# Patient Record
Sex: Female | Born: 1935 | Race: White | Hispanic: No | Smoking: Never smoker
Health system: Southern US, Community
[De-identification: ages and names within clinical notes are randomized; demographics above are authoritative.]

## PROBLEM LIST (undated history)

## (undated) DIAGNOSIS — N189 Chronic kidney disease, unspecified: Secondary | ICD-10-CM

## (undated) DIAGNOSIS — G629 Polyneuropathy, unspecified: Secondary | ICD-10-CM

## (undated) DIAGNOSIS — R413 Other amnesia: Secondary | ICD-10-CM

## (undated) DIAGNOSIS — I739 Peripheral vascular disease, unspecified: Secondary | ICD-10-CM

## (undated) DIAGNOSIS — I509 Heart failure, unspecified: Secondary | ICD-10-CM

## (undated) DIAGNOSIS — I4891 Unspecified atrial fibrillation: Secondary | ICD-10-CM

## (undated) DIAGNOSIS — J449 Chronic obstructive pulmonary disease, unspecified: Secondary | ICD-10-CM

## (undated) DIAGNOSIS — F329 Major depressive disorder, single episode, unspecified: Secondary | ICD-10-CM

## (undated) DIAGNOSIS — K219 Gastro-esophageal reflux disease without esophagitis: Secondary | ICD-10-CM

## (undated) DIAGNOSIS — R569 Unspecified convulsions: Secondary | ICD-10-CM

## (undated) DIAGNOSIS — M1990 Unspecified osteoarthritis, unspecified site: Secondary | ICD-10-CM

## (undated) DIAGNOSIS — I639 Cerebral infarction, unspecified: Secondary | ICD-10-CM

## (undated) DIAGNOSIS — F419 Anxiety disorder, unspecified: Secondary | ICD-10-CM

## (undated) DIAGNOSIS — W19XXXA Unspecified fall, initial encounter: Secondary | ICD-10-CM

## (undated) DIAGNOSIS — I10 Essential (primary) hypertension: Secondary | ICD-10-CM

## (undated) DIAGNOSIS — I959 Hypotension, unspecified: Secondary | ICD-10-CM

## (undated) DIAGNOSIS — N390 Urinary tract infection, site not specified: Secondary | ICD-10-CM

## (undated) DIAGNOSIS — E785 Hyperlipidemia, unspecified: Secondary | ICD-10-CM

## (undated) HISTORY — DX: Cerebral infarction, unspecified: I63.9

## (undated) HISTORY — PX: TONSILLECTOMY: SUR1361

## (undated) HISTORY — DX: Anxiety disorder, unspecified: F41.9

## (undated) HISTORY — DX: Polyneuropathy, unspecified: G62.9

## (undated) HISTORY — PX: FOOT NEUROMA SURGERY: SHX646

## (undated) HISTORY — PX: ABDOMINAL HYSTERECTOMY: SHX81

## (undated) HISTORY — DX: Essential (primary) hypertension: I10

## (undated) HISTORY — DX: Hyperlipidemia, unspecified: E78.5

## (undated) HISTORY — DX: Unspecified fall, initial encounter: W19.XXXA

## (undated) HISTORY — PX: SPINE SURGERY: SHX786

## (undated) HISTORY — DX: Hypotension, unspecified: I95.9

## (undated) HISTORY — DX: Urinary tract infection, site not specified: N39.0

## (undated) HISTORY — DX: Peripheral vascular disease, unspecified: I73.9

---

## 2015-05-26 LAB — BASIC METABOLIC PANEL
BUN: 45 mg/dL — AB (ref 4–21)
Creatinine: 2.4 mg/dL — AB (ref 0.5–1.1)
Glucose: 96 mg/dL
Potassium: 4.2 mmol/L (ref 3.4–5.3)
Sodium: 128 mmol/L — AB (ref 137–147)

## 2015-05-26 LAB — CBC AND DIFFERENTIAL
HCT: 29 % — AB (ref 36–46)
HEMOGLOBIN: 9.7 g/dL — AB (ref 12.0–16.0)
Neutrophils Absolute: 9 /uL
PLATELETS: 162 10*3/uL (ref 150–399)
WBC: 9.9 10^3/mL

## 2015-05-26 LAB — HEPATIC FUNCTION PANEL
ALT: 15 U/L (ref 7–35)
AST: 29 U/L (ref 13–35)
Alkaline Phosphatase: 99 U/L (ref 25–125)
Bilirubin, Total: 0.2 mg/dL

## 2015-05-26 LAB — POCT INR: INR: 1.1 (ref 0.9–1.1)

## 2015-08-04 ENCOUNTER — Non-Acute Institutional Stay (SKILLED_NURSING_FACILITY): Payer: Medicare Other | Admitting: Internal Medicine

## 2015-08-04 ENCOUNTER — Encounter: Payer: Self-pay | Admitting: Internal Medicine

## 2015-08-04 DIAGNOSIS — I480 Paroxysmal atrial fibrillation: Secondary | ICD-10-CM | POA: Insufficient documentation

## 2015-08-04 DIAGNOSIS — R1310 Dysphagia, unspecified: Secondary | ICD-10-CM | POA: Diagnosis not present

## 2015-08-04 DIAGNOSIS — R5381 Other malaise: Secondary | ICD-10-CM | POA: Diagnosis not present

## 2015-08-04 DIAGNOSIS — E46 Unspecified protein-calorie malnutrition: Secondary | ICD-10-CM

## 2015-08-04 DIAGNOSIS — K219 Gastro-esophageal reflux disease without esophagitis: Secondary | ICD-10-CM | POA: Diagnosis not present

## 2015-08-04 DIAGNOSIS — N289 Disorder of kidney and ureter, unspecified: Secondary | ICD-10-CM

## 2015-08-04 DIAGNOSIS — E785 Hyperlipidemia, unspecified: Secondary | ICD-10-CM | POA: Diagnosis not present

## 2015-08-04 DIAGNOSIS — I4891 Unspecified atrial fibrillation: Secondary | ICD-10-CM

## 2015-08-04 DIAGNOSIS — G8194 Hemiplegia, unspecified affecting left nondominant side: Secondary | ICD-10-CM | POA: Diagnosis not present

## 2015-08-04 DIAGNOSIS — M1712 Unilateral primary osteoarthritis, left knee: Secondary | ICD-10-CM

## 2015-08-04 DIAGNOSIS — I639 Cerebral infarction, unspecified: Secondary | ICD-10-CM

## 2015-08-04 DIAGNOSIS — I739 Peripheral vascular disease, unspecified: Secondary | ICD-10-CM

## 2015-08-04 DIAGNOSIS — E871 Hypo-osmolality and hyponatremia: Secondary | ICD-10-CM | POA: Diagnosis not present

## 2015-08-04 NOTE — Progress Notes (Signed)
LOCATION: [facility]  PCP: No primary care provider on file.   Code Status: DNR  Goals of care: Advanced Directive information Advanced Directives 08/04/2015  Does patient have an advance directive? Yes  Type of Advance Directive Out of facility DNR (pink MOST or yellow form)  Does patient want to make changes to advanced directive? No - Patient declined  Copy of advanced directive(s) in chart? Yes     Chief Complaint  Patient presents with  . New Admit To SNF    New Admission     HPI:  Patient is a 82 y.o. female seen today for short term rehabilitation post hospital admission and rehabilitation stay in [place] after an acute CVA with left sided hemiparesis. She has PMH of CAD, afib, HTN, right knee OA, PVD among others. She is seen in her room today with her daughter present. She complaints of being weak and tired.   Review of Systems:  Constitutional: Negative for fever, chills, diaphoresis. Energy level is slowly coming back. HENT: Negative for headache, congestion, nasal discharge, sore throat, difficulty swallowing. Wears hearing aids.   Eyes: Negative for blurred vision, double vision and discharge. Wears glasses. Respiratory: Negative for cough, shortness of breath and wheezing.   Cardiovascular: Negative for chest pain, palpitations, leg swelling.  Gastrointestinal: Negative for heartburn, nausea, vomiting, abdominal pain, melena, diarrhea and constipation. Last bowel movement was yesterday.  Genitourinary: Negative for dysuria and flank pain.  Musculoskeletal: Negative for back pain, fall in the facility. had falls in the other SNF during her stay.  Skin: Negative for itching, rash.  Neurological: Negative for dizziness. Psychiatric/Behavioral: Negative for depression   Past Medical History  Diagnosis Date  . Neuropathy ([place])   . HTN (hypertension)   . HLD (hyperlipidemia)   . PVD (peripheral vascular disease) ([place])   . Anxiety    History reviewed. No  pertinent past surgical history. Social History:   has no tobacco, alcohol, and drug history on file.  History reviewed. No pertinent family history.  Medications:   Medication List       This list is accurate as of: 08/04/15  1:24 PM.  Always use your most recent med list.               acetaminophen 325 MG tablet  Commonly known as:  TYLENOL  Take 650 mg by mouth every 4 (four) hours as needed for mild pain.     aspirin 81 MG chewable tablet  Chew 81 mg by mouth daily.     atorvastatin 80 MG tablet  Commonly known as:  LIPITOR  Take 80 mg by mouth daily.     carvedilol 12.5 MG tablet  Commonly known as:  COREG  Take 12.5 mg by mouth every 12 (twelve) hours.     digoxin 0.125 MG tablet  Commonly known as:  LANOXIN  Take 0.125 mg by mouth daily.     diltiazem 60 MG tablet  Commonly known as:  CARDIZEM  Take 60 mg by mouth every 8 (eight) hours.     doxazosin 1 MG tablet  Commonly known as:  CARDURA  Take 1 mg by mouth at bedtime.     famotidine 20 MG tablet  Commonly known as:  PEPCID  Take 20 mg by mouth every 12 (twelve) hours.        Immunizations: Immunization History  Administered Date(s) Administered  . PPD Test 08/01/2015     Physical Exam: Filed Vitals:   08/04/15 1309  BP: 136/74  Pulse: 70  Temp: 97.8 F (36.6 C)  TempSrc: Oral  Resp: 18  Weight: 112 lb (50.803 kg)  SpO2: 98%    General- elderly female, thin built, in no acute distress Head- normocephalic, atraumatic, hearing aids present Nose- no maxillary or frontal sinus tenderness, no nasal discharge Throat- moist mucus membrane Eyes- PERRLA, EOMI, no pallor, no icterus, no discharge, normal conjunctiva, normal sclera Neck- no cervical lymphadenopathy Cardiovascular- normal s1,s2, + murmur, trace leg edema Respiratory- bilateral clear to auscultation, no wheeze, no rhonchi, no crackles, no use of accessory muscles Abdomen- bowel sounds present, soft, non  tender Musculoskeletal- able to move all 4 extremities, left sided weakness more prominent in LUE > LLE, left knee OA with limited left knee ROM Neurological-  alert and oriented to person, place and time, left foot drop Skin- warm and dry Psychiatry- normal mood and affect    Labs reviewed: Basic Metabolic Panel:  Recent Labs  05/26/15  NA 128*  K 4.2  BUN 45*  CREATININE 2.4*   Liver Function Tests:  Recent Labs  05/26/15  AST 29  ALT 15  ALKPHOS 99   No results for input(s): LIPASE, AMYLASE in the last 8760 hours. No results for input(s): AMMONIA in the last 8760 hours. CBC:  Recent Labs  05/26/15  WBC 9.9  NEUTROABS 9  HGB 9.7*  HCT 29*  PLT 162    Radiological Exams: Ct Head Wo Contrast 05/27/15 Impression. Questionable area of low attenuation involving the territory of the left PCA. Recommended MRI for further evaluation. The possibility of early changes of ischemia could not be completely excluded. Examination severely compromised by motion and beam hardening artifact. Severe microvascular disease.  Xr Mobile Chest 05/27/15 Impression. There is an improved ad subtle remaining at the lower right chest. The findings are consistent with chronic rotator cuff tear at the right shoulder and secondary bone remodeling and osteophytic change.    Assessment/Plan  Physical deconditioning Will have her work with physical therapy and occupational therapy team to help with gait training and muscle strengthening exercises.fall precautions. Skin care. Encourage to be out of bed.   Acute CVA with left sided hemiparesis Persists but improving per family, Will have patient work with PT/OT as tolerated to regain strength and restore function.  Fall precautions are in place. Continue aspirin 81 mg daily and atorvastatin 80 mg daily. Get neurology follow up  HLD Check lipid panel, continue atorvastatin 80 mg daily  left knee OA Severe OA. Get orthopedic consult. D/c current  tylenol order. Start tylenol 500 mg 2 tab bid and monitor. Will need AFO brace for right knee as noted in her OV note.   Hyponatremia Monitor bmp  Protein calorie malnutrition Get dietary consult for now, monitor weekly weight  Dysphagia Get SLP consult, to provide assistance with meals  afib Rate controlled. Continue diltiazem 60 mg tid, carvedilol 12.5 mg bid and digoxin 125 mcg daily, check digoxin level  gerd Stable, change famotidine to 20 mg daily from bid  HTN Stable bp, continue carvedilol and doxazosin, monitor BP and check bmp  Impaired renal function Monitor bmp, off losartan at present  PVD Was on petoxifylline before, unclear reason for stopping her med, will need medical records from SNF to evaluate further    Goals of care: short term rehabilitation   Labs/tests ordered: cbc, cmp, tsh, lipid panel, digoxin level  Family/ staff Communication: reviewed care plan with patient, her daughter and nursing supervisor  Blanchie Serve, MD Internal Medicine [facility] [address] [place], [place] [address] Cell Phone (Monday-Friday 8 am - 5 pm): [phone] On Call: [phone] and follow prompts after 5 pm and on weekends Office Phone: [phone] Office Fax: [phone]

## 2015-08-11 ENCOUNTER — Non-Acute Institutional Stay (SKILLED_NURSING_FACILITY): Payer: Medicare Other | Admitting: Internal Medicine

## 2015-08-11 ENCOUNTER — Encounter: Payer: Self-pay | Admitting: Internal Medicine

## 2015-08-11 DIAGNOSIS — L600 Ingrowing nail: Secondary | ICD-10-CM

## 2015-08-11 DIAGNOSIS — M2042 Other hammer toe(s) (acquired), left foot: Secondary | ICD-10-CM | POA: Diagnosis not present

## 2015-08-11 DIAGNOSIS — M2041 Other hammer toe(s) (acquired), right foot: Secondary | ICD-10-CM | POA: Diagnosis not present

## 2015-08-11 DIAGNOSIS — L602 Onychogryphosis: Secondary | ICD-10-CM | POA: Diagnosis not present

## 2015-08-11 NOTE — Progress Notes (Signed)
LOCATION: [facility]  PCP: No primary care provider on file.   Code Status: DNR  Goals of care: Advanced Directive information Advanced Directives 08/04/2015  Does patient have an advance directive? Yes  Type of Advance Directive Out of facility DNR (pink MOST or yellow form)  Does patient want to make changes to advanced directive? No - Patient declined  Copy of advanced directive(s) in chart? Yes     Chief Complaint  Patient presents with  . Acute Visit    Foot concerns per daughter     HPI:  Patient is a 82 y.o. female seen today for acute visit. Her daughter would like her feet evaluated as she is complaining of pain to her toe. She was seeing a podiatrist in [place]. She is here for short term rehabilitation post hospital admission and rehabilitation stay in [place] after an acute CVA with left sided hemiparesis.   Review of Systems:  Constitutional: Negative for fever HENT: Negative for headache, congestion, nasal discharge Respiratory: Negative for cough, shortness of breath Cardiovascular: Negative for chest pain Musculoskeletal: Negative for fall in the facility. has PVD  Skin: Negative for itching, rash.  Neurological: Negative for numbness or tingling to her feet   Past Medical History  Diagnosis Date  . Neuropathy ([place])   . HTN (hypertension)   . HLD (hyperlipidemia)   . PVD (peripheral vascular disease) ([place])   . Anxiety     Medications:   Medication List       This list is accurate as of: 08/11/15  3:43 PM.  Always use your most recent med list.               acetaminophen 500 MG tablet  Commonly known as:  TYLENOL  Take 1,000 mg by mouth 2 (two) times daily.     aspirin 81 MG chewable tablet  Chew 81 mg by mouth daily.     atorvastatin 80 MG tablet  Commonly known as:  LIPITOR  Take 80 mg by mouth daily.     BIOFREEZE 4 % Gel  Generic drug:  Menthol (Topical Analgesic)  Apply 1 application topically 2 (two) times daily. Left  knee for pain     carvedilol 12.5 MG tablet  Commonly known as:  COREG  Take 12.5 mg by mouth every 12 (twelve) hours.     digoxin 0.125 MG tablet  Commonly known as:  LANOXIN  Take 0.125 mg by mouth daily.     diltiazem 60 MG tablet  Commonly known as:  CARDIZEM  Take 60 mg by mouth every 8 (eight) hours.     doxazosin 1 MG tablet  Commonly known as:  CARDURA  Take 1 mg by mouth at bedtime.     famotidine 20 MG tablet  Commonly known as:  PEPCID  Take 20 mg by mouth daily.     gabapentin 600 MG tablet  Commonly known as:  NEURONTIN  Take 600 mg by mouth daily. 600 mg 2 tabs= 1200 mg in the evening     pentoxifylline 400 MG CR tablet  Commonly known as:  TRENTAL  Take 400 mg by mouth 2 (two) times daily.         Physical Exam: Filed Vitals:   08/11/15 1537  BP: 124/50  Pulse: 60  Temp: 96.9 F (36.1 C)  TempSrc: Oral  Resp: 18  Weight: 112 lb (50.803 kg)  SpO2: 97%    General- elderly female, thin built, in no acute distress Neck-  no cervical lymphadenopathy Cardiovascular- normal s1,s2, + murmur, trace leg edema Respiratory- bilateral clear to auscultation, no wheeze, no rhonchi, no crackles, no use of accessory muscles Musculoskeletal- able to move all 4 extremities, left sided weakness more prominent in LUE > LLE, left knee OA with limited left knee ROM Neurological-  alert and oriented to person, place and time, left foot drop with brace + Skin- warm and dry Toes- has ingrown toe nail to right great toe and has thickened toe nails. Has hammer toes Psychiatry- normal mood and affect    Labs reviewed: Basic Metabolic Panel:  Recent Labs  05/26/15  NA 128*  K 4.2  BUN 45*  CREATININE 2.4*   Liver Function Tests:  Recent Labs  05/26/15  AST 29  ALT 15  ALKPHOS 99   No results for input(s): LIPASE, AMYLASE in the last 8760 hours. No results for input(s): AMMONIA in the last 8760 hours. CBC:  Recent Labs  05/26/15  WBC 9.9  NEUTROABS 9   HGB 9.7*  HCT 29*  PLT 162    Radiological Exams: Ct Head Wo Contrast 05/27/15 Impression. Questionable area of low attenuation involving the territory of the left PCA. Recommended MRI for further evaluation. The possibility of early changes of ischemia could not be completely excluded. Examination severely compromised by motion and beam hardening artifact. Severe microvascular disease.  Xr Mobile Chest 05/27/15 Impression. There is an improved ad subtle remaining at the lower right chest. The findings are consistent with chronic rotator cuff tear at the right shoulder and secondary bone remodeling and osteophytic change.    Assessment/Plan  Hammer toes Get podiatry consult  Ingrown toe nail No signs of infection at present, get podiatry consult  Hypertrophied toe nails Get podiatry consult for foot care   Family/ staff Communication: reviewed care plan with patient and nursing supervisor    Blanchie Serve, MD Internal Medicine [place], [place] [address] Cell Phone (Monday-Friday 8 am - 5 pm): [phone] On Call: [phone] and follow prompts after 5 pm and on weekends Office Phone: [phone] Office Fax: [phone]

## 2015-08-14 ENCOUNTER — Encounter: Payer: Self-pay | Admitting: Neurology

## 2015-08-14 ENCOUNTER — Ambulatory Visit (INDEPENDENT_AMBULATORY_CARE_PROVIDER_SITE_OTHER): Payer: Medicare Other | Admitting: Neurology

## 2015-08-14 VITALS — BP 169/64 | HR 52 | Temp 99.4°F | Ht 60.0 in

## 2015-08-14 DIAGNOSIS — I639 Cerebral infarction, unspecified: Secondary | ICD-10-CM

## 2015-08-14 DIAGNOSIS — G629 Polyneuropathy, unspecified: Secondary | ICD-10-CM | POA: Diagnosis not present

## 2015-08-14 NOTE — Patient Instructions (Signed)
As far as your medications are concerned, I would like to suggest: Increase night dose of neurontin to 900mg   Our phone number is [phone]. We also have an after hours call service for urgent matters and there is a physician on-call for urgent questions. For any emergencies you know to call 911 or go to the nearest emergency room

## 2015-08-14 NOTE — Progress Notes (Signed)
[facility]    Provider:  Dr Jaynee Eagles Referring Provider: No ref. provider found Primary Care Physician:  Blanchie Serve, MD  CC:  Previous CVA  HPI:  [facility] is a 82 y.o. female here as a referral for remote CVA. PMHx CVA, HTN, Left hemiparesis, A-fib, CAD, PN and PVD. On ASA 81mg  sequelae from stroke includes left hemiparesis, contractures, left foot drop, weakness. Stroke was 2 months ago, went to the ED in [facility], for dizziness, She was admitted and then had a stoke that evening. She was given TPA. She moved here to a nursing facility to be close to her daughters. She was in the hospital for 2 weeks in serious condition. She couldn't move anything on the left but the left side is improving. She also had a facial droop. I don't have any details from her hospitalization, no details on the stroke but will request all records. She is in therapy. She can't walk, she can stand with assistance. No swallowing problems anymore, no aphasia or dysarthria. She forgets there are things on the left, possibly some neglect. No new sensory changes with the stroke. She has had terrible neuropathy in the feet for many years unclear diagnosis or why. She has been having memory problems more short-term memory and reasoning since the stroke. She is incontinent but this is not new. She used to be on Eliquis, She is unsure when she stopped it or when or why she was on it or when she was diagnosed with afib. Need records. Daughter is here with her and provides most information. Daughter went to the [facility] enrichment and then lived in the [place] area but now he is hearing [facility] and patient is here to be near her daughter. Her neuropathy is most painful at night while she is in bed.  Reviewed notes, labs and imaging from outside physicians, which showed: Reviewed records provided by nursing facility. Patient is on aspirin 81 mg, no anticoagulation is being given to her for her atrial  fibrillation. She is on gabapentin 600 mg for neuropathy. Patient has hemiplegia and hemiparesis following unspecified cerebrovascular disease affecting left nondominant side. She was hospitalized for acute CVA given TPA. Has left hemiparesis, contracture management, left foot drop, needs rehabilitation for strengthening mobility, monitor for pain and muscle spasms, on ASA 81 mg by mouth daily.  Review of Systems: Patient complains of symptoms per HPI as well as the following symptoms: Hearing loss, incontinence, joint pain, feeling cold, itching, incontinence. Pertinent negatives per HPI. All others negative.   Social History   Social History  . Marital Status: Widowed    Spouse Name: N/A  . Number of Children: 4  . Years of Education: 12   Occupational History  . Retired    Social History Main Topics  . Smoking status: Not on file  . Smokeless tobacco: Not on file  . Alcohol Use: Not on file  . Drug Use: Not on file  . Sexual Activity: Not on file   Other Topics Concern  . Not on file   Social History Narrative   Lives at [facility]   Caffeine use: 1 cup tea/day   1 cup coffee/day    Family History  Problem Relation Age of Onset  . Heart attack Brother   . Stroke Neg Hx   . Neuropathy Neg Hx     Past Medical History  Diagnosis Date  . Neuropathy ([place])   . HTN (hypertension)   . HLD (  hyperlipidemia)   . PVD (peripheral vascular disease) ([place])   . Anxiety   . Stroke [facility])     No past surgical history on file.  Current Outpatient Prescriptions  Medication Sig Dispense Refill  . acetaminophen (TYLENOL) 500 MG tablet Take 500 mg by mouth 2 (two) times daily. Left knee    . aspirin 81 MG chewable tablet Chew 81 mg by mouth daily. Chewable tablet    . atorvastatin (LIPITOR) 80 MG tablet Take 80 mg by mouth daily.    . carvedilol (COREG) 12.5 MG tablet Take 12.5 mg by mouth every 12 (twelve) hours.    . digoxin (LANOXIN) 0.125 MG tablet Take 0.125 mg by  mouth daily.    Marland Kitchen diltiazem (CARDIZEM) 60 MG tablet Take 60 mg by mouth every 8 (eight) hours.    Marland Kitchen doxazosin (CARDURA) 1 MG tablet Take 1 mg by mouth at bedtime.    . famotidine (PEPCID) 20 MG tablet Take 20 mg by mouth daily.     Marland Kitchen gabapentin (NEURONTIN) 600 MG tablet Take 600 mg by mouth daily. 1 tablet in am for neuropathy 2 tablets in the pm    . Menthol, Topical Analgesic, (BIOFREEZE) 4 % GEL Apply 1 application topically 2 (two) times daily. Left knee for pain    . pentoxifylline (TRENTAL) 400 MG CR tablet Take 400 mg by mouth 2 (two) times daily.     No current facility-administered medications for this visit.    Allergies as of 08/14/2015  . (No Known Allergies)    Vitals: BP 169/64 mmHg  Pulse 52  Temp(Src) 99.4 F (37.4 C) (Oral)  Ht 5' (1.524 m) Last Weight:  Wt Readings from Last 1 Encounters:  08/11/15 112 lb (50.803 kg)   Last Height:   Ht Readings from Last 1 Encounters:  08/14/15 5' (1.524 m)   Physical exam: Exam: Gen: NAD, conversant, well nourised                    CV: RRR, no MRG. No Carotid Bruits. No peripheral edema, warm, nontender Eyes: Conjunctivae clear without exudates or hemorrhage  Neuro: Detailed Neurologic Exam  Speech:    Speech is normal; fluent and spontaneous with normal comprehension.  Cognition:    The patient is oriented to person, place, and time;     recent and remote memory Impaired;     language fluent;     Impaired attention, concentration,     fund of knowledge impaired Cranial Nerves:    The pupils are equal, round, and reactive to light.  Attempted funduscopic exam could not visualize due to small pupils. Visual fields are full to finger confrontation. Extraocular movements are intact. Trigeminal sensation is impaired on the left and the muscles of mastication are normal. Lower left facial weakness. The palate elevates in the midline. Hearing impaired. Voice is normal. Shoulder shrug is normal. The tongue has normal  motion without fasciculations.   Coordination: Impaired with the left arm and left leg. Intact on the right. Dec fine motor on the left esp left hand.   Gait:    Can stand briefly with assistance.   Motor Observation:    No asymmetry, no atrophy, and no involuntary movements noted. Tone:    Normal muscle tone.  No spasticity  Posture:    Posture stopped in the wheelachair.     Strength: Left pronator drift. Left hemi[paresiss that is not too different from the right side. Right is 4-4+/4 and the left  side is 3+-4/5.        Sensation: Left hemiparesis and significant distal dec in sensation due to polyneuropathy.      Reflex Exam:  DTR's:    Deep tendon reflexes in the upper and lower extremities are brisk for age and medical conditions, left greater than the right .   Toes:    The toes are downgoing bilaterally.   Clonus:    Clonus is absent.       Assessment/Plan:  82 y.o. female here as a referral for remote CVA. PMHx CVA, HTN, Left hemiparesis, A-fib, CAD, PN and PVD. On ASA 81mg  sequelae from stroke includes left hemiparesis, contractures, left foot drop, weakness. Stroke was 2 months ago. Unfortunately have a very limited information on her stroke, and it have any imaging reports, no hospital records, no idea when she was on eloquent is for her atrial fibrillation, if she was taken off of it or if she was supposed to restart it. Daughter doesn't have much information either, stroke happened in [place] and her brother is the one who lives there. I'll need to request all records from [facility] before making any suggestions or modifications to her medications. She also has severe distal peripheral neuropathy, patient has no idea if she is ever been worked up for it or why she has it. No edema but I can do at this point is increase her Neurontin to 900 mg daily at bedtime.  - Increased Neurontin to 900 mg daily at bedtime for painful peripheral neuropathy - I have limited  information on her stroke and atrial fibrillation, need to request all notes from [facility] hospitalization including imaging reports and hospital notes as well as her cardiologist records. - I have no information on her apparently severe distal neuropathy, at this point we will request records from her previous internist in [place], [facility] [address] [place], [place] [address]  Phone [phone] Fax [phone]  A total of 45 minutes was spent in with this patient and daughter face-to-face. Over half this time was spent on counseling patient on the stroke and neuropathy diagnosis and different therapeutic options available.

## 2015-08-16 ENCOUNTER — Encounter: Payer: Self-pay | Admitting: Neurology

## 2015-08-18 ENCOUNTER — Encounter: Payer: Self-pay | Admitting: *Deleted

## 2015-08-18 NOTE — Progress Notes (Signed)
Faxed Dr Cathren Laine recent office visit note to [facility]. Fax [phone]. Received fax confirmation.

## 2015-08-21 ENCOUNTER — Non-Acute Institutional Stay (SKILLED_NURSING_FACILITY): Payer: Medicare Other | Admitting: Family

## 2015-08-21 ENCOUNTER — Encounter: Payer: Self-pay | Admitting: Family

## 2015-08-21 DIAGNOSIS — E785 Hyperlipidemia, unspecified: Secondary | ICD-10-CM | POA: Diagnosis not present

## 2015-08-21 DIAGNOSIS — M25562 Pain in left knee: Secondary | ICD-10-CM

## 2015-08-21 DIAGNOSIS — I482 Chronic atrial fibrillation, unspecified: Secondary | ICD-10-CM

## 2015-08-21 DIAGNOSIS — K219 Gastro-esophageal reflux disease without esophagitis: Secondary | ICD-10-CM | POA: Diagnosis not present

## 2015-08-21 DIAGNOSIS — I10 Essential (primary) hypertension: Secondary | ICD-10-CM | POA: Insufficient documentation

## 2015-08-21 DIAGNOSIS — M1712 Unilateral primary osteoarthritis, left knee: Secondary | ICD-10-CM

## 2015-08-21 DIAGNOSIS — G8194 Hemiplegia, unspecified affecting left nondominant side: Secondary | ICD-10-CM

## 2015-08-21 DIAGNOSIS — R269 Unspecified abnormalities of gait and mobility: Secondary | ICD-10-CM

## 2015-08-21 DIAGNOSIS — I739 Peripheral vascular disease, unspecified: Secondary | ICD-10-CM

## 2015-08-21 DIAGNOSIS — G8929 Other chronic pain: Secondary | ICD-10-CM

## 2015-08-21 NOTE — Progress Notes (Addendum)
Location:    [facility] Room Number: [phone] Place of Service:  SNF [phone])  Provider: Marlowe Sax FNP-C  PCP: Blanchie Serve, MD Patient Care Team: Blanchie Serve, MD as PCP - General (Internal Medicine)  Extended Emergency Contact Information Primary Emergency Contact: [name]  [place] Phone: [phone] Relation: Daughter Secondary Emergency Contact: Reather L[place] Phone: [phone] Relation: Son  Code Status: DNR Goals of care:  Advanced Directive information Advanced Directives 08/21/2015  Does patient have an advance directive? Yes  Type of Advance Directive Out of facility DNR (pink MOST or yellow form)  Does patient want to make changes to advanced directive? No - Patient declined  Copy of advanced directive(s) in chart? Yes     No Known Allergies  Chief Complaint  Patient presents with  . Discharge Note    HPI:  82 y.o. female seen today at  [facility] for discharge to ALF. She was here for short term rehabilitation post hospital admission and rehabilitation stay in [place] after an acute CVA with left sided hemiparesis. She has a medical history of  HTN,CAD, Hyperlipidemia, Afib, right knee OA, PVD, Peripheral Neuropathy  among others.She is seen in her room today with her daughter at bedside. She states no acute issues this visit except for her chronic burning, tingling and numbness of her feet. She has worked with PT/OT now stable for discharge but requires assistance due to hemiparesis.She will be discharged to ALF for assistance with ADL's and safety precautions. She requires DME hemi Wheelchair with removable arm rests, seat cushion, extended brake handles and anti-tippers to allow her to maintain current level of independence with ADL's.She will also require a Hospital bed with 1/2 rails to assist with elevating lower extremities and repositioning in bed due  to knee pain secondary to osteoarthritis.  DME will be be arranged by facility social worker prior to discharge. She will be discharge with Prescription medication from facility.Facility staff report no new concerns.      Past Medical History  Diagnosis Date  . Neuropathy ([place])   . HTN (hypertension)   . HLD (hyperlipidemia)   . PVD (peripheral vascular disease) ([place])   . Anxiety   . Stroke [facility])     History reviewed. No pertinent past surgical history.    reports that she has never smoked. She does not have any smokeless tobacco history on file. Her alcohol and drug histories are not on file. Social History   Social History  . Marital Status: Widowed    Spouse Name: N/A  . Number of Children: 4  . Years of Education: 12   Occupational History  . Retired    Social History Main Topics  . Smoking status: Never Smoker   . Smokeless tobacco: Not on file  . Alcohol Use: Not on file  . Drug Use: Not on file  . Sexual Activity: Not on file   Other Topics Concern  . Not on file   Social History Narrative   Lives at [facility] and rehab (Fax: [phone])   Caffeine use: 1 cup tea/day   1 cup coffee/day   Functional Status Survey:    No Known Allergies  Pertinent  Health Maintenance Due  Topic Date Due  . DEXA SCAN  02/06/1996  . PNA vac Low Risk Adult (1 of 2 - PCV13) 02/06/1996  . INFLUENZA VACCINE  09/23/2015    Medications:  Medication List       This list is accurate as of: 08/21/15 12:09 PM.  Always use your most recent med list.               acetaminophen 500 MG tablet  Commonly known as:  TYLENOL  Take 500 mg by mouth 2 (two) times daily. Left knee     aspirin 81 MG chewable tablet  Chew 81 mg by mouth daily. Chewable tablet     atorvastatin 80 MG tablet  Commonly known as:  LIPITOR  Take 80 mg by mouth daily.     BIOFREEZE 4 % Gel  Generic drug:  Menthol (Topical Analgesic)  Apply 1 application topically 2 (two) times daily. Left knee  for pain     carvedilol 12.5 MG tablet  Commonly known as:  COREG  Take 12.5 mg by mouth every 12 (twelve) hours.     digoxin 0.125 MG tablet  Commonly known as:  LANOXIN  Take 0.125 mg by mouth daily.     diltiazem 60 MG tablet  Commonly known as:  CARDIZEM  Take 60 mg by mouth every 8 (eight) hours.     doxazosin 1 MG tablet  Commonly known as:  CARDURA  Take 1 mg by mouth at bedtime.     famotidine 20 MG tablet  Commonly known as:  PEPCID  Take 20 mg by mouth daily.     gabapentin 600 MG tablet  Commonly known as:  NEURONTIN  Take 600 mg by mouth daily. 1 tablet in am for neuropathy 2 tablets in the pm     pentoxifylline 400 MG CR tablet  Commonly known as:  TRENTAL  Take 400 mg by mouth 2 (two) times daily.        Review of Systems  Constitutional: Negative for fever, chills, activity change and appetite change.  HENT: Negative for congestion, rhinorrhea, sinus pressure, sneezing and sore throat.   Eyes: Negative.   Respiratory: Negative for cough, chest tightness, shortness of breath and wheezing.   Cardiovascular: Negative for chest pain, palpitations and leg swelling.  Gastrointestinal: Negative for nausea, vomiting, abdominal pain, diarrhea, constipation and abdominal distention.  Endocrine: Negative.   Genitourinary: Negative for dysuria, urgency, frequency and flank pain.  Musculoskeletal: Positive for gait problem.       Knee pain   Skin: Negative.   Neurological: Negative for dizziness, seizures, light-headedness and headaches.       Left side weakness   Psychiatric/Behavioral: Negative for hallucinations, confusion, sleep disturbance and agitation.    Filed Vitals:   08/21/15 1207  BP: 129/47  Pulse: 61  Temp: 97.1 F (36.2 C)  Resp: 20  Height: 5' (1.524 m)  Weight: 112 lb (50.803 kg)  SpO2: 97%   Body mass index is 21.87 kg/(m^2). Physical Exam  Constitutional: She appears well-developed and well-nourished. No distress.  HENT:  Head:  Normocephalic.  Mouth/Throat: Oropharynx is clear and moist. No oropharyngeal exudate.  Eyes: Conjunctivae and EOM are normal. Pupils are equal, round, and reactive to light. Right eye exhibits no discharge. Left eye exhibits no discharge. No scleral icterus.  Neck: Normal range of motion. No JVD present. No thyromegaly present.  Cardiovascular: Normal rate and intact distal pulses.  Exam reveals no gallop and no friction rub.   Murmur heard. Pulmonary/Chest: Effort normal and breath sounds normal. No respiratory distress. She has no wheezes. She has no rales.  Abdominal: Soft. Bowel sounds are normal. She exhibits no distension. There is no  tenderness. There is no rebound and no guarding.  Musculoskeletal: She exhibits no tenderness.  Left side weakness. Limited ROM to left knee due to pain. Left AFO in place.   Lymphadenopathy:    She has no cervical adenopathy.  Neurological: She is alert.  Skin: Skin is warm and dry. No rash noted. No erythema. No pallor.  Psychiatric: She has a normal mood and affect.    Labs reviewed: Basic Metabolic Panel:  Recent Labs  05/26/15  NA 128*  K 4.2  BUN 45*  CREATININE 2.4*   Liver Function Tests:  Recent Labs  05/26/15  AST 29  ALT 15  ALKPHOS 99   No results for input(s): LIPASE, AMYLASE in the last 8760 hours. No results for input(s): AMMONIA in the last 8760 hours. CBC:  Recent Labs  05/26/15  WBC 9.9  NEUTROABS 9  HGB 9.7*  HCT 29*  PLT 162   Cardiac Enzymes: No results for input(s): CKTOTAL, CKMB, CKMBINDEX, TROPONINI in the last 8760 hours. BNP: Invalid input(s): POCBNP CBG: No results for input(s): GLUCAP in the last 8760 hours.  Procedures and Imaging Studies During Stay: No results found.  Assessment/Plan:   Left Hemiparesis Status post short term rehabilitation post hospital admission 05/26/2015-06/11/2015 and rehabilitation stay in [place] after an acute CVA with left sided hemiparesis. She has worked with PT/OT  for muscle strengthening, ROM and Exercise.Continue on ASA 81 mg Tablet and Atorvastatin 80 mg Tablet. Fall and safety precautions. Continue to Assist with ADL's. She will discharge to ALF for assistance and safety precautions.   PVD Pentoxifylline 400 mg CR Tablet recently initiated 08/08/2015. Continue to monitor.   Afib Continue on Digoxin 125 mcg tablet.   GERD Stable. Continue on famotidine 20 mg tablet  OA Worse on left knee. Continue on Biofreeze 4 % gel twice daily and Tylenol. Seen by Sinclair Grooms 08/18/2015 follow up PRN   Hyperlipidemia Continue on Atorvastatin 80 mg Tablet. Monitor Lipid panel.   HTN B/p stable. Continue diltiazem 60 mg tablet and Coreg 12.5 mg tablet.   Abnormal Gait  She has worked with PT/OT for muscle strengthening, ROM and Exercise.Ordering DME hemi Wheelchair with removable arm rests, seat cushion, extended brake handles and anti-tippers to allow her to maintain current level of independence with ADL's.   Left Knee pain  Continue with Tylenol PRN and Biofreeze 4 % gel. Hospital bed with 1/2 rails to assist with elevating lower extremities and repositioning in bed due to knee pain secondary to osteoarthritis.   Patient is being discharged with the following durable medical equipment:   Hemi Wheelchair with removable arm rests, seat cushion, extended brake handles and anti-tippers   Patient has been advised to f/u with their PCP in 1-2 weeks to bring them up to date on their rehab stay.  Social services at facility was responsible for arranging this appointment.  Pt was provided with a 30 day supply of prescriptions for medications and refills must be obtained from their PCP.  For controlled substances, a more limited supply may be provided adequate until PCP appointment only.  Future labs/tests needed:  CBC, BMP with ALF PCP

## 2015-08-22 ENCOUNTER — Ambulatory Visit (INDEPENDENT_AMBULATORY_CARE_PROVIDER_SITE_OTHER): Payer: Medicare Other | Admitting: Podiatry

## 2015-08-22 ENCOUNTER — Encounter: Payer: Self-pay | Admitting: Podiatry

## 2015-08-22 VITALS — BP 184/70 | HR 60 | Resp 12

## 2015-08-22 DIAGNOSIS — B351 Tinea unguium: Secondary | ICD-10-CM

## 2015-08-22 DIAGNOSIS — I639 Cerebral infarction, unspecified: Secondary | ICD-10-CM | POA: Diagnosis not present

## 2015-08-22 DIAGNOSIS — M79604 Pain in right leg: Secondary | ICD-10-CM

## 2015-08-22 DIAGNOSIS — M79676 Pain in unspecified toe(s): Secondary | ICD-10-CM | POA: Diagnosis not present

## 2015-08-22 DIAGNOSIS — M79605 Pain in left leg: Secondary | ICD-10-CM

## 2015-08-22 NOTE — Progress Notes (Signed)
   Subjective:    Patient ID: Virginia Meyer, female    DOB: 11-11-1928, 82 y.o.   MRN: [id number]  HPI   PT REQUESTING FOR TOENAILS TRIM.  Review of Systems  Skin: Positive for color change.       Objective:   Physical Exam        Assessment & Plan:

## 2015-08-22 NOTE — Progress Notes (Signed)
Subjective:     Patient ID: Virginia Meyer, female   DOB: 02/14/1929, 82 y.o.   MRN: [id number]  HPI patient presents and poor health and wheelchair with incurvated nailbeds 1-5 both feet that are impossible for her to take care of that become painful   Review of Systems  All other systems reviewed and are negative.      Objective:   Physical Exam  Constitutional: She is oriented to person, place, and time.  Cardiovascular: Intact distal pulses.   Musculoskeletal: Normal range of motion.  Neurological: She is oriented to person, place, and time.  Skin: Skin is warm and dry.  Nursing note and vitals reviewed.  neurovascular status found to be moderately diminished but intact with mild edema in the ankle region bilateral secondary to nonweightbearing with reduced range of motion the subtalar midtarsal joint bilateral. Patient's found to have incurvated nailbeds 1-5 both feet that are thick yellow brittle and they're painful in the corner secondary to deformity and elongation     Assessment:     At risk patient with mycotic nail infections 1-5 both feet    Plan:     H&P conditions reviewed and debrided nailbeds 1-5 both feet with no iatrogenic bleeding noted

## 2015-09-01 ENCOUNTER — Telehealth: Payer: Self-pay | Admitting: Neurology

## 2015-09-01 NOTE — Telephone Encounter (Signed)
Receive records today notes on Virginia Meyer.

## 2015-09-01 NOTE — Telephone Encounter (Signed)
Dr Jaynee Eagles- Received records. Put with your notes from today.

## 2015-09-01 NOTE — Telephone Encounter (Signed)
We have not received them yet spoke with debra and she is going to call daughter and research. Thanks.

## 2015-09-01 NOTE — Telephone Encounter (Signed)
Daughter [name] [phone] called to inquire if Dr. Jaynee Eagles was able to contact [place] or hospital in [place] to find out what caused stroke? Please call.

## 2015-09-01 NOTE — Telephone Encounter (Signed)
Dr Ahern- please advise 

## 2015-09-01 NOTE — Telephone Encounter (Signed)
I faxed over a fax cover request sheet requesting medical records, imaging C[facility] and records. Fax [phone]

## 2015-09-01 NOTE — Telephone Encounter (Signed)
Called daughter, [name] back and relayed Dr Jaynee Eagles message below. She verbalized understanding.

## 2015-09-02 NOTE — Telephone Encounter (Signed)
Let patient's daughter know we got the records unfortunately I need a few days to review them thanks

## 2015-09-02 NOTE — Telephone Encounter (Addendum)
Called daughter, [name] again. LVM and advised we received records. Dr Jaynee Eagles needs a few days to review. Gave [facility] phone number if she has further questions.

## 2015-09-10 ENCOUNTER — Telehealth: Payer: Self-pay | Admitting: *Deleted

## 2015-09-10 NOTE — Telephone Encounter (Signed)
Receive pt Cd in the mail. CD on [employer].

## 2015-09-16 ENCOUNTER — Telehealth: Payer: Self-pay | Admitting: Neurology

## 2015-09-16 NOTE — Telephone Encounter (Signed)
Virginia Meyer, would you call daugter and see if they could come in for an appointment a little earlier maybe in August sometime? Would prefer an hour appointment or maybe at lunch or 4pm so I can have extra time. I read the records and their is too much to discuss over the phone, prefer to do it in person.  Let me know, thanks

## 2015-09-17 NOTE — Telephone Encounter (Signed)
Spoke to pt's dgt, [name] - appt has been scheduled for 10/07/15 at noon.  She is aware to arrive 15 minutes early for check-in.

## 2015-09-29 ENCOUNTER — Telehealth: Payer: Self-pay | Admitting: Neurology

## 2015-09-29 NOTE — Telephone Encounter (Signed)
Called daughter back. Cx appt on 10/07/15 and r/s to 8/10 at 12pm. Dr Jaynee Eagles previously requested an hour long appt.

## 2015-09-29 NOTE — Telephone Encounter (Signed)
Pt's daughter called in to r/s appts. But she is confused about what is already scheduled. She would like to speak with the nurse before changing any appts . Please call

## 2015-10-02 ENCOUNTER — Encounter: Payer: Self-pay | Admitting: Neurology

## 2015-10-02 ENCOUNTER — Ambulatory Visit (INDEPENDENT_AMBULATORY_CARE_PROVIDER_SITE_OTHER): Payer: Medicare Other | Admitting: Neurology

## 2015-10-02 ENCOUNTER — Ambulatory Visit: Payer: Self-pay | Admitting: Neurology

## 2015-10-02 VITALS — BP 192/72 | HR 58 | Temp 97.0°F | Ht 60.0 in

## 2015-10-02 DIAGNOSIS — I5022 Chronic systolic (congestive) heart failure: Secondary | ICD-10-CM

## 2015-10-02 DIAGNOSIS — I63411 Cerebral infarction due to embolism of right middle cerebral artery: Secondary | ICD-10-CM | POA: Diagnosis not present

## 2015-10-02 DIAGNOSIS — C7951 Secondary malignant neoplasm of bone: Secondary | ICD-10-CM

## 2015-10-02 DIAGNOSIS — I480 Paroxysmal atrial fibrillation: Secondary | ICD-10-CM | POA: Diagnosis not present

## 2015-10-02 NOTE — Patient Instructions (Addendum)
Remember to drink plenty of fluid, eat healthy meals and do not skip any meals. Try to eat protein with a every meal and eat a healthy snack such as fruit or nuts in between meals. Try to keep a regular sleep-wake schedule and try to exercise daily, particularly in the form of walking, 20-30 minutes a day, if you can.    Suggest Eliquis or blood thinner for atrial fibrillation Imaging of the carotids if we can get it based on kidney function(need to give contrast for CTA and need to check kidney levels first) Cardiology follow up Heamtology/oncology for lytic lesions in the bone of the skull  Our phone number is [phone]. We also have an after hours call service for urgent matters and there is a physician on-call for urgent questions. For any emergencies you know to call 911 or go to the nearest emergency room

## 2015-10-05 NOTE — Progress Notes (Addendum)
[id number]    Provider:  Dr Jaynee Eagles Referring Provider: Blanchie Serve, MD Primary Care Physician:  Reymundo Poll, MD  CC:  Previous CVA  Addendum: Patient may be a candidate for botox for spasticity. Pt's daughter called said [facility] OT has advised that botox could possibly help to give relief to a spastic tendon behind the left knee. She said Dr Fredderick Phenix [facility] said if that could be done asap so it does not get worse.  Recommend evaluation with Dr. Krista Blue.  Interval History 10/02/2015: Patient and her daughter are here for follow-up today and to review extensive history and documentation that I received from [facility]. Personally reviewed over 200 pages of documents as summarized below. Patient does state she has a history of Afib and at one point her cardiologist wanted her to be placed in Eliquis. Patient says she took a request for a short period of time and then stopped for unknown reason. It does not appear she understood why she was on the Eliquis as she has memory problems but did state "afib" and "Eliquis".   Patient presented on 05/26/2015. Documents state the patient does have a history of atrial fibrillation. Also a history of hypertension, peripheral neuropathy, congestive heart failure with preserved ejection fraction, moderate aortic stenosis, moderate mitral and tricuspid insufficiency, atrial fibrillation, venous insufficiency.  anxiety, hyperlipidemia, peripheral vascular disease who presented to the emergency room with complaints of dizziness ongoing for 2 days. She also reported an unsteady gait. She was previously treated for pneumonia 2 months prior to presenting for dizziness. In the emergency room she had some audible wheezes, her O2 sat on ABG was 89% with pneumonia, and she had bradycardia into the 30s and acute renal failure. She had been previously treated with Bactrim for the last 5 days. Her blood pressure was  borderline hypotensive and she was also found to be hyponatremic. Patient was admitted into the hospital. CT scan on admission showed an area of low attenuation involving the territory of the left PCA.  At no point in the documentation does say that patient was on blood thinner at the time of this admission.   On April 4 in the evening patient developed acute left-sided weakness and facial droop also in A. fib heart rate in the 130s. Repeat CT of the head negative for acute bleed and NIH stroke scale 13. and TPA was ordered. She was started on meds per cardiology for A. fib, CHF, left lower extremity edema with some movement, mentation declined.  MRI MRA of the [place], carotid ultrasound, echo with bubble were ordered.  MRI of the [place] with and without contrast on 05/28/2015 showed supratentorial and infratentorial volume loss, diffusion-weighted images showed abnormal restricted diffusion in the posterior right frontal lobe, most of the right temporal lobe, throughout most of the right parietal lobe, and in the lateral aspect of the right basal ganglia, consistent with a large right middle cerebral artery distribution subacute or acute cerebrovascular accident. The cytotoxic edema in the subacute/acute right middle cerebral artery distribution is producing mild mass effect in the right cerebral hemisphere and mild mass effect on the lateral aspect of the right lateral ventricle. There is a moderate degree of chronic white matter small vessel ischemia. Also seen are 2 enhancing areas in the diploic space of the calvarium. There is one in the left frontal bone measuring 2.3 cm in the greatest dimension. There is one in the right frontal bone measuring 0.8 cm in greatest  dimension. These enhancing space-occupying lesions in the diploic space the left frontal bone and in the right frontal bone may conceivably represent metastatic lesions.  MRA of the head without contrast: Anterior circulation:   Right  internal carotid artery: There is superficial plaque formation in the right cavernous carotid artery in the cavernous portion of the right internal carotid artery.  The right middle cerebral artery is not seen consistent with an occluded right middle cerebral artery from the origin of the right middle cerebral artery at the level of the right supraclinoid internal carotid artery.  Right anterior cerebral artery: Patent, no focal stenosis, segmental occlusion or aneurysm.  Left cavernous carotid artery: Superficial plaque formation is seen in the cavernous portion of the left internal carotid artery.  Left middle cerebral artery: Patent, no focal stenosis, segmental occlusion or aneurysm.  Left anterior cerebral artery: Patent, no focal stenosis, segmental occlusion or aneurysm.  Posterior circulation: Right vertebral artery: Patent, no focal stenosis, segmental occlusion or aneurysm.  Left vertebral artery: The distal left cerebral artery is not seen.  Basilar artery: Superficial plaque formation is seen throughout the basilar artery.  Right posterior cerebral artery: Superficial plaque formation is seen throughout the P1 and P2 segments of the right posterior cerebral artery.  Left posterior cerebral artery: Superficial plaque formation is seen throughout the P1, P2 and P3 segments of the left posterior cerebral artery.  Ultrasound bilateral carotids: Over 70% diameter stenosis in the proximal left internal carotid artery. There is less than 40% diameter stenosis in the proximal right internal carotid artery.  Ultrasound venous lower extremity bilateral: No evidence of deep vein thrombosis in the right or left lower extremity.  Repeat CT of the head the late evening of 05/28/2015 showed an evolving large right MCA distribution infarct with associated mass effect effacing the overlying cortical sulci. 5 mm leftward midline shift. Tiny hyperdensity within the left periventricular white  matter most compatible with small intraparenchymal hemorrhage. Evolving acute right MCA distribution infarct with associated mass effect resulting in 5 mm left midline shift slightly increased than on the prior MRI performed 8 hours ago. Tiny focus of intraparenchymal hemorrhage in the left frontal subcortical white which was evident on the prior MRI examination but is new from the prior CT examination.  MRA of the neck with and without contrast:   Arch: The origin of the great vessels are not well seen in this examination. There may be hemodynamically significant stenosis at the origin of the left common carotid artery and at the origin of the left subclavian artery.  Right [place] carotid artery: No significant stenosis Internal carotid artery: There is an approximately 50% diameter stenosis in the proximal right internal carotid artery. External carotid artery: No significant stenosis.  Left Common carotid artery: No significant stenosis. Internal carotid artery: There is an approximately 50% diameter stenosis in the origin of the left internal carotid artery. Nevertheless in view of what was seen in the bilateral carotid ultrasound performed today probably the degree of stenosis in the proximal left internal carotid artery is greater than 50%. CTA is recommended. External carotid artery: No significant stenosis.  Repeat CT of the head with and without contrast 05/29/2015:  Large territory infarct with extensive edema involving the right frontal parietal temporal lobes with mild mass effect and minimal ((less than 5 mm) right-to-left midline shift. Hyperdensity in the left centrum semiovale most likely representing a small acute petechial hemorrhage. There is mild enhancement in the infarcted Labus parenchyma, likely related to  post infarction perfusion. There is no definite evidence of an enhancing mass. There is calvarial lucencies in the frontal bones, which correlates with the enhancing  lesions noted on the MRI of the [place]. Metastasis cannot be excluded.  CT of the thorax/abdomen/pelvis: The study is moderately suboptimal due to extensive respiratory motion.  Consolidative changes in the lower lobes bilaterally, which may be related to atelectasis. Correlate to exclude pneumonia or aspiration.  Large pleural effusions bilaterally.  9 mm indeterminate nodule in the left upper lobe. Consider PET CT to exclude malignancy.  Atherosclerosis and cardiomegaly.  Gallbladder contains a large stone. There is wall thickening. Acute or chronic cholecystitis cannot be excluded. Correlate with clinical findings. Consider a nuclear medicine hepatobiliary scan with calculation of gallbladder ejection fraction.  Small to moderate amount of abdominal and pelvic ascites. A nonspecific but abnormal finding. Correlate with clinical findings to exclude peritoneal infectious or inflammatory process.  Diverticulosis coli without definitive evidence of acute diverticulitis.  Left renal cyst.  Severe degenerative changes in the spine with kyphosis and malalignment. There is probable multilevel spinal stenosis.  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 No liposarcoma cannot be excluded with certainty. This may be further evaluated by contrast-enhanced MRI with and without contrast.  Ultrasound arterial lower right: ABIs suggesting moderate arterial occlusive disease.  MRA of the lower extremity with without contrast: Focal ulcerated plaque versus pseudoaneurysm involving the ventral margin of the distal right common iliac artery small aneurysm sac measuring up to 6 mm.  Severe origin stenosis of the right external iliac artery with a more moderate segmental tandem stenosis in the midportion.  No significant left iliac inflow disease.  Metallic stent extending from the left common femoral artery into the left SFA with additional overlapping  metallic stents distally extending into the popliteal artery. Stents appear grossly patent but degree of stent restenosis cannot be ascertained on this examination.  Suboptimal evaluation of the left tibial arteries due to extensive venous contamination of the tibial perineal trunk vessels. Anterior tibial artery demonstrates severe segmental stenosis proximally but distal reconstitution of patency to the dorsalis pedis level.  Stent artifact diffusely involving the right SFA majority of the popliteal artery. Stents appear to grossly maintain some degree of patency.  Severe segmental stenosis of the proximal tibioperoneal trunk.  Aberrant origin of the right anterior tibial artery at the level of the knee articulation with proximal patency but occlusion of the proximal Without distal reconstitution.  Right posterior tibial artery is overall adequately patent with any moderate short segmental stenosis in the midportion.  Peroneal artery demonstrates proximal patency with occlusion above the ankle.     Nuclear medicine bone scan total body was performed. Only one solitary focus of abnormal bone scan activity in the inferior lateral right frontal bone or possibly sphenoid bone. Difficult bone scan evaluation. This does not is necessarily prove that the calvarial lesions are benign, lytic lesions could be normal on conventional nuclear bone scan. No osteoblastic skeletal metastases elsewhere in the axial and appendicular skeleton. Performed 05/31/2015.   Patient was discharged on aspirin 81 mg, simvastatin 80, carvedilol and digitoxin, diltiazem, doxazosin, famotidine, gabapentin, losartan and pentoxifylline. She was discharged to subacute nursing facility for continued physical therapy.  HPI:  [facility] is a 82 y.o. female here as a referral for remote CVA. PMHx CVA, HTN, Left hemiparesis, A-fib, CAD, PN and PVD. On ASA 81mg  sequelae from stroke includes left hemiparesis, contractures, left  foot drop, weakness.  Stroke was 2 months ago, went to the ED in [facility], for dizziness, She was admitted and then had a stoke that evening. She was given TPA. She moved here to a nursing facility to be close to her daughters. She was in the hospital for 2 weeks in serious condition. She couldn't move anything on the left but the left side is improving. She also had a facial droop. I don't have any details from her hospitalization, no details on the stroke but will request all records. She is in therapy. She can't walk, she can stand with assistance. No swallowing problems anymore, no aphasia or dysarthria. She forgets there are things on the left, possibly some neglect. No new sensory changes with the stroke. She has had terrible neuropathy in the feet for many years unclear diagnosis or why. She has been having memory problems more short-term memory and reasoning since the stroke. She is incontinent but this is not new. She used to be on Eliquis, She is unsure when she stopped it or when or why she was on it or when she was diagnosed with afib. Need records. Daughter is here with her and provides most information. Daughter went to the [employer] enrichment and then lived in the [place] area but now he is hearing [facility] and patient is here to be near her daughter. Her neuropathy is most painful at night while she is in bed.  Reviewed notes, labs and imaging from outside physicians, which showed: Reviewed records provided by nursing facility. Patient is on aspirin 81 mg, no anticoagulation is being given to her for her atrial fibrillation. She is on gabapentin 600 mg for neuropathy. Patient has hemiplegia and hemiparesis following unspecified cerebrovascular disease affecting left nondominant side. She was hospitalized for acute CVA given TPA. Has left hemiparesis, contracture management, left foot drop, needs rehabilitation for strengthening mobility, monitor for pain and muscle spasms, on ASA 81 mg by mouth  daily.   Review of Systems: Patient complains of symptoms per HPI as well as the following symptoms: Cold intolerance, incontinence of bladder, joint pain, walking difficulty, itching. Pertinent negatives per HPI. All others negative.   Social History   Social History  . Marital status: Widowed    Spouse name: N/A  . Number of children: 4  . Years of education: 12   Occupational History  . Retired    Social History Main Topics  . Smoking status: Never Smoker  . Smokeless tobacco: Never Used  . Alcohol use Not on file  . Drug use: Unknown  . Sexual activity: Not on file   Other Topics Concern  . Not on file   Social History Narrative   Lives at [facility] and rehab (Fax: [phone])   Caffeine use: 1 cup tea/day   1 cup coffee/day    Family History  Problem Relation Age of Onset  . Heart attack Brother   . Stroke Neg Hx   . Neuropathy Neg Hx     Past Medical History:  Diagnosis Date  . Anxiety   . HLD (hyperlipidemia)   . HTN (hypertension)   . Neuropathy ([place])   . PVD (peripheral vascular disease) ([place])   . Stroke [facility])     History reviewed. No pertinent surgical history.  Current Outpatient Prescriptions  Medication Sig Dispense Refill  . acetaminophen (TYLENOL) 500 MG tablet Take 500 mg by mouth 2 (two) times daily. Left knee    . aspirin 81 MG chewable tablet Chew 81 mg by mouth  daily. Chewable tablet    . carvedilol (COREG) 12.5 MG tablet Take 12.5 mg by mouth every 12 (twelve) hours.    . digoxin (LANOXIN) 0.125 MG tablet Take 0.125 mg by mouth daily.    Marland Kitchen diltiazem (CARDIZEM) 60 MG tablet Take 60 mg by mouth every 8 (eight) hours.    Marland Kitchen doxazosin (CARDURA) 1 MG tablet Take 1 mg by mouth at bedtime.    . gabapentin (NEURONTIN) 600 MG tablet Take 600 mg by mouth daily. 1 tablet in am for neuropathy 2 tablets in the pm    . hydrALAZINE (APRESOLINE) 10 MG tablet Take 10 mg by mouth. Give 1/2 tab po every 12 hr prn for HTN    . Menthol, Topical  Analgesic, (BIOFREEZE) 4 % GEL Apply 1 application topically 2 (two) times daily. Left knee for pain    . famotidine (PEPCID) 20 MG tablet Take 20 mg by mouth daily.      No current facility-administered medications for this visit.     Allergies as of 10/02/2015  . (No Known Allergies)    Vitals: BP (!) 192/72 (BP Location: Left Arm, Patient Position: Sitting, Cuff Size: Normal)   Pulse (!) 58   Temp 97 F (36.1 C) (Oral)   Ht 5' (1.524 m)  Last Weight:  Wt Readings from Last 1 Encounters:  08/21/15 112 lb (50.8 kg)   Last Height:   Ht Readings from Last 1 Encounters:  10/02/15 5' (1.524 m)    Physical exam: Exam: Gen: NAD, conversant, well nourised                    CV: RRR, no MRG. No Carotid Bruits. No peripheral edema, warm, nontender Eyes: Conjunctivae clear without exudates or hemorrhage  Neuro: Detailed Neurologic Exam  Speech:    Speech is normal; fluent and spontaneous with normal comprehension.  Cognition:    The patient is oriented to person, place, and time;     recent and remote memory Impaired;     language fluent;     Impaired attention, concentration,     fund of knowledge impaired Cranial Nerves:    The pupils are equal, round, and reactive to light.  Attempted funduscopic exam could not visualize due to small pupils. Visual fields are full to finger confrontation. Extraocular movements are intact. Trigeminal sensation is impaired on the left and the muscles of mastication are normal. Lower left facial weakness. The palate elevates in the midline. Hearing impaired. Voice is normal. Shoulder shrug is normal. The tongue has normal motion without fasciculations.   Coordination: Impaired with the left arm and left leg. Intact on the right. Dec fine motor on the left esp left hand.   Gait:    Can stand briefly with assistance.   Motor Observation:    No asymmetry, no atrophy, and no involuntary movements noted. Tone:    Normal muscle tone.  No  spasticity  Posture:    Posture stopped in the wheelachair.     Strength: Left pronator drift. Left hemi[paresiss that is not too different from the right side. Right is 4-4+/4 and the left side is 3+-4/5.        Sensation: Left hemiparesis and significant distal dec in sensation due to polyneuropathy.      Reflex Exam:  DTR's:    Deep tendon reflexes in the upper and lower extremities are brisk for age and medical conditions, left greater than the right .   Toes:  The toes are downgoing bilaterally.   Clonus:    Clonus is absent.     Assessment/Plan:  82 y.o. female here as a referral for remote CVA. PMHx CVA, HTN, Left hemiparesis, A-fib, CAD, PN and PVD, congestive heart failure with preserved ejection fraction, moderate aortic stenosis, moderate mitral and tricuspid insufficiency, venous insufficiency, anxiety, hyperlipidemia. On ASA 81mg , sequelae from stroke includes left hemiparesis, contractures, left foot drop, weakness. Stroke was 2 months ago (please see extensive review of medical documentation under  history present illness). Patient with a large right MCA ischemic stroke secondary to atrial fibrillation administered TPA in April in [place]. At some point patient states she was on eloquis but stopped it for some reason, there appeared to be memory deficits and patient possibly dementia.   Large right MCA stroke secondary to atrial fibrillation: - History of atrial fibrillation on eliquis at some point, unclear why that was stopped, patient had acute onset left hemiplegia in April administered TPA subsequent large right MCA stroke secondary to atrial fibrillation.  - Patient was discharged on aspirin. I do not have a recent creatinine, last was elevated in April, recommended labs today but her daughter and patient prefer me to see if she's had labs done at her assisted living facility. We'll try to get records otherwise she'll need to have labs redrawn to evaluate creatinine  and possibility for blood thinners. Had a long discussion about restarting blood thinners, risks and benefits, she is at high risk for repeat stroke given her atrial fibrillation. - MRA of the head did not show any significant large vessel stenosis or atherosclerosis - Ultrasound of the bilateral carotids showed possible 70% diameter stenosis in the left internal carotid artery and possible 40% diameter stenosis in the right internal carotid artery. MRA of the neck showed possible 50% bilaterally. Need CTA of the head and neck. Pending creatinine labs/records. - MRI of the [place] also showed supratentorial and infratentorial volume loss with chronic advanced micro-vascular changes.    Follow up:  - Patient has a history of atrial fibrillation, congestive heart failure, needs follow-up with cardiology. - Also seen were 2 enhancing areas in the diploic space of the calvarium, whole body PET scan was negative as well as an CT. Patient likely needs to be followed up in hematology and oncology for further workup. - Need CTA of the neck to evaluate for stenosis in the carotid artery seen on carotid Dopplers and MRA of the neck. Trying to get records for her last creatinine value, daughter and patient declined labs here in the office and requested I find any previous labs drawn lately. - Patient and daughter would like a consult from a vascular/stroke neurologist. Burnis Medin refer her to one of my colleagues for a one-time consult.  Addendum: Patient may be a candidate for botox for spasticity. Pt's daughter called said [facility] OT has advised that botox could possibly help to give relief to a spastic tendon behind the left knee. She said Dr Fredderick Phenix [facility] said if that could be done asap so it does not get worse.  Recommend evaluation with Dr. Krista Blue.   Cc: Dr. Virl Son, MD  [facility] [address] [place], [place] [address]  Phone [phone]  Fax [phone]  A total of 60 minutes was spent face-to-face with this patient. Over half this time was spent on counseling patient on the embolic stroke diagnosis and different diagnostic and therapeutic options available.

## 2015-10-06 ENCOUNTER — Telehealth: Payer: Self-pay | Admitting: *Deleted

## 2015-10-06 NOTE — Telephone Encounter (Signed)
Called [facility]. Pt no longer resident there. I LVM for medical records to fax any recent labs over. Gave fax [phone] for them to fax recent labs/creatinine to our office.

## 2015-10-06 NOTE — Telephone Encounter (Signed)
Called and spoke to [place] at [facility]. She will fax most recent labs to [phone].  Labs done on 09/30/15: CBC/lipid/TSH/B12/Folate/hemoglobin A1C/TSH.

## 2015-10-06 NOTE — Telephone Encounter (Signed)
Received results via fax from [facility] as requested.

## 2015-10-07 ENCOUNTER — Ambulatory Visit: Payer: Self-pay | Admitting: Neurology

## 2015-10-07 ENCOUNTER — Other Ambulatory Visit: Payer: Self-pay | Admitting: Neurology

## 2015-10-07 DIAGNOSIS — N179 Acute kidney failure, unspecified: Secondary | ICD-10-CM

## 2015-10-07 NOTE — Telephone Encounter (Signed)
Pt daughter, [name] called office back. I advised Dr Jaynee Eagles requests pt get lab work completed prior to seeing Dr Erlinda Hong on 8/24. He needs to know her kidney function prior to starting her on a blood thinner such as coumadin or eliquis. She needs to get lab work completed this week per Dr Jaynee Eagles. I gave her lab hours and told her to call if she has further questions. She verbalized understanding.

## 2015-10-07 NOTE — Telephone Encounter (Signed)
Called daughter per Dr Jaynee Eagles request. LVM for her to call.  We tried getting labs from [facility]. They did not include kidney function. She needs to come back to our office to get lab work completed to check kidney function prior to having MRI and Dr Jaynee Eagles prescribing any medication. Please let her know if she calls back.   Unless she knows where she last had her BMP/creatinine checked and can get those results. Needs to be recent blood work.

## 2015-10-09 ENCOUNTER — Other Ambulatory Visit (INDEPENDENT_AMBULATORY_CARE_PROVIDER_SITE_OTHER): Payer: Self-pay

## 2015-10-09 DIAGNOSIS — N179 Acute kidney failure, unspecified: Secondary | ICD-10-CM

## 2015-10-09 DIAGNOSIS — Z0289 Encounter for other administrative examinations: Secondary | ICD-10-CM

## 2015-10-10 LAB — BASIC METABOLIC PANEL
BUN/Creatinine Ratio: 20 (ref 12–28)
BUN: 17 mg/dL (ref 8–27)
CALCIUM: 9.7 mg/dL (ref 8.7–10.3)
CO2: 23 mmol/L (ref 18–29)
Chloride: 101 mmol/L (ref 96–106)
Creatinine, Ser: 0.83 mg/dL (ref 0.57–1.00)
GFR, EST AFRICAN AMERICAN: 75 mL/min/{1.73_m2} (ref >59)
GFR, EST NON AFRICAN AMERICAN: 65 mL/min/{1.73_m2} (ref >59)
Glucose: 103 mg/dL — ABNORMAL HIGH (ref 65–99)
POTASSIUM: 4.8 mmol/L (ref 3.5–5.2)
Sodium: 140 mmol/L (ref 134–144)

## 2015-10-13 ENCOUNTER — Telehealth: Payer: Self-pay | Admitting: *Deleted

## 2015-10-13 NOTE — Telephone Encounter (Signed)
Pt's daughter, [name], called back I did relay to her labs were normal. She expressed understanding.

## 2015-10-13 NOTE — Telephone Encounter (Signed)
-----   Message from Melvenia Beam, MD sent at 10/12/2015  8:21 PM EDT ----- Lab normal including kidney function thanks

## 2015-10-13 NOTE — Telephone Encounter (Signed)
LVM for daughter [name] to call about results. Gave [facility] phone number.   **Okay to inform her labs normal, including kidney function per Dr Jaynee Eagles

## 2015-10-16 ENCOUNTER — Encounter: Payer: Self-pay | Admitting: Neurology

## 2015-10-16 ENCOUNTER — Ambulatory Visit (INDEPENDENT_AMBULATORY_CARE_PROVIDER_SITE_OTHER): Payer: Medicare Other | Admitting: Neurology

## 2015-10-16 VITALS — BP 184/67 | HR 58 | Ht 60.0 in

## 2015-10-16 DIAGNOSIS — I480 Paroxysmal atrial fibrillation: Secondary | ICD-10-CM | POA: Diagnosis not present

## 2015-10-16 DIAGNOSIS — I63411 Cerebral infarction due to embolism of right middle cerebral artery: Secondary | ICD-10-CM

## 2015-10-16 DIAGNOSIS — I10 Essential (primary) hypertension: Secondary | ICD-10-CM

## 2015-10-16 DIAGNOSIS — I739 Peripheral vascular disease, unspecified: Secondary | ICD-10-CM

## 2015-10-16 DIAGNOSIS — I639 Cerebral infarction, unspecified: Secondary | ICD-10-CM | POA: Diagnosis not present

## 2015-10-16 DIAGNOSIS — E785 Hyperlipidemia, unspecified: Secondary | ICD-10-CM | POA: Diagnosis not present

## 2015-10-16 MED ORDER — APIXABAN 2.5 MG PO TABS: 2.5000 mg | ORAL_TABLET | Freq: Two times a day (BID) | ORAL | 2 refills | Status: AC

## 2015-10-16 NOTE — Patient Instructions (Addendum)
-   start eliquis 2.5mg  twice a day and stop the ASA once your start eliquis - continue follow up with orthopedics for both knee treatment - continue follow up with cardiology for afib management. - recommend oncology follow up but not in rush at this time - recommend yearly monitoring with carotid doppler for carotid stenosis - continue home PT/OT, once both knee pain better, recommend outpatient PT/OT - Follow up with your primary care physician for stroke risk factor modification. Recommend maintain blood pressure goal <130/80, diabetes with hemoglobin A1c goal below 7.0% and lipids with LDL cholesterol goal below 70 mg/dL.  - check BP at home and record. - follow up with Dr. Jaynee Eagles in 3 months.

## 2015-10-17 NOTE — Progress Notes (Signed)
STROKE NEUROLOGY FOLLOW UP NOTE  NAME: [place] Meyer DOB: 02/17/1929  REASON FOR VISIT: stroke follow up HISTORY FROM: pt and daughter and chart  Today we had the pleasure of seeing [name] in follow-up at our Neurology Clinic. Pt was accompanied by daughter.   History Summary [name] a 82 y.o.femalewith PMHx HTN, HLD, CHF with preserved EF, A-fib off eliquis for unclear reason, CAD, PN and PVD admitted in 05/2015 in [place], [place] for dizziness and unsteady gait x 2 days. She developed left facial droop and left-sided weakness with A. fib RVR. NIHSS = 13. In CT negative for bleeding. She was given TPA. MRI showed large right MCA territory subacute or acute infarct with mild mass effect. MRI also seen 2 enhancing areas in the diploic space of the calvarium concerning for metastatic lesions. MRI showed right M1 proximal occlusion. CUS showed > 70% left ICA stenosis and < 40% right ICA stenosis. However, MRA of the neck showed bilateral ICA about 50% stenosis. DVT negative. CT chest/abdomen/pelvis showed no significant evidence of malignancy. She was in the hospital for 2 weeks in serious condition with left facial droop and left hemiplegia. She was discharged on aspirin 81 mg, simvastatin 80, carvedilol and digitoxin, diltiazem, doxazosin, famotidine, gabapentin, losartan and pentoxifylline. She moved here to a nursing facility to be close to her daughters.  Interval History During the interval time, the patient has been doing well. Followed with Dr. Jaynee Eagles in clinic. Has not seen cardiology yet but has appointment set up. Still on ASA 81mg . Left side muscle strength much improved, however, due to b/l knee pain, she still in wheelchair. She followed with orthopedics and and going posterior injection treatment. Still has home PT/OT. BP today 184/67, follows with Dr. Fredderick Phenix for HTN management, currently on Coreg, cardizem, doxazosin, and hydralazine.   REVIEW OF SYSTEMS: Full 14 system  review of systems performed and notable only for those listed below and in HPI above, all others are negative:  Constitutional:   Cardiovascular: Murmur Ear/Nose/Throat:  Hearing loss Skin: Itching Eyes:   Respiratory:   Gastroitestinal:   Genitourinary: Incontinence of bladder Hematology/Lymphatic:   Endocrine:  Musculoskeletal:  Joint pain, joint swelling, walking difficulty, neck stiffness Allergy/Immunology:   Neurological:   Psychiatric:  Sleep:   The following represents the patient's updated allergies and side effects list: No Known Allergies  The neurologically relevant items on the patient's problem list were reviewed on today's visit.  Neurologic Examination  A problem focused neurological exam (12 or more points of the single system neurologic examination, vital signs counts as 1 point, cranial nerves count for 8 points) was performed.  Blood pressure (!) 184/67, pulse (!) 58, height 5' (1.524 m).  General - Well nourished, well developed, in no apparent distress.  Ophthalmologic - Fundi not visualized due to noncooperation.  Cardiovascular - irregularly irregular heart rate and rhythm.  Mental Status -  Level of arousal and orientation to time, place, and person were intact. Language including expression, naming, repetition, comprehension was assessed and found intact. Fund of Knowledge was assessed and was intact.  Cranial Nerves II - XII - II - Visual field intact OU. III, IV, VI - Extraocular movements intact. V - Facial sensation intact bilaterally. VII - left facial droop. VIII - Hearing & vestibular intact bilaterally. X - Palate elevates symmetrically. XI - Chin turning & shoulder shrug intact bilaterally. XII - Tongue protrusion intact.  Motor Strength - The patient's strength was 3+/5 LUE and 4+/5 LLE  proximal and distal but not able to do knee extension due to pain. 5/5 RUE and 5-/5 LLE proximal and 5/5 distal but not able to do knee extension  due to pain and pronator drift was present on the left.  Bulk was normal and fasciculations were absent.   Motor Tone - Muscle tone was assessed at the neck and appendages and was normal.  Reflexes - The patient's reflexes were 1+ in all extremities and she had no pathological reflexes.  Sensory - Light touch, temperature/pinprick were assessed and were normal.    Coordination - The patient had normal movements in the hands with no ataxia or dysmetria.  Tremor was absent.  Gait and Station - in wheelchair, not tested.   Data reviewed: I personally reviewed the images and agree with the radiology interpretations.  MRI of the [place] with and without contrast on 05/28/2015   supratentorial and infratentorial volume loss, diffusion-weighted images showed abnormal restricted diffusion in the posterior right frontal lobe, most of the right temporal lobe, throughout most of the right parietal lobe, and in the lateral aspect of the right basal ganglia, consistent with a large right middle cerebral artery distribution subacute or acute cerebrovascular accident. The cytotoxic edema in the subacute/acute right middle cerebral artery distribution is producing mild mass effect in the right cerebral hemisphere and mild mass effect on the lateral aspect of the right lateral ventricle. There is a moderate degree of chronic white matter small vessel ischemia. Also seen are 2 enhancing areas in the diploic space of the calvarium. There is one in the left frontal bone measuring 2.3 cm in the greatest dimension. There is one in the right frontal bone measuring 0.8 cm in greatest dimension. These enhancing space-occupying lesions in the diploic space the left frontal bone and in the right frontal bone may conceivably represent metastatic lesions.  MRA of the head without contrast: Anterior circulation:  Right internal carotid artery: There is superficial plaque formation in the right cavernous carotid artery in the  cavernous portion of the right internal carotid artery. The right middle cerebral artery is not seen consistent with an occluded right middle cerebral artery from the origin of the right middle cerebral artery at the level of the right supraclinoid internal carotid artery. Right anterior cerebral artery: Patent, no focal stenosis, segmental occlusion or aneurysm. Left cavernous carotid artery: Superficial plaque formation is seen in the cavernous portion of the left internal carotid artery. Left middle cerebral artery: Patent, no focal stenosis, segmental occlusion or aneurysm. Left anterior cerebral artery: Patent, no focal stenosis, segmental occlusion or aneurysm. Posterior circulation: Right vertebral artery: Patent, no focal stenosis, segmental occlusion or aneurysm. Left vertebral artery: The distal left cerebral artery is not seen. Basilar artery: Superficial plaque formation is seen throughout the basilar artery. Right posterior cerebral artery: Superficial plaque formation is seen throughout the P1 and P2 segments of the right posterior cerebral artery. Left posterior cerebral artery: Superficial plaque formation is seen throughout the P1, P2 and P3 segments of the left posterior cerebral artery.  Ultrasound bilateral carotids: Over 70% diameter stenosis in the proximal left internal carotid artery. There is less than 40% diameter stenosis in the proximal right internal carotid artery.  Ultrasound venous lower extremity bilateral: No evidence of deep vein thrombosis in the right or left lower extremity.  Repeat CT of the head the late evening of 05/28/2015 showed an evolving large right MCA distribution infarct with associated mass effect effacing the overlying cortical sulci. 5 mm  leftward midline shift. Tiny hyperdensity within the left periventricular white matter most compatible with small intraparenchymal hemorrhage. Evolving acute right MCA distribution infarct with associated mass  effect resulting in 5 mm left midline shift slightly increased than on the prior MRI performed 8 hours ago. Tiny focus of intraparenchymal hemorrhage in the left frontal subcortical white which was evident on the prior MRI examination but is new from the prior CT examination.  MRA of the neck with and without contrast:  Arch: The origin of the great vessels are not well seen in this examination. There may be hemodynamically significant stenosis at the origin of the left common carotid artery and at the origin of the left subclavian artery. Right [place] carotid artery: No significant stenosis Internal carotid artery: There is an approximately 50% diameter stenosis in the proximal right internal carotid artery. External carotid artery: No significant stenosis. Left Common carotid artery: No significant stenosis. Internal carotid artery: There is an approximately 50% diameter stenosis in the origin of the left internal carotid artery. Nevertheless in view of what was seen in the bilateral carotid ultrasound performed today probably the degree of stenosis in the proximal left internal carotid artery is greater than 50%. CTA is recommended. External carotid artery: No significant stenosis.  Repeat CT of the head with and without contrast 05/29/2015: Large territory infarct with extensive edema involving the right frontal parietal temporal lobes with mild mass effect and minimal ((less than 5 mm) right-to-left midline shift. Hyperdensity in the left centrum semiovale most likely representing a small acute petechial hemorrhage. There is mild enhancement in the infarcted [place] parenchyma, likely related to post infarction perfusion. There is no definite evidence of an enhancing mass. There is calvarial lucencies in the frontal bones, which correlates with the enhancing lesions noted on the MRI of the [place]. Metastasis cannot be excluded.  CT of the thorax/abdomen/pelvis:  The study is moderately  suboptimal due to extensive respiratory motion. Consolidative changes in the lower lobes bilaterally, which may be related to atelectasis. Correlate to exclude pneumonia or aspiration. Large pleural effusions bilaterally. 9 mm indeterminate nodule in the left upper lobe. Consider PET CT to exclude malignancy. Atherosclerosis and cardiomegaly. Gallbladder contains a large stone. There is wall thickening. Acute or chronic cholecystitis cannot be excluded. Correlate with clinical findings. Consider a nuclear medicine hepatobiliary scan with calculation of gallbladder ejection fraction. Small to moderate amount of abdominal and pelvic ascites. A nonspecific but abnormal finding. Correlate with clinical findings to exclude peritoneal infectious or inflammatory process. Diverticulosis coli without definitive evidence of acute diverticulitis. Left renal cyst. Severe degenerative changes in the spine with kyphosis and malalignment. There is probable multilevel spinal stenosis. 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 No liposarcoma cannot be excluded with certainty. This may be further evaluated by contrast-enhanced MRI with and without contrast.  Ultrasound arterial lower right: ABIs suggesting moderate arterial occlusive disease.  MRA of the lower extremity with without contrast: Focal ulcerated plaque versus pseudoaneurysm involving the ventral margin of the distal right common iliac artery small aneurysm sac measuring up to 6 mm. Severe origin stenosis of the right external iliac artery with a more moderate segmental tandem stenosis in the midportion. No significant left iliac inflow disease. Metallic stent extending from the left common femoral artery into the left SFA with additional overlapping metallic stents distally extending into the popliteal artery. Stents appear grossly patent but degree of stent restenosis cannot be ascertained on this  examination. Suboptimal evaluation  of the left tibial arteries due to extensive venous contamination of the tibial perineal trunk vessels. Anterior tibial artery demonstrates severe segmental stenosis proximally but distal reconstitution of patency to the dorsalis pedis level. Stent artifact diffusely involving the right SFA majority of the popliteal artery. Stents appear to grossly maintain some degree of patency. Severe segmental stenosis of the proximal tibioperoneal trunk. Aberrant origin of the right anterior tibial artery at the level of the knee articulation with proximal patency but occlusion of the proximal Without distal reconstitution. Right posterior tibial artery is overall adequately patent with any moderate short segmental stenosis in the midportion. Peroneal artery demonstrates proximal patency with occlusion above the ankle.  Nuclear medicine bone scan total body was performed. Only one solitary focus of abnormal bone scan activity in the inferior lateral right frontal bone or possibly sphenoid bone. Difficult bone scan evaluation. This does not is necessarily prove that the calvarial lesions are benign, lytic lesions could be normal on conventional nuclear bone scan. No osteoblastic skeletal metastases elsewhere in the axial and appendicular skeleton. Performed 05/31/2015.   Assessment: As you may recall, she is a 82 y.o. Caucasian female with PMH of HTN, HLD, CHF with preserved EF, A-fib off eliquis for unclear reason, CAD and PVD admitted in 05/2015 in [place], [place] for large right MCA territory subacute or acute infarct with mild mass effect in the setting of A. fib RVR. Received TPA. MRI also seen 2 enhancing areas in the diploic space of the calvarium concerning for metastatic lesions. MRI showed right M1 proximal occlusion. CUS showed > 70% left ICA stenosis and < 40% right ICA stenosis. However, MRA of the neck showed bilateral ICA about 50% stenosis.  DVT negative. CT  chest/abdomen/pelvis showed no significant evidence of malignancy. She was discharged on aspirin 81 mg, simvastatin 80. She moved here to a nursing facility to be close to her daughters. Followed with Dr. Jaynee Eagles in clinic. Pending cardiology consultation. Still on ASA 81mg . Left side muscle strength much improved, however, due to b/l knee pain, she still in wheelchair. She followed with orthopedics. Still has home PT/OT. BP still at high side,.    Plan:  - start eliquis 2.5mg  twice a day and stop the ASA once your start eliquis - no need of further neuroimaging at this time. - continue follow up with orthopedics for both knee treatment - continue follow up with cardiology for afib management. - recommend oncology follow up at nonemergent basis - recommend yearly monitoring with carotid doppler for carotid stenosis - continue home PT/OT, once both knee pain better, recommend outpatient PT/OT - Follow up with your primary care physician for stroke risk factor modification. Recommend maintain blood pressure goal <130/80, diabetes with hemoglobin A1c goal below 7.0% and lipids with LDL cholesterol goal below 70 mg/dL.  - check BP at home and record and bring over to PCP for medication adjustment if needed   - follow up with Dr. Jaynee Eagles in 3 months.   I spent more than 25 minutes of face to face time with the patient. Greater than 50% of time was spent in counseling and coordination of care. We discussed starting at increase, BP monitoring at home, continued PT OT, follow-up with orthopedic.   No orders of the defined types were placed in this encounter.   Meds ordered this encounter  Medications  . gabapentin (NEURONTIN) 300 MG capsule  . atorvastatin (LIPITOR) 40 MG tablet  . pentoxifylline (TRENTAL) 400 MG CR tablet  . apixaban (ELIQUIS)  2.5 MG TABS tablet    Sig: Take 1 tablet (2.5 mg total) by mouth 2 (two) times daily.    Dispense:  60 tablet    Refill:  2    Patient Instructions  -  start eliquis 2.5mg  twice a day and stop the ASA once your start eliquis - continue follow up with orthopedics for both knee treatment - continue follow up with cardiology for afib management. - recommend oncology follow up but not in rush at this time - recommend yearly monitoring with carotid doppler for carotid stenosis - continue home PT/OT, once both knee pain better, recommend outpatient PT/OT - Follow up with your primary care physician for stroke risk factor modification. Recommend maintain blood pressure goal <130/80, diabetes with hemoglobin A1c goal below 7.0% and lipids with LDL cholesterol goal below 70 mg/dL.  - follow up with Dr. Jaynee Eagles in 3 months.    Rosalin Hawking, MD PhD [facility] [address] [place], [place] [address] [phone]

## 2015-10-28 DIAGNOSIS — I10 Essential (primary) hypertension: Secondary | ICD-10-CM | POA: Diagnosis not present

## 2015-10-28 DIAGNOSIS — G9009 Other idiopathic peripheral autonomic neuropathy: Secondary | ICD-10-CM | POA: Diagnosis not present

## 2015-10-28 DIAGNOSIS — Z79899 Other long term (current) drug therapy: Secondary | ICD-10-CM | POA: Diagnosis not present

## 2015-10-28 DIAGNOSIS — Z9181 History of falling: Secondary | ICD-10-CM | POA: Diagnosis not present

## 2015-10-28 DIAGNOSIS — I4891 Unspecified atrial fibrillation: Secondary | ICD-10-CM | POA: Diagnosis not present

## 2015-10-29 ENCOUNTER — Telehealth: Payer: Self-pay | Admitting: Neurology

## 2015-10-29 NOTE — Telephone Encounter (Signed)
LFt vm for patients daughter that  rx cannot be call into [facility] on [address] for 30 days. Pts daughter will have to come by and get a 30 day rx. Pt is currently in assistance living home.

## 2015-10-29 NOTE — Telephone Encounter (Signed)
Rn call [facility] at [facility] and spoke with pharmacist. Rn tried to call in a 30 day order for eliquis per daughter. While on the phone pts name and birthdate was given. The pharmacy staff stated pt was not in system. Rn verbalized understanding.

## 2015-10-29 NOTE — Telephone Encounter (Signed)
Patient's daughter is calling to get a Rx for apixaban (ELIQUIS) 2.5 MG TABS tablet #30 called to [facility] @ [address] for the patient. The patient has a coupon for a free 30 day supply.

## 2015-10-29 NOTE — Telephone Encounter (Signed)
Rn spoke with patients daughter about 30 day eliquis with coupon.Pt is currently in assistance living and [facility] pharmacy does not accept free coupons for medications. Pts  daughter stated pts assistance living pharmacy will continue doing medications for eliquis. Pts daughter just want the free 30 day rx to be call or sent only for 30 days.

## 2015-10-30 NOTE — Telephone Encounter (Signed)
If daughter calls back tell her the order has to be sent to [facility] in the shops at [address]. Rn spoke with Mia Creek the pharmacist.

## 2015-10-30 NOTE — Telephone Encounter (Signed)
Daughter [name] returned [place] Meyer's call, requests that 30 day free trial of Eliquis be called into [facility] in the shops at [facility], not [facility].

## 2015-10-30 NOTE — Telephone Encounter (Signed)
Rn spoke with Mia Creek at [facility] in the shops at [facility] at [phone]. Rn gave [facility] pharmacist verbal order of eliquis 2.5mg  one tablet twice a day for 30 days with no refills. Rn explain the daughter has a coupon. Verbal order given per Dr. Erlinda Hong.

## 2015-10-30 NOTE — Telephone Encounter (Signed)
Pt's daughter called back. msg relayed  [id number]

## 2015-11-04 DIAGNOSIS — Z9181 History of falling: Secondary | ICD-10-CM | POA: Diagnosis not present

## 2015-11-04 DIAGNOSIS — R269 Unspecified abnormalities of gait and mobility: Secondary | ICD-10-CM | POA: Diagnosis not present

## 2015-11-04 DIAGNOSIS — I10 Essential (primary) hypertension: Secondary | ICD-10-CM | POA: Diagnosis not present

## 2015-11-04 DIAGNOSIS — Z79899 Other long term (current) drug therapy: Secondary | ICD-10-CM | POA: Diagnosis not present

## 2015-11-11 DIAGNOSIS — M25569 Pain in unspecified knee: Secondary | ICD-10-CM | POA: Diagnosis not present

## 2015-11-11 DIAGNOSIS — Z9181 History of falling: Secondary | ICD-10-CM | POA: Diagnosis not present

## 2015-11-11 DIAGNOSIS — G894 Chronic pain syndrome: Secondary | ICD-10-CM | POA: Diagnosis not present

## 2015-11-11 DIAGNOSIS — Z79899 Other long term (current) drug therapy: Secondary | ICD-10-CM | POA: Diagnosis not present

## 2015-11-11 DIAGNOSIS — R42 Dizziness and giddiness: Secondary | ICD-10-CM | POA: Diagnosis not present

## 2015-11-11 DIAGNOSIS — I10 Essential (primary) hypertension: Secondary | ICD-10-CM | POA: Diagnosis not present

## 2015-11-11 DIAGNOSIS — I4891 Unspecified atrial fibrillation: Secondary | ICD-10-CM | POA: Diagnosis not present

## 2015-11-19 ENCOUNTER — Ambulatory Visit: Payer: Medicare Other | Admitting: Neurology

## 2015-11-20 ENCOUNTER — Other Ambulatory Visit: Payer: Medicare Other

## 2015-11-25 DIAGNOSIS — R197 Diarrhea, unspecified: Secondary | ICD-10-CM | POA: Diagnosis not present

## 2015-11-25 DIAGNOSIS — I10 Essential (primary) hypertension: Secondary | ICD-10-CM | POA: Diagnosis not present

## 2015-12-09 ENCOUNTER — Ambulatory Visit (INDEPENDENT_AMBULATORY_CARE_PROVIDER_SITE_OTHER): Payer: Medicare HMO | Admitting: Orthopaedic Surgery

## 2015-12-09 DIAGNOSIS — M1712 Unilateral primary osteoarthritis, left knee: Secondary | ICD-10-CM | POA: Diagnosis not present

## 2015-12-09 DIAGNOSIS — M1711 Unilateral primary osteoarthritis, right knee: Secondary | ICD-10-CM

## 2015-12-19 DIAGNOSIS — M170 Bilateral primary osteoarthritis of knee: Secondary | ICD-10-CM | POA: Diagnosis not present

## 2015-12-19 DIAGNOSIS — M6281 Muscle weakness (generalized): Secondary | ICD-10-CM | POA: Diagnosis not present

## 2015-12-19 DIAGNOSIS — R2681 Unsteadiness on feet: Secondary | ICD-10-CM | POA: Diagnosis not present

## 2015-12-23 DIAGNOSIS — R2681 Unsteadiness on feet: Secondary | ICD-10-CM | POA: Diagnosis not present

## 2015-12-23 DIAGNOSIS — M6281 Muscle weakness (generalized): Secondary | ICD-10-CM | POA: Diagnosis not present

## 2015-12-23 DIAGNOSIS — M170 Bilateral primary osteoarthritis of knee: Secondary | ICD-10-CM | POA: Diagnosis not present

## 2015-12-24 DIAGNOSIS — M170 Bilateral primary osteoarthritis of knee: Secondary | ICD-10-CM | POA: Diagnosis not present

## 2015-12-24 DIAGNOSIS — M6281 Muscle weakness (generalized): Secondary | ICD-10-CM | POA: Diagnosis not present

## 2015-12-24 DIAGNOSIS — R2681 Unsteadiness on feet: Secondary | ICD-10-CM | POA: Diagnosis not present

## 2015-12-26 DIAGNOSIS — R2681 Unsteadiness on feet: Secondary | ICD-10-CM | POA: Diagnosis not present

## 2015-12-26 DIAGNOSIS — M6281 Muscle weakness (generalized): Secondary | ICD-10-CM | POA: Diagnosis not present

## 2015-12-26 DIAGNOSIS — M170 Bilateral primary osteoarthritis of knee: Secondary | ICD-10-CM | POA: Diagnosis not present

## 2015-12-30 DIAGNOSIS — M170 Bilateral primary osteoarthritis of knee: Secondary | ICD-10-CM | POA: Diagnosis not present

## 2015-12-30 DIAGNOSIS — M6281 Muscle weakness (generalized): Secondary | ICD-10-CM | POA: Diagnosis not present

## 2015-12-30 DIAGNOSIS — R2681 Unsteadiness on feet: Secondary | ICD-10-CM | POA: Diagnosis not present

## 2015-12-31 DIAGNOSIS — M6281 Muscle weakness (generalized): Secondary | ICD-10-CM | POA: Diagnosis not present

## 2015-12-31 DIAGNOSIS — R2681 Unsteadiness on feet: Secondary | ICD-10-CM | POA: Diagnosis not present

## 2015-12-31 DIAGNOSIS — M170 Bilateral primary osteoarthritis of knee: Secondary | ICD-10-CM | POA: Diagnosis not present

## 2016-01-02 DIAGNOSIS — M6281 Muscle weakness (generalized): Secondary | ICD-10-CM | POA: Diagnosis not present

## 2016-01-02 DIAGNOSIS — R2681 Unsteadiness on feet: Secondary | ICD-10-CM | POA: Diagnosis not present

## 2016-01-02 DIAGNOSIS — M170 Bilateral primary osteoarthritis of knee: Secondary | ICD-10-CM | POA: Diagnosis not present

## 2016-01-07 ENCOUNTER — Emergency Department (HOSPITAL_COMMUNITY)
Admission: EM | Admit: 2016-01-07 | Discharge: 2016-01-07 | Disposition: A | Discharge status: Home / Self Care | Payer: Medicare HMO | Attending: Emergency Medicine | Admitting: Emergency Medicine

## 2016-01-07 ENCOUNTER — Emergency Department (HOSPITAL_COMMUNITY): Payer: Medicare HMO

## 2016-01-07 ENCOUNTER — Encounter (HOSPITAL_COMMUNITY): Payer: Self-pay

## 2016-01-07 DIAGNOSIS — R4182 Altered mental status, unspecified: Secondary | ICD-10-CM | POA: Diagnosis not present

## 2016-01-07 DIAGNOSIS — R51 Headache: Secondary | ICD-10-CM | POA: Diagnosis not present

## 2016-01-07 DIAGNOSIS — B9689 Other specified bacterial agents as the cause of diseases classified elsewhere: Secondary | ICD-10-CM | POA: Diagnosis not present

## 2016-01-07 DIAGNOSIS — Z79899 Other long term (current) drug therapy: Secondary | ICD-10-CM | POA: Diagnosis not present

## 2016-01-07 DIAGNOSIS — N3000 Acute cystitis without hematuria: Secondary | ICD-10-CM | POA: Diagnosis not present

## 2016-01-07 DIAGNOSIS — I10 Essential (primary) hypertension: Secondary | ICD-10-CM | POA: Insufficient documentation

## 2016-01-07 DIAGNOSIS — R0602 Shortness of breath: Secondary | ICD-10-CM | POA: Diagnosis not present

## 2016-01-07 DIAGNOSIS — Z7901 Long term (current) use of anticoagulants: Secondary | ICD-10-CM | POA: Diagnosis not present

## 2016-01-07 DIAGNOSIS — Z8673 Personal history of transient ischemic attack (TIA), and cerebral infarction without residual deficits: Secondary | ICD-10-CM | POA: Diagnosis not present

## 2016-01-07 DIAGNOSIS — R531 Weakness: Secondary | ICD-10-CM | POA: Diagnosis not present

## 2016-01-07 DIAGNOSIS — R5383 Other fatigue: Secondary | ICD-10-CM | POA: Diagnosis present

## 2016-01-07 DIAGNOSIS — R404 Transient alteration of awareness: Secondary | ICD-10-CM | POA: Diagnosis not present

## 2016-01-07 HISTORY — DX: Major depressive disorder, single episode, unspecified: F32.9

## 2016-01-07 HISTORY — DX: Gastro-esophageal reflux disease without esophagitis: K21.9

## 2016-01-07 HISTORY — DX: Heart failure, unspecified: I50.9

## 2016-01-07 LAB — BASIC METABOLIC PANEL
Anion gap: 9 (ref 5–15)
BUN: 12 mg/dL (ref 6–20)
CALCIUM: 9.8 mg/dL (ref 8.9–10.3)
CO2: 24 mmol/L (ref 22–32)
CREATININE: 0.79 mg/dL (ref 0.44–1.00)
Chloride: 107 mmol/L (ref 101–111)
GFR calc non Af Amer: >60 mL/min (ref >60)
Glucose, Bld: 108 mg/dL — ABNORMAL HIGH (ref 65–99)
Potassium: 4.1 mmol/L (ref 3.5–5.1)
SODIUM: 140 mmol/L (ref 135–145)

## 2016-01-07 LAB — URINE MICROSCOPIC-ADD ON: RBC / HPF: NONE SEEN RBC/hpf (ref 0–5)

## 2016-01-07 LAB — I-STAT TROPONIN, ED: TROPONIN I, POC: 0.01 ng/mL (ref 0.00–0.08)

## 2016-01-07 LAB — CBC WITH DIFFERENTIAL/PLATELET
BASOS PCT: 0 %
Basophils Absolute: 0 10*3/uL (ref 0.0–0.1)
EOS ABS: 0.1 10*3/uL (ref 0.0–0.7)
EOS PCT: 1 %
HCT: 39.9 % (ref 36.0–46.0)
HEMOGLOBIN: 13.4 g/dL (ref 12.0–15.0)
Lymphocytes Relative: 11 %
Lymphs Abs: 1.1 10*3/uL (ref 0.7–4.0)
MCH: 28.7 pg (ref 26.0–34.0)
MCHC: 33.6 g/dL (ref 30.0–36.0)
MCV: 85.4 fL (ref 78.0–100.0)
MONOS PCT: 6 %
Monocytes Absolute: 0.6 10*3/uL (ref 0.1–1.0)
NEUTROS PCT: 81 %
Neutro Abs: 8.2 10*3/uL — ABNORMAL HIGH (ref 1.7–7.7)
PLATELETS: 153 10*3/uL (ref 150–400)
RBC: 4.67 MIL/uL (ref 3.87–5.11)
RDW: 16.1 % — ABNORMAL HIGH (ref 11.5–15.5)
WBC: 10 10*3/uL (ref 4.0–10.5)

## 2016-01-07 LAB — URINALYSIS, ROUTINE W REFLEX MICROSCOPIC
BILIRUBIN URINE: NEGATIVE
Glucose, UA: NEGATIVE mg/dL
Hgb urine dipstick: NEGATIVE
Ketones, ur: NEGATIVE mg/dL
NITRITE: POSITIVE — AB
PROTEIN: 30 mg/dL — AB
SPECIFIC GRAVITY, URINE: 1.008 (ref 1.005–1.030)
pH: 7 (ref 5.0–8.0)

## 2016-01-07 MED ORDER — CEPHALEXIN 250 MG PO CAPS
500.0000 mg | ORAL_CAPSULE | Freq: Once | ORAL | Status: AC
  Administered 2016-01-07: 500 mg via ORAL
  Filled 2016-01-07: qty 2

## 2016-01-07 MED ORDER — CEPHALEXIN 500 MG PO CAPS: 500.0000 mg | ORAL_CAPSULE | Freq: Two times a day (BID) | ORAL | 0 refills | Status: DC

## 2016-01-07 NOTE — ED Provider Notes (Signed)
[facility] DEPT Provider Note   CSN: [id number] Arrival date & time: 01/07/16  1021     History   Chief Complaint Chief Complaint  Patient presents with  . Fatigue  . Bradycardia    HPI Virginia Meyer is a 82 y.o. female who presents with a transient alteration in awareness. Past medical history significant for CVA with left-sided weakness, atrial fibrillation on anticoagulation, PVD, hypertension, hyperlipidemia, heart failure, GERD, anxiety/depression. Patient states that she is unsure why she is here. Daughter and son are at bedside. Son was called and states that the staff told them she had an episode where she was staring off and was unresponsive but did not appear unconscious. Patient states that she feels fine. She reports a mild headache. She denies syncope, vision changes, numbness or tingling, unilateral weakness, lightheadedness/dizziness, chest pain, shortness of breath, abdominal pain, nausea, vomiting, dysuria. She is not ambulatory at baseline. Called cursing staff from [facility]. Apparently her the patient told her aide that she was fatigued and didn't feel right. She also complained of a headache and was not talking like she normally does only giving one word responses to questions and answering slowly. She was noted to be hypotensive with reading of 60/40 and pulse of 40.   HPI  Past Medical History:  Diagnosis Date  . Anxiety   . GERD (gastroesophageal reflux disease)   . Heart failure ([place])   . HLD (hyperlipidemia)   . HTN (hypertension)   . Hyperlipidemia   . Major depressive disorder   . Neuropathy ([place])   . PVD (peripheral vascular disease) ([place])   . Stroke [facility])    left sided deficit    Patient Active Problem List   Diagnosis Date Noted  . Benign essential HTN 08/21/2015  . Acute CVA (cerebrovascular accident) ([place]) 08/04/2015  . Left hemiparesis ([place]) 08/04/2015  . Primary osteoarthritis of left knee 08/04/2015  . Esophageal reflux  08/04/2015  . Hyperlipidemia LDL goal <70 08/04/2015  . Atrial fibrillation ([place]) 08/04/2015  . PVD (peripheral vascular disease) ([place]) 08/04/2015  . Protein-calorie malnutrition ([place]) 08/04/2015    Past Surgical History:  Procedure Laterality Date  . ABDOMINAL HYSTERECTOMY    . FOOT NEUROMA SURGERY    . SPINE SURGERY    . TONSILLECTOMY      OB History    No data available       Home Medications    Prior to Admission medications   Medication Sig Start Date End Date Taking? Authorizing Provider  acetaminophen (TYLENOL) 500 MG tablet Take 500 mg by mouth 2 (two) times daily. Left knee    Historical Provider, MD  apixaban (ELIQUIS) 2.5 MG TABS tablet Take 1 tablet (2.5 mg total) by mouth 2 (two) times daily. 10/16/15   Rosalin Hawking, MD  atorvastatin (LIPITOR) 40 MG tablet  09/02/15   Historical Provider, MD  carvedilol (COREG) 12.5 MG tablet Take 12.5 mg by mouth every 12 (twelve) hours.    Historical Provider, MD  digoxin (LANOXIN) 0.125 MG tablet Take 0.125 mg by mouth daily.    Historical Provider, MD  diltiazem (CARDIZEM) 60 MG tablet Take 60 mg by mouth every 8 (eight) hours.    Historical Provider, MD  doxazosin (CARDURA) 1 MG tablet Take 1 mg by mouth at bedtime.    Historical Provider, MD  famotidine (PEPCID) 20 MG tablet Take 20 mg by mouth daily.     Historical Provider, MD  gabapentin (NEURONTIN) 300 MG capsule  10/02/15   Historical Provider,  MD  gabapentin (NEURONTIN) 600 MG tablet Take 600 mg by mouth daily. 1 tablet in am for neuropathy 2 tablets in the pm    Historical Provider, MD  hydrALAZINE (APRESOLINE) 10 MG tablet Take 10 mg by mouth. Give 1/2 tab po every 12 hr prn for HTN 09/10/15   Historical Provider, MD  Menthol, Topical Analgesic, (BIOFREEZE) 4 % GEL Apply 1 application topically 2 (two) times daily. Left knee for pain    Historical Provider, MD  pentoxifylline (TRENTAL) 400 MG CR tablet  08/29/15   Historical Provider, MD    Family History Family History    Problem Relation Age of Onset  . Heart attack Brother   . Stroke Neg Hx   . Neuropathy Neg Hx     Social History Social History  Substance Use Topics  . Smoking status: Never Smoker  . Smokeless tobacco: Never Used  . Alcohol use No     Allergies   Patient has no known allergies.   Review of Systems Review of Systems  Constitutional: Positive for fatigue. Negative for chills and fever.  Eyes: Negative for visual disturbance.  Respiratory: Negative for shortness of breath.   Cardiovascular: Negative for chest pain.  Gastrointestinal: Negative for abdominal pain, nausea and vomiting.  Genitourinary: Negative for dysuria.  Musculoskeletal: Negative for neck pain.  Neurological: Positive for headaches. Negative for dizziness, syncope, weakness and light-headedness.  Psychiatric/Behavioral: Positive for confusion.  All other systems reviewed and are negative.    Physical Exam Updated Vital Signs BP 169/59   Pulse (!) 48   Temp 97.6 F (36.4 C) (Oral)   Resp 12   SpO2 98%   Physical Exam  Constitutional: She is oriented to person, place, and time. She appears well-developed and well-nourished. No distress.  HENT:  Head: Normocephalic and atraumatic.  Eyes: Conjunctivae are normal. Pupils are equal, round, and reactive to light. Right eye exhibits no discharge. Left eye exhibits no discharge. No scleral icterus.  Neck: Normal range of motion.  Cardiovascular: Normal rate and regular rhythm.  Exam reveals no gallop and no friction rub.   No murmur heard. Pulmonary/Chest: Effort normal and breath sounds normal. No respiratory distress. She has no wheezes. She has no rales. She exhibits no tenderness.  Abdominal: She exhibits no distension.  Neurological: She is alert and oriented to person, place, and time.  Mental Status:  Alert, oriented, thought content appropriate, able to give a coherent history. Speech fluent without evidence of aphasia. Able to follow 2 step  commands without difficulty.  Cranial Nerves:  II:  Peripheral visual fields grossly normal, right pupil is slightly smaller than left. Both are reactive to light. III,IV, VI: ptosis not present, extra-ocular motions intact bilaterally  V,VII: Residual facial droop on left noted. Facial light touch sensation equal VIII: Hard of hearing X: uvula elevates symmetrically  XI: bilateral shoulder shrug symmetric and strong XII: midline tongue extension without fassiculations Motor:  Normal tone. 5/5 in upper extremities bilaterally including strong and equal grip strength. 3/5 strength in lower extremities bilaterally Sensory: Pinprick and light touch normal in all extremities.  Cerebellar: normal finger-to-nose with bilateral upper extremities Gait: deferred - patient is not ambulatory CV: distal pulses palpable throughout    Skin: Skin is warm and dry.  Psychiatric: She has a normal mood and affect. Her behavior is normal.  Nursing note and vitals reviewed.    ED Treatments / Results  Labs (all labs ordered are listed, but only abnormal results are displayed)  Labs Reviewed  BASIC METABOLIC PANEL - Abnormal; Notable for the following:       Result Value   Glucose, Bld 108 (*)    All other components within normal limits  CBC WITH DIFFERENTIAL/PLATELET - Abnormal; Notable for the following:    RDW 16.1 (*)    Neutro Abs 8.2 (*)    All other components within normal limits  URINALYSIS, ROUTINE W REFLEX MICROSCOPIC (NOT AT [facility]) - Abnormal; Notable for the following:    APPearance HAZY (*)    Protein, ur 30 (*)    Nitrite POSITIVE (*)    Leukocytes, UA SMALL (*)    All other components within normal limits  URINE MICROSCOPIC-ADD ON - Abnormal; Notable for the following:    Squamous Epithelial / LPF 0-5 (*)    Bacteria, UA MANY (*)    All other components within normal limits  URINE CULTURE  I-STAT TROPOININ, ED    EKG  EKG Interpretation None       Radiology Dg Chest 2  View  Result Date: 01/07/2016 CLINICAL DATA:  Altered mental status, weakness, shortness of breath. History of atrial fibrillation, hyperlipidemia. EXAM: CHEST  2 VIEW COMPARISON:  None in PACs FINDINGS: The lungs are adequately inflated and clear. The heart and pulmonary vascularity are normal. There is a trace of blunting of the posterior costophrenic angles. There is calcification in the wall of the aortic arch. There is prominent thoracic kyphosis with mild loss of height of several mid thoracic vertebral bodies. There is severe degenerative change of the right shoulder. IMPRESSION: There is no pneumonia nor CHF. Mild cardiomegaly without pulmonary edema. Trace bilateral pleural effusions layering posteriorly. Thoracic aortic atherosclerosis. Electronically Signed   By: David  Martinique M.D.   On: 01/07/2016 14:23   Ct Head Wo Contrast  Result Date: 01/07/2016 CLINICAL DATA:  Altered mental status. EXAM: CT HEAD WITHOUT CONTRAST TECHNIQUE: Contiguous axial images were obtained from the base of the skull through the vertex without intravenous contrast. COMPARISON:  None. FINDINGS: Riggi: Right temporal parietal encephalomalacia is noted consistent with old infarction. Mild chronic ischemic white matter disease is noted. No mass effect or midline shift is noted. Ventricular size is within normal limits. There is no evidence of mass lesion, hemorrhage or acute infarction. Vascular: Atherosclerosis of carotid siphons is noted. Skull: Bony calvarium appears intact. Sinuses/Orbits: Visualized paranasal sinuses appear normal. Other: None. IMPRESSION: Old right MCA infarction.  No acute intracranial abnormality seen. Electronically Signed   By: Marijo Conception, M.D.   On: 01/07/2016 12:47    Procedures Procedures (including critical care time)  Medications Ordered in ED Medications  cephALEXin (KEFLEX) capsule 500 mg (500 mg Oral Given 01/07/16 1628)     Initial Impression / Assessment and Plan / ED  Course  I have reviewed the triage vital signs and the nursing notes.  Pertinent labs & imaging results that were available during my care of the patient were reviewed by me and considered in my medical decision making (see chart for details).  Clinical Course    82 year old female with UTI. Patient is afebrile, not tachycardic or tachypneic, and not hypoxic. She is hypertensive. CBC unremarkable. BMP unremarkable. UA remarkable for 30 protein, positive nitrites, small leukocytes, many bacteria. Culture sent. Patient started on Keflex. EKG is sinus bradycardia which may be the cause of her fatigue. She does take a beta blocker. Bradycardia has improved throughout ED stay. CXR unremarkable. CT negative for any new pathology. Shared visit  with Dr. Sherry Ruffing. Will d/c back to nursing home. Opportunity for questions provided and all questions answered. Return precautions given.    Final Clinical Impressions(s) / ED Diagnoses   Final diagnoses:  Acute cystitis without hematuria    New Prescriptions New Prescriptions   No medications on file     Recardo Evangelist, PA-C 01/08/16 [id number]    Gwenyth Allegra Tegeler, MD 01/08/16 1352

## 2016-01-07 NOTE — ED Triage Notes (Signed)
Per [facility]: Pt is from [facility] senior living - Per staff pt was lethargic, had alerted mental status. With EMS alert and oriented X 4. Pt had sinus bradycardia with no ectopy. EMS gave 250 ml of saline to see if that would improve the bradycardia, it did not help with the bradycardia. Pt has no new complaints. Pt did take her beta blocker this morning. Pt was nauseated when she was in the ambulance, resolved when she was able to get out of the ambulance.

## 2016-01-07 NOTE — ED Notes (Signed)
Patient transported to CT 

## 2016-01-07 NOTE — ED Notes (Addendum)
Patient at ambulation baseline

## 2016-01-07 NOTE — Discharge Instructions (Signed)
Take antibiotic for urinary infection

## 2016-01-07 NOTE — ED Notes (Signed)
EKG given to Dr Tegeler. 

## 2016-01-08 DIAGNOSIS — R2681 Unsteadiness on feet: Secondary | ICD-10-CM | POA: Diagnosis not present

## 2016-01-08 DIAGNOSIS — M6281 Muscle weakness (generalized): Secondary | ICD-10-CM | POA: Diagnosis not present

## 2016-01-08 DIAGNOSIS — M170 Bilateral primary osteoarthritis of knee: Secondary | ICD-10-CM | POA: Diagnosis not present

## 2016-01-09 LAB — URINE CULTURE

## 2016-01-10 ENCOUNTER — Telehealth (HOSPITAL_BASED_OUTPATIENT_CLINIC_OR_DEPARTMENT_OTHER): Payer: Self-pay

## 2016-01-10 NOTE — Progress Notes (Signed)
ED Antimicrobial Stewardship Positive Culture Follow Up   Virginia Meyer is an 82 y.o. female who presented to [facility] on 01/07/2016 with a chief complaint of  Chief Complaint  Patient presents with  . Fatigue  . Bradycardia    Recent Results (from the past 720 hour(s))  Urine culture     Status: Abnormal   Collection Time: 01/07/16  3:01 PM  Result Value Ref Range Status   Specimen Description URINE, CATHETERIZED  Final   Special Requests NONE  Final   Culture >=100,000 COLONIES/mL CITROBACTER FREUNDII (A)  Final   Report Status 01/09/2016 FINAL  Final   Organism ID, Bacteria CITROBACTER FREUNDII (A)  Final      Susceptibility   Citrobacter freundii - MIC*    CEFAZOLIN >=64 RESISTANT Resistant     CEFTRIAXONE <=1 SENSITIVE Sensitive     CIPROFLOXACIN <=0.25 SENSITIVE Sensitive     GENTAMICIN <=1 SENSITIVE Sensitive     IMIPENEM 1 SENSITIVE Sensitive     NITROFURANTOIN <=16 SENSITIVE Sensitive     TRIMETH/SULFA <=20 SENSITIVE Sensitive     PIP/TAZO <=4 SENSITIVE Sensitive     * >=100,000 COLONIES/mL CITROBACTER FREUNDII    [x]  Treated with Keflex, organism resistant to prescribed antimicrobial []  Patient discharged originally without antimicrobial agent and treatment is now indicated  New antibiotic prescription: Bactrim 800/160 mg po BID x 3 days ED Provider: Dyann Kief, PA-C     Hughes Better, PharmD, BCPS Clinical Pharmacist 01/10/2016 9:08 AM

## 2016-01-10 NOTE — Telephone Encounter (Signed)
Post ED Visit - Positive Culture Follow-up: Unsuccessful Patient Follow-up  Culture assessed and recommendations reviewed by: []  Elenor Quinones, Pharm.D. []  Heide Guile, Pharm.D., BCPS []  Parks Neptune, Pharm.D. []  Alycia Rossetti, Pharm.D., BCPS []  [place], Pharm.D., BCPS, AAHIVP []  Legrand Como, Pharm.D., BCPS, AAHIVP []  Milus Glazier, Pharm.D. []  Stephens November, Pharm.D. Ebony Hail Masters Pharm D Positive urine culture  []  Patient discharged without antimicrobial prescription and treatment is now indicated [x]  Organism is resistant to prescribed ED discharge antimicrobial []  Patient with positive blood cultures   Unable to contact patient after 3 attempts, letter will be sent to address on file  Genia Del 01/10/2016, 10:44 AM

## 2016-01-13 DIAGNOSIS — M170 Bilateral primary osteoarthritis of knee: Secondary | ICD-10-CM | POA: Diagnosis not present

## 2016-01-13 DIAGNOSIS — R2681 Unsteadiness on feet: Secondary | ICD-10-CM | POA: Diagnosis not present

## 2016-01-13 DIAGNOSIS — M6281 Muscle weakness (generalized): Secondary | ICD-10-CM | POA: Diagnosis not present

## 2016-01-20 DIAGNOSIS — M6281 Muscle weakness (generalized): Secondary | ICD-10-CM | POA: Diagnosis not present

## 2016-01-20 DIAGNOSIS — R2681 Unsteadiness on feet: Secondary | ICD-10-CM | POA: Diagnosis not present

## 2016-01-20 DIAGNOSIS — M170 Bilateral primary osteoarthritis of knee: Secondary | ICD-10-CM | POA: Diagnosis not present

## 2016-01-26 ENCOUNTER — Telehealth (HOSPITAL_BASED_OUTPATIENT_CLINIC_OR_DEPARTMENT_OTHER): Payer: Self-pay | Admitting: Emergency Medicine

## 2016-01-26 NOTE — Telephone Encounter (Signed)
No response to letter, LOST TO FOLLOWUP 

## 2016-01-27 ENCOUNTER — Ambulatory Visit: Payer: Medicare Other | Admitting: Neurology

## 2016-02-04 DIAGNOSIS — R69 Illness, unspecified: Secondary | ICD-10-CM | POA: Diagnosis not present

## 2016-02-04 DIAGNOSIS — F341 Dysthymic disorder: Secondary | ICD-10-CM | POA: Diagnosis not present

## 2016-02-04 DIAGNOSIS — R454 Irritability and anger: Secondary | ICD-10-CM | POA: Diagnosis not present

## 2016-02-18 DIAGNOSIS — R69 Illness, unspecified: Secondary | ICD-10-CM | POA: Diagnosis not present

## 2016-02-18 DIAGNOSIS — F341 Dysthymic disorder: Secondary | ICD-10-CM | POA: Diagnosis not present

## 2016-02-18 DIAGNOSIS — R454 Irritability and anger: Secondary | ICD-10-CM | POA: Diagnosis not present

## 2016-02-24 DIAGNOSIS — I10 Essential (primary) hypertension: Secondary | ICD-10-CM | POA: Diagnosis not present

## 2016-02-24 DIAGNOSIS — R296 Repeated falls: Secondary | ICD-10-CM | POA: Diagnosis not present

## 2016-02-24 DIAGNOSIS — R69 Illness, unspecified: Secondary | ICD-10-CM | POA: Diagnosis not present

## 2016-02-24 DIAGNOSIS — G629 Polyneuropathy, unspecified: Secondary | ICD-10-CM | POA: Diagnosis not present

## 2016-02-24 DIAGNOSIS — I4891 Unspecified atrial fibrillation: Secondary | ICD-10-CM | POA: Diagnosis not present

## 2016-02-24 DIAGNOSIS — Z993 Dependence on wheelchair: Secondary | ICD-10-CM | POA: Diagnosis not present

## 2016-03-17 DIAGNOSIS — I69359 Hemiplegia and hemiparesis following cerebral infarction affecting unspecified side: Secondary | ICD-10-CM | POA: Diagnosis not present

## 2016-03-17 DIAGNOSIS — Z993 Dependence on wheelchair: Secondary | ICD-10-CM | POA: Diagnosis not present

## 2016-03-17 DIAGNOSIS — I4891 Unspecified atrial fibrillation: Secondary | ICD-10-CM | POA: Diagnosis not present

## 2016-03-17 DIAGNOSIS — R69 Illness, unspecified: Secondary | ICD-10-CM | POA: Diagnosis not present

## 2016-03-17 DIAGNOSIS — I10 Essential (primary) hypertension: Secondary | ICD-10-CM | POA: Diagnosis not present

## 2016-03-17 DIAGNOSIS — G894 Chronic pain syndrome: Secondary | ICD-10-CM | POA: Diagnosis not present

## 2016-03-17 DIAGNOSIS — F341 Dysthymic disorder: Secondary | ICD-10-CM | POA: Diagnosis not present

## 2016-03-17 DIAGNOSIS — R454 Irritability and anger: Secondary | ICD-10-CM | POA: Diagnosis not present

## 2016-03-17 DIAGNOSIS — Z7409 Other reduced mobility: Secondary | ICD-10-CM | POA: Diagnosis not present

## 2016-03-24 DIAGNOSIS — R69 Illness, unspecified: Secondary | ICD-10-CM | POA: Diagnosis not present

## 2016-03-24 DIAGNOSIS — R454 Irritability and anger: Secondary | ICD-10-CM | POA: Diagnosis not present

## 2016-03-24 DIAGNOSIS — M6281 Muscle weakness (generalized): Secondary | ICD-10-CM | POA: Diagnosis not present

## 2016-03-24 DIAGNOSIS — F341 Dysthymic disorder: Secondary | ICD-10-CM | POA: Diagnosis not present

## 2016-03-26 DIAGNOSIS — M6281 Muscle weakness (generalized): Secondary | ICD-10-CM | POA: Diagnosis not present

## 2016-03-29 DIAGNOSIS — M6281 Muscle weakness (generalized): Secondary | ICD-10-CM | POA: Diagnosis not present

## 2016-03-30 DIAGNOSIS — M6281 Muscle weakness (generalized): Secondary | ICD-10-CM | POA: Diagnosis not present

## 2016-03-31 DIAGNOSIS — M6281 Muscle weakness (generalized): Secondary | ICD-10-CM | POA: Diagnosis not present

## 2016-03-31 DIAGNOSIS — R454 Irritability and anger: Secondary | ICD-10-CM | POA: Diagnosis not present

## 2016-03-31 DIAGNOSIS — R69 Illness, unspecified: Secondary | ICD-10-CM | POA: Diagnosis not present

## 2016-03-31 DIAGNOSIS — F341 Dysthymic disorder: Secondary | ICD-10-CM | POA: Diagnosis not present

## 2016-04-01 DIAGNOSIS — M6281 Muscle weakness (generalized): Secondary | ICD-10-CM | POA: Diagnosis not present

## 2016-04-05 DIAGNOSIS — M6281 Muscle weakness (generalized): Secondary | ICD-10-CM | POA: Diagnosis not present

## 2016-04-07 DIAGNOSIS — M6281 Muscle weakness (generalized): Secondary | ICD-10-CM | POA: Diagnosis not present

## 2016-04-08 ENCOUNTER — Encounter: Payer: Self-pay | Admitting: Podiatry

## 2016-04-08 ENCOUNTER — Ambulatory Visit (INDEPENDENT_AMBULATORY_CARE_PROVIDER_SITE_OTHER): Payer: Medicare HMO | Admitting: Podiatry

## 2016-04-08 DIAGNOSIS — M79605 Pain in left leg: Secondary | ICD-10-CM

## 2016-04-08 DIAGNOSIS — B351 Tinea unguium: Secondary | ICD-10-CM

## 2016-04-08 DIAGNOSIS — M79604 Pain in right leg: Secondary | ICD-10-CM

## 2016-04-08 NOTE — Progress Notes (Signed)
Subjective:     Patient ID: Virginia Meyer, female   DOB: 09/16/1928, 83 y.o.   MRN: [id number]  HPI patient presents with elongated thickened nailbeds 1-5 both feet and is in a wheelchair   Review of Systems     Objective:   Physical Exam Neurovascular status unchanged with thick yellow brittle nailbeds 1-5 both feet    Assessment:     Mycotic nail infection 1-5 both feet    Plan:     Debris painful nailbeds 1-5 both feet with no iatrogenic bleeding noted

## 2016-04-12 DIAGNOSIS — M6281 Muscle weakness (generalized): Secondary | ICD-10-CM | POA: Diagnosis not present

## 2016-04-14 DIAGNOSIS — M6281 Muscle weakness (generalized): Secondary | ICD-10-CM | POA: Diagnosis not present

## 2016-04-19 DIAGNOSIS — M6281 Muscle weakness (generalized): Secondary | ICD-10-CM | POA: Diagnosis not present

## 2016-04-20 DIAGNOSIS — M6281 Muscle weakness (generalized): Secondary | ICD-10-CM | POA: Diagnosis not present

## 2016-04-20 DIAGNOSIS — I4891 Unspecified atrial fibrillation: Secondary | ICD-10-CM | POA: Diagnosis not present

## 2016-04-20 DIAGNOSIS — R69 Illness, unspecified: Secondary | ICD-10-CM | POA: Diagnosis not present

## 2016-04-20 DIAGNOSIS — G894 Chronic pain syndrome: Secondary | ICD-10-CM | POA: Diagnosis not present

## 2016-04-20 DIAGNOSIS — M25562 Pain in left knee: Secondary | ICD-10-CM | POA: Diagnosis not present

## 2016-04-20 DIAGNOSIS — M2450 Contracture, unspecified joint: Secondary | ICD-10-CM | POA: Diagnosis not present

## 2016-04-20 DIAGNOSIS — I10 Essential (primary) hypertension: Secondary | ICD-10-CM | POA: Diagnosis not present

## 2016-04-20 DIAGNOSIS — G8190 Hemiplegia, unspecified affecting unspecified side: Secondary | ICD-10-CM | POA: Diagnosis not present

## 2016-04-20 DIAGNOSIS — I69359 Hemiplegia and hemiparesis following cerebral infarction affecting unspecified side: Secondary | ICD-10-CM | POA: Diagnosis not present

## 2016-04-21 DIAGNOSIS — R69 Illness, unspecified: Secondary | ICD-10-CM | POA: Diagnosis not present

## 2016-04-21 DIAGNOSIS — R454 Irritability and anger: Secondary | ICD-10-CM | POA: Diagnosis not present

## 2016-04-21 DIAGNOSIS — F341 Dysthymic disorder: Secondary | ICD-10-CM | POA: Diagnosis not present

## 2016-04-22 DIAGNOSIS — M6281 Muscle weakness (generalized): Secondary | ICD-10-CM | POA: Diagnosis not present

## 2016-04-23 ENCOUNTER — Telehealth: Payer: Self-pay | Admitting: Neurology

## 2016-04-23 NOTE — Telephone Encounter (Signed)
Pt's daughter called said [facility] OT has advised that botox or baclofen could possibly help to give relief to a spastic tendon behind the left knee. She said Dr Fredderick [facility] said if that could be done asap so it does not get worse. She is aware Dr A is out of the clinic today and will be back on Monday. This can wait until then.

## 2016-04-26 DIAGNOSIS — M6281 Muscle weakness (generalized): Secondary | ICD-10-CM | POA: Diagnosis not present

## 2016-04-27 ENCOUNTER — Ambulatory Visit: Payer: Medicare HMO | Admitting: Neurology

## 2016-04-28 NOTE — Telephone Encounter (Signed)
Jen please call. We can order baclofen but this is very sedating and they have to be very careful not to fall. She would need to see Dr. Krista Blue for botox and we can set up an appointment but then we have to get it approved. Let me know what daughter says thanks!

## 2016-04-28 NOTE — Telephone Encounter (Signed)
Dr. Krista Blue,  Patient is seen by me but may be a candidate for botox for spasticity. Pt's daughter called said [facility] OT has advised that botox could possibly help to give relief to a spastic tendon behind the left knee. She said Dr Fredderick [facility] said if that could be done asap so it does not get worse. I went ahead and scheduled her with you on the 22bd of this month for evaluation I hope that is ok with you! Please let me know, she is lovely.

## 2016-04-28 NOTE — Telephone Encounter (Signed)
Returned call to her daughter - patient has been scheduled for a consult with Dr. Krista Blue on 05/13/16.

## 2016-04-28 NOTE — Telephone Encounter (Signed)
Patients daughter [name] (listed on DPR) has called office in reference to message below.  Please call

## 2016-04-28 NOTE — Telephone Encounter (Signed)
Called daughter back and she would like to proceed w/ Botox injections for spasticity.

## 2016-04-29 NOTE — Telephone Encounter (Signed)
Noted, thank you

## 2016-05-07 ENCOUNTER — Encounter (HOSPITAL_COMMUNITY): Payer: Self-pay

## 2016-05-07 ENCOUNTER — Emergency Department (HOSPITAL_COMMUNITY): Payer: Medicare HMO

## 2016-05-07 ENCOUNTER — Inpatient Hospital Stay (HOSPITAL_COMMUNITY)
Admission: EM | Admit: 2016-05-07 | Discharge: 2016-05-10 | DRG: 309 | Disposition: A | Discharge status: Home / Self Care | Payer: Medicare HMO | Attending: Cardiovascular Disease | Admitting: Cardiovascular Disease

## 2016-05-07 DIAGNOSIS — Z993 Dependence on wheelchair: Secondary | ICD-10-CM

## 2016-05-07 DIAGNOSIS — I110 Hypertensive heart disease with heart failure: Secondary | ICD-10-CM | POA: Diagnosis present

## 2016-05-07 DIAGNOSIS — I442 Atrioventricular block, complete: Secondary | ICD-10-CM | POA: Diagnosis not present

## 2016-05-07 DIAGNOSIS — I10 Essential (primary) hypertension: Secondary | ICD-10-CM | POA: Diagnosis not present

## 2016-05-07 DIAGNOSIS — Z7901 Long term (current) use of anticoagulants: Secondary | ICD-10-CM | POA: Diagnosis not present

## 2016-05-07 DIAGNOSIS — F419 Anxiety disorder, unspecified: Secondary | ICD-10-CM | POA: Diagnosis present

## 2016-05-07 DIAGNOSIS — Z8249 Family history of ischemic heart disease and other diseases of the circulatory system: Secondary | ICD-10-CM

## 2016-05-07 DIAGNOSIS — I509 Heart failure, unspecified: Secondary | ICD-10-CM | POA: Diagnosis present

## 2016-05-07 DIAGNOSIS — R55 Syncope and collapse: Secondary | ICD-10-CM | POA: Diagnosis not present

## 2016-05-07 DIAGNOSIS — M1712 Unilateral primary osteoarthritis, left knee: Secondary | ICD-10-CM | POA: Diagnosis present

## 2016-05-07 DIAGNOSIS — I69354 Hemiplegia and hemiparesis following cerebral infarction affecting left non-dominant side: Secondary | ICD-10-CM

## 2016-05-07 DIAGNOSIS — Z66 Do not resuscitate: Secondary | ICD-10-CM | POA: Diagnosis present

## 2016-05-07 DIAGNOSIS — Z90710 Acquired absence of both cervix and uterus: Secondary | ICD-10-CM | POA: Diagnosis not present

## 2016-05-07 DIAGNOSIS — I499 Cardiac arrhythmia, unspecified: Secondary | ICD-10-CM | POA: Diagnosis not present

## 2016-05-07 DIAGNOSIS — R011 Cardiac murmur, unspecified: Secondary | ICD-10-CM | POA: Diagnosis not present

## 2016-05-07 DIAGNOSIS — F329 Major depressive disorder, single episode, unspecified: Secondary | ICD-10-CM | POA: Diagnosis present

## 2016-05-07 DIAGNOSIS — K219 Gastro-esophageal reflux disease without esophagitis: Secondary | ICD-10-CM | POA: Diagnosis present

## 2016-05-07 DIAGNOSIS — I480 Paroxysmal atrial fibrillation: Secondary | ICD-10-CM | POA: Diagnosis present

## 2016-05-07 DIAGNOSIS — I350 Nonrheumatic aortic (valve) stenosis: Secondary | ICD-10-CM | POA: Diagnosis not present

## 2016-05-07 DIAGNOSIS — R001 Bradycardia, unspecified: Secondary | ICD-10-CM | POA: Diagnosis not present

## 2016-05-07 DIAGNOSIS — I952 Hypotension due to drugs: Secondary | ICD-10-CM | POA: Diagnosis not present

## 2016-05-07 DIAGNOSIS — G629 Polyneuropathy, unspecified: Secondary | ICD-10-CM | POA: Diagnosis present

## 2016-05-07 DIAGNOSIS — T50905A Adverse effect of unspecified drugs, medicaments and biological substances, initial encounter: Secondary | ICD-10-CM

## 2016-05-07 DIAGNOSIS — Z8679 Personal history of other diseases of the circulatory system: Secondary | ICD-10-CM | POA: Diagnosis present

## 2016-05-07 DIAGNOSIS — E785 Hyperlipidemia, unspecified: Secondary | ICD-10-CM | POA: Diagnosis not present

## 2016-05-07 DIAGNOSIS — I739 Peripheral vascular disease, unspecified: Secondary | ICD-10-CM | POA: Diagnosis present

## 2016-05-07 DIAGNOSIS — I4892 Unspecified atrial flutter: Secondary | ICD-10-CM | POA: Diagnosis present

## 2016-05-07 DIAGNOSIS — I359 Nonrheumatic aortic valve disorder, unspecified: Secondary | ICD-10-CM | POA: Diagnosis not present

## 2016-05-07 LAB — I-STAT CHEM 8, ED
BUN: 30 mg/dL — ABNORMAL HIGH (ref 6–20)
CALCIUM ION: 1.03 mmol/L — AB (ref 1.15–1.40)
CHLORIDE: 110 mmol/L (ref 101–111)
Creatinine, Ser: 1.2 mg/dL — ABNORMAL HIGH (ref 0.44–1.00)
Glucose, Bld: 111 mg/dL — ABNORMAL HIGH (ref 65–99)
HEMATOCRIT: 32 % — AB (ref 36.0–46.0)
Hemoglobin: 10.9 g/dL — ABNORMAL LOW (ref 12.0–15.0)
Potassium: 4.7 mmol/L (ref 3.5–5.1)
SODIUM: 140 mmol/L (ref 135–145)
TCO2: 24 mmol/L (ref 0–100)

## 2016-05-07 LAB — CBC
HCT: 37.3 % (ref 36.0–46.0)
HEMOGLOBIN: 11.9 g/dL — AB (ref 12.0–15.0)
MCH: 29.1 pg (ref 26.0–34.0)
MCHC: 31.9 g/dL (ref 30.0–36.0)
MCV: 91.2 fL (ref 78.0–100.0)
PLATELETS: 142 10*3/uL — AB (ref 150–400)
RBC: 4.09 MIL/uL (ref 3.87–5.11)
RDW: 14.8 % (ref 11.5–15.5)
WBC: 7.7 10*3/uL (ref 4.0–10.5)

## 2016-05-07 LAB — BASIC METABOLIC PANEL
ANION GAP: 7 (ref 5–15)
BUN: 21 mg/dL — ABNORMAL HIGH (ref 6–20)
CALCIUM: 8.8 mg/dL — AB (ref 8.9–10.3)
CO2: 22 mmol/L (ref 22–32)
CREATININE: 1.14 mg/dL — AB (ref 0.44–1.00)
Chloride: 111 mmol/L (ref 101–111)
GFR, EST AFRICAN AMERICAN: 49 mL/min — AB (ref >60)
GFR, EST NON AFRICAN AMERICAN: 43 mL/min — AB (ref >60)
Glucose, Bld: 108 mg/dL — ABNORMAL HIGH (ref 65–99)
Potassium: 4.5 mmol/L (ref 3.5–5.1)
SODIUM: 140 mmol/L (ref 135–145)

## 2016-05-07 LAB — I-STAT CG4 LACTIC ACID, ED: LACTIC ACID, VENOUS: 2.71 mmol/L — AB (ref 0.5–1.9)

## 2016-05-07 LAB — I-STAT TROPONIN, ED: TROPONIN I, POC: 0 ng/mL (ref 0.00–0.08)

## 2016-05-07 LAB — DIGOXIN LEVEL: Digoxin Level: 1.4 ng/mL (ref 0.8–2.0)

## 2016-05-07 LAB — MRSA PCR SCREENING: MRSA by PCR: NEGATIVE

## 2016-05-07 MED ORDER — SODIUM CHLORIDE 0.9 % IV BOLUS (SEPSIS): 250.0000 mL | Freq: Once | INTRAVENOUS | Status: DC

## 2016-05-07 MED ORDER — GLUCAGON HCL RDNA (DIAGNOSTIC) 1 MG IJ SOLR: 10.0000 mg | Freq: Once | INTRAMUSCULAR | Status: DC

## 2016-05-07 MED ORDER — GLUCAGON HCL RDNA (DIAGNOSTIC) 1 MG IJ SOLR
3.0000 mg/h | INTRAVENOUS | Status: DC
  Administered 2016-05-07 (×5): 3 mg/h via INTRAVENOUS
  Filled 2016-05-07 (×5): qty 5

## 2016-05-07 MED ORDER — SODIUM CHLORIDE 0.9 % IV BOLUS (SEPSIS)
1000.0000 mL | Freq: Once | INTRAVENOUS | Status: AC
  Administered 2016-05-07: 1000 mL via INTRAVENOUS

## 2016-05-07 MED ORDER — GLUCAGON HCL RDNA (DIAGNOSTIC) 1 MG IJ SOLR
3.0000 mg | Freq: Once | INTRAMUSCULAR | Status: AC
  Administered 2016-05-07: 3 mg via INTRAVENOUS
  Filled 2016-05-07: qty 3

## 2016-05-07 MED ORDER — PIPERACILLIN-TAZOBACTAM 3.375 G IVPB 30 MIN
3.3750 g | Freq: Once | INTRAVENOUS | Status: AC
  Administered 2016-05-07: 3.375 g via INTRAVENOUS
  Filled 2016-05-07: qty 50

## 2016-05-07 MED ORDER — TRAMADOL HCL 50 MG PO TABS
25.0000 mg | ORAL_TABLET | Freq: Three times a day (TID) | ORAL | Status: DC | PRN
  Administered 2016-05-09 (×2): 25 mg via ORAL
  Filled 2016-05-07 (×2): qty 1

## 2016-05-07 MED ORDER — ONDANSETRON HCL 4 MG/2ML IJ SOLN
4.0000 mg | Freq: Three times a day (TID) | INTRAMUSCULAR | Status: DC | PRN
  Administered 2016-05-07 – 2016-05-08 (×2): 4 mg via INTRAVENOUS
  Filled 2016-05-07 (×2): qty 2

## 2016-05-07 MED ORDER — GLUCAGON HCL RDNA (DIAGNOSTIC) 1 MG IJ SOLR
INTRAMUSCULAR | Status: AC
  Filled 2016-05-07: qty 1

## 2016-05-07 MED ORDER — GABAPENTIN 300 MG PO CAPS: 300.0000 mg | ORAL_CAPSULE | ORAL | Status: DC

## 2016-05-07 MED ORDER — GABAPENTIN 600 MG PO TABS
600.0000 mg | ORAL_TABLET | Freq: Every day | ORAL | Status: DC
  Administered 2016-05-07 – 2016-05-09 (×3): 600 mg via ORAL
  Filled 2016-05-07 (×3): qty 1

## 2016-05-07 MED ORDER — PIPERACILLIN-TAZOBACTAM 3.375 G IVPB
3.3750 g | Freq: Three times a day (TID) | INTRAVENOUS | Status: DC
  Filled 2016-05-07: qty 50

## 2016-05-07 MED ORDER — GLUCAGON HCL RDNA (DIAGNOSTIC) 1 MG IJ SOLR
1.0000 mg | Freq: Once | INTRAMUSCULAR | Status: AC
  Administered 2016-05-07: 1 mg via INTRAVENOUS
  Filled 2016-05-07: qty 1

## 2016-05-07 MED ORDER — SERTRALINE HCL 25 MG PO TABS
25.0000 mg | ORAL_TABLET | Freq: Every day | ORAL | Status: DC
  Administered 2016-05-08 – 2016-05-09 (×2): 25 mg via ORAL
  Filled 2016-05-07 (×3): qty 1

## 2016-05-07 MED ORDER — HYDRALAZINE HCL 20 MG/ML IJ SOLN
10.0000 mg | Freq: Four times a day (QID) | INTRAMUSCULAR | Status: DC | PRN
  Administered 2016-05-09 (×2): 10 mg via INTRAVENOUS
  Filled 2016-05-07 (×2): qty 1

## 2016-05-07 MED ORDER — DOCUSATE SODIUM 100 MG PO CAPS: 100.0000 mg | ORAL_CAPSULE | Freq: Two times a day (BID) | ORAL | Status: DC | PRN

## 2016-05-07 MED ORDER — VANCOMYCIN HCL 500 MG IV SOLR: 500.0000 mg | INTRAVENOUS | Status: DC

## 2016-05-07 MED ORDER — ONDANSETRON HCL 4 MG/2ML IJ SOLN
INTRAMUSCULAR | Status: AC
  Administered 2016-05-07: 4 mg
  Filled 2016-05-07: qty 2

## 2016-05-07 MED ORDER — SODIUM CHLORIDE 0.9 % IV BOLUS (SEPSIS)
500.0000 mL | Freq: Once | INTRAVENOUS | Status: AC
  Administered 2016-05-07: 500 mL via INTRAVENOUS

## 2016-05-07 MED ORDER — GLUCAGON HCL RDNA (DIAGNOSTIC) 1 MG IJ SOLR
3.0000 mg/h | INTRAVENOUS | Status: DC
  Administered 2016-05-08: 3 mg/h via INTRAVENOUS
  Filled 2016-05-07: qty 15

## 2016-05-07 MED ORDER — GABAPENTIN 300 MG PO CAPS
300.0000 mg | ORAL_CAPSULE | Freq: Every day | ORAL | Status: DC
  Administered 2016-05-08 – 2016-05-10 (×3): 300 mg via ORAL
  Filled 2016-05-07 (×3): qty 1

## 2016-05-07 MED ORDER — APIXABAN 2.5 MG PO TABS
2.5000 mg | ORAL_TABLET | Freq: Two times a day (BID) | ORAL | Status: DC
  Administered 2016-05-07 – 2016-05-10 (×6): 2.5 mg via ORAL
  Filled 2016-05-07 (×6): qty 1

## 2016-05-07 MED ORDER — VANCOMYCIN HCL IN DEXTROSE 1-5 GM/200ML-% IV SOLN
1000.0000 mg | Freq: Once | INTRAVENOUS | Status: AC
  Administered 2016-05-07: 1000 mg via INTRAVENOUS
  Filled 2016-05-07: qty 200

## 2016-05-07 NOTE — ED Triage Notes (Signed)
Pt brought in from [facility]isted [facility] for syncope pt was found to have bradycardia than vommitted several times and had diahrrea pt was given Atropine, 639mL of fluid and than Versed just prior to being paced by EMS pt was fully awake prior to versed

## 2016-05-07 NOTE — Progress Notes (Signed)
Pharmacy Antibiotic Note  Virginia Meyer is a 83 y.o. female admitted on 05/07/2016 with sepsis.  Pharmacy has been consulted for vancomycin and zosyn dosing.  Patient received vancomycin 1g and zosyn 3.375g IV once in the ED.  Plan: Vancomycin 500mg  IV every 24 hours.  Goal trough 15-20 mcg/mL. Zosyn 3.375g IV q8h (4 hour infusion).  Monitor culture data, renal function and clinical course [facility] at [facility] prn  Height: 5' (152.4 cm) Weight: 112 lb (50.8 kg) IBW/kg (Calculated) : 45.5  No data recorded.   Recent Labs Lab 05/07/16 1231 05/07/16 1232  CREATININE 1.20*  --   LATICACIDVEN  --  2.71*    Estimated Creatinine Clearance: 24.6 mL/min (A) (by C-G formula based on SCr of 1.2 mg/dL (H)).    No Known Allergies   Andrey Cota. Diona Foley, PharmD, BCPS Clinical Pharmacist [phone] 05/07/2016 12:51 PM

## 2016-05-07 NOTE — ED Notes (Addendum)
Cardiologist at bedside speaking with family pt appears more alert and on monitor appears to have beats that are picking up

## 2016-05-07 NOTE — ED Notes (Signed)
Attempted to give report to [facility] on 4N will call back in 15 as per floor

## 2016-05-07 NOTE — H&P (Signed)
History & Physical    Patient ID: Virginia Meyer MRN: [id number], DOB/AGE: 03-01-1928   Admit date: 05/07/2016  Primary Physician: Reymundo Poll, MD Primary Cardiologist: New to [facility] - Dr. Gwenlyn Found    History of Present Illness    Virginia Meyer is a 83 y.o. female with past medical history of past medical history of PAF (on Eliquis), prior CVA, HTN, and HLD who presented to [facility] ED on 05/07/2016 in complete heart block.   She currently resides at [facility] and uses a wheelchair due to lower extremity weakness from her prior CVA's. This morning, she was noted to be experiencing diarrhea and vomiting. She later had a syncopal episode. Upon EMS arrival, her HR was in the 30's and thought to be most consistent with CHB or a junctional rhythm. Was given Atropine then externally paced. Was given Versed with this and was initially unresponsive upon arrival to the ED. She is now more alert following administration of Glucagon.  Remains on pacer pads with occasional paced beats, significantly improved when compared to initial heart rhythm.   History is mostly obtained from the daughter who reports the patient has a known history of PAF and possible CHF but no known CAD or prior MI's.   PTA medications are reviewed and include Eliquis 2.5mg  BID, Digoxin 0.125mg  daily, Coreg 18.75mg  BID, and Cardizem 60mg  Q8H.   Labs show K+ 4.7, creatinine 1.03, and initial troponin negative. CXR with no edema or consolidation. EKG with paced rhythm (external pacer in place).   Past Medical History    Past Medical History:  Diagnosis Date  . Anxiety   . GERD (gastroesophageal reflux disease)   . Heart failure ([place])   . HLD (hyperlipidemia)   . HTN (hypertension)   . Hyperlipidemia   . Major depressive disorder   . Neuropathy ([place])   . PVD (peripheral vascular disease) ([place])   . Stroke [facility])    left sided deficit    Past Surgical History:  Procedure Laterality Date  . ABDOMINAL  HYSTERECTOMY    . FOOT NEUROMA SURGERY    . SPINE SURGERY    . TONSILLECTOMY       Allergies  No Known Allergies   Home Medications    Prior to Admission medications   Medication Sig Start Date End Date Taking? Authorizing Provider  acetaminophen (TYLENOL) 500 MG tablet Take 500 mg by mouth 4 (four) times daily. Left knee    Historical Provider, MD  apixaban (ELIQUIS) 2.5 MG TABS tablet Take 1 tablet (2.5 mg total) by mouth 2 (two) times daily. 10/16/15   Rosalin Hawking, MD  carvedilol (COREG) 12.5 MG tablet Take 14 mg by mouth 2 (two) times daily.     Historical Provider, MD  cephALEXin (KEFLEX) 500 MG capsule Take 1 capsule (500 mg total) by mouth 2 (two) times daily. 01/07/16   Recardo Evangelist, PA-C  digoxin (LANOXIN) 0.125 MG tablet Take 0.125 mg by mouth daily.    Historical Provider, MD  diltiazem (CARDIZEM) 60 MG tablet Take 60 mg by mouth every 8 (eight) hours.    Historical Provider, MD  docusate sodium (COLACE) 100 MG capsule Take 100 mg by mouth every 12 (twelve) hours as needed for mild constipation.    Historical Provider, MD  gabapentin (NEURONTIN) 300 MG capsule Take 300-600 mg by mouth See admin instructions. Pt takes 300mg  daily, takes 600mg  at bedtime 10/02/15   Historical Provider, MD  hydrALAZINE (APRESOLINE) 10 MG tablet Take 10  mg by mouth 3 (three) times daily.  09/10/15   Historical Provider, MD  loperamide (IMODIUM A-D) 2 MG tablet Take 2 mg by mouth every 8 (eight) hours as needed for diarrhea or loose stools.    Historical Provider, MD  meclizine (ANTIVERT) 12.5 MG tablet Take 12.5 mg by mouth every 8 (eight) hours as needed for dizziness.    Historical Provider, MD  Menthol, Topical Analgesic, (BIOFREEZE) 4 % GEL Apply 1 application topically 4 (four) times daily. Left knee for pain     Historical Provider, MD  Olive Leaf Extract 500 MG CAPS Take 500 mg by mouth 2 (two) times daily.    Historical Provider, MD  sertraline (ZOLOFT) 25 MG tablet Take 25 mg by mouth  daily.    Historical Provider, MD  traMADol (ULTRAM) 50 MG tablet Take 25 mg by mouth every 8 (eight) hours as needed for moderate pain.    Historical Provider, MD    Family History    Family History  Problem Relation Age of Onset  . Heart attack Brother   . Stroke Neg Hx   . Neuropathy Neg Hx     Social History    Social History   Social History  . Marital status: Widowed    Spouse name: N/A  . Number of children: 4  . Years of education: 12   Occupational History  . Retired    Social History Main Topics  . Smoking status: Never Smoker  . Smokeless tobacco: Never Used  . Alcohol use No  . Drug use: No  . Sexual activity: Not on file   Other Topics Concern  . Not on file   Social History Narrative   Lives at [facility] and rehab (Fax: [phone])   Caffeine use: 1 cup tea/day   1 cup coffee/day     Review of Systems    General:  No chills, fever, night sweats or weight changes.  Cardiovascular:  No chest pain, dyspnea on exertion, edema, orthopnea, palpitations, paroxysmal nocturnal dyspnea. Dermatological: No rash, lesions/masses Respiratory: No cough, dyspnea Urologic: No hematuria, dysuria Abdominal:   No nausea, bright red blood per rectum, melena, or hematemesis. Positive for vomiting and diarrhea.  Neurologic:  No visual changes, wkns, changes in mental status. All other systems reviewed and are otherwise negative except as noted above.  Physical Exam    Blood pressure (!) 138/42, pulse (!) 58, resp. rate 16, height 5' (1.524 m), weight 112 lb (50.8 kg), SpO2 99 %.  General: Well developed, well nourished Caucasian female appearing in no acute distress. Head: Normocephalic, atraumatic, sclera non-icteric, no xanthomas, nares are without discharge. Dentition:  Neck: No carotid bruits. JVD not elevated.  Lungs: Respirations regular and unlabored, without wheezes or rales.  Heart: Regular rhythm, bradycardiac rate. No S3 or S4.  No rubs or gallops  appreciated. 2/6 SEM along RUSB. External pacer pads in place.  Abdomen: Soft, non-tender, non-distended with normoactive bowel sounds. No hepatomegaly. No rebound/guarding. No obvious abdominal masses. Msk:  Strength and tone appear normal for age. No joint deformities or effusions. Extremities: No clubbing or cyanosis. No edema.  Distal pedal pulses are 2+ bilaterally. Neuro: Alert and oriented X 3. Moves all extremities spontaneously. No focal deficits noted. Psych:  Responds to questions appropriately with a normal affect. Skin: No rashes or lesions noted  Labs    Troponin (Point of Care Test)  Recent Labs  05/07/16 1230  TROPIPOC 0.00   No results for input(s): CKTOTAL, CKMB,  TROPONINI in the last 72 hours. Lab Results  Component Value Date   WBC 7.7 05/07/2016   HGB 11.9 (L) 05/07/2016   HCT 37.3 05/07/2016   MCV 91.2 05/07/2016   PLT 142 (L) 05/07/2016    Recent Labs Lab 05/07/16 1242  NA 140  K 4.5  CL 111  CO2 22  BUN 21*  CREATININE 1.14*  CALCIUM 8.8*  GLUCOSE 108*   No results found for: CHOL, HDL, LDLCALC, TRIG No results found for: DDIMER  No results found for: BNP No results found for: PROBNP No results for input(s): INR in the last 72 hours.    Radiology Studies    Dg Chest Portable 1 View  Result Date: 05/07/2016 CLINICAL DATA:  Syncope EXAM: PORTABLE CHEST 1 VIEW COMPARISON:  January 07, 2016 FINDINGS: There is no edema or consolidation. Heart is borderline prominent with pulmonary vascularity within normal limits. No adenopathy. There is atherosclerotic calcification in the aorta. There is left carotid artery calcification. Bones are osteoporotic. There is degenerative change in the right shoulder with superior migration of the humeral head, likely reflecting chronic rotator cuff tear. No pneumothorax. IMPRESSION: No edema or consolidation. Heart borderline prominent. Aortic atherosclerosis. Bones osteoporotic. Electronically Signed   By: Lowella Grip III M.D.   On: 05/07/2016 12:44    EKG & Cardiac Imaging    EKG: Paced rhythm (external pacer in place). - Personally Reviewed  ECHOCARDIOGRAM: None on File.   Assessment & Plan   1. Complete Heart Block - developed episode of vomiting and diarrhea earlier today which was followed by a syncopal episode. Initially, HR was in the 30's and thought to be most consistent with CHB or a junctional rhythm. - given Atropine followed by Glucagon with improvement in her symptoms and HR now in the 60's. - PTA medications are reviewed and include Digoxin 0.125mg  daily, Coreg 18.75mg  BID, and Cardizem 60mg  Q8H. Will hold all for now and follow HR closely on telemetry. Dig level pending. Has been started on Glucagon drip which can likely be stopped in the next 24 hours.  - no indication for temporary pacer at this time with improved HR and NSR. Keep pacer pads in place.  2. PAF - This patients CHA2DS2-VASc Score and unadjusted Ischemic Stroke Rate (% per year) is equal to 9.7 % stroke rate/year from a score of 6 (HTN, Female, Age (2), CVA(2)). Continue Eliquis 2.5mg  BID for anticoagulation.  - holding Cardizem, Lopressor, and Digoxin currently.   3. Systolic Murmur - most consistent with AS by examination.  - will check echocardiogram to assess LV and valve function.   4. HTN - hypotensive in the setting of CHB.  - hold PTA Hydralazine, Coreg, Digoxin, and Coreg.   5. Prior CVA - alert and oriented but reduced responsiveness in the setting of receiving Versed.  - continue to monitor mental status closely.   Signed, Erma Heritage, PA-C 05/07/2016, 1:46 PM Pager: [phone]  Agree with note by Guinevere Scarlet  Asked to see Ms Mcalpine by the ER physician for complete heart block and hypotension. I agree with the assessment and plan as outlined by Tanzania Streator PA-C. Ms. Millar is a 83 year old thin frail and fatigued Caucasian female recently relocated from [place] to  [place]. She has a history of hypertension, PAF on Eliquis and prior stroke. She is wheelchair-bound. She was found unresponsive at the skilled nursing facility and was transported. Upon Arrival she was noted to be hypotensive with incomplete heart block. She  also had gotten Versed and was minimally responsive but this improved over time. She was given IV push glucagon after she had received some atropine which was not effective and ultimately regained sinus rhythm with improvement in her blood pressure and her mental status. Her exam is remarkable for a systolic outflow tract murmur consistent with aortic stenosis and bilateral carotid bruits. Her laboratory exam is unremarkable. She does have mildly elevated lactic acid probably related to hypotension. She was initially treated with IV vancomycin and Zosyn for presumed sepsis but this was discontinued. Our plan is to initiate a glucagon infusion, discontinue her negative chronotropic drugs including digoxin, diltiazem and carvedilol. These will be reinitiated selectively do not think digoxin would be a good drug to restart. We will obtain a 2-D echocardiogram. She is a DO NOT RESUSCITATE and her family is discussing whether this continued during her hospitalization.   Lorretta Harp, M.D., [place], [facility], [place] [address] [place], [place]  [address]  [phone] 05/07/2016 2:46 PM

## 2016-05-07 NOTE — ED Notes (Signed)
Pt remains on the monitor appears more alert post Glucagon pt is now more alert and able to answer questions appropriately

## 2016-05-07 NOTE — ED Notes (Signed)
IVF wide open on pressure bags ER MD to bedside for evaluation will continue to treat pt for sepsis potential and family is to call back within the hour regarding further management of care

## 2016-05-07 NOTE — ED Notes (Signed)
Family spoke with ER MD, pt on monitor with pacer still capturing pt having one beat randomly pt remains on the monitor with low BP still remains unresponsive Lab at bedside

## 2016-05-07 NOTE — ED Notes (Signed)
Went in to evaluate pt, she is still awake and alert pacer rate decreased to 50 PPM pt tolerating well, pacer rate was decreased by physician pt remains on the monitor family at the bedside

## 2016-05-07 NOTE — ED Provider Notes (Addendum)
[facility] DEPT Provider Note   CSN: [id number] Arrival date & time: 05/07/16  1207     History   Chief Complaint Chief Complaint  Patient presents with  . Loss of Consciousness    EMS called out to [place] for syncope pt was found to be bradycardic Rate in the 30's     HPI Virginia Meyer is a 83 y.o. female.  Patient brought in by EMS. Patient from assisted living. Patient apparently had vomiting and diarrhea and collapsed the syncopal episode. EMS noted that heart rate was in the 30s. They gave her atropine. Heart rate came up and patient woke up. In route patient's heart rate started to get the slow again today started with external pacing. They gave her Versed 2.5 mg to prevent any pain from external pacing. Patient then became unresponsive seemed to be oversedated. Patient's blood pressure was hypotensive at the scene and in route.      Past Medical History:  Diagnosis Date  . Anxiety   . GERD (gastroesophageal reflux disease)   . Heart failure ([place])   . HLD (hyperlipidemia)   . HTN (hypertension)   . Hyperlipidemia   . Major depressive disorder   . Neuropathy ([place])   . PVD (peripheral vascular disease) ([place])   . Stroke [facility])    left sided deficit    Patient Active Problem List   Diagnosis Date Noted  . Benign essential HTN 08/21/2015  . Acute CVA (cerebrovascular accident) ([place]) 08/04/2015  . Left hemiparesis ([place]) 08/04/2015  . Primary osteoarthritis of left knee 08/04/2015  . Esophageal reflux 08/04/2015  . Hyperlipidemia LDL goal <70 08/04/2015  . Atrial fibrillation ([place]) 08/04/2015  . PVD (peripheral vascular disease) ([place]) 08/04/2015  . Protein-calorie malnutrition ([place]) 08/04/2015    Past Surgical History:  Procedure Laterality Date  . ABDOMINAL HYSTERECTOMY    . FOOT NEUROMA SURGERY    . SPINE SURGERY    . TONSILLECTOMY      OB History    No data available       Home Medications    Prior to Admission medications     Medication Sig Start Date End Date Taking? Authorizing Provider  acetaminophen (TYLENOL) 500 MG tablet Take 500 mg by mouth 4 (four) times daily. Left knee    Historical Provider, MD  apixaban (ELIQUIS) 2.5 MG TABS tablet Take 1 tablet (2.5 mg total) by mouth 2 (two) times daily. 10/16/15   Rosalin Hawking, MD  carvedilol (COREG) 12.5 MG tablet Take 14 mg by mouth 2 (two) times daily.     Historical Provider, MD  cephALEXin (KEFLEX) 500 MG capsule Take 1 capsule (500 mg total) by mouth 2 (two) times daily. 01/07/16   Recardo Evangelist, PA-C  digoxin (LANOXIN) 0.125 MG tablet Take 0.125 mg by mouth daily.    Historical Provider, MD  diltiazem (CARDIZEM) 60 MG tablet Take 60 mg by mouth every 8 (eight) hours.    Historical Provider, MD  docusate sodium (COLACE) 100 MG capsule Take 100 mg by mouth every 12 (twelve) hours as needed for mild constipation.    Historical Provider, MD  gabapentin (NEURONTIN) 300 MG capsule Take 300-600 mg by mouth See admin instructions. Pt takes 300mg  daily, takes 600mg  at bedtime 10/02/15   Historical Provider, MD  hydrALAZINE (APRESOLINE) 10 MG tablet Take 10 mg by mouth 3 (three) times daily.  09/10/15   Historical Provider, MD  loperamide (IMODIUM A-D) 2 MG tablet Take 2 mg by mouth every 8 (eight)  hours as needed for diarrhea or loose stools.    Historical Provider, MD  meclizine (ANTIVERT) 12.5 MG tablet Take 12.5 mg by mouth every 8 (eight) hours as needed for dizziness.    Historical Provider, MD  Menthol, Topical Analgesic, (BIOFREEZE) 4 % GEL Apply 1 application topically 4 (four) times daily. Left knee for pain     Historical Provider, MD  Olive Leaf Extract 500 MG CAPS Take 500 mg by mouth 2 (two) times daily.    Historical Provider, MD  sertraline (ZOLOFT) 25 MG tablet Take 25 mg by mouth daily.    Historical Provider, MD  traMADol (ULTRAM) 50 MG tablet Take 25 mg by mouth every 8 (eight) hours as needed for moderate pain.    Historical Provider, MD    Family  History Family History  Problem Relation Age of Onset  . Heart attack Brother   . Stroke Neg Hx   . Neuropathy Neg Hx     Social History Social History  Substance Use Topics  . Smoking status: Never Smoker  . Smokeless tobacco: Never Used  . Alcohol use No     Allergies   Patient has no known allergies.   Review of Systems Review of Systems  Unable to perform ROS: Mental status change     Physical Exam Updated Vital Signs BP (!) 138/42   Pulse (!) 58   Resp 16   Ht 5' (1.524 m)   Wt 50.8 kg   SpO2 99%   BMI 21.87 kg/m   Physical Exam  Constitutional: She appears well-developed and well-nourished. She appears distressed.  HENT:  Head: Normocephalic and atraumatic.  Mucous membranes slightly dry.  Eyes:  Pupils responsive  Neck: Neck supple.  Cardiovascular:  Bradycardia  Pulmonary/Chest: Effort normal and breath sounds normal.  Abdominal: Soft. She exhibits no distension.  Neurological:  Patient very somnolent and not arousable  Skin:  Skin cool to touch.     ED Treatments / Results  Labs (all labs ordered are listed, but only abnormal results are displayed) Labs Reviewed  BASIC METABOLIC PANEL - Abnormal; Notable for the following:       Result Value   Glucose, Bld 108 (*)    BUN 21 (*)    Creatinine, Ser 1.14 (*)    Calcium 8.8 (*)    GFR calc non Af Amer 43 (*)    GFR calc Af Amer 49 (*)    All other components within normal limits  CBC - Abnormal; Notable for the following:    Hemoglobin 11.9 (*)    Platelets 142 (*)    All other components within normal limits  I-STAT CHEM 8, ED - Abnormal; Notable for the following:    BUN 30 (*)    Creatinine, Ser 1.20 (*)    Glucose, Bld 111 (*)    Calcium, Ion 1.03 (*)    Hemoglobin 10.9 (*)    HCT 32.0 (*)    All other components within normal limits  I-STAT CG4 LACTIC ACID, ED - Abnormal; Notable for the following:    Lactic Acid, Venous 2.71 (*)    All other components within normal limits   CULTURE, BLOOD (ROUTINE X 2)  CULTURE, BLOOD (ROUTINE X 2)  URINALYSIS, ROUTINE W REFLEX MICROSCOPIC  DIGOXIN LEVEL  I-STAT TROPOININ, ED  I-STAT CG4 LACTIC ACID, ED    EKG  EKG Interpretation None       Radiology Dg Chest Portable 1 View  Result Date: 05/07/2016 CLINICAL DATA:  Syncope EXAM: PORTABLE CHEST 1 VIEW COMPARISON:  January 07, 2016 FINDINGS: There is no edema or consolidation. Heart is borderline prominent with pulmonary vascularity within normal limits. No adenopathy. There is atherosclerotic calcification in the aorta. There is left carotid artery calcification. Bones are osteoporotic. There is degenerative change in the right shoulder with superior migration of the humeral head, likely reflecting chronic rotator cuff tear. No pneumothorax. IMPRESSION: No edema or consolidation. Heart borderline prominent. Aortic atherosclerosis. Bones osteoporotic. Electronically Signed   By: Lowella Grip III M.D.   On: 05/07/2016 12:44    Procedures Procedures (including critical care time)  CRITICAL CARE Performed by: Fredia Sorrow Total critical care time: 65 minutes Critical care time was exclusive of separately billable procedures and treating other patients. Critical care was necessary to treat or prevent imminent or life-threatening deterioration. Critical care was time spent personally by me on the following activities: development of treatment plan with patient and/or surrogate as well as nursing, discussions with consultants, evaluation of patient's response to treatment, examination of patient, obtaining history from patient or surrogate, ordering and performing treatments and interventions, ordering and review of laboratory studies, ordering and review of radiographic studies, pulse oximetry and re-evaluation of patient's condition.   Medications Ordered in ED Medications  sodium chloride 0.9 % bolus 1,000 mL (0 mLs Intravenous Stopped 05/07/16 1358)    And    sodium chloride 0.9 % bolus 500 mL (0 mLs Intravenous Stopped 05/07/16 1344)    And  sodium chloride 0.9 % bolus 250 mL (250 mLs Intravenous Not Given 05/07/16 1339)  piperacillin-tazobactam (ZOSYN) IVPB 3.375 g (3.375 g Intravenous New Bag/Given 05/07/16 1329)  vancomycin (VANCOCIN) IVPB 1000 mg/200 mL premix (1,000 mg Intravenous New Bag/Given 05/07/16 1330)  vancomycin (VANCOCIN) 500 mg in sodium chloride 0.9 % 100 mL IVPB (not administered)  piperacillin-tazobactam (ZOSYN) IVPB 3.375 g (not administered)  glucagon (human recombinant) (GLUCAGEN) injection 1 mg (not administered)  glucagon (human recombinant) (GLUCAGEN) 5 mg in dextrose 5 % 50 mL (0.1 mg/mL) infusion (not administered)  glucagon (human recombinant) (GLUCAGEN) injection 3 mg (3 mg Intravenous Given 05/07/16 1317)  ondansetron (ZOFRAN) 4 MG/2ML injection (4 mg  Given 05/07/16 1326)     Initial Impression / Assessment and Plan / ED Course  I have reviewed the triage vital signs and the nursing notes.  Pertinent labs & imaging results that were available during my care of the patient were reviewed by me and considered in my medical decision making (see chart for details).    Patient came in with a very complicated situation. Patient apparently had vomiting and diarrhea at home and passed out. EMS when they got there she was in a significant bradycardia with a heart rate that they said was junctional around 30. They gave her atropine and heart rate improved. Patient also woke up. Patient had passed out. In route patient's heart rate gets low again so they started external pacing and gave her 2.5 mg of Versed. Patient arrived unresponsive but breathing on her own probably due to the medication. Patient's on paced heart rate was around 30. With pacing she was in the 60s. We continued external pacing.  Patient also had DO NOT RESUSCITATE paperwork. Patient looked like a candidate for more permanent pacemaker at least temporary pacemaker  but with a DO NOT RESUSCITATE status appropriately cardiology would not place it. Cardiology was contacted just to make them aware the patient. Also in the meantime I spoke with the patient's daughter and asked for some  guidance. Patient contacted son but by the time of the son called the patient was awake and had agreed to the pacemaker. It does sound like it was appropriate patient DO NOT RESUSCITATE stuff in place as fairly functional living in assisted living no terminal illness. No significant life limiting illnesses. Patient fairly functional. Patient agreed that she wanted the pacemaker. The meantime I had entertained the fact by looking through her meds that she was on a calcium channel blocker beta blocker and also on digoxin. Dig level was ordered and glucagon was ordered patient received 3 mg of glucagon IV with significant improvement. Patient started to have a heart rate in the 50s and blood pressure came up to the 30s. The patient first arrived blood pressure was 70 systolic patient was given IV fluids received total of 1.25 L with bringing the blood pressure up to the upper 80s but not really getting it into a decent of the pressure level. Code sepsis orders were originally ordered but as time went on his pre-clear probably was not sepsis. Probably was the primary bradycardia. Certainly after receiving the glucagon it was clear that the calcium channel blocker overdose was roughly playing some role in combination with the beta blocker and perhaps the dig.  Seen by cardiology. But at this point time she was improving they will admit her and monitor her. This does appear to be asymptomatic sinus bradycardia with hypotension secondary to medications.  Since patient responded well to the glucagon she was started on a glucagon drip of 3 mg/h. Patient remained stable.  Patient's chest x-ray was negative she has a history of CHF. But no signs of any fluid. Initial troponin was negative so no evidence  of a MI that occurred yesterday or during the night.    Final Clinical Impressions(s) / ED Diagnoses   Final diagnoses:  Bradycardia, drug induced  Hypotension due to drugs    New Prescriptions New Prescriptions   No medications on file     Fredia Sorrow, MD 05/07/16 1354    Fredia Sorrow, MD 05/07/16 1531

## 2016-05-07 NOTE — ED Notes (Signed)
Pt brought in via EMS ER MD to room pt taken off EMS monitor and placed on ED monitor pacing at a rate of 80 pt remains unresponsive due to Versed, 2L [place] placed on pt

## 2016-05-08 ENCOUNTER — Inpatient Hospital Stay (HOSPITAL_COMMUNITY): Payer: Medicare HMO

## 2016-05-08 DIAGNOSIS — I359 Nonrheumatic aortic valve disorder, unspecified: Secondary | ICD-10-CM

## 2016-05-08 LAB — BASIC METABOLIC PANEL
Anion gap: 7 (ref 5–15)
BUN: 25 mg/dL — ABNORMAL HIGH (ref 6–20)
CHLORIDE: 108 mmol/L (ref 101–111)
CO2: 23 mmol/L (ref 22–32)
Calcium: 8.4 mg/dL — ABNORMAL LOW (ref 8.9–10.3)
Creatinine, Ser: 0.88 mg/dL (ref 0.44–1.00)
GFR calc Af Amer: >60 mL/min (ref >60)
GFR calc non Af Amer: 58 mL/min — ABNORMAL LOW (ref >60)
Glucose, Bld: 135 mg/dL — ABNORMAL HIGH (ref 65–99)
POTASSIUM: 3.5 mmol/L (ref 3.5–5.1)
Sodium: 138 mmol/L (ref 135–145)

## 2016-05-08 LAB — CBC
HCT: 34.2 % — ABNORMAL LOW (ref 36.0–46.0)
HEMOGLOBIN: 11.2 g/dL — AB (ref 12.0–15.0)
MCH: 29.5 pg (ref 26.0–34.0)
MCHC: 32.7 g/dL (ref 30.0–36.0)
MCV: 90 fL (ref 78.0–100.0)
Platelets: 145 10*3/uL — ABNORMAL LOW (ref 150–400)
RBC: 3.8 MIL/uL — AB (ref 3.87–5.11)
RDW: 14.7 % (ref 11.5–15.5)
WBC: 12.1 10*3/uL — ABNORMAL HIGH (ref 4.0–10.5)

## 2016-05-08 LAB — ECHOCARDIOGRAM COMPLETE
Height: 60 in
WEIGHTICAEL: 1792 [oz_av]

## 2016-05-08 MED ORDER — DILTIAZEM HCL-DEXTROSE 100-5 MG/100ML-% IV SOLN (PREMIX): 5.0000 mg/h | INTRAVENOUS | Status: DC

## 2016-05-08 MED ORDER — AMIODARONE HCL IN DEXTROSE 360-4.14 MG/200ML-% IV SOLN
60.0000 mg/h | INTRAVENOUS | Status: AC
  Administered 2016-05-08 (×2): 60 mg/h via INTRAVENOUS
  Filled 2016-05-08 (×2): qty 200

## 2016-05-08 MED ORDER — AMIODARONE HCL IN DEXTROSE 360-4.14 MG/200ML-% IV SOLN
30.0000 mg/h | INTRAVENOUS | Status: DC
  Administered 2016-05-09 (×2): 30 mg/h via INTRAVENOUS
  Filled 2016-05-08: qty 200

## 2016-05-08 MED ORDER — DILTIAZEM LOAD VIA INFUSION
5.0000 mg | Freq: Once | INTRAVENOUS | Status: DC
Start: 2016-05-08 — End: 2016-05-08
  Filled 2016-05-08: qty 5

## 2016-05-08 MED ORDER — AMIODARONE LOAD VIA INFUSION
150.0000 mg | Freq: Once | INTRAVENOUS | Status: AC
  Administered 2016-05-08: 150 mg via INTRAVENOUS
  Filled 2016-05-08: qty 83.34

## 2016-05-08 MED ORDER — PERFLUTREN LIPID MICROSPHERE
1.0000 mL | INTRAVENOUS | Status: AC | PRN
  Administered 2016-05-08: 2 mL via INTRAVENOUS
  Filled 2016-05-08: qty 10

## 2016-05-08 MED ORDER — GLUCAGON HCL RDNA (DIAGNOSTIC) 1 MG IJ SOLR
3.0000 mg/h | INTRAVENOUS | Status: DC
  Administered 2016-05-08: 3 mg/h via INTRAVENOUS
  Filled 2016-05-08 (×2): qty 5

## 2016-05-08 NOTE — Progress Notes (Signed)
MD gave order to d/c external pacer. -done-. Echo tech.at bedside to do echo.

## 2016-05-08 NOTE — Plan of Care (Addendum)
Called by bedside RN to notify me that pt had been in sinus tach for the last hour or so and I requested an ECG to confirm.  ECG per my read appears to reveal flutter waves with 2:1 AV conduction and first F-wave at the end of each QRS complex and not visible in all leads.  Additionally, there is now intraventricular conduction delay that was not present prior.  The pt remains asymptomatic and is otherwise stable with no change in other vital signs.  In an effort to decrease AV nodal blockade as much as possible given presenting CHB, will attempt to chemically cardiovert with Amio bolus then gtt understanding that there is some risk for recurrent heart block given the beta-blocker properties of amio.  Will continue to assess.  Clayborne Dana MD     ADDENDUM (7:30pm)  The pt converted from atrial flutter back to NSR just after I completed this note and prior to starting Amio.  We will hold the amio for now and continue to monitor.  Clayborne Dana MD

## 2016-05-08 NOTE — Progress Notes (Signed)
Pt HR went up to 140's, BP:169/77, asymptomatic. Cardio fellow made aware. Ordered to do EKG/Done. Awaiting return call. Will continue to monitor pt.

## 2016-05-08 NOTE — Progress Notes (Signed)
  Echocardiogram 2D Echocardiogram with Definity has been performed.  Tresa Res 05/08/2016, 2:26 PM

## 2016-05-08 NOTE — Progress Notes (Signed)
Patient ID: Virginia Meyer, female   DOB: 20-Dec-1928, 83 y.o.   MRN: [id number]    SUBJECTIVE: Patient is nauseated this morning.  BP stable, HR in 90s appears to be NSR.   Scheduled Meds: . apixaban  2.5 mg Oral BID  . gabapentin  600 mg Oral QHS   And  . gabapentin  300 mg Oral Daily  . sertraline  25 mg Oral Daily   Continuous Infusions: PRN Meds:.docusate sodium, hydrALAZINE, ondansetron (ZOFRAN) IV, traMADol    Vitals:   05/08/16 0200 05/08/16 0230 05/08/16 0300 05/08/16 0754  BP:  (!) 156/45 (!) 154/67 (!) 97/49  Pulse: 73 74 76   Resp: 20 (!) 21 16 16   Temp:   99.3 F (37.4 C) 97.7 F (36.5 C)  TempSrc:   Oral Oral  SpO2: 100% 99% 99%   Weight:      Height:        Intake/Output Summary (Last 24 hours) at 05/08/16 1005 Last data filed at 05/07/16 1359  Gross per 24 hour  Intake             1550 ml  Output                0 ml  Net             1550 ml    LABS: Basic Metabolic Panel:  Recent Labs  05/07/16 1242 05/08/16 0325  NA 140 138  K 4.5 3.5  CL 111 108  CO2 22 23  GLUCOSE 108* 135*  BUN 21* 25*  CREATININE 1.14* 0.88  CALCIUM 8.8* 8.4*   Liver Function Tests: No results for input(s): AST, ALT, ALKPHOS, BILITOT, PROT, ALBUMIN in the last 72 hours. No results for input(s): LIPASE, AMYLASE in the last 72 hours. CBC:  Recent Labs  05/07/16 1242 05/08/16 0325  WBC 7.7 12.1*  HGB 11.9* 11.2*  HCT 37.3 34.2*  MCV 91.2 90.0  PLT 142* 145*   Cardiac Enzymes: No results for input(s): CKTOTAL, CKMB, CKMBINDEX, TROPONINI in the last 72 hours. BNP: Invalid input(s): POCBNP D-Dimer: No results for input(s): DDIMER in the last 72 hours. Hemoglobin A1C: No results for input(s): HGBA1C in the last 72 hours. Fasting Lipid Panel: No results for input(s): CHOL, HDL, LDLCALC, TRIG, CHOLHDL, LDLDIRECT in the last 72 hours. Thyroid Function Tests: No results for input(s): TSH, T4TOTAL, T3FREE, THYROIDAB in the last 72 hours.  Invalid input(s):  FREET3 Anemia Panel: No results for input(s): VITAMINB12, FOLATE, FERRITIN, TIBC, IRON, RETICCTPCT in the last 72 hours.  RADIOLOGY: Dg Chest Portable 1 View  Result Date: 05/07/2016 CLINICAL DATA:  Syncope EXAM: PORTABLE CHEST 1 VIEW COMPARISON:  January 07, 2016 FINDINGS: There is no edema or consolidation. Heart is borderline prominent with pulmonary vascularity within normal limits. No adenopathy. There is atherosclerotic calcification in the aorta. There is left carotid artery calcification. Bones are osteoporotic. There is degenerative change in the right shoulder with superior migration of the humeral head, likely reflecting chronic rotator cuff tear. No pneumothorax. IMPRESSION: No edema or consolidation. Heart borderline prominent. Aortic atherosclerosis. Bones osteoporotic. Electronically Signed   By: Lowella Grip III M.D.   On: 05/07/2016 12:44    PHYSICAL EXAM General: NAD Neck: No JVD, no thyromegaly or thyroid nodule.  Lungs: Clear to auscultation bilaterally with normal respiratory effort. CV: Nondisplaced PMI.  Heart regular S1/S2, no S3/S4, 2/6 SEM RUSB.  No peripheral edema.   Abdomen: Soft, nontender, no hepatosplenomegaly, no distention.  Neurologic: Alert and  oriented x 3.  Psych: Normal affect. Extremities: No clubbing or cyanosis.   TELEMETRY: Personally reviewed telemetry pt in NSR in 90s  ASSESSMENT AND PLAN: 83 yo with history of paroxysmal atrial fibrillation and prior CVA was admitted from her SNF with complete heart block, HR in 30.  1. CHB: HR around 30 initially, she was externally paced.  She got atropine and was started on glucagon gtt.  Digoxin, Coreg, and diltiazem were all held.  Currently, NSR in 90s.  She certainly has underlying conduction disease, but she may be able to avoid PPM with discontinuation of her 3 nodal blocking agents.  - Will leave off digoxin, Coreg, and diltiazem for now.  Follow BP and heart rate.  May need something for BP  control.  - Can stop glucagon gtt.  2. Atrial fibrillation: She appears to be in NSR.  Needs ECG today, will order.  Continue apixaban.  3. Deconditioning: Patient is wheelchair-bound at baseline.   Watch in step down today, to telemetry tomorrow.    Loralie Champagne 05/08/2016 10:09 AM

## 2016-05-09 DIAGNOSIS — I4892 Unspecified atrial flutter: Secondary | ICD-10-CM

## 2016-05-09 LAB — BASIC METABOLIC PANEL
Anion gap: 9 (ref 5–15)
BUN: 17 mg/dL (ref 6–20)
CALCIUM: 8.9 mg/dL (ref 8.9–10.3)
CO2: 21 mmol/L — AB (ref 22–32)
CREATININE: 0.84 mg/dL (ref 0.44–1.00)
Chloride: 108 mmol/L (ref 101–111)
GFR calc Af Amer: >60 mL/min (ref >60)
GLUCOSE: 74 mg/dL (ref 65–99)
Potassium: 3.9 mmol/L (ref 3.5–5.1)
Sodium: 138 mmol/L (ref 135–145)

## 2016-05-09 MED ORDER — AMIODARONE HCL 200 MG PO TABS
200.0000 mg | ORAL_TABLET | Freq: Two times a day (BID) | ORAL | Status: DC
  Administered 2016-05-09 – 2016-05-10 (×3): 200 mg via ORAL
  Filled 2016-05-09 (×3): qty 1

## 2016-05-09 MED ORDER — LISINOPRIL 10 MG PO TABS
10.0000 mg | ORAL_TABLET | Freq: Every day | ORAL | Status: DC
  Administered 2016-05-09: 10 mg via ORAL
  Filled 2016-05-09: qty 1

## 2016-05-09 MED ORDER — HYDRALAZINE HCL 20 MG/ML IJ SOLN
10.0000 mg | Freq: Once | INTRAMUSCULAR | Status: AC
  Administered 2016-05-10: 10 mg via INTRAVENOUS
  Filled 2016-05-09: qty 1

## 2016-05-09 NOTE — Progress Notes (Signed)
Patient ID: Virginia Meyer, female   DOB: Aug 20, 1928, 82 y.o.   MRN: [id number]    SUBJECTIVE:  NSR this morning.  Last night, had atrial flutter with RVR.  She was started on amiodarone gtt and now back in NSR.  No recurrent bradycardia/heart block.  BP elevated.  No dyspnea or chest pain.   Echo: EF 65-70%, mid-cavity LV gradient to peak 51 mmHg, normal RV, mild aortic stenosis.   Scheduled Meds: . amiodarone  200 mg Oral BID  . apixaban  2.5 mg Oral BID  . gabapentin  600 mg Oral QHS   And  . gabapentin  300 mg Oral Daily  . lisinopril  10 mg Oral Daily  . sertraline  25 mg Oral Daily   Continuous Infusions: PRN Meds:.docusate sodium, hydrALAZINE, ondansetron (ZOFRAN) IV, traMADol    Vitals:   05/09/16 0600 05/09/16 0700 05/09/16 0800 05/09/16 0900  BP:  (!) 163/59 (!) 177/68 (!) 184/55  Pulse: 72 70 71 71  Resp: 14 14 18 15   Temp:   98.5 F (36.9 C)   TempSrc:   Oral   SpO2: 95% 94% 94% 93%  Weight:      Height:        Intake/Output Summary (Last 24 hours) at 05/09/16 0944 Last data filed at 05/09/16 0900  Gross per 24 hour  Intake            792.6 ml  Output                0 ml  Net            792.6 ml    LABS: Basic Metabolic Panel:  Recent Labs  05/08/16 0325 05/09/16 0350  NA 138 138  K 3.5 3.9  CL 108 108  CO2 23 21*  GLUCOSE 135* 74  BUN 25* 17  CREATININE 0.88 0.84  CALCIUM 8.4* 8.9   Liver Function Tests: No results for input(s): AST, ALT, ALKPHOS, BILITOT, PROT, ALBUMIN in the last 72 hours. No results for input(s): LIPASE, AMYLASE in the last 72 hours. CBC:  Recent Labs  05/07/16 1242 05/08/16 0325  WBC 7.7 12.1*  HGB 11.9* 11.2*  HCT 37.3 34.2*  MCV 91.2 90.0  PLT 142* 145*   Cardiac Enzymes: No results for input(s): CKTOTAL, CKMB, CKMBINDEX, TROPONINI in the last 72 hours. BNP: Invalid input(s): POCBNP D-Dimer: No results for input(s): DDIMER in the last 72 hours. Hemoglobin A1C: No results for input(s): HGBA1C in the last  72 hours. Fasting Lipid Panel: No results for input(s): CHOL, HDL, LDLCALC, TRIG, CHOLHDL, LDLDIRECT in the last 72 hours. Thyroid Function Tests: No results for input(s): TSH, T4TOTAL, T3FREE, THYROIDAB in the last 72 hours.  Invalid input(s): FREET3 Anemia Panel: No results for input(s): VITAMINB12, FOLATE, FERRITIN, TIBC, IRON, RETICCTPCT in the last 72 hours.  RADIOLOGY: Dg Chest Portable 1 View  Result Date: 05/07/2016 CLINICAL DATA:  Syncope EXAM: PORTABLE CHEST 1 VIEW COMPARISON:  January 07, 2016 FINDINGS: There is no edema or consolidation. Heart is borderline prominent with pulmonary vascularity within normal limits. No adenopathy. There is atherosclerotic calcification in the aorta. There is left carotid artery calcification. Bones are osteoporotic. There is degenerative change in the right shoulder with superior migration of the humeral head, likely reflecting chronic rotator cuff tear. No pneumothorax. IMPRESSION: No edema or consolidation. Heart borderline prominent. Aortic atherosclerosis. Bones osteoporotic. Electronically Signed   By: Lowella Grip III M.D.   On: 05/07/2016 12:44    PHYSICAL  EXAM General: NAD in bed.  Neck: No JVD, no thyromegaly or thyroid nodule.  Lungs: Clear to auscultation bilaterally with normal respiratory effort. CV: Nondisplaced PMI.  Heart regular S1/S2, no S3/S4, 3/6 SEM RUSB with clear S2.  No peripheral edema.   Abdomen: Soft, nontender, no hepatosplenomegaly, no distention.  Neurologic: Alert and oriented x 3.  Psych: Normal affect. Extremities: No clubbing or cyanosis.   TELEMETRY: Personally reviewed telemetry pt in NSR in 80s  ASSESSMENT AND PLAN: 83 yo with history of paroxysmal atrial fibrillation and prior CVA was admitted from her SNF with complete heart block, HR in 30.  1. CHB: HR around 30 initially, she was externally paced.  She got atropine and was started on glucagon gtt.  Digoxin, Coreg, and diltiazem were all held.   Currently, NSR in 80s.  She certainly has underlying conduction disease, but she may be able to avoid PPM with discontinuation of her 3 home nodal blocking agents.  Last night, she had atrial flutter with RVR and amiodarone gtt was begun, now back in NSR.  - Will leave off digoxin, Coreg, and diltiazem for now.  Will try her on low dose amiodarone to maintain NSR and avoid other nodal blocking agents.  2. Atrial fibrillation/flutter: NSR currently.  Had atrial flutter with RVR last night, IV amiodarone begun.  - Stop IV amiodarone, will put her on amiodarone 200 mg bid x 10 days then 200 mg daily.  Hopefully, this will keep her out of atrial fib/flutter and avoid the need for other nodal blocking agents for rate control.  - Continue apixaban.  3. Deconditioning: Patient is wheelchair-bound at baseline.  4. HTN: SBP as high as 190s off her home meds.  Will add lisinopril 10 mg daily (normal renal function).  5. Disposition: Back to SNF tomorrow if stable rhythm.   Loralie Champagne 05/09/2016 9:44 AM

## 2016-05-10 LAB — BASIC METABOLIC PANEL
ANION GAP: 8 (ref 5–15)
BUN: 10 mg/dL (ref 6–20)
CALCIUM: 9.7 mg/dL (ref 8.9–10.3)
CO2: 27 mmol/L (ref 22–32)
Chloride: 105 mmol/L (ref 101–111)
Creatinine, Ser: 0.77 mg/dL (ref 0.44–1.00)
GLUCOSE: 100 mg/dL — AB (ref 65–99)
Potassium: 3.8 mmol/L (ref 3.5–5.1)
Sodium: 140 mmol/L (ref 135–145)

## 2016-05-10 LAB — CBC
HCT: 38.6 % (ref 36.0–46.0)
Hemoglobin: 12.5 g/dL (ref 12.0–15.0)
MCH: 29.1 pg (ref 26.0–34.0)
MCHC: 32.4 g/dL (ref 30.0–36.0)
MCV: 89.8 fL (ref 78.0–100.0)
Platelets: 172 K/uL (ref 150–400)
RBC: 4.3 MIL/uL (ref 3.87–5.11)
RDW: 15.1 % (ref 11.5–15.5)
WBC: 7.2 K/uL (ref 4.0–10.5)

## 2016-05-10 MED ORDER — LISINOPRIL 20 MG PO TABS: 20.0000 mg | ORAL_TABLET | Freq: Every day | ORAL | 5 refills | Status: DC

## 2016-05-10 MED ORDER — AMIODARONE HCL 200 MG PO TABS: ORAL_TABLET | ORAL | 5 refills | Status: DC

## 2016-05-10 MED ORDER — LISINOPRIL 20 MG PO TABS
20.0000 mg | ORAL_TABLET | Freq: Every day | ORAL | Status: DC
  Administered 2016-05-10: 20 mg via ORAL
  Filled 2016-05-10: qty 1

## 2016-05-10 NOTE — Clinical Social Work Note (Signed)
Clinical Social Work Assessment  Patient Details  Name: Virginia Meyer MRN: [id number] Date of Birth: [birth year]  Date of referral:  05/10/16               Reason for consult:  Discharge Planning                Permission sought to share information with:  Facility Sport and exercise psychologist, Family Supports Permission granted to share information::  Yes, Verbal Permission Granted  Name::     Virginia Meyer  Agency::  [employer]  Relationship::  Daughter  Contact Information:  [phone]  Housing/Transportation Living arrangements for the past 2 months:  [place] of Information:  Patient, Adult Children Patient Interpreter Needed:  None Criminal Activity/Legal Involvement Pertinent to Current Situation/Hospitalization:  No - Comment as needed Significant Relationships:  Adult Children Lives with:  Facility Resident Do you feel safe going back to the place where you live?  Yes Need for family participation in patient care:  Yes (Comment)  Care giving concerns:  CSW received consult regarding discharge planning. Patient is from [place] and would like to return at discharge. CSW to continue to follow and assist with discharge planning needs.   Social Worker assessment / plan:  CSW spoke with patient/patient's daughter concerning return to ALF.  Employment status:  Retired Nurse, adult PT Recommendations:  Not assessed at this time Information / Referral to community resources:   (N/A)  Patient/Family's Response to care:  Patient reports agreement with plan to return to ALF. Patient's daughter would like to drive her there at discharge.   Patient/Family's Understanding of and Emotional Response to Diagnosis, Current Treatment, and Prognosis:  Patient expressed feeling better and is happy about discharging home today. Patient expressed understanding of CSW role and discharge process. No questions/concerns about plan or  treatment.    Emotional Assessment Appearance:  Appears stated age Attitude/Demeanor/Rapport:  Other (Appropriate) Affect (typically observed):  Accepting, Appropriate Orientation:  Oriented to Self, Oriented to Situation, Oriented to Place, Oriented to  Time Alcohol / Substance use:  Not Applicable Psych involvement (Current and /or in the community):  No (Comment)  Discharge Needs  Concerns to be addressed:  Care Coordination Readmission within the last 30 days:  No Current discharge risk:  None Barriers to Discharge:  No Barriers Identified   Benard Halsted, [place] 05/10/2016, 11:29 AM

## 2016-05-10 NOTE — Progress Notes (Signed)
Patient will DC to: [facility] Anticipated DC date: 05/10/16 Family notified: Daughter, Multimedia programmer by: [name] by car   Per MD patient ready for DC to ALF. RN, patient, patient's family, and facility notified of DC. Discharge Summary sent to facility. RN given number for report [phone])   CSW signing off.  Cedric Fishman, [place] Social Worker [phone]

## 2016-05-10 NOTE — Progress Notes (Addendum)
Progress Note  Patient Name: Virginia Meyer Date of Encounter: 05/10/2016  Primary Cardiologist: Gwenlyn Found  Subjective   No chest pain or dyspnea.   Inpatient Medications    Scheduled Meds: . amiodarone  200 mg Oral BID  . apixaban  2.5 mg Oral BID  . gabapentin  600 mg Oral QHS   And  . gabapentin  300 mg Oral Daily  . lisinopril  10 mg Oral Daily  . sertraline  25 mg Oral Daily   Continuous Infusions:  PRN Meds: docusate sodium, hydrALAZINE, ondansetron (ZOFRAN) IV, traMADol   Vital Signs    Vitals:   05/09/16 2200 05/09/16 2300 05/09/16 2341 05/10/16 0408  BP: (!) 156/50 (!) 181/105 (!) 181/105 (!) 143/83  Pulse: 77 90 82 87  Resp: (!) 22 (!) 21 20 14   Temp:  99 F (37.2 C)  99.3 F (37.4 C)  TempSrc:  Oral  Oral  SpO2: 91% 93% 93% 90%  Weight:      Height:        Intake/Output Summary (Last 24 hours) at 05/10/16 0734 Last data filed at 05/09/16 1600  Gross per 24 hour  Intake           794.59 ml  Output                0 ml  Net           794.59 ml   Filed Weights   05/07/16 1210 05/09/16 0500  Weight: 112 lb (50.8 kg) 121 lb 14.4 oz (55.3 kg)    Telemetry    Sinus with PACs - Personally Reviewed  ECG    N/A - Personally Reviewed  Physical Exam   GEN: No acute distress.   Neck: No JVD Cardiac: Regular with ectopy, soft systolic murmur. No rubs, or gallops.  Respiratory: Clear to auscultation bilaterally. GI: Soft, nontender, non-distended  MS: No edema; No deformity. Neuro:  Nonfocal  Psych: Normal affect   Labs    Chemistry Recent Labs Lab 05/08/16 0325 05/09/16 0350 05/10/16 0402  NA 138 138 140  K 3.5 3.9 3.8  CL 108 108 105  CO2 23 21* 27  GLUCOSE 135* 74 100*  BUN 25* 17 10  CREATININE 0.88 0.84 0.77  CALCIUM 8.4* 8.9 9.7  GFRNONAA 58* >60 >60  GFRAA >60 >60 >60  ANIONGAP 7 9 8      Hematology Recent Labs Lab 05/07/16 1242 05/08/16 0325 05/10/16 0402  WBC 7.7 12.1* 7.2  RBC 4.09 3.80* 4.30  HGB 11.9* 11.2*  12.5  HCT 37.3 34.2* 38.6  MCV 91.2 90.0 89.8  MCH 29.1 29.5 29.1  MCHC 31.9 32.7 32.4  RDW 14.8 14.7 15.1  PLT 142* 145* 172    Radiology    No results found.  Cardiac Studies   Echo 05/08/16: Left ventricle: The cavity size was normal. There was mild focal   basal hypertrophy of the septum. Systolic function was vigorous.   The estimated ejection fraction was in the range of 65% to 70%.   There is a LV mid-cavity gradient, peak 51 mmHg. Wall motion was   normal; there were no regional wall motion abnormalities. Doppler   parameters are consistent with abnormal left ventricular   relaxation (grade 1 diastolic dysfunction). - Aortic valve: Trileaflet; moderately calcified leaflets. There   was mild stenosis. There was trivial regurgitation. Mean gradient   (S): 13 mm Hg. - Mitral valve: Moderately calcified annulus. There was no   significant regurgitation. -  Left atrium: The atrium was moderately to severely dilated. - Right ventricle: The cavity size was normal. Systolic function   was normal. - Tricuspid valve: Peak RV-RA gradient (S): 28 mm Hg. - Pulmonary arteries: PA peak pressure: 31 mm Hg (S). - Inferior vena cava: The vessel was normal in size. The   respirophasic diameter changes were in the normal range (>= 50%),   consistent with normal central venous pressure.  Impressions:  - Normal LV size with mild focal basal septal hypertrophy. EF   65-70%. LV mid-cavity gradient to 51 mmHg peak. Normal RV size   and systolic function. Moderate to severe left atrial   enlargement. Mild aortic stenosis.   Patient Profile     83 y.o. female with PAF, CVA admitted with complete heart block.   Assessment & Plan    1. Complete heart block: She was admitted with complete heart block. Digoxin, Diltiazem and Coreg held on admission. After holding her medications, her heart rate improved. She is known to have atrial fibrillation and is on Eliquis at home. She had a run of  atrial flutter on 05/08/16 and was started on amiodarone. Echo with normal LV systolic function. -Will not restart Coreg, Digoxin or Cardizem -Will continue po amiodarone  2. Atrial fibrillation/flutter: As above, will continue po amiodarone and Eliquis. She is in sinus this am with PACs. Will load with amiodarone 200 mg po BID for total of 10 days then change to amiodarone 200 mg daily.   3. HTN: Will continue Lisinopril for BP control. (this was started 05/09/17). Will increase Lisinopril to 20 mg daily. She will need a BMET in one week at f/u.   Dispo: d/c to [facility] today. She will need f/u in one week with office APP then long term f/u with Dr. Gwenlyn Found  Signed, Lauree Chandler, MD  05/10/2016, 7:34 AM

## 2016-05-10 NOTE — NC FL2 (Addendum)
[place] LEVEL OF CARE SCREENING TOOL     IDENTIFICATION  Patient Name: Virginia Meyer Birthdate: [birth year] Sex: female Admission Date (Current Location): 05/07/2016  [facility] and [place] Number:  Herbalist and Address:  [facility] [place] [address] [place], [place] [address]      Provider Number: [id number]  Attending Physician Name and Address:  Lorretta Harp, MD  Relative Name and Phone Number:  [name], daughter, [phone]    Current Level of Care: Hospital Recommended Level of Care: [place] Prior Approval Number:    Date Approved/Denied:   PASRR Number:    Discharge Plan: Other (Comment) (ALF)    Current Diagnoses: Patient Active Problem List   Diagnosis Date Noted  . Complete heart block ([place]) 05/07/2016  . Benign essential HTN 08/21/2015  . Acute CVA (cerebrovascular accident) ([place]) 08/04/2015  . Left hemiparesis ([place]) 08/04/2015  . Primary osteoarthritis of left knee 08/04/2015  . Esophageal reflux 08/04/2015  . Hyperlipidemia LDL goal <70 08/04/2015  . Paroxysmal atrial fibrillation ([place]) 08/04/2015  . PVD (peripheral vascular disease) ([place]) 08/04/2015  . Protein-calorie malnutrition ([place]) 08/04/2015    Orientation RESPIRATION BLADDER Height & Weight     Self, Time, Situation, Place  O2 (Nasal cannula 2L) Incontinent Weight: 55.3 kg (121 lb 14.4 oz) Height:  5' (152.4 cm)  BEHAVIORAL SYMPTOMS/MOOD NEUROLOGICAL BOWEL NUTRITION STATUS      Incontinent  (No Added Salt)  AMBULATORY STATUS COMMUNICATION OF NEEDS Skin   Extensive Assist Verbally Normal                       Personal Care Assistance Level of Assistance  Bathing, Feeding, Dressing Bathing Assistance: Maximum assistance Feeding assistance: Limited assistance Dressing Assistance: Limited assistance     Functional Limitations Info  Hearing   Hearing Info: Impaired      SPECIAL CARE FACTORS FREQUENCY                       Contractures      Additional Factors Info  Code Status, Allergies, Psychotropic Code Status Info: Full Allergies Info: NKA Psychotropic Info: Zoloft         Current Medications (05/10/2016):    Discharge Medications: STOP taking these medications   carvedilol 12.5 MG tablet Commonly known as:  COREG   digoxin 0.125 MG tablet Commonly known as:  LANOXIN   diltiazem 60 MG tablet Commonly known as:  CARDIZEM   hydrALAZINE 10 MG tablet Commonly known as:  APRESOLINE     TAKE these medications   acetaminophen 500 MG tablet Commonly known as:  TYLENOL Take 500 mg by mouth 4 (four) times daily. Left knee   amiodarone 200 MG tablet Commonly known as:  PACERONE Take 1 tablet by mouth twice daily for 10 days. Starting on 05/20/2016, only take 1 tablet once daily.   apixaban 2.5 MG Tabs tablet Commonly known as:  ELIQUIS Take 1 tablet (2.5 mg total) by mouth 2 (two) times daily.   BIOFREEZE 4 % Gel Generic drug:  Menthol (Topical Analgesic) Apply 1 application topically 4 (four) times daily. Left knee for pain   CALMOSEPTINE 0.44-20.6 % Oint Generic drug:  Menthol-Zinc Oxide Apply 1 application topically at bedtime as needed (for irritation).   diphenhydrAMINE 2 % cream Commonly known as:  BENADRYL Apply 1 application topically at bedtime.   gabapentin 300 MG capsule Commonly known as:  NEURONTIN Take 300-600  mg by mouth See admin instructions. Pt takes 300mg  daily, takes 600mg  at bedtime   lisinopril 20 MG tablet Commonly known as:  PRINIVIL,ZESTRIL Take 1 tablet (20 mg total) by mouth daily.   loperamide 2 MG tablet Commonly known as:  IMODIUM A-D Take 2 mg by mouth every 8 (eight) hours as needed for diarrhea or loose stools.   meclizine 12.5 MG tablet Commonly known as:  ANTIVERT Take 12.5 mg by mouth every 8 (eight) hours as needed for dizziness.   sertraline 25 MG tablet Commonly known as:  ZOLOFT Take 25 mg by mouth  daily.   traMADol 50 MG tablet Commonly known as:  ULTRAM Take 25 mg by mouth every 8 (eight) hours as needed for moderate pain.     Relevant Imaging Results:  Relevant Lab Results:   Additional Information SSN: [place], [place]

## 2016-05-10 NOTE — Discharge Summary (Signed)
Discharge Summary    Patient ID: Virginia Meyer,  MRN: [id number], DOB/AGE: Aug 10, 1928 83 y.o.  Admit date: 05/07/2016 Discharge date: 05/10/2016  Primary Care Provider: Reymundo Poll Primary Cardiologist: Dr. Gwenlyn Found   Discharge Diagnoses    Principal Problem:   Complete heart block [facility]) Active Problems:   Paroxysmal atrial fibrillation ([place])   Benign essential HTN   History of Present Illness     Virginia Meyer is a 83 y.o. female with past medical history of PAF (on Eliquis), HTN, HLD, and prior CVA who presented to [facility] ED on 05/07/2016 in complete heart block.   She resides at [facility] and was noted to be experiencing diarrhea and vomiting on the day of her presentation, later having a syncopal episode. EMS was called and upon their arrival, her HR was in the 30's and thought to be most consistent with CHB or a junctional rhythm. Was given Atropine then externally paced. Was given Versed with this and was initially unresponsive upon arrival to the ED. She became more alert following administration of Glucagon.  PTA medications included Eliquis 2.5mg  BID, Digoxin 0.125mg  daily, Coreg 18.75mg  BID, and Cardizem 60mg  Q8H. Labs showed K+ 4.7, creatinine 1.03, and initial troponin negative. CXR with no edema or consolidation. EKG with paced rhythm (external pacer in place).   Her PTA Digoxin, Coreg, and Cardizem were held and she was admitted for further observation.    Hospital Course     Consultants: None  The following morning, she reported nausea but denied any other symptoms. HR was improved into the 90's and external pacer was removed. Glucagon drip was discontinued. An echocardiogram was performed and showed a normal EF of 65-70%, moderate to severe left atrial enlargement and mild aortic stenosis.  During the evening hours, she went into atrial flutter with a HR in the 140's. Due to not wanting to restart AV nodal blocking agents, she was started on IV  Amiodarone with return to NSR. This was later switched to PO Amiodarone 200mg  BID. Lisinopril 10mg  daily was added to assist with BP control, as SBP was peaking into the 190's.   On 05/10/2016, she reported feeling well and denied any recurrent symptoms. She was maintaining NSR on telemetry with occasional PAC's.   She was last examined by Dr. Angelena Form and deemed stable for discharge. She will continue on Eliquis and Amiodarone 200mg  BID (for a total of 10 days then 200mg  daily). Coreg, Digoxin, and Cardizem were discontinued. Lisinopril was increased from 10mg  daily to 20mg  daily to further assist with BP control. She was discharged home back to [facility] in good condition. Cardiology follow-up has been arranged.   _____________  Discharge Vitals Blood pressure (!) 117/50, pulse (!) 105, temperature 99.8 F (37.7 C), temperature source Oral, resp. rate (!) 21, height 5' (1.524 m), weight 121 lb 14.4 oz (55.3 kg), SpO2 91 %.  Filed Weights   05/07/16 1210 05/09/16 0500  Weight: 112 lb (50.8 kg) 121 lb 14.4 oz (55.3 kg)    Labs & Radiologic Studies     CBC  Recent Labs  05/08/16 0325 05/10/16 0402  WBC 12.1* 7.2  HGB 11.2* 12.5  HCT 34.2* 38.6  MCV 90.0 89.8  PLT 145* 034   Basic Metabolic Panel  Recent Labs  05/09/16 0350 05/10/16 0402  NA 138 140  K 3.9 3.8  CL 108 105  CO2 21* 27  GLUCOSE 74 100*  BUN 17 10  CREATININE 0.84 0.77  CALCIUM 8.9 9.7   Liver Function Tests No results for input(s): AST, ALT, ALKPHOS, BILITOT, PROT, ALBUMIN in the last 72 hours. No results for input(s): LIPASE, AMYLASE in the last 72 hours. Cardiac Enzymes No results for input(s): CKTOTAL, CKMB, CKMBINDEX, TROPONINI in the last 72 hours. BNP Invalid input(s): POCBNP D-Dimer No results for input(s): DDIMER in the last 72 hours. Hemoglobin A1C No results for input(s): HGBA1C in the last 72 hours. Fasting Lipid Panel No results for input(s): CHOL, HDL, LDLCALC, TRIG, CHOLHDL,  LDLDIRECT in the last 72 hours. Thyroid Function Tests No results for input(s): TSH, T4TOTAL, T3FREE, THYROIDAB in the last 72 hours.  Invalid input(s): FREET3  Dg Chest Portable 1 View  Result Date: 05/07/2016 CLINICAL DATA:  Syncope EXAM: PORTABLE CHEST 1 VIEW COMPARISON:  January 07, 2016 FINDINGS: There is no edema or consolidation. Heart is borderline prominent with pulmonary vascularity within normal limits. No adenopathy. There is atherosclerotic calcification in the aorta. There is left carotid artery calcification. Bones are osteoporotic. There is degenerative change in the right shoulder with superior migration of the humeral head, likely reflecting chronic rotator cuff tear. No pneumothorax. IMPRESSION: No edema or consolidation. Heart borderline prominent. Aortic atherosclerosis. Bones osteoporotic. Electronically Signed   By: Lowella Grip III M.D.   On: 05/07/2016 12:44     Diagnostic Studies/Procedures    Echocardiogram: 05/08/2016 Study Conclusions  - Left ventricle: The cavity size was normal. There was mild focal   basal hypertrophy of the septum. Systolic function was vigorous.   The estimated ejection fraction was in the range of 65% to 70%.   There is a LV mid-cavity gradient, peak 51 mmHg. Wall motion was   normal; there were no regional wall motion abnormalities. Doppler   parameters are consistent with abnormal left ventricular   relaxation (grade 1 diastolic dysfunction). - Aortic valve: Trileaflet; moderately calcified leaflets. There   was mild stenosis. There was trivial regurgitation. Mean gradient   (S): 13 mm Hg. - Mitral valve: Moderately calcified annulus. There was no   significant regurgitation. - Left atrium: The atrium was moderately to severely dilated. - Right ventricle: The cavity size was normal. Systolic function   was normal. - Tricuspid valve: Peak RV-RA gradient (S): 28 mm Hg. - Pulmonary arteries: PA peak pressure: 31 mm Hg (S). -  Inferior vena cava: The vessel was normal in size. The   respirophasic diameter changes were in the normal range (>= 50%),   consistent with normal central venous pressure.  Impressions:  - Normal LV size with mild focal basal septal hypertrophy. EF   65-70%. LV mid-cavity gradient to 51 mmHg peak. Normal RV size   and systolic function. Moderate to severe left atrial   enlargement. Mild aortic stenosis.  Disposition   Pt is being discharged home today in good condition.  Follow-up Plans & Appointments    Follow-up Information    Almyra Deforest, [place] Follow up on 05/21/2016.   Specialties:  Cardiology, Radiology Why:  Cardiology Hospital Follow-Up on 05/21/2016 at 1:30PM (Dr. Kennon Holter PA).  Contact information: [address] [place] [address] [phone]          Discharge Instructions    Diet - low sodium heart healthy    Complete by:  As directed    Increase activity slowly    Complete by:  As directed       Discharge Medications     Medication List    STOP taking these medications  carvedilol 12.5 MG tablet Commonly known as:  COREG   digoxin 0.125 MG tablet Commonly known as:  LANOXIN   diltiazem 60 MG tablet Commonly known as:  CARDIZEM   hydrALAZINE 10 MG tablet Commonly known as:  APRESOLINE     TAKE these medications   acetaminophen 500 MG tablet Commonly known as:  TYLENOL Take 500 mg by mouth 4 (four) times daily. Left knee   amiodarone 200 MG tablet Commonly known as:  PACERONE Take 1 tablet by mouth twice daily for 10 days. Starting on 05/20/2016, only take 1 tablet once daily.   apixaban 2.5 MG Tabs tablet Commonly known as:  ELIQUIS Take 1 tablet (2.5 mg total) by mouth 2 (two) times daily.   BIOFREEZE 4 % Gel Generic drug:  Menthol (Topical Analgesic) Apply 1 application topically 4 (four) times daily. Left knee for pain   CALMOSEPTINE 0.44-20.6 % Oint Generic drug:  Menthol-Zinc Oxide Apply 1 application topically  at bedtime as needed (for irritation).   diphenhydrAMINE 2 % cream Commonly known as:  BENADRYL Apply 1 application topically at bedtime.   gabapentin 300 MG capsule Commonly known as:  NEURONTIN Take 300-600 mg by mouth See admin instructions. Pt takes 300mg  daily, takes 600mg  at bedtime   lisinopril 20 MG tablet Commonly known as:  PRINIVIL,ZESTRIL Take 1 tablet (20 mg total) by mouth daily.   loperamide 2 MG tablet Commonly known as:  IMODIUM A-D Take 2 mg by mouth every 8 (eight) hours as needed for diarrhea or loose stools.   meclizine 12.5 MG tablet Commonly known as:  ANTIVERT Take 12.5 mg by mouth every 8 (eight) hours as needed for dizziness.   sertraline 25 MG tablet Commonly known as:  ZOLOFT Take 25 mg by mouth daily.   traMADol 50 MG tablet Commonly known as:  ULTRAM Take 25 mg by mouth every 8 (eight) hours as needed for moderate pain.          Allergies No Known Allergies   Outstanding Labs/Studies   BMET at follow-up appointment.   Duration of Discharge Encounter   Greater than 30 minutes including physician time.  Signed, Erma Heritage, PA-C 05/10/2016, 9:21 AM   See my full note this am.  Lauree Chandler

## 2016-05-11 DIAGNOSIS — I10 Essential (primary) hypertension: Secondary | ICD-10-CM | POA: Diagnosis not present

## 2016-05-11 DIAGNOSIS — I69959 Hemiplegia and hemiparesis following unspecified cerebrovascular disease affecting unspecified side: Secondary | ICD-10-CM | POA: Diagnosis not present

## 2016-05-11 DIAGNOSIS — R269 Unspecified abnormalities of gait and mobility: Secondary | ICD-10-CM | POA: Diagnosis not present

## 2016-05-11 DIAGNOSIS — I4891 Unspecified atrial fibrillation: Secondary | ICD-10-CM | POA: Diagnosis not present

## 2016-05-11 DIAGNOSIS — R69 Illness, unspecified: Secondary | ICD-10-CM | POA: Diagnosis not present

## 2016-05-11 DIAGNOSIS — I455 Other specified heart block: Secondary | ICD-10-CM | POA: Diagnosis not present

## 2016-05-12 ENCOUNTER — Encounter: Payer: Self-pay | Admitting: Physician Assistant

## 2016-05-12 ENCOUNTER — Ambulatory Visit (INDEPENDENT_AMBULATORY_CARE_PROVIDER_SITE_OTHER): Payer: Medicare HMO | Admitting: Physician Assistant

## 2016-05-12 VITALS — BP 160/80 | HR 76 | Ht 60.0 in | Wt 126.0 lb

## 2016-05-12 DIAGNOSIS — Z8673 Personal history of transient ischemic attack (TIA), and cerebral infarction without residual deficits: Secondary | ICD-10-CM

## 2016-05-12 DIAGNOSIS — R0989 Other specified symptoms and signs involving the circulatory and respiratory systems: Secondary | ICD-10-CM

## 2016-05-12 DIAGNOSIS — E785 Hyperlipidemia, unspecified: Secondary | ICD-10-CM | POA: Diagnosis not present

## 2016-05-12 DIAGNOSIS — I480 Paroxysmal atrial fibrillation: Secondary | ICD-10-CM | POA: Diagnosis not present

## 2016-05-12 DIAGNOSIS — Z79899 Other long term (current) drug therapy: Secondary | ICD-10-CM | POA: Diagnosis not present

## 2016-05-12 LAB — CULTURE, BLOOD (ROUTINE X 2)
CULTURE: NO GROWTH
Culture: NO GROWTH

## 2016-05-12 MED ORDER — LISINOPRIL 40 MG PO TABS: 40.0000 mg | ORAL_TABLET | Freq: Every day | ORAL | 11 refills | Status: DC

## 2016-05-12 NOTE — Progress Notes (Signed)
Cardiology Office Note    Date:  05/12/2016   ID:  Virginia Meyer, DOB Jan 13, 1929, MRN [id number]  PCP:  Virginia Poll, MD  Cardiologist:  Dr. Gwenlyn Meyer  Chief Complaint  Patient presents with  . Follow-up    seen for Dr. Gwenlyn Meyer, from hospital visit    History of Present Illness:  Virginia Meyer is a 83 y.o. female with PMH of PAF on eliquis, prior CVA, HTN and HLD. She recently presented to the hospital on 05/07/2016 with complete heart block. She resides in [facility] and uses a wheelchair due to lower extremity weakness from her prior CVAs. Prior to her arrival, she experienced diarrhea and vomiting, she later had a syncopal episode. Upon EMS arrival, her heart rate was in the 30s and underlying rhythm consistent with complete heart block and junctional rhythm. She was given atropine and externally paced. Echocardiogram obtained on 05/08/2016 showed EF 65-70%, mild focal basal hypertrophy of the septum, grade 1 diastolic dysfunction, moderately severe left atrial enlargement. She became more alert following administration of glucagon. Her home digoxin, Coreg and diltiazem were all stopped. She was in sinus rhythm in the hospital, however in anticipation of controlling her history of atrial fibrillation, especially in light of her history of stroke, she was started on IV amiodarone and then transitioned to PO amiodarone. She was able to avoid placing a pacemaker in this case after discontinuation of all 3 week control medications. The plan is to decrease amiodarone to 200 mg daily after 10 days.  She has been doing okay since she left the hospital. She denies any recurrent palpitation or syncope. She did have a episode of fatigue yesterday. On physical exam, she has up heart murmur at the left upper sternal border. She also noted to have a mild carotid bruit on the right. Otherwise she denies any chest pain, shortness of breath since she left the hospital. Her heart rate suddenly has improved.  Her blood pressure has been persistently high, with her advanced age, tolerated a systolic blood pressure from 120s to 140s. I will increase the lisinopril to 40 mg daily. She will need a basic metabolic panel and TSH in 5 days. We will check with Virginia Meyer to make sure they can obtain this. We will obtain carotid Doppler, however most likely reason for her passing out spell was to complete heart block, therefore this would not be urgent. We will get her on the same day she come back for follow-up in 2 months.    Past Medical History:  Diagnosis Date  . Anxiety   . GERD (gastroesophageal reflux disease)   . Heart failure ([place])   . HLD (hyperlipidemia)   . HTN (hypertension)   . Hyperlipidemia   . Major depressive disorder   . Neuropathy ([place])   . PVD (peripheral vascular disease) ([place])   . Stroke [facility])    left sided deficit    Past Surgical History:  Procedure Laterality Date  . ABDOMINAL HYSTERECTOMY    . FOOT NEUROMA SURGERY    . SPINE SURGERY    . TONSILLECTOMY      Current Medications: Outpatient Medications Prior to Visit  Medication Sig Dispense Refill  . acetaminophen (TYLENOL) 500 MG tablet Take 500 mg by mouth 4 (four) times daily. Left knee    . amiodarone (PACERONE) 200 MG tablet Take 1 tablet by mouth twice daily for 10 days. Starting on 05/20/2016, only take 1 tablet once daily. 40 tablet 5  . apixaban (ELIQUIS)  2.5 MG TABS tablet Take 1 tablet (2.5 mg total) by mouth 2 (two) times daily. 60 tablet 2  . diphenhydrAMINE (BENADRYL) 2 % cream Apply 1 application topically at bedtime.    . gabapentin (NEURONTIN) 300 MG capsule Take 300-600 mg by mouth See admin instructions. Pt takes 300mg  daily, takes 600mg  at bedtime    . loperamide (IMODIUM A-D) 2 MG tablet Take 2 mg by mouth every 8 (eight) hours as needed for diarrhea or loose stools.    . meclizine (ANTIVERT) 12.5 MG tablet Take 12.5 mg by mouth every 8 (eight) hours as needed for dizziness.    . Menthol,  Topical Analgesic, (BIOFREEZE) 4 % GEL Apply 1 application topically 4 (four) times daily. Left knee for pain     . Menthol-Zinc Oxide (CALMOSEPTINE) 0.44-20.6 % OINT Apply 1 application topically at bedtime as needed (for irritation).    . traMADol (ULTRAM) 50 MG tablet Take 25 mg by mouth every 8 (eight) hours as needed for moderate pain.    Marland Kitchen lisinopril (PRINIVIL,ZESTRIL) 20 MG tablet Take 1 tablet (20 mg total) by mouth daily. 30 tablet 5  . sertraline (ZOLOFT) 25 MG tablet Take 25 mg by mouth daily.     No facility-administered medications prior to visit.      Allergies:   Patient has no known allergies.   Social History   Social History  . Marital status: Widowed    Spouse name: N/A  . Number of children: 4  . Years of education: 12   Occupational History  . Retired    Social History Main Topics  . Smoking status: Never Smoker  . Smokeless tobacco: Never Used  . Alcohol use No  . Drug use: No  . Sexual activity: Not Asked   Other Topics Concern  . None   Social History Narrative   Lives at [facility] (Fax: [phone])   Caffeine use: 1 cup tea/day   1 cup coffee/day     Family History:  The patient's family history includes Heart attack in her brother.   ROS:   Please see the history of present illness.    ROS All other systems reviewed and are negative.   PHYSICAL EXAM:   VS:  BP (!) 160/80   Pulse 76   Ht 5' (1.524 m)   Wt 126 lb (57.2 kg)   SpO2 97%   BMI 24.61 kg/m    GEN: Well nourished, well developed, in no acute distress  HEENT: normal  Neck: no JVD, or masses +R side carotid bruit Cardiac: RRR; no rubs, or gallops,no edema  2/6 murmur at LUSB Respiratory:  clear to auscultation bilaterally, normal work of breathing GI: soft, nontender, nondistended, + BS MS: no deformity or atrophy  Skin: warm and dry, no rash Neuro:  Alert and Oriented x 3, Strength and sensation are intact Psych: euthymic mood, full affect  Wt Readings  from Last 3 Encounters:  05/12/16 126 lb (57.2 kg)  05/09/16 121 lb 14.4 oz (55.3 kg)  08/21/15 112 lb (50.8 kg)      Studies/Labs Reviewed:   EKG:  EKG is ordered today.  The ekg ordered today demonstrates Sinus rhythm without significant ST-T wave changes.  Recent Labs: 05/26/2015: ALT 15 05/10/2016: BUN 10; Creatinine, Ser 0.77; Hemoglobin 12.5; Platelets 172; Potassium 3.8; Sodium 140   Lipid Panel No results Meyer for: CHOL, TRIG, HDL, CHOLHDL, VLDL, LDLCALC, LDLDIRECT  Additional studies/ records that were reviewed today include:   Echo  05/08/2016 LV EF: 65% -   70%  - Left ventricle: The cavity size was normal. There was mild focal   basal hypertrophy of the septum. Systolic function was vigorous.   The estimated ejection fraction was in the range of 65% to 70%.   There is a LV mid-cavity gradient, peak 51 mmHg. Wall motion was   normal; there were no regional wall motion abnormalities. Doppler   parameters are consistent with abnormal left ventricular   relaxation (grade 1 diastolic dysfunction). - Aortic valve: Trileaflet; moderately calcified leaflets. There   was mild stenosis. There was trivial regurgitation. Mean gradient   (S): 13 mm Hg. - Mitral valve: Moderately calcified annulus. There was no   significant regurgitation. - Left atrium: The atrium was moderately to severely dilated. - Right ventricle: The cavity size was normal. Systolic function   was normal. - Tricuspid valve: Peak RV-RA gradient (S): 28 mm Hg. - Pulmonary arteries: PA peak pressure: 31 mm Hg (S). - Inferior vena cava: The vessel was normal in size. The   respirophasic diameter changes were in the normal range (>= 50%),   consistent with normal central venous pressure.  Impressions:  - Normal LV size with mild focal basal septal hypertrophy. EF   65-70%. LV mid-cavity gradient to 51 mmHg peak. Normal RV size   and systolic function. Moderate to severe left atrial   enlargement. Mild  aortic stenosis.   ASSESSMENT:    1. Bruit   2. Encounter for long-term (current) use of high-risk medication   3. PAF (paroxysmal atrial fibrillation) ([place])   4. H/O: CVA (cerebrovascular accident)   5. Hyperlipidemia, unspecified hyperlipidemia type      PLAN:  In order of problems listed above:  1. Right sided carotid bruit: Meyer on physical exam, plan to obtain carotid Doppler on the same day as her next follow-up.  2. PAF on eliquis: No bleeding issues, recently off her rate control medication has been discontinued due to degeneration of sinus rhythm down to complete heart block. Her heart rate improved and sinus rhythm was restored on IV glucagon. Her heart rate is very fair regular today, she appears to be in sinus rhythm with stable heart rate. There was no sign of recurrence of complete heart block. I will obtain TSH in 5 days.  3. Hypertension: Her digoxin, carvedilol, and a diltiazem were all discontinued recently due to presence of complete heart block. She was switched to lisinopril 20 mg, her blood pressure still elevated today, we will increase to 40 mg daily. With adjustment of lisinopril, we will obtain basic metabolic panel in 5 days.    Medication Adjustments/Labs and Tests Ordered: Current medicines are reviewed at length with the patient today.  Concerns regarding medicines are outlined above.  Medication changes, Labs and Tests ordered today are listed in the Patient Instructions below. Patient Instructions  Your physician has recommended you make the following change in your medication:  INCREASE  LISINOPRIL  TO 40 MG EVERY DAY  DECREASE AMIODARONE TO  200 MG  DAILY  STARTING  05-20-16  Your physician recommends that you return for lab work in:   BMET  AND  TSH   IN 5 DAYS   Your physician recommends that you schedule a follow-up appointment in:  2 [place] has requested that you have a carotid duplex. This test is an  ultrasound of the carotid arteries in your neck. It looks at  blood flow through these arteries that supply the [place] with blood. Allow one hour for this exam. There are no restrictions or special instructions.     Hilbert Corrigan, [place]  05/12/2016 11:37 PM    [facility] [place], [place], [place]  [address] Phone: [phone]; Fax: [phone]

## 2016-05-12 NOTE — Patient Instructions (Signed)
Your physician has recommended you make the following change in your medication:  INCREASE  LISINOPRIL  TO 40 MG EVERY DAY  DECREASE AMIODARONE TO  200 MG  DAILY  STARTING  05-20-16  Your physician recommends that you return for lab work in:   BMET  AND  TSH   IN 5 DAYS   Your physician recommends that you schedule a follow-up appointment in:  2 [place] has requested that you have a carotid duplex. This test is an ultrasound of the carotid arteries in your neck. It looks at blood flow through these arteries that supply the [place] with blood. Allow one hour for this exam. There are no restrictions or special instructions.

## 2016-05-13 ENCOUNTER — Encounter: Payer: Self-pay | Admitting: Neurology

## 2016-05-13 ENCOUNTER — Ambulatory Visit (INDEPENDENT_AMBULATORY_CARE_PROVIDER_SITE_OTHER): Payer: Medicare HMO | Admitting: Neurology

## 2016-05-13 VITALS — BP 198/97 | HR 74

## 2016-05-13 DIAGNOSIS — R269 Unspecified abnormalities of gait and mobility: Secondary | ICD-10-CM | POA: Diagnosis not present

## 2016-05-13 DIAGNOSIS — G8194 Hemiplegia, unspecified affecting left nondominant side: Secondary | ICD-10-CM

## 2016-05-13 DIAGNOSIS — I63321 Cerebral infarction due to thrombosis of right anterior cerebral artery: Secondary | ICD-10-CM

## 2016-05-13 NOTE — Progress Notes (Signed)
PATIENT: Virginia Meyer DOB: 11-Dec-1928  Chief Complaint  Patient presents with  . Spasticity    She is here with her daughter, [name].  They would like to discuss possible treament with Botox injections.  She was referred by Dr. Jaynee Eagles.     HISTORICAL  Shontay Filip, is accompanied by her daughter at today's clinical visit, she is referred by Dr. Jaynee Eagles for evaluation of possible Botox injection to alleviate her left lower extremity spasticity, to improve her gait abnormality.  I reviewed extensive previous neurology, hospital admission note,  She had a history of hypertension, hyperlipidemia, congestive heart failure, atrial fibrillation on Eliquis treatment, coronary artery disease, peripheral neuropathy, peripheral vascular disease, she was admitted to local hospital in [place] in April 2017 for acute onset of left facial droop, left-sided weakness, with atrial fibrillation, rapid ventricular rate, NIH stroke scale was 13 upon initial presentation, she was given IV TPA, MRI of the [place] showed large right MCA acute stroke with mild mass effect. MRA showed right M1 proximal occlusion, ultrasound of carotid artery showed more than 70% left internal carotid artery stenosis, less than 40% right internal carotid artery stenosis. DVT was negative, CT of the chest abdomen pelvic showed no significant abnormality.  She was discharged to a nursing facility, moved from [place] to be close to her daughter.  She had a history of lumbar decompression surgery around 2015, with significant left foot weakness following the lumbar decompression surgery, already has mild gait abnormality, but was able to live independently.  Since her stroke in April 2017, she had rapid decline, she is no longer able to ambulate, could not bear weight, could not transfer herself,     During her evaluation by Dr. Jaynee Eagles in August 2017, she was noted to have significant gait abnormality, left lower extremity  contraction, she is referred to my clinic for evaluation of possible EMG guided Botox injection to alleviate her left lower extremity spasticity, to stabilize posture.  She currently lives at assistant living [facility], wheelchair bound, she was seen by facility primary care physician Dr. Reymundo Poll on a weekly basis.  During today's examination, she was found to have fixed left knee contraction, maximum extension is 150, with tight hamstring cord at his left knee, profound left ankle dorsiflexion/plantar flexion weakness from her previous residual deficit of lumbar decompression surgery. But the most striking abnormality is her significant difficulty initiate right leg movement even with 2 people assistant, significant retropulsed instability.  I have personally reviewed CT head without contrast in November 2017, large size right MCA stroke, sparing right posterior limb of internal capsule/basal ganglion, periventricular small vessel disease, no acute abnormality.   Her right leg difficulty were not explained by her right MCA stroke, patient also reported that she had gradual onset urinary incontinence few years prior to her stroke in April 2017. Gait abnormality she was always attributed to her left ankle problem.  REVIEW OF SYSTEMS: Full 14 system review of systems performed and notable only for gait abnormality incontinence of bladder, joint pain, swelling, walking difficulty.  ALLERGIES: No Known Allergies  HOME MEDICATIONS: Current Outpatient Prescriptions  Medication Sig Dispense Refill  . acetaminophen (TYLENOL) 500 MG tablet Take 500 mg by mouth 4 (four) times daily. Left knee    . amiodarone (PACERONE) 200 MG tablet Take 1 tablet by mouth twice daily for 10 days. Starting on 05/20/2016, only take 1 tablet once daily. 40 tablet 5  . apixaban (ELIQUIS) 2.5 MG TABS tablet  Take 1 tablet (2.5 mg total) by mouth 2 (two) times daily. 60 tablet 2  . diphenhydrAMINE (BENADRYL) 2 % cream  Apply 1 application topically at bedtime.    . gabapentin (NEURONTIN) 300 MG capsule Take 300-600 mg by mouth See admin instructions. Pt takes 300mg  daily, takes 600mg  at bedtime    . lisinopril (PRINIVIL,ZESTRIL) 40 MG tablet Take 1 tablet (40 mg total) by mouth daily. 30 tablet 11  . loperamide (IMODIUM A-D) 2 MG tablet Take 2 mg by mouth every 8 (eight) hours as needed for diarrhea or loose stools.    . meclizine (ANTIVERT) 12.5 MG tablet Take 12.5 mg by mouth every 8 (eight) hours as needed for dizziness.    . Menthol, Topical Analgesic, (BIOFREEZE) 4 % GEL Apply 1 application topically 4 (four) times daily. Left knee for pain     . Menthol-Zinc Oxide (CALMOSEPTINE) 0.44-20.6 % OINT Apply 1 application topically at bedtime as needed (for irritation).    . traMADol (ULTRAM) 50 MG tablet Take 25 mg by mouth every 8 (eight) hours as needed for moderate pain.     No current facility-administered medications for this visit.     PAST MEDICAL HISTORY: Past Medical History:  Diagnosis Date  . Anxiety   . GERD (gastroesophageal reflux disease)   . Heart failure ([place])   . HLD (hyperlipidemia)   . HTN (hypertension)   . Hyperlipidemia   . Major depressive disorder   . Neuropathy ([place])   . PVD (peripheral vascular disease) ([place])   . Stroke [facility])    left sided deficit    PAST SURGICAL HISTORY: Past Surgical History:  Procedure Laterality Date  . ABDOMINAL HYSTERECTOMY    . FOOT NEUROMA SURGERY    . SPINE SURGERY    . TONSILLECTOMY      FAMILY HISTORY: Family History  Problem Relation Age of Onset  . Heart attack Brother   . Stroke Neg Hx   . Neuropathy Neg Hx     SOCIAL HISTORY:  Social History   Social History  . Marital status: Widowed    Spouse name: N/A  . Number of children: 4  . Years of education: 12   Occupational History  . Retired    Social History Main Topics  . Smoking status: Never Smoker  . Smokeless tobacco: Never Used  . Alcohol use No  . Drug use:  No  . Sexual activity: Not on file   Other Topics Concern  . Not on file   Social History Narrative   Lives at [facility] (Fax: [phone])   Caffeine use: 1 cup tea/day   1 cup coffee/day     PHYSICAL EXAM   Vitals:   05/13/16 1046  BP: (!) 198/97  Pulse: 74    Not recorded      There is no height or weight on file to calculate BMI.  PHYSICAL EXAMNIATION:  Gen: NAD, conversant, well nourised, obese, well groomed                     Cardiovascular: Regular rate rhythm, no peripheral edema, warm, nontender. Eyes: Conjunctivae clear without exudates or hemorrhage Neck: Supple, no carotid bruits. Pulmonary: Clear to auscultation bilaterally   NEUROLOGICAL EXAM:  MENTAL STATUS: Speech:    Speech is normal; fluent and spontaneous with normal comprehension.  Cognition:     Orientation to time, place and person     Normal recent and remote memory  Normal Attention span and concentration     Normal Language, naming, repeating,spontaneous speech     Fund of knowledge   CRANIAL NERVES: CN II: She has left visual field deficit up upon spontaneous stimulation, pupils were equal round reactive to light CN III, IV, VI: extraocular movement are normal. No ptosis. CN V: Facial sensation is intact to pinprick in all 3 divisions bilaterally. Corneal responses are intact.  CN VII: Mild left lower face weakness CN VIII: Hearing is normal to rubbing fingers CN IX, X: Palate elevates symmetrically. Phonation is normal. CN XI: Head turning and shoulder shrug are intact CN XII: Tongue is midline with normal movements and no atrophy.  MOTOR: Fixed contraction of left knee, maximum extension 150, tight hamstring cord, antigravity movement of left proximal lower extremity muscles, profound left ankle dorsi flexion/plantar flexion weakness, 2/5, only mild left upper extremity pronation drift, mild left hand grip weakness. Mild spasticity of right lower  extremity.  REFLEXES: Reflexes are hyperactive and symmetric at the biceps, triceps, knees, absent at the left ankle.  SENSORY: Decreased light touch pinprick vibratory sensation to bilateral knee level left worse than right.  COORDINATION: here is no dysmetria on finger-to-nose and heel-knee-shin.    GAIT/STANCE: She needs 2 people assistant to get up from seated position, tendency to lean backwards, could not bear weight by herself, was able to use her weaker left leg better, but has significant difficulty initiate ambulation with right leg, difficulty clear right foot from the floor.    DIAGNOSTIC DATA (LABS, IMAGING, TESTING) - I reviewed patient records, labs, notes, testing and imaging myself where available.   ASSESSMENT AND PLAN  Taysia Rivere is a 83 y.o. female   Gait abnormality  Right MCA stroke, with residual left hemianopia, left hemiparesis.  Her profound gait abnormality would not be explained by her right MCA stroke alone  She was noted to have bilateral upper and lower extremity hyperreflexia, gradual worsening urinary incontinence primarily to her stroke,   differentiation diagnosis of her gait abnormality also including cervical spondylitic myelopathy, with superimposed lumbar radiculopathy  Proceed with MRI of cervical spine  I do not think the fixed contraction of her left lower extremity would benefit EMG guided Botox injection.  Marcial Pacas, M.D. Ph.D.  [facility] [place], [place] [address] Phone: [phone] Fax:      [phone]   Marcial Pacas, M.D. Ph.D.  [facility] [address], [place], [place] [address] Ph: [phone] Fax: [phone]  CC: Referring Provider

## 2016-05-17 ENCOUNTER — Emergency Department (HOSPITAL_COMMUNITY)
Admission: EM | Admit: 2016-05-17 | Discharge: 2016-05-17 | Disposition: A | Discharge status: Home / Self Care | Payer: Medicare HMO | Attending: Emergency Medicine | Admitting: Emergency Medicine

## 2016-05-17 ENCOUNTER — Emergency Department (HOSPITAL_COMMUNITY): Payer: Medicare HMO

## 2016-05-17 ENCOUNTER — Encounter (HOSPITAL_COMMUNITY): Payer: Self-pay

## 2016-05-17 DIAGNOSIS — Y999 Unspecified external cause status: Secondary | ICD-10-CM | POA: Insufficient documentation

## 2016-05-17 DIAGNOSIS — S0003XA Contusion of scalp, initial encounter: Secondary | ICD-10-CM | POA: Diagnosis not present

## 2016-05-17 DIAGNOSIS — I2510 Atherosclerotic heart disease of native coronary artery without angina pectoris: Secondary | ICD-10-CM | POA: Insufficient documentation

## 2016-05-17 DIAGNOSIS — Y9389 Activity, other specified: Secondary | ICD-10-CM | POA: Diagnosis not present

## 2016-05-17 DIAGNOSIS — I110 Hypertensive heart disease with heart failure: Secondary | ICD-10-CM | POA: Insufficient documentation

## 2016-05-17 DIAGNOSIS — Y92002 Bathroom of unspecified non-institutional (private) residence single-family (private) house as the place of occurrence of the external cause: Secondary | ICD-10-CM | POA: Insufficient documentation

## 2016-05-17 DIAGNOSIS — S199XXA Unspecified injury of neck, initial encounter: Secondary | ICD-10-CM | POA: Diagnosis not present

## 2016-05-17 DIAGNOSIS — I509 Heart failure, unspecified: Secondary | ICD-10-CM | POA: Diagnosis not present

## 2016-05-17 DIAGNOSIS — Z8673 Personal history of transient ischemic attack (TIA), and cerebral infarction without residual deficits: Secondary | ICD-10-CM | POA: Insufficient documentation

## 2016-05-17 DIAGNOSIS — W1812XA Fall from or off toilet with subsequent striking against object, initial encounter: Secondary | ICD-10-CM | POA: Insufficient documentation

## 2016-05-17 DIAGNOSIS — Z7901 Long term (current) use of anticoagulants: Secondary | ICD-10-CM | POA: Insufficient documentation

## 2016-05-17 DIAGNOSIS — T148XXA Other injury of unspecified body region, initial encounter: Secondary | ICD-10-CM

## 2016-05-17 DIAGNOSIS — S0990XA Unspecified injury of head, initial encounter: Secondary | ICD-10-CM | POA: Diagnosis not present

## 2016-05-17 DIAGNOSIS — W19XXXA Unspecified fall, initial encounter: Secondary | ICD-10-CM

## 2016-05-17 DIAGNOSIS — S0091XA Abrasion of unspecified part of head, initial encounter: Secondary | ICD-10-CM | POA: Diagnosis not present

## 2016-05-17 DIAGNOSIS — S0001XA Abrasion of scalp, initial encounter: Secondary | ICD-10-CM | POA: Diagnosis not present

## 2016-05-17 DIAGNOSIS — Z79899 Other long term (current) drug therapy: Secondary | ICD-10-CM | POA: Insufficient documentation

## 2016-05-17 LAB — CBC
HEMATOCRIT: 40.9 % (ref 36.0–46.0)
Hemoglobin: 13.6 g/dL (ref 12.0–15.0)
MCH: 29.8 pg (ref 26.0–34.0)
MCHC: 33.3 g/dL (ref 30.0–36.0)
MCV: 89.7 fL (ref 78.0–100.0)
Platelets: 210 10*3/uL (ref 150–400)
RBC: 4.56 MIL/uL (ref 3.87–5.11)
RDW: 14.2 % (ref 11.5–15.5)
WBC: 6.7 10*3/uL (ref 4.0–10.5)

## 2016-05-17 LAB — BASIC METABOLIC PANEL
ANION GAP: 11 (ref 5–15)
BUN: 13 mg/dL (ref 6–20)
CALCIUM: 9.6 mg/dL (ref 8.9–10.3)
CHLORIDE: 102 mmol/L (ref 101–111)
CO2: 25 mmol/L (ref 22–32)
Creatinine, Ser: 1.07 mg/dL — ABNORMAL HIGH (ref 0.44–1.00)
GFR calc Af Amer: 53 mL/min — ABNORMAL LOW (ref >60)
GFR calc non Af Amer: 46 mL/min — ABNORMAL LOW (ref >60)
GLUCOSE: 99 mg/dL (ref 65–99)
Potassium: 3.8 mmol/L (ref 3.5–5.1)
Sodium: 138 mmol/L (ref 135–145)

## 2016-05-17 MED ORDER — LABETALOL HCL 5 MG/ML IV SOLN
10.0000 mg | Freq: Once | INTRAVENOUS | Status: AC
  Administered 2016-05-17: 10 mg via INTRAVENOUS
  Filled 2016-05-17: qty 4

## 2016-05-17 NOTE — ED Notes (Signed)
Called CT to request immediate priority CT head due to BP increase

## 2016-05-17 NOTE — ED Triage Notes (Signed)
Pt arrived via [facility] from assisted living fall on blood thinners.  Pt c/o right leg pain, left forehead abrasion and hematoma, bleeding controlled.  2017 stroke with left sided deficits pt is wheelchair dependent.

## 2016-05-17 NOTE — ED Notes (Signed)
Pt stable, states understanding of discharge instructions 

## 2016-05-17 NOTE — Discharge Instructions (Signed)
Please read and follow all provided instructions.  Your diagnoses today include:  1. Fall, initial encounter   2. Hematoma of scalp, initial encounter   3. Abrasion     Tests performed today include: Vital signs. See below for your results today.   Medications prescribed:  Take as prescribed   Home care instructions:  Follow any educational materials contained in this packet.  Follow-up instructions: Please follow-up with your primary care provider for further evaluation of symptoms and treatment   Return instructions:  Please return to the Emergency Department if you do not get better, if you get worse, or new symptoms OR  - Fever (temperature greater than 101.13F)  - Bleeding that does not stop with holding pressure to the area    -Severe pain (please note that you may be more sore the day after your accident)  - Chest Pain  - Difficulty breathing  - Severe nausea or vomiting  - Inability to tolerate food and liquids  - Passing out  - Skin becoming red around your wounds  - Change in mental status (confusion or lethargy)  - New numbness or weakness    Please return if you have any other emergent concerns.  Additional Information:  Your vital signs today were: BP (!) 177/97    Pulse 62    Temp 97.9 F (36.6 C) (Oral)    Resp 16    Ht 5' (1.524 m)    Wt 57.2 kg    SpO2 95%    BMI 24.61 kg/m  If your blood pressure (BP) was elevated above 135/85 this visit, please have this repeated by your doctor within one month. --------------

## 2016-05-17 NOTE — ED Provider Notes (Signed)
[facility] DEPT Provider Note   CSN: [id number] Arrival date & time: 05/17/16  1902     History   Chief Complaint Chief Complaint  Patient presents with  . Fall    HPI Virginia Meyer is a 83 y.o. female.  HPI  83 y.o. female with a hx of hypertension, hyperlipidemia, congestive heart failure, atrial fibrillation on Eliquis treatment, coronary artery disease, peripheral neuropathy, peripheral vascular disease, presents to the Emergency Department today from assisted living facility due to fall. Pt states that she was on the commode when she leaned to the left and struck her head. Notes no LOC. No N/V. No visual changes. Hematoma noted on left side. Pt is on Eliquis due to atrial fibrillation. Noted hx of stroke last year with left sided deficits. Pt baseline wheelchair dependent. Took BP meds this AM. No other symptoms noted.   Chart review with Neurology office visit: She was admitted to local hospital in [place] in April 2017 for acute onset of left facial droop, left-sided weakness, with atrial fibrillation, rapid ventricular rate, NIH stroke scale was 13 upon initial presentation, she was given IV TPA, MRI of the [place] showed large right MCA acute stroke with mild mass effect. MRA showed right M1 proximal occlusion, ultrasound of carotid artery showed more than 70% left internal carotid artery stenosis, less than 40% right internal carotid artery stenosis.   Past Medical History:  Diagnosis Date  . Anxiety   . GERD (gastroesophageal reflux disease)   . Heart failure ([place])   . HLD (hyperlipidemia)   . HTN (hypertension)   . Hyperlipidemia   . Major depressive disorder   . Neuropathy ([place])   . PVD (peripheral vascular disease) ([place])   . Stroke [facility])    left sided deficit    Patient Active Problem List   Diagnosis Date Noted  . Gait abnormality 05/13/2016  . Cerebrovascular accident (CVA) due to thrombosis of right anterior cerebral artery ([place]) 05/13/2016  . Complete  heart block ([place]) 05/07/2016  . Benign essential HTN 08/21/2015  . Acute CVA (cerebrovascular accident) ([place]) 08/04/2015  . Left hemiparesis ([place]) 08/04/2015  . Primary osteoarthritis of left knee 08/04/2015  . Esophageal reflux 08/04/2015  . Hyperlipidemia LDL goal <70 08/04/2015  . Paroxysmal atrial fibrillation ([place]) 08/04/2015  . PVD (peripheral vascular disease) ([place]) 08/04/2015  . Protein-calorie malnutrition ([place]) 08/04/2015    Past Surgical History:  Procedure Laterality Date  . ABDOMINAL HYSTERECTOMY    . FOOT NEUROMA SURGERY    . SPINE SURGERY    . TONSILLECTOMY      OB History    No data available       Home Medications    Prior to Admission medications   Medication Sig Start Date End Date Taking? Authorizing Provider  acetaminophen (TYLENOL) 500 MG tablet Take 500 mg by mouth 4 (four) times daily. Left knee    Historical Provider, MD  amiodarone (PACERONE) 200 MG tablet Take 1 tablet by mouth twice daily for 10 days. Starting on 05/20/2016, only take 1 tablet once daily. 05/10/16   Erma Heritage, PA  apixaban (ELIQUIS) 2.5 MG TABS tablet Take 1 tablet (2.5 mg total) by mouth 2 (two) times daily. 10/16/15   Rosalin Hawking, MD  diphenhydrAMINE (BENADRYL) 2 % cream Apply 1 application topically at bedtime.    Historical Provider, MD  gabapentin (NEURONTIN) 300 MG capsule Take 300-600 mg by mouth See admin instructions. Pt takes 300mg  daily, takes 600mg  at bedtime 10/02/15   Historical Provider,  MD  lisinopril (PRINIVIL,ZESTRIL) 40 MG tablet Take 1 tablet (40 mg total) by mouth daily. 05/12/16   Almyra Deforest, PA  loperamide (IMODIUM A-D) 2 MG tablet Take 2 mg by mouth every 8 (eight) hours as needed for diarrhea or loose stools.    Historical Provider, MD  meclizine (ANTIVERT) 12.5 MG tablet Take 12.5 mg by mouth every 8 (eight) hours as needed for dizziness.    Historical Provider, MD  Menthol, Topical Analgesic, (BIOFREEZE) 4 % GEL Apply 1 application topically 4 (four) times  daily. Left knee for pain     Historical Provider, MD  Menthol-Zinc Oxide (CALMOSEPTINE) 0.44-20.6 % OINT Apply 1 application topically at bedtime as needed (for irritation).    Historical Provider, MD  traMADol (ULTRAM) 50 MG tablet Take 25 mg by mouth every 8 (eight) hours as needed for moderate pain.    Historical Provider, MD    Family History Family History  Problem Relation Age of Onset  . Heart attack Brother   . Stroke Neg Hx   . Neuropathy Neg Hx     Social History Social History  Substance Use Topics  . Smoking status: Never Smoker  . Smokeless tobacco: Never Used  . Alcohol use No     Allergies   Patient has no known allergies.   Review of Systems Review of Systems ROS reviewed and all are negative for acute change except as noted in the HPI.  Physical Exam Updated Vital Signs BP (!) 239/78 (BP Location: Right Arm)   Pulse 60   Temp 97.9 F (36.6 C) (Oral)   Resp 14   Ht 5' (1.524 m)   Wt 57.2 kg   SpO2 97%   BMI 24.61 kg/m   Physical Exam  Constitutional: She is oriented to person, place, and time. Vital signs are normal. She appears well-developed and well-nourished. No distress.  HENT:  Head: Normocephalic and atraumatic. Head is without raccoon's eyes and without Battle's sign.  Right Ear: Hearing normal. No hemotympanum.  Left Ear: Hearing normal. No hemotympanum.  Nose: Nose normal.  Mouth/Throat: Uvula is midline, oropharynx is clear and moist and mucous membranes are normal.  Hematoma noted to left scalp. Bleeding controlled.   Eyes: Conjunctivae and EOM are normal. Pupils are equal, round, and reactive to light.  Pupils equal right 2 mm. left 8mm  Neck: Trachea normal and normal range of motion. Neck supple. No spinous process tenderness and no muscular tenderness present. No tracheal deviation and normal range of motion present.  Cardiovascular: Normal rate, regular rhythm, S1 normal, S2 normal, normal heart sounds, intact distal pulses and  normal pulses.   Pulmonary/Chest: Effort normal and breath sounds normal. No respiratory distress. She has no decreased breath sounds. She has no wheezes. She has no rhonchi. She has no rales.  Abdominal: Normal appearance and bowel sounds are normal. There is no tenderness. There is no rigidity and no guarding.  Musculoskeletal: Normal range of motion.  Neurological: She is alert and oriented to person, place, and time. She has normal strength. No cranial nerve deficit or sensory deficit.  Cranial Nerves:  II: Pupils equal, round, reactive to light III,IV, VI: ptosis not present, extra-ocular motions intact bilaterally  V,VII: smile symmetric, facial light touch sensation equal VIII: hearing grossly normal bilaterally  IX,X: midline uvula rise  XI: bilateral shoulder shrug equal and strong XII: midline tongue extension  Skin: Skin is warm and dry.  Psychiatric: She has a normal mood and affect. Her speech is  normal and behavior is normal. Thought content normal.  Nursing note and vitals reviewed.    ED Treatments / Results  Labs (all labs ordered are listed, but only abnormal results are displayed) Labs Reviewed - No data to display  EKG  EKG Interpretation None       Radiology Ct Head Wo Contrast  Result Date: 05/17/2016 CLINICAL DATA:  Patient fell hitting left side of head. Patient on blood thinners. EXAM: CT HEAD WITHOUT CONTRAST CT CERVICAL SPINE WITHOUT CONTRAST TECHNIQUE: Multidetector CT imaging of the head and cervical spine was performed following the standard protocol without intravenous contrast. Multiplanar CT image reconstructions of the cervical spine were also generated. COMPARISON:  01/07/2016 CT FINDINGS: CT HEAD FINDINGS [place]: Right MCA distribution encephalomalacia as before. Mild-to-moderate degree of periventricular white matter chronic small vessel ischemia. No intraparenchymal hemorrhage. No extra-axial fluid collection. No intra-axial mass or midline  shift. Vascular: Atherosclerosis of the carotid siphons. Skull: Nonacute. Sinuses/Orbits: Clear mastoids and paranasal sinuses. Intact globes bilaterally. Other: Left frontal scalp hematoma. CT CERVICAL SPINE FINDINGS Alignment: Normal. Skull base and vertebrae: Intact skullbase. Intact atlantodental interval and craniocervical relationship. Mild osteoarthritic joint space narrowing of the atlantodental interval. Soft tissues and spinal canal: No prevertebral fluid or swelling. No visible canal hematoma. Disc levels: Cervical spondylosis with multilevel disc space narrowing from C3 through C7 with small posterior marginal osteophytes. No significant neural foraminal encroachment. Bilateral multilevel facet arthropathy. Upper chest: Moderate atherosclerosis of the visualized aortic arch. Extracranial carotid arteriosclerosis. Other: None IMPRESSION: 1. Chronic right MCA distribution infarct with encephalomalacia. No acute intracranial abnormality. 2. Cervical spondylosis without acute posttraumatic fracture or subluxation. Electronically Signed   By: Ashley Royalty M.D.   On: 05/17/2016 20:59   Ct Cervical Spine Wo Contrast  Result Date: 05/17/2016 CLINICAL DATA:  Patient fell hitting left side of head. Patient on blood thinners. EXAM: CT HEAD WITHOUT CONTRAST CT CERVICAL SPINE WITHOUT CONTRAST TECHNIQUE: Multidetector CT imaging of the head and cervical spine was performed following the standard protocol without intravenous contrast. Multiplanar CT image reconstructions of the cervical spine were also generated. COMPARISON:  01/07/2016 CT FINDINGS: CT HEAD FINDINGS [place]: Right MCA distribution encephalomalacia as before. Mild-to-moderate degree of periventricular white matter chronic small vessel ischemia. No intraparenchymal hemorrhage. No extra-axial fluid collection. No intra-axial mass or midline shift. Vascular: Atherosclerosis of the carotid siphons. Skull: Nonacute. Sinuses/Orbits: Clear mastoids and  paranasal sinuses. Intact globes bilaterally. Other: Left frontal scalp hematoma. CT CERVICAL SPINE FINDINGS Alignment: Normal. Skull base and vertebrae: Intact skullbase. Intact atlantodental interval and craniocervical relationship. Mild osteoarthritic joint space narrowing of the atlantodental interval. Soft tissues and spinal canal: No prevertebral fluid or swelling. No visible canal hematoma. Disc levels: Cervical spondylosis with multilevel disc space narrowing from C3 through C7 with small posterior marginal osteophytes. No significant neural foraminal encroachment. Bilateral multilevel facet arthropathy. Upper chest: Moderate atherosclerosis of the visualized aortic arch. Extracranial carotid arteriosclerosis. Other: None IMPRESSION: 1. Chronic right MCA distribution infarct with encephalomalacia. No acute intracranial abnormality. 2. Cervical spondylosis without acute posttraumatic fracture or subluxation. Electronically Signed   By: Ashley Royalty M.D.   On: 05/17/2016 20:59    Procedures Procedures (including critical care time)  Medications Ordered in ED Medications - No data to display   Initial Impression / Assessment and Plan / ED Course  I have reviewed the triage vital signs and the nursing notes.  Pertinent labs & imaging results that were available during my care of the patient were reviewed by  me and considered in my medical decision making (see chart for details).  Final Clinical Impressions(s) / ED Diagnoses  {I have reviewed and evaluated the relevant laboratory values. {I have reviewed and evaluated the relevant imaging studies.  {I have reviewed the relevant previous healthcare records.  {I obtained HPI from historian. {Patient discussed with supervising physician.  ED Course:  Assessment: Pt is a 83 y.o. female with hx hx of hypertension, hyperlipidemia, congestive heart failure, atrial fibrillation on Eliquis treatment, coronary artery disease, peripheral neuropathy,  peripheral vascular disease who presents due to mechanical fall from toilet. Struck left side of head. No LOC. No N/V. No visual changes. No pain currently. On exam, pt in NAD. Nontoxic/nonseptic appearing. VS. Noted hypertension with 659D systolic. Concern for intracranial bleed. Afebrile. Lungs CTA. Heart RRR. Abdomen nontender soft. CN evaluated and unremarkable. Given BP, CT ordered, which was unremarkable. C spine unremarkable Labs unremarkable. Given 10mg  labetolol in ED with improvement of BP. When pt was asleep, her BP went to 357 systolic prior to labetolol. Seen by supervising physician. Provided wound care to abrasion on scalp. Plan is to DC home with close follow up to PCP. At time of discharge, Patient is in no acute distress. Vital Signs are stable. Patient is able to ambulate. Patient able to tolerate PO.   Disposition/Plan:  DC Home Additional Verbal discharge instructions given and discussed with patient.  Pt Instructed to f/u with PCP in the next week for evaluation and treatment of symptoms. Return precautions given Pt acknowledges and agrees with plan  Supervising Physician Daleen Bo, MD  Final diagnoses:  Fall, initial encounter  Hematoma of scalp, initial encounter  Abrasion    New Prescriptions New Prescriptions   No medications on file     Shary Decamp, PA-C 05/17/16 [place], MD 05/18/16 1008

## 2016-05-17 NOTE — ED Provider Notes (Signed)
  Face-to-face evaluation   History: Was with caregivers, outside her bathroom door when she fell striking her head on the vanity.  There was no loss of consciousness.  She has been mentating normally since then.  Physical exam: Alert, cooperative.  Contusion skin injury, left forehead.  Pupils are equal, right 2 mm left 3-4 mm.  Clumsy left arm and leg, with some preserved strength.  Patient is alert conversant and calm.  She is confused.  Medical screening examination/treatment/procedure(s) were conducted as a shared visit with non-physician practitioner(s) and myself.  I personally evaluated the patient during the encounter   Daleen Bo, MD 05/18/16 1008

## 2016-05-17 NOTE — ED Notes (Signed)
Cleaned pt's wound with saline soaked gauze. Non-adherent (Telfa) dressing was used to cover up the wound and butterfly tape was applied to hold the Telfa in place.

## 2016-05-18 DIAGNOSIS — M1990 Unspecified osteoarthritis, unspecified site: Secondary | ICD-10-CM | POA: Diagnosis not present

## 2016-05-18 DIAGNOSIS — Z79899 Other long term (current) drug therapy: Secondary | ICD-10-CM | POA: Diagnosis not present

## 2016-05-18 DIAGNOSIS — R69 Illness, unspecified: Secondary | ICD-10-CM | POA: Diagnosis not present

## 2016-05-18 DIAGNOSIS — I4891 Unspecified atrial fibrillation: Secondary | ICD-10-CM | POA: Diagnosis not present

## 2016-05-18 DIAGNOSIS — G629 Polyneuropathy, unspecified: Secondary | ICD-10-CM | POA: Diagnosis not present

## 2016-05-18 DIAGNOSIS — I10 Essential (primary) hypertension: Secondary | ICD-10-CM | POA: Diagnosis not present

## 2016-05-18 DIAGNOSIS — Z993 Dependence on wheelchair: Secondary | ICD-10-CM | POA: Diagnosis not present

## 2016-05-18 DIAGNOSIS — R1310 Dysphagia, unspecified: Secondary | ICD-10-CM | POA: Diagnosis not present

## 2016-05-18 DIAGNOSIS — R269 Unspecified abnormalities of gait and mobility: Secondary | ICD-10-CM | POA: Diagnosis not present

## 2016-05-20 ENCOUNTER — Ambulatory Visit: Payer: Medicare HMO | Admitting: Cardiology

## 2016-05-20 DIAGNOSIS — R1312 Dysphagia, oropharyngeal phase: Secondary | ICD-10-CM | POA: Diagnosis not present

## 2016-05-21 ENCOUNTER — Ambulatory Visit: Payer: Medicare HMO | Admitting: Physician Assistant

## 2016-05-25 DIAGNOSIS — I4891 Unspecified atrial fibrillation: Secondary | ICD-10-CM | POA: Diagnosis not present

## 2016-05-25 DIAGNOSIS — R69 Illness, unspecified: Secondary | ICD-10-CM | POA: Diagnosis not present

## 2016-05-25 DIAGNOSIS — I679 Cerebrovascular disease, unspecified: Secondary | ICD-10-CM | POA: Diagnosis not present

## 2016-05-25 DIAGNOSIS — G629 Polyneuropathy, unspecified: Secondary | ICD-10-CM | POA: Diagnosis not present

## 2016-05-25 DIAGNOSIS — I10 Essential (primary) hypertension: Secondary | ICD-10-CM | POA: Diagnosis not present

## 2016-06-07 DIAGNOSIS — R1312 Dysphagia, oropharyngeal phase: Secondary | ICD-10-CM | POA: Diagnosis not present

## 2016-06-10 DIAGNOSIS — R1312 Dysphagia, oropharyngeal phase: Secondary | ICD-10-CM | POA: Diagnosis not present

## 2016-06-10 DIAGNOSIS — R454 Irritability and anger: Secondary | ICD-10-CM | POA: Diagnosis not present

## 2016-06-10 DIAGNOSIS — R69 Illness, unspecified: Secondary | ICD-10-CM | POA: Diagnosis not present

## 2016-06-10 DIAGNOSIS — F341 Dysthymic disorder: Secondary | ICD-10-CM | POA: Diagnosis not present

## 2016-06-15 ENCOUNTER — Ambulatory Visit: Payer: Medicare HMO | Admitting: Neurology

## 2016-06-15 DIAGNOSIS — E782 Mixed hyperlipidemia: Secondary | ICD-10-CM | POA: Diagnosis not present

## 2016-06-15 DIAGNOSIS — N183 Chronic kidney disease, stage 3 (moderate): Secondary | ICD-10-CM | POA: Diagnosis not present

## 2016-06-15 DIAGNOSIS — I10 Essential (primary) hypertension: Secondary | ICD-10-CM | POA: Diagnosis not present

## 2016-06-15 DIAGNOSIS — E039 Hypothyroidism, unspecified: Secondary | ICD-10-CM | POA: Diagnosis not present

## 2016-06-15 DIAGNOSIS — E119 Type 2 diabetes mellitus without complications: Secondary | ICD-10-CM | POA: Diagnosis not present

## 2016-06-15 DIAGNOSIS — R0600 Dyspnea, unspecified: Secondary | ICD-10-CM | POA: Diagnosis not present

## 2016-06-16 DIAGNOSIS — R1312 Dysphagia, oropharyngeal phase: Secondary | ICD-10-CM | POA: Diagnosis not present

## 2016-06-29 ENCOUNTER — Ambulatory Visit (INDEPENDENT_AMBULATORY_CARE_PROVIDER_SITE_OTHER): Payer: Medicare HMO | Admitting: Neurology

## 2016-06-29 ENCOUNTER — Encounter: Payer: Self-pay | Admitting: Neurology

## 2016-06-29 VITALS — BP 105/48 | HR 51 | Ht 60.0 in | Wt 119.0 lb

## 2016-06-29 DIAGNOSIS — Z8673 Personal history of transient ischemic attack (TIA), and cerebral infarction without residual deficits: Secondary | ICD-10-CM | POA: Diagnosis not present

## 2016-06-29 DIAGNOSIS — G8110 Spastic hemiplegia affecting unspecified side: Secondary | ICD-10-CM | POA: Diagnosis not present

## 2016-06-29 DIAGNOSIS — R269 Unspecified abnormalities of gait and mobility: Secondary | ICD-10-CM

## 2016-06-29 NOTE — Progress Notes (Signed)
[facility]    Provider:  Dr Jaynee Eagles Referring Provider: Reymundo Poll, MD Primary Care Physician:  Reymundo Poll, MD  CC: Previous CVA  Interval history 06/29/2016:  Patient is still having difficulty walking. Still continues to have problems with memory. Physical therapy helped tremendously however she has declined since stopping. She cannot walk independently. She has difficulty lifting her right leg when standing but this may be due to left leg weakness. Discussed that due to the contracture of her left leg that Botox is not a possibility. I do think physical therapy as the possibility to improve this patient's mobility and I will refer her  Addendum: Patient may be a candidate for botox for spasticity. Pt's daughter called said [facility] OT has advised that botox could possibly help to give relief to a spastic tendon behind the left knee. She said Dr Fredderick Phenix [facility] said if that could be done asap so it does not get worse.  Recommend evaluation with Dr. Krista Blue.  Interval History 10/02/2015: Patient and her daughter are here for follow-up today and to review extensive history and documentation that I received from [facility]. Personally reviewed over 200 pages of documents as summarized below. Patient does state she has a history of Afib and at one point her cardiologist wanted her to be placed in Eliquis. Patient says she took a request for a short period of time and then stopped for unknown reason. It does not appear she understood why she was on the Eliquis as she has memory problems but did state "afib" and "Eliquis".   Patient presented on 05/26/2015. Documents state the patient does have a history of atrial fibrillation. Also a history of hypertension, peripheral neuropathy, congestive heart failure with preserved ejection fraction, moderate aortic stenosis, moderate mitral and tricuspid insufficiency, atrial fibrillation, venous  insufficiency.  anxiety, hyperlipidemia, peripheral vascular disease who presented to the emergency room with complaints of dizziness ongoing for 2 days. She also reported an unsteady gait. She was previously treated for pneumonia 2 months prior to presenting for dizziness. In the emergency room she had some audible wheezes, her O2 sat on ABG was 89% with pneumonia, and she had bradycardia into the 30s and acute renal failure. She had been previously treated with Bactrim for the last 5 days. Her blood pressure was borderline hypotensive and she was also found to be hyponatremic. Patient was admitted into the hospital. CT scan on admission showed an area of low attenuation involving the territory of the left PCA.  At no point in the documentation does say that patient was on blood thinner at the time of this admission.   On April 4 in the evening patient developed acute left-sided weakness and facial droop also in A. fib heart rate in the 130s. Repeat CT of the head negative for acute bleed and NIH stroke scale 13. and TPA was ordered. She was started on meds per cardiology for A. fib, CHF, left lower extremity edema with some movement, mentation declined.  MRI MRA of the [place], carotid ultrasound, echo with bubble were ordered.  MRI of the [place] with and without contrast on 05/28/2015 showed supratentorial and infratentorial volume loss, diffusion-weighted images showed abnormal restricted diffusion in the posterior right frontal lobe, most of the right temporal lobe, throughout most of the right parietal lobe, and in the lateral aspect of the right basal ganglia, consistent with a large right middle cerebral artery distribution subacute or acute cerebrovascular accident. The cytotoxic edema in  the subacute/acute right middle cerebral artery distribution is producing mild mass effect in the right cerebral hemisphere and mild mass effect on the lateral aspect of the right lateral ventricle. There is a  moderate degree of chronic white matter small vessel ischemia. Also seen are 2 enhancing areas in the diploic space of the calvarium. There is one in the left frontal bone measuring 2.3 cm in the greatest dimension. There is one in the right frontal bone measuring 0.8 cm in greatest dimension. These enhancing space-occupying lesions in the diploic space the left frontal bone and in the right frontal bone may conceivably represent metastatic lesions.  MRA of the head without contrast: Anterior circulation:   Right internal carotid artery: There is superficial plaque formation in the right cavernous carotid artery in the cavernous portion of the right internal carotid artery.  The right middle cerebral artery is not seen consistent with an occluded right middle cerebral artery from the origin of the right middle cerebral artery at the level of the right supraclinoid internal carotid artery.  Right anterior cerebral artery: Patent, no focal stenosis, segmental occlusion or aneurysm.  Left cavernous carotid artery: Superficial plaque formation is seen in the cavernous portion of the left internal carotid artery.  Left middle cerebral artery: Patent, no focal stenosis, segmental occlusion or aneurysm.  Left anterior cerebral artery: Patent, no focal stenosis, segmental occlusion or aneurysm.  Posterior circulation: Right vertebral artery: Patent, no focal stenosis, segmental occlusion or aneurysm.  Left vertebral artery: The distal left cerebral artery is not seen.  Basilar artery: Superficial plaque formation is seen throughout the basilar artery.  Right posterior cerebral artery: Superficial plaque formation is seen throughout the P1 and P2 segments of the right posterior cerebral artery.  Left posterior cerebral artery: Superficial plaque formation is seen throughout the P1, P2 and P3 segments of the left posterior cerebral artery.  Ultrasound bilateral carotids: Over 70%  diameter stenosis in the proximal left internal carotid artery. There is less than 40% diameter stenosis in the proximal right internal carotid artery.  Ultrasound venous lower extremity bilateral: No evidence of deep vein thrombosis in the right or left lower extremity.  Repeat CT of the head the late evening of 05/28/2015 showed an evolving large right MCA distribution infarct with associated mass effect effacing the overlying cortical sulci. 5 mm leftward midline shift. Tiny hyperdensity within the left periventricular white matter most compatible with small intraparenchymal hemorrhage. Evolving acute right MCA distribution infarct with associated mass effect resulting in 5 mm left midline shift slightly increased than on the prior MRI performed 8 hours ago. Tiny focus of intraparenchymal hemorrhage in the left frontal subcortical white which was evident on the prior MRI examination but is new from the prior CT examination.  MRA of the neck with and without contrast:   Arch: The origin of the great vessels are not well seen in this examination. There may be hemodynamically significant stenosis at the origin of the left common carotid artery and at the origin of the left subclavian artery.  Right [place] carotid artery: No significant stenosis Internal carotid artery: There is an approximately 50% diameter stenosis in the proximal right internal carotid artery. External carotid artery: No significant stenosis.  Left Common carotid artery: No significant stenosis. Internal carotid artery: There is an approximately 50% diameter stenosis in the origin of the left internal carotid artery. Nevertheless in view of what was seen in the bilateral carotid ultrasound performed today probably the degree of stenosis in  the proximal left internal carotid artery is greater than 50%. CTA is recommended. External carotid artery: No significant stenosis.  Repeat CT of the head with and without contrast  05/29/2015:  Large territory infarct with extensive edema involving the right frontal parietal temporal lobes with mild mass effect and minimal ((less than 5 mm) right-to-left midline shift. Hyperdensity in the left centrum semiovale most likely representing a small acute petechial hemorrhage. There is mild enhancement in the infarcted Fifer parenchyma, likely related to post infarction perfusion. There is no definite evidence of an enhancing mass. There is calvarial lucencies in the frontal bones, which correlates with the enhancing lesions noted on the MRI of the [place]. Metastasis cannot be excluded.  CT of the thorax/abdomen/pelvis: The study is moderately suboptimal due to extensive respiratory motion.  Consolidative changes in the lower lobes bilaterally, which may be related to atelectasis. Correlate to exclude pneumonia or aspiration.  Large pleural effusions bilaterally.  9 mm indeterminate nodule in the left upper lobe. Consider PET CT to exclude malignancy.  Atherosclerosis and cardiomegaly.  Gallbladder contains a large stone. There is wall thickening. Acute or chronic cholecystitis cannot be excluded. Correlate with clinical findings. Consider a nuclear medicine hepatobiliary scan with calculation of gallbladder ejection fraction.  Small to moderate amount of abdominal and pelvic ascites. A nonspecific but abnormal finding. Correlate with clinical findings to exclude peritoneal infectious or inflammatory process.  Diverticulosis coli without definitive evidence of acute diverticulitis.  Left renal cyst.  Severe degenerative changes in the spine with kyphosis and malalignment. There is probable multilevel spinal stenosis.  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 No liposarcoma cannot be excluded with certainty. This may be further evaluated by contrast-enhanced MRI with and without contrast.  Ultrasound arterial  lower right: ABIs suggesting moderate arterial occlusive disease.  MRA of the lower extremity with without contrast: Focal ulcerated plaque versus pseudoaneurysm involving the ventral margin of the distal right common iliac artery small aneurysm sac measuring up to 6 mm.  Severe origin stenosis of the right external iliac artery with a more moderate segmental tandem stenosis in the midportion.  No significant left iliac inflow disease.  Metallic stent extending from the left common femoral artery into the left SFA with additional overlapping metallic stents distally extending into the popliteal artery. Stents appear grossly patent but degree of stent restenosis cannot be ascertained on this examination.  Suboptimal evaluation of the left tibial arteries due to extensive venous contamination of the tibial perineal trunk vessels. Anterior tibial artery demonstrates severe segmental stenosis proximally but distal reconstitution of patency to the dorsalis pedis level.  Stent artifact diffusely involving the right SFA majority of the popliteal artery. Stents appear to grossly maintain some degree of patency.  Severe segmental stenosis of the proximal tibioperoneal trunk.  Aberrant origin of the right anterior tibial artery at the level of the knee articulation with proximal patency but occlusion of the proximal Without distal reconstitution.  Right posterior tibial artery is overall adequately patent with any moderate short segmental stenosis in the midportion.  Peroneal artery demonstrates proximal patency with occlusion above the ankle.     Nuclear medicine bone scan total body was performed. Only one solitary focus of abnormal bone scan activity in the inferior lateral right frontal bone or possibly sphenoid bone. Difficult bone scan evaluation. This does not is necessarily prove that the calvarial lesions are benign, lytic lesions could be normal on conventional nuclear bone  scan. No osteoblastic skeletal  metastases elsewhere in the axial and appendicular skeleton. Performed 05/31/2015.   Patient was discharged on aspirin 81 mg, simvastatin 80, carvedilol and digitoxin, diltiazem, doxazosin, famotidine, gabapentin, losartan and pentoxifylline. She was discharged to subacute nursing facility for continued physical therapy.  TGY:BWLSLHT Brainis a 83 y.o.femalehere as a referral for remote CVA. PMHx CVA, HTN, Left hemiparesis, A-fib, CAD, PN and PVD. On ASA 81mg  sequelae from stroke includes left hemiparesis, contractures, left foot drop, weakness. Stroke was 2 months ago, went to the ED in [facility], for dizziness, She was admitted and then had a stoke that evening. She was given TPA. She moved here to a nursing facility to be close to her daughters. She was in the hospital for 2 weeks in serious condition. She couldn't move anything on the left but the left side is improving. She also had a facial droop. I don't have any details from her hospitalization, no details on the stroke but will request all records. She is in therapy. She can't walk, she can stand with assistance. No swallowing problems anymore, no aphasia or dysarthria. She forgets there are things on the left, possibly some neglect. No new sensory changes with the stroke. She has had terrible neuropathy in the feet for many years unclear diagnosis or why. She has been having memory problems more short-term memory and reasoning since the stroke. She is incontinent but this is not new. She used to be on Eliquis, She is unsure when she stopped it or when or why she was on it or when she was diagnosed with afib. Need records. Daughter is here with her and provides most information. Daughter went to the [employer] enrichment and then lived in the [place] area but now he is hearing [facility] and patient is here to be near her daughter. Her neuropathy is most painful at night while she is in bed.  Reviewed notes, labs and  imaging from outside physicians, which showed: Reviewed records provided by nursing facility. Patient is on aspirin 81 mg, no anticoagulation is being given to her for her atrial fibrillation. She is on gabapentin 600 mg for neuropathy. Patient has hemiplegia and hemiparesis following unspecified cerebrovascular disease affecting left nondominant side. She was hospitalized for acute CVA given TPA. Has left hemiparesis, contracture management, left foot drop, needs rehabilitation for strengthening mobility, monitor for pain and muscle spasms, on ASA 81 mg by mouth daily.   Review of Systems: Patient complains of symptoms per HPI as well as the following symptoms: Cold intolerance, incontinence of bladder, joint pain, walking difficulty, itching. Pertinent negatives per HPI. All others negative.    Social History   Social History  . Marital status: Widowed    Spouse name: N/A  . Number of children: 4  . Years of education: 12   Occupational History  . Retired    Social History Main Topics  . Smoking status: Never Smoker  . Smokeless tobacco: Never Used  . Alcohol use No  . Drug use: No  . Sexual activity: Not on file   Other Topics Concern  . Not on file   Social History Narrative   Lives at [facility]   Caffeine use: 1 cup tea/day   1 cup coffee/day    Family History  Problem Relation Age of Onset  . Heart attack Brother   . Stroke Neg Hx   . Neuropathy Neg Hx     Past Medical History:  Diagnosis Date  . Anxiety   . Fall   .  GERD (gastroesophageal reflux disease)   . Heart failure ([place])   . HLD (hyperlipidemia)   . HTN (hypertension)   . Hyperlipidemia   . Hypotension   . Major depressive disorder   . Neuropathy   . PVD (peripheral vascular disease) ([place])   . Stroke [facility])    left sided deficit  . UTI (urinary tract infection)     Past Surgical History:  Procedure Laterality Date  . ABDOMINAL HYSTERECTOMY    . FOOT NEUROMA SURGERY    . SPINE  SURGERY    . TONSILLECTOMY      Current Outpatient Prescriptions  Medication Sig Dispense Refill  . acetaminophen (TYLENOL) 500 MG tablet Take 500 mg by mouth 4 (four) times daily. Left knee    . amiodarone (PACERONE) 200 MG tablet Take 1 tablet by mouth twice daily for 10 days. Starting on 05/20/2016, only take 1 tablet once daily. (Patient taking differently: Take 200 mg by mouth 2 (two) times daily. Take 1 tablet by mouth twice daily for 10 days. Starting on 05/20/2016, only take 1 tablet once daily.) 40 tablet 5  . apixaban (ELIQUIS) 2.5 MG TABS tablet Take 1 tablet (2.5 mg total) by mouth 2 (two) times daily. 60 tablet 2  . diphenhydrAMINE (BENADRYL) 2 % cream Apply 1 application topically at bedtime.    . gabapentin (NEURONTIN) 300 MG capsule Take 300-600 mg by mouth See admin instructions. Pt takes 300mg  daily, takes 600mg  at bedtime    . hydrALAZINE (APRESOLINE) 10 MG tablet Take 10 mg by mouth 2 (two) times daily.    Marland Kitchen lisinopril (PRINIVIL,ZESTRIL) 40 MG tablet Take 1 tablet (40 mg total) by mouth daily. 30 tablet 11  . loperamide (IMODIUM A-D) 2 MG tablet Take 2 mg by mouth every 8 (eight) hours as needed for diarrhea or loose stools.    . meclizine (ANTIVERT) 12.5 MG tablet Take 12.5 mg by mouth every 8 (eight) hours as needed for dizziness.    . Menthol, Topical Analgesic, (BIOFREEZE) 4 % GEL Apply 1 application topically 4 (four) times daily. Left knee for pain     . Menthol-Zinc Oxide (CALMOSEPTINE) 0.44-20.6 % OINT Apply 1 application topically at bedtime as needed (for irritation).    . sertraline (ZOLOFT) 25 MG tablet Take 25 mg by mouth daily.    . traMADol (ULTRAM) 50 MG tablet Take 25 mg by mouth every 8 (eight) hours as needed for moderate pain.     No current facility-administered medications for this visit.     Allergies as of 06/29/2016  . (No Known Allergies)    Vitals: BP (!) 105/48   Pulse (!) 51   Ht 5' (1.524 m)   Wt 119 lb (54 kg)   BMI 23.24 kg/m  Last  Weight:  Wt Readings from Last 1 Encounters:  06/29/16 119 lb (54 kg)   Last Height:   Ht Readings from Last 1 Encounters:  06/29/16 5' (1.524 m)    Physical exam: Exam: Gen: NAD, conversant, well nourised  CV: RRR, no MRG. No Carotid Bruits. No peripheral edema, warm, nontender Eyes: Conjunctivae clear without exudates or hemorrhage  Neuro: Detailed Neurologic Exam  Speech: Speech is normal; fluent and spontaneous with normal comprehension.  Cognition: The patient is oriented to person, place, and time;  recent and remote memory Impaired;  language fluent;  Impaired attention, concentration,  fund of knowledge impaired Cranial Nerves: The pupils are equal, round, and reactive to light. Attempted funduscopic exam could not visualize due  to small pupils. Visual fields are full to finger confrontation. Extraocular movements are intact. Trigeminal sensation is impaired on the left and the muscles of mastication are normal. Lower left facial weakness. The palate elevates in the midline. Hearing impaired. Voice is normal. Shoulder shrug is normal. The tongue has normal motion without fasciculations.   Coordination: Impaired with the left arm and left leg. Intact on the right. Dec fine motor on the left esp left hand.   Gait: Can stand briefly with assistance.   Motor Observation: No asymmetry, no atrophy, and no involuntary movements noted. Tone: Normal muscle tone. No spasticity  Posture: Posture stopped in the wheelachair.   Strength: Left pronator drift. Left hemi[paresiss that is not too different from the right side. Right is 4-4+/4 and the left side is 3+-4/5.  Sensation: Left hemiparesis and significant distal dec in sensation due to polyneuropathy.   Reflex Exam:  DTR's: Deep tendon reflexes in the upper and lower extremities are brisk for age and medical conditions, left greater than the right .   Toes: The toes are downgoing bilaterally.  Clonus: Clonus is absent.     Assessment/Plan:83 y.o. female here as a referral for remote CVA. PMHx CVA, HTN, Left hemiparesis, A-fib, CAD, PN and PVD, congestive heart failure with preserved ejection fraction, moderate aortic stenosis, moderate mitral and tricuspid insufficiency, venous insufficiency, anxiety, hyperlipidemia. On ASA 81mg , sequelae from stroke includes left hemiparesis, contractures, left foot drop, weakness. Stroke was 2 months ago (please see extensive review of medical documentation under  history present illness). Patient with a large right MCA ischemic stroke secondary to atrial fibrillation administered TPA in April in [place]. At some point patient states she was on eloquis but stopped it for some reason, there appeared to be memory deficits and possibly dementia.   Large right MCA stroke secondary to atrial fibrillation: - History of atrial fibrillation on eliquis at some point, unclear why that was stopped, patient had acute onset left hemiplegia in April administered TPA subsequent large right MCA stroke secondary to atrial fibrillation.  - restarted on Eliquis - MRI of the [place] also showed supratentorial and infratentorial volume loss with chronic advanced micro-vascular changes.   - She declines imaging of the lumbar spine and cervical spine and prefers just physical therapy at this time to help increase mobility.   Follow up:  - Patient has a history of atrial fibrillation, congestive heart failure, needs follow-up with cardiology. - Also seen were 2 enhancing areas in the diploic space of the calvarium, whole body PET scan was negative as well as an CT. Patient likely needs to be followed up in hematology and oncology for further workup. - Patient and daughter would like a consult from a vascular/stroke neurologist. Referred her to one of my colleagues for a one-time consult.  - Gait abnormality,  stroke, difficulty transferring, weakness,  anything we can do to help improve mobility  Orders Placed This Encounter  Procedures  . Ambulatory referral to Physical Therapy    Cc: Dr. Virl Son, MD  [facility] [address] [place], [place] [address]  Phone [phone] Fax [phone]  A total of 15 minutes was spent face-to-face with this patient. Over half this time was spent on counseling patient on the Spastic hemiparesis diagnosis and different diagnostic and therapeutic options available.

## 2016-07-06 ENCOUNTER — Ambulatory Visit (INDEPENDENT_AMBULATORY_CARE_PROVIDER_SITE_OTHER): Payer: Medicare HMO | Admitting: Cardiovascular Disease

## 2016-07-06 ENCOUNTER — Encounter: Payer: Self-pay | Admitting: Cardiovascular Disease

## 2016-07-06 ENCOUNTER — Ambulatory Visit (HOSPITAL_COMMUNITY)
Admission: RE | Admit: 2016-07-06 | Discharge: 2016-07-06 | Disposition: A | Discharge status: Home / Self Care | Payer: Medicare HMO | Source: Ambulatory Visit | Attending: Cardiology | Admitting: Cardiology

## 2016-07-06 DIAGNOSIS — R0989 Other specified symptoms and signs involving the circulatory and respiratory systems: Secondary | ICD-10-CM | POA: Diagnosis not present

## 2016-07-06 DIAGNOSIS — I739 Peripheral vascular disease, unspecified: Secondary | ICD-10-CM

## 2016-07-06 DIAGNOSIS — I6523 Occlusion and stenosis of bilateral carotid arteries: Secondary | ICD-10-CM | POA: Insufficient documentation

## 2016-07-06 DIAGNOSIS — I480 Paroxysmal atrial fibrillation: Secondary | ICD-10-CM

## 2016-07-06 DIAGNOSIS — I442 Atrioventricular block, complete: Secondary | ICD-10-CM

## 2016-07-06 DIAGNOSIS — I10 Essential (primary) hypertension: Secondary | ICD-10-CM | POA: Diagnosis not present

## 2016-07-06 DIAGNOSIS — E785 Hyperlipidemia, unspecified: Secondary | ICD-10-CM

## 2016-07-06 DIAGNOSIS — I779 Disorder of arteries and arterioles, unspecified: Secondary | ICD-10-CM

## 2016-07-06 NOTE — Assessment & Plan Note (Signed)
History of essential hypertension blood pressure measured today at 195/76. She is on lisinopril which she apparently did not receive this morning.

## 2016-07-06 NOTE — Patient Instructions (Signed)
Medication Instructions: Your physician recommends that you continue on your current medications as directed. Please refer to the Current Medication list given to you today.   Follow-Up: We request that you follow-up in: 6 months with Hao Meng, PA and in 12 months with Dr Berry  You will receive a reminder letter in the mail two months in advance. If you don't receive a letter, please call our office to schedule the follow-up appointment.  If you need a refill on your cardiac medications before your next appointment, please call your pharmacy.  

## 2016-07-06 NOTE — Progress Notes (Signed)
07/06/2016 Virginia Meyer   [birth year]  [id number]  Primary Physician Reymundo Poll, MD Primary Cardiologist: Lorretta Harp MD Renae Gloss  HPI:  Virginia Meyer is an 83 year old thin and frail-appearing Caucasian female accompanied by her daughter [name] today. She relocated from [place] to [place]. She does have a history of hypertension, prior strokes and PAF on Eliquis  oral anticoagulation. When she presented to the emergency room 05/07/16 she was hypotensive and nonresponsive. She didn't appear to be in complete heart block with a heart rate of 30 and hypotensive. She was treated with IV glucagon which were resulted in some improvement. She ultimately regained consciousness. A 2-D echo was normal. Her negative chronotropic medications were discontinued. She was begun on amiodarone which successfully converted her from a flutter to sinus rhythm. She currently lives in [facility] assisted care facility and is somewhat wheelchair bound. She denies chest pain or shortness of breath.   Current Outpatient Prescriptions  Medication Sig Dispense Refill  . acetaminophen (TYLENOL) 500 MG tablet Take 500 mg by mouth 4 (four) times daily. Left knee    . amiodarone (PACERONE) 200 MG tablet Take 1 tablet by mouth twice daily for 10 days. Starting on 05/20/2016, only take 1 tablet once daily. (Patient taking differently: Take 200 mg by mouth 2 (two) times daily. Take 1 tablet by mouth twice daily for 10 days. Starting on 05/20/2016, only take 1 tablet once daily.) 40 tablet 5  . apixaban (ELIQUIS) 2.5 MG TABS tablet Take 1 tablet (2.5 mg total) by mouth 2 (two) times daily. 60 tablet 2  . diphenhydrAMINE (BENADRYL) 2 % cream Apply 1 application topically at bedtime.    . gabapentin (NEURONTIN) 300 MG capsule Take 300-600 mg by mouth See admin instructions. Pt takes 300mg  daily, takes 600mg  at bedtime    . hydrALAZINE (APRESOLINE) 10 MG tablet Take 10 mg by mouth 2 (two) times daily.    Marland Kitchen  lisinopril (PRINIVIL,ZESTRIL) 40 MG tablet Take 1 tablet (40 mg total) by mouth daily. 30 tablet 11  . loperamide (IMODIUM A-D) 2 MG tablet Take 2 mg by mouth every 8 (eight) hours as needed for diarrhea or loose stools.    . meclizine (ANTIVERT) 12.5 MG tablet Take 12.5 mg by mouth every 8 (eight) hours as needed for dizziness.    . Menthol, Topical Analgesic, (BIOFREEZE) 4 % GEL Apply 1 application topically 4 (four) times daily. Left knee for pain     . Menthol-Zinc Oxide (CALMOSEPTINE) 0.44-20.6 % OINT Apply 1 application topically at bedtime as needed (for irritation).    . sertraline (ZOLOFT) 25 MG tablet Take 25 mg by mouth daily.    . traMADol (ULTRAM) 50 MG tablet Take 25 mg by mouth every 8 (eight) hours as needed for moderate pain.     No current facility-administered medications for this visit.     No Known Allergies  Social History   Social History  . Marital status: Widowed    Spouse name: N/A  . Number of children: 4  . Years of education: 12   Occupational History  . Retired    Social History Main Topics  . Smoking status: Never Smoker  . Smokeless tobacco: Never Used  . Alcohol use No  . Drug use: No  . Sexual activity: Not on file   Other Topics Concern  . Not on file   Social History Narrative   Lives at [facility]   Caffeine use: 1 cup tea/day  1 cup coffee/day     Review of Systems: General: negative for chills, fever, night sweats or weight changes.  Cardiovascular: negative for chest pain, dyspnea on exertion, edema, orthopnea, palpitations, paroxysmal nocturnal dyspnea or shortness of breath Dermatological: negative for rash Respiratory: negative for cough or wheezing Urologic: negative for hematuria Abdominal: negative for nausea, vomiting, diarrhea, bright red blood per rectum, melena, or hematemesis Neurologic: negative for visual changes, syncope, or dizziness All other systems reviewed and are otherwise negative except as noted  above.    Blood pressure (!) 195/76, pulse 69, height 5' (1.524 m), weight 119 lb (54 kg), SpO2 96 %.  General appearance: alert and no distress Neck: no adenopathy, no JVD, supple, symmetrical, trachea midline, thyroid not enlarged, symmetric, no tenderness/mass/nodules and Soft bilateral carotid bruits Lungs: no adenopathy, no JVD, supple, symmetrical, trachea midline, thyroid not enlarged, symmetric, no tenderness/mass/nodules and Soft bilateral carotid bruits Heart: Soft outflow tract murmur Extremities: extremities normal, atraumatic, no cyanosis or edema  EKG not performed today  ASSESSMENT AND PLAN:   Hyperlipidemia LDL goal <70 History of hyperlipidemia not on statin therapy followed by her PCP  Paroxysmal atrial fibrillation (HCC) History of PAF currently in sinus rhythm clinically on a Eliquis and amiodarone  Benign essential HTN History of essential hypertension blood pressure measured today at 195/76. She is on lisinopril which she apparently did not receive this morning.  Complete heart block (HCC) History of complete heart block during her hospitalization in March. She was on Cardizem and digoxin at that time with which were discontinued. She did go into a flutter with a rapid response which converted to sinus rhythm on amiodarone.  Carotid artery disease (HCC) History of bilateral internal carotid artery stenosis with recent Dopplers that showed mild right and moderate left ICA stenosis.      Lorretta Harp MD FACP,FACC,FAHA, [facility] 07/06/2016 11:48 AM

## 2016-07-06 NOTE — Assessment & Plan Note (Signed)
History of hyperlipidemia not on statin therapy followed by her PCP 

## 2016-07-06 NOTE — Assessment & Plan Note (Signed)
History of complete heart block during her hospitalization in March. She was on Cardizem and digoxin at that time with which were discontinued. She did go into a flutter with a rapid response which converted to sinus rhythm on amiodarone.

## 2016-07-06 NOTE — Assessment & Plan Note (Signed)
History of bilateral internal carotid artery stenosis with recent Dopplers that showed mild right and moderate left ICA stenosis.

## 2016-07-06 NOTE — Assessment & Plan Note (Signed)
History of PAF currently in sinus rhythm clinically on a Eliquis and amiodarone

## 2016-07-13 ENCOUNTER — Ambulatory Visit: Payer: Medicare HMO

## 2016-07-20 ENCOUNTER — Ambulatory Visit: Payer: Medicare HMO | Attending: Neurology | Admitting: Physical Therapy

## 2016-07-20 ENCOUNTER — Encounter: Payer: Self-pay | Admitting: Physical Therapy

## 2016-07-20 DIAGNOSIS — M6281 Muscle weakness (generalized): Secondary | ICD-10-CM | POA: Diagnosis present

## 2016-07-20 DIAGNOSIS — I69354 Hemiplegia and hemiparesis following cerebral infarction affecting left non-dominant side: Secondary | ICD-10-CM | POA: Insufficient documentation

## 2016-07-20 DIAGNOSIS — R29898 Other symptoms and signs involving the musculoskeletal system: Secondary | ICD-10-CM | POA: Diagnosis present

## 2016-07-20 DIAGNOSIS — R2681 Unsteadiness on feet: Secondary | ICD-10-CM

## 2016-07-20 DIAGNOSIS — M21372 Foot drop, left foot: Secondary | ICD-10-CM

## 2016-07-20 NOTE — Therapy (Signed)
[facility] [address] [place], [place], [address] Phone: [phone]   Fax:  [phone]  Physical Therapy Evaluation  Patient Details  Name: Virginia Meyer MRN: [id number] Date of Birth: [birth year] Referring Provider: Sarina Ill, MD  Encounter Date: 07/20/2016      PT End of Session - 07/20/16 2034    Visit Number 1   Number of Visits 17   Date for PT Re-Evaluation 09/17/16   Authorization Type MCR; G-Code   Authorization Time Period 07/20/16 to 09/17/16   PT Start Time 1314   PT Stop Time 1355   PT Time Calculation (min) 41 min   Equipment Utilized During Treatment Gait belt   Activity Tolerance Patient tolerated treatment well   Behavior During Therapy [facility] for tasks assessed/performed      Past Medical History:  Diagnosis Date  . Anxiety   . Fall   . GERD (gastroesophageal reflux disease)   . Heart failure ([place])   . HLD (hyperlipidemia)   . HTN (hypertension)   . Hyperlipidemia   . Hypotension   . Major depressive disorder   . Neuropathy   . PVD (peripheral vascular disease) ([place])   . Stroke [facility]    left sided deficit  . UTI (urinary tract infection)     Past Surgical History:  Procedure Laterality Date  . ABDOMINAL HYSTERECTOMY    . FOOT NEUROMA SURGERY    . SPINE SURGERY    . TONSILLECTOMY      There were no vitals filed for this visit.       Subjective Assessment - 07/20/16 1957    Subjective Patient reports she wants to learn to stand and walk again. Reports she has been non-ambulatory since her stroke 05/27/15. States she had one good month of therapy when she first moved to [facility] (daughter reports she was standing with RW for several minutes at that time). She then moved to an ALF [facility]) and has had very little therapy since she moved. Reports the facility has a therapy room, however all they have is a mat table and no other equipment. She is interested in seeing  how much she can progress if given more intensive therapy.    Patient is accompained by: Family member  daughter   Pertinent History hospitalized with [facility] 04/2016, afib, HTN, peripheral neuropathy, CHF, PVD, 05/27/15 large R MCA CVA, lt foot drop due to back sx 2015   Limitations Standing;Walking   How long can you stand comfortably? unable   How long can you walk comfortably? unable; self-propels her w/c with bil LEs and RUE   Patient Stated Goals she wants to learn to stand and walk again.   Currently in Pain? No/denies            [facility] PT Assessment - 07/20/16 0001      Assessment   Medical Diagnosis Rt MCA CVA    Referring Provider Sarina Ill, MD   Onset Date/Surgical Date 05/27/15   Hand Dominance Right   Prior Therapy 1 month at SNF; several months at [facility] Wheelchair - manual   Additional Comments reports she has a private aide for 3 hrs each morning and she can assist her with doing her HEP     Prior Function   Level of Independence Needs assistance with  ADLs;Needs assistance with transfers   Leisure crafts (crochet, make jewelry)   Comments reports one person assist to transfer in/out of w/c "they just pick me up under my arms"     Cognition   Overall Cognitive Status Within Functional Limits for tasks assessed  followed commands briskly; decr memory related to dates/time   Memory Impaired   Memory Impairment --  decr recall of specifics re: therapies since 05/2015     Observation/Other Assessments   Observations pushed in w/c by dtr from lobby to exam room; pt holding bil feet in the air due to no foot rests on w/c   Focus on Therapeutic Outcomes (FOTO)  FS 12 (adjusted 40)   Functional Activities Questionnaire (FAQ)  2.8% Physical Mobility   Fear Avoidance Belief Questionnaire (FABQ)  26     Sensation   Light Touch Impaired by gross  assessment   Light Touch Impaired Details Impaired LLE;Impaired RLE   Additional Comments decr light touch bil LEs due to neuropathy; LLE with tingling      Coordination   Gross Motor Movements are Fluid and Coordinated No   Fine Motor Movements are Fluid and Coordinated No   Coordination and Movement Description bil LEs limited by arthritis and weakness, difficult to accurately assess coordination     Posture/Postural Control   Posture/Postural Control Postural limitations   Postural Limitations Rounded Shoulders;Forward head;Increased thoracic kyphosis;Posterior pelvic tilt;Weight shift right;Right pelvic obliquity   Posture Comments rt pelvis anterior to left; leans to her right     ROM / Strength   AROM / PROM / Strength AROM;PROM;Strength     AROM   Overall AROM  Deficits  UEs grossly WFL; rt shoulder flexion<Lt   Overall AROM Comments ROM deficits bil LEs estimated except for Lt knee extension measured in supine   AROM Assessment Site Hip;Knee;Ankle   Right/Left Hip Right;Left   Right Hip Extension -30   Left Hip Extension -20   Right/Left Knee Right;Left   Right Knee Extension -30   Left Knee Extension -48     Strength   Overall Strength Deficits   Overall Strength Comments bil UE biceps/triceps 4/5; bil weak grip   Strength Assessment Site Hip;Knee;Ankle   Right/Left Hip Right;Left   Right Hip Flexion 4/5   Right Hip ABduction 4/5   Left Hip Flexion 4/5   Left Hip ABduction 3+/5   Right/Left Knee Right;Left   Right Knee Flexion 4/5   Right Knee Extension 4/5  in available ROM   Left Knee Flexion 3+/5   Left Knee Extension 3+/5  in available ROM (flexion contracture)   Right/Left Ankle Right;Left   Right Ankle Dorsiflexion 4+/5   Left Ankle Dorsiflexion 0/5     Bed Mobility   Bed Mobility Rolling Right;Rolling Left;Left Sidelying to Sit;Sit to Supine  incr time/effort all mobility   Rolling Right 6: Modified independent (Device/Increase time)   Rolling Left  6: Modified independent (Device/Increase time)   Left Sidelying to Sit 6: Modified independent (Device/Increase time)   Sit to Supine 6: Modified independent (Device/Increase time)     Transfers   Transfers Sit to Stand;Squat Pivot Transfers;Stand Pivot Transfers   Sit to Stand 2: Max assist;With upper extremity assist;From bed;Multiple attempts  Unable to stand with RW, +1 assist   Stand Pivot Transfers 3: Mod assist   Stand Pivot Transfer Details (indicate cue type and reason) return to w/c going to pt's right; achieved as much upright standing as pt capable of due to  bil knee contractures   Squat Pivot Transfers 3: Mod assist;With armrests   Squat Pivot Transfer Details (indicate cue type and reason) to pt's lt w/c to mat; max cues for sequening and to use UEs to initiate push-off; pt tends to push/lean posteriorly however responded well to education to lean forward to incr ease     Ambulation/Gait   Ambulation/Gait No  unable     Wheelchair Mobility   Wheelchair Mobility Yes   Wheelchair Assistance 3: Mod assist   Wheelchair Assistance Details (indicate cue type and reason) in small room to turn chair around and move up to mat table   Wheelchair Propulsion Right upper extremity;Right lower extremity   Wheelchair Parts Management Supervision/cueing  uses bil brake extenders     Balance   Balance Assessed Yes     Static Sitting Balance   Static Sitting - Balance Support No upper extremity supported;Feet supported   Static Sitting - Level of Assistance 5: Stand by assistance   Static Sitting - Comment/# of Minutes up to 5 minutes; supervision for safety     Dynamic Sitting Balance   Dynamic Sitting - Balance Support Feet supported;No upper extremity supported   Dynamic Sitting - Level of Assistance 5: Stand by assistance   Reach (Patient is able to reach ___ inches to right, left, forward, back) 4 inches anteriorly            Objective measurements completed on  examination: See above findings.                  PT Education - 07/20/16 2032    Education provided Yes   Education Details results of evaluation; PT POC; unable to attempt ambulation as a goal until LLE contracture lessens; pt will have to work on stretching LLE for success   Person(s) Educated Patient;Child(ren)   Methods Explanation;Demonstration;Verbal cues   Comprehension Verbalized understanding;Need further instruction          PT Short Term Goals - 07/20/16 2055      PT SHORT TERM GOAL #1   Title Patient and caregiver/family will demonstrate independence with HEP for increasing ROM and strength for carryover into functional tasks. (TARGET for all STGs 08/20/16 **may need to be adjusted based on date of first treatment session available)   Time 4   Period Weeks   Status New     PT SHORT TERM GOAL #2   Title Patient will increase bil Knee extension ROM by 15 degrees to improve patient's ability to progress to standing balance activities.    Time 4   Period Weeks   Status New     PT SHORT TERM GOAL #3   Title Patient will transfer wheelchair to/from bed with no more than moderate assistance on 3 of 5 transfers with LRAD (as indicated).    Time 4   Period Weeks   Status New     PT SHORT TERM GOAL #4   Title Patient will demonstrate dynamic sitting balance by reaching >6 inches anteriorly while sitting on edge of mat table with supervision.    Time 4   Period Weeks   Status New     PT SHORT TERM GOAL #5   Title Patient will perform sit to stand in // bars with moderate assistance (pushing up from w/c, transitioning hands to //bars) and maintain standing x 90 seconds with minimal assistance.    Time 4   Period Weeks   Status New  PT Long Term Goals - 07/20/16 2110      PT LONG TERM GOAL #1   Title Patient and caregiver/family will demonstrate independence with updated HEP  (TARGET for all LTGs 09/17/16)   Time 8   Period Weeks   Status  New     PT LONG TERM GOAL #2   Title Patient will transfer from wheelchair to/from bed with minimal assistance and LRAD on 4 of 5 transfers.   Time 8   Period Weeks   Status New     PT LONG TERM GOAL #3   Title Patient will perform sit to stand with LRAD with minimal assistance and maintain standing x 90 seconds with minimal assistance for weight-shifting/pre-gait activities.    Time 8   Period Weeks   Status New     PT LONG TERM GOAL #4   Title Patient will demonstrate improved dynamic sitting balance by reaching to her foot/shoe while sitting on edge of mat table independently.   Time 8   Period Weeks   Status New                Plan - 07/20/16 2036    Clinical Impression Statement 83 yo presents to Physical Therapy with dependencies in mobility due to Rt MCA CVA 05/2015. Per patient and her daughter, she had PT at a SNF for ~1 month and then moved to an ALF where she received less PT (not even 3x/week per their report) and steadily declined. On recent physician appointment it was recommended she come to [facility]. Patient is motivated to participate, however with likely unrealistic goals for ambulation given the bil knee contractures she has acquired. Upon frank discussion that we would need to address ROM, strengthening, and basic transfers before considering goals for ambulation, she expressed understanding and a desire to increase her independence even at a wheelchair level. Patient will benefit from skilled PT including the interventions listed below to address the deficits listed below. Anticipate patient can make progress towards more independent transfers and use of her wheelchair.    History and Personal Factors relevant to plan of care: HTN, peripheral neuropathy, 05/27/15 large R MCA CVA, lt foot drop due to back sx 2015;     Clinical Presentation Stable   Clinical Presentation due to: history of deficits present >12 months   Clinical Decision Making Low   Rehab Potential  Fair   Clinical Impairments Affecting Rehab Potential duration >12 months since onset CVA; left foot drop from back surgery prior to CVA; bil LE contractures; need for assistance to carryover HEP and other mobility education   PT Frequency 2x / week   PT Duration 8 weeks   PT Treatment/Interventions ADLs/Self Care Home Management;DME Instruction;Gait training;Functional mobility training;Therapeutic activities;Therapeutic exercise;Balance training;Neuromuscular re-education;Cognitive remediation;Patient/family education;Orthotic Fit/Training;Wheelchair mobility training;Manual techniques;Moist Heat;Ultrasound;Passive range of motion;Splinting;Taping   PT Next Visit Plan educate on bil hamstring stretching for HEP (supine with heels floated, ? contract/relax, ?seated w/ propping LE on chair/stool); give bridging for LE strength; work on lateral ?sliding board transfers and wt-shifting forward over her feet   Consulted and Agree with Plan of Care Patient   Family Member Consulted daughter      Patient will benefit from skilled therapeutic intervention in order to improve the following deficits and impairments:  Decreased balance, Decreased coordination, Decreased knowledge of use of DME, Decreased mobility, Decreased range of motion, Decreased strength, Difficulty walking, Impaired flexibility, Impaired sensation, Impaired UE functional use, Postural dysfunction  Visit Diagnosis: Foot drop,  left - Plan: PT plan of care cert/re-cert  Hemiplegia and hemiparesis following cerebral infarction affecting left non-dominant side (HCC) - Plan: PT plan of care cert/re-cert  Muscle weakness (generalized) - Plan: PT plan of care cert/re-cert  Other symptoms and signs involving the musculoskeletal system - Plan: PT plan of care cert/re-cert  Unsteadiness on feet - Plan: PT plan of care cert/re-cert      G-Codes - 59/16/38 05-25-2121    Functional Assessment Tool Used (Outpatient Only) FIM levels of assistance:  max assistance sit to stand limited in part due to Lt knee flexion contracture of 48 degrees   Functional Limitation Mobility: Walking and moving around   Mobility: Walking and Moving Around Current Status [phone]) At least 80 percent but less than 100 percent impaired, limited or restricted   Mobility: Walking and Moving Around Goal Status [phone]) At least 40 percent but less than 60 percent impaired, limited or restricted       Problem List Patient Active Problem List   Diagnosis Date Noted  . Carotid artery disease ([place]) 07/06/2016  . Gait abnormality 05/13/2016  . Cerebrovascular accident (CVA) due to thrombosis of right anterior cerebral artery ([place]) 05/13/2016  . Complete heart block ([place]) 05/07/2016  . Benign essential HTN 08/21/2015  . Acute CVA (cerebrovascular accident) ([place]) 08/04/2015  . Left hemiparesis ([place]) 08/04/2015  . Primary osteoarthritis of left knee 08/04/2015  . Esophageal reflux 08/04/2015  . Hyperlipidemia LDL goal <70 08/04/2015  . Paroxysmal atrial fibrillation ([place]) 08/04/2015  . PVD (peripheral vascular disease) ([place]) 08/04/2015  . Protein-calorie malnutrition ([place]) 08/04/2015    Rexanne Mano, PT 07/20/2016, 9:29 PM  [facility] [address] [place], [place], [address] Phone: [phone]   Fax:  [phone]  Name: Virginia Meyer MRN: [id number] Date of Birth: 12/06/1928

## 2016-07-26 DIAGNOSIS — R451 Restlessness and agitation: Secondary | ICD-10-CM | POA: Diagnosis not present

## 2016-07-26 DIAGNOSIS — R69 Illness, unspecified: Secondary | ICD-10-CM | POA: Diagnosis not present

## 2016-07-26 DIAGNOSIS — G4700 Insomnia, unspecified: Secondary | ICD-10-CM | POA: Diagnosis not present

## 2016-07-26 DIAGNOSIS — F4322 Adjustment disorder with anxiety: Secondary | ICD-10-CM | POA: Diagnosis not present

## 2016-07-26 DIAGNOSIS — F329 Major depressive disorder, single episode, unspecified: Secondary | ICD-10-CM | POA: Diagnosis not present

## 2016-08-10 ENCOUNTER — Encounter: Payer: Self-pay | Admitting: Physical Therapy

## 2016-08-10 ENCOUNTER — Ambulatory Visit: Payer: Medicare HMO | Attending: Neurology | Admitting: Physical Therapy

## 2016-08-10 DIAGNOSIS — I69354 Hemiplegia and hemiparesis following cerebral infarction affecting left non-dominant side: Secondary | ICD-10-CM | POA: Diagnosis not present

## 2016-08-10 DIAGNOSIS — R2681 Unsteadiness on feet: Secondary | ICD-10-CM

## 2016-08-10 DIAGNOSIS — M6281 Muscle weakness (generalized): Secondary | ICD-10-CM

## 2016-08-10 DIAGNOSIS — R29898 Other symptoms and signs involving the musculoskeletal system: Secondary | ICD-10-CM | POA: Diagnosis not present

## 2016-08-10 NOTE — Patient Instructions (Addendum)
Bridge    Lie back, legs bent. Push through arms and legs to lift hips up. Hold for 5 seconds. 10 reps. __1-2__ sessions per day.  http://pm.exer.us/55   Copyright  VHI. All rights reserved    Heel Slide    Lie down with both knees bent. Slide right leg out to straighten knee as much as possible and then pull it back in. Perform 10 reps. Then switch to left leg and perform 10 reps.  http://gt2.exer.us/372   Copyright  VHI. All rights reserved.     Passive Acute Hamstring Stretch  While lying on your back place a bolster or 2-3 pillows under heel with knee straight to passively stretch knee and hamstring muscles.   Recommend maintaining stretch for 1-2 minutes. Progress up to 5 minutes as able. Do this one 2 times a day.     Hamstring Stretch Seated w/Stool  Sit both legs that you are stretching straight out with foot on stool. Keep this position for several minutes (while watching TV, etc) with breaks as needed. Do this throughout the day.

## 2016-08-11 NOTE — Therapy (Signed)
[facility] [address] [place], [place], [address] Phone: [phone]   Fax:  [phone]  Physical Therapy Treatment  Patient Details  Name: Virginia Meyer MRN: [id number] Date of Birth: 1928-06-11 Referring Provider: Sarina Ill, MD  Encounter Date: 08/10/2016      PT End of Session - 08/10/16 1656    Visit Number 2   Number of Visits 17   Date for PT Re-Evaluation 09/17/16   Authorization Type MCR; G-Code   Authorization Time Period 07/20/16 to 09/17/16   PT Start Time 1020   PT Stop Time 1100   PT Time Calculation (min) 40 min   Equipment Utilized During Treatment Gait belt   Activity Tolerance Patient tolerated treatment well   Behavior During Therapy [facility] for tasks assessed/performed      Past Medical History:  Diagnosis Date  . Anxiety   . Fall   . GERD (gastroesophageal reflux disease)   . Heart failure ([place])   . HLD (hyperlipidemia)   . HTN (hypertension)   . Hyperlipidemia   . Hypotension   . Major depressive disorder   . Neuropathy   . PVD (peripheral vascular disease) ([place])   . Stroke [facility])    left sided deficit  . UTI (urinary tract infection)     Past Surgical History:  Procedure Laterality Date  . ABDOMINAL HYSTERECTOMY    . FOOT NEUROMA SURGERY    . SPINE SURGERY    . TONSILLECTOMY      There were no vitals filed for this visit.     08/10/16 1024  Symptoms/Limitations  Subjective No new complaints. Reports she slid out of her bed last night to the floor when she bent over to pick something up and slid down. Caregiver "Chrissie Noa" picked her back up. Denies any injuries.   Patient is accompained by: Family member  Pertinent History hospitalized with CHB 04/2016, afib, HTN, peripheral neuropathy, CHF, PVD, 05/27/15 large R MCA CVA, lt foot drop due to back sx 2015  Limitations Standing;Walking  How long can you stand comfortably? unable  How long can you walk comfortably? unable;  self-propels her w/c with bil LEs and RUE  Patient Stated Goals she wants to learn to stand and walk again.  Pain Assessment  Currently in Pain? Yes  Pain Score 5  Pain Location Foot  Pain Orientation Right  Pain Descriptors / Indicators Pins and needles  Pain Type Neuropathic pain  Pain Onset More than a month ago  Pain Frequency Intermittent  Aggravating Factors  pressure  Pain Relieving Factors unsure         [facility] Adult PT Treatment/Exercise - 08/11/16 1659      Transfers   Transfers Sit to Stand;Stand to Sit;Lateral/Scoot Transfers;Stand Pivot Transfers   Sit to Stand 3: Mod assist;From bed;With upper extremity assist   Sit to Stand Details Verbal cues for sequencing;Verbal cues for technique;Manual facilitation for weight shifting;Manual facilitation for placement   Sit to Stand Details (indicate cue type and reason) x2 reps from low mat table with cues/facilitation for weight shifting and technique   Stand to Sit 3: Mod assist;With upper extremity assist;To bed;To chair/3-in-1   Stand to Sit Details (indicate cue type and reason) Verbal cues for sequencing;Verbal cues for technique;Manual facilitation for weight shifting;Manual facilitation for placement   Stand to Sit Details cues to reach back and use arms to control descent with sitting to both mat and chair.    Stand Pivot Transfers 3: Mod assist  Stand Pivot Transfer Details (indicate cue type and reason) from mat to pt's wheelchair with pt taking 2 steps. assist/facilitation for posture and weight shifting   Lateral/Scoot Transfers 4: Min assist;With slide board   Lateral/Scoot Transfer Details (indicate cue type and reason) slide boad used from wheelchair to mat table with cues on technique. assist/facilitation for weight shifting, hand placement and foot advancement/placement     Manual Therapy   Other Manual Therapy contract [employer]     issued to HEP today: Bridge    Lie back, legs bent. Push through  arms and legs to lift hips up. Hold for 5 seconds. 10 reps. __1-2__ sessions per day.  http://pm.exer.us/55   Copyright  VHI. All rights reserved    Heel Slide    Lie down with both knees bent. Slide right leg out to straighten knee as much as possible and then pull it back in. Perform 10 reps. Then switch to left leg and perform 10 reps.  http://gt2.exer.us/372   Copyright  VHI. All rights reserved.     Passive Acute Hamstring Stretch  While lying on your back place a bolster or 2-3 pillows under heel with knee straight to passively stretch knee and hamstring muscles.   Recommend maintaining stretch for 1-2 minutes. Progress up to 5 minutes as able. Do this one 2 times a day.     Hamstring Stretch Seated w/Stool  Sit both legs that you are stretching straight out with foot on stool. Keep this position for several minutes (while watching TV, etc) with breaks as needed. Do this throughout the day.     08/10/16 2310  PT Education  Education provided Yes  Education Details HEP for stretching and LE strengthening  Person(s) Educated Patient  Methods Explanation;Demonstration;Verbal cues;Handout  Comprehension Verbalized understanding;Returned demonstration;Verbal cues required         PT Short Term Goals - 07/20/16 2055      PT SHORT TERM GOAL #1   Title Patient and caregiver/family will demonstrate independence with HEP for increasing ROM and strength for carryover into functional tasks. (TARGET for all STGs 08/20/16 **may need to be adjusted based on date of first treatment session available)   Time 4   Period Weeks   Status New     PT SHORT TERM GOAL #2   Title Patient will increase bil Knee extension ROM by 15 degrees to improve patient's ability to progress to standing balance activities.    Time 4   Period Weeks   Status New     PT SHORT TERM GOAL #3   Title Patient will transfer wheelchair to/from bed with no more than moderate assistance on 3 of 5  transfers with LRAD (as indicated).    Time 4   Period Weeks   Status New     PT SHORT TERM GOAL #4   Title Patient will demonstrate dynamic sitting balance by reaching >6 inches anteriorly while sitting on edge of mat table with supervision.    Time 4   Period Weeks   Status New     PT SHORT TERM GOAL #5   Title Patient will perform sit to stand in // bars with moderate assistance (pushing up from w/c, transitioning hands to //bars) and maintain standing x 90 seconds with minimal assistance.    Time 4   Period Weeks   Status New           PT Long Term Goals - 07/20/16 2110      PT  LONG TERM GOAL #1   Title Patient and caregiver/family will demonstrate independence with updated HEP  (TARGET for all LTGs 09/17/16)   Time 8   Period Weeks   Status New     PT LONG TERM GOAL #2   Title Patient will transfer from wheelchair to/from bed with minimal assistance and LRAD on 4 of 5 transfers.   Time 8   Period Weeks   Status New     PT LONG TERM GOAL #3   Title Patient will perform sit to stand with LRAD with minimal assistance and maintain standing x 90 seconds with minimal assistance for weight-shifting/pre-gait activities.    Time 8   Period Weeks   Status New     PT LONG TERM GOAL #4   Title Patient will demonstrate improved dynamic sitting balance by reaching to her foot/shoe while sitting on edge of mat table independently.   Time 8   Period Weeks   Status New           Plan - 08/10/16 1657    Clinical Impression Statement Today's skilled session focused on issuing an HEP for LE strengthening and flexibility with no issues reported from pt/daughter/caregiver. Remainder of session addressed transfer's and standing without any issues reported. Pt is making steady progress towrad goals and should benefit from continued PT to progress toward unmet goals.    Rehab Potential Fair   Clinical Impairments Affecting Rehab Potential duration >12 months since onset CVA; left  foot drop from back surgery prior to CVA; bil LE contractures; need for assistance to carryover HEP and other mobility education   PT Frequency 2x / week   PT Duration 8 weeks   PT Treatment/Interventions ADLs/Self Care Home Management;DME Instruction;Gait training;Functional mobility training;Therapeutic activities;Therapeutic exercise;Balance training;Neuromuscular re-education;Cognitive remediation;Patient/family education;Orthotic Fit/Training;Wheelchair mobility training;Manual techniques;Moist Heat;Ultrasound;Passive range of motion;Splinting;Taping   PT Next Visit Plan continue to work on bil knee extension stretching, standing posture/balance and SPT's   Consulted and Agree with Plan of Care Patient   Family Member Consulted daughter      Patient will benefit from skilled therapeutic intervention in order to improve the following deficits and impairments:  Decreased balance, Decreased coordination, Decreased knowledge of use of DME, Decreased mobility, Decreased range of motion, Decreased strength, Difficulty walking, Impaired flexibility, Impaired sensation, Impaired UE functional use, Postural dysfunction  Visit Diagnosis: Hemiplegia and hemiparesis following cerebral infarction affecting left non-dominant side (HCC)  Muscle weakness (generalized)  Other symptoms and signs involving the musculoskeletal system  Unsteadiness on feet    Problem List Patient Active Problem List   Diagnosis Date Noted  . Carotid artery disease ([place]) 07/06/2016  . Gait abnormality 05/13/2016  . Cerebrovascular accident (CVA) due to thrombosis of right anterior cerebral artery ([place]) 05/13/2016  . Complete heart block ([place]) 05/07/2016  . Benign essential HTN 08/21/2015  . Acute CVA (cerebrovascular accident) ([place]) 08/04/2015  . Left hemiparesis ([place]) 08/04/2015  . Primary osteoarthritis of left knee 08/04/2015  . Esophageal reflux 08/04/2015  . Hyperlipidemia LDL goal <70 08/04/2015  .  Paroxysmal atrial fibrillation ([place]) 08/04/2015  . PVD (peripheral vascular disease) ([place]) 08/04/2015  . Protein-calorie malnutrition ([place]) 08/04/2015    Willow Ora, PTA, [facility] [address], [place], [place] [address] [phone] 08/11/16, 11:03 PM   Name: Virginia Meyer MRN: [id number] Date of Birth: 01-28-1929

## 2016-08-17 ENCOUNTER — Ambulatory Visit: Payer: Medicare HMO | Admitting: Physical Therapy

## 2016-08-17 ENCOUNTER — Encounter: Payer: Self-pay | Admitting: Physical Therapy

## 2016-08-17 DIAGNOSIS — R29898 Other symptoms and signs involving the musculoskeletal system: Secondary | ICD-10-CM

## 2016-08-17 DIAGNOSIS — M6281 Muscle weakness (generalized): Secondary | ICD-10-CM

## 2016-08-17 DIAGNOSIS — I69354 Hemiplegia and hemiparesis following cerebral infarction affecting left non-dominant side: Secondary | ICD-10-CM | POA: Diagnosis not present

## 2016-08-17 DIAGNOSIS — R2681 Unsteadiness on feet: Secondary | ICD-10-CM | POA: Diagnosis not present

## 2016-08-18 NOTE — Therapy (Signed)
[facility] [address] [place], [place], [address] Phone: [phone]   Fax:  [phone]  Physical Therapy Treatment  Patient Details  Name: Virginia Meyer MRN: [id number] Date of Birth: Jan 10, 1929 Referring Provider: Sarina Ill, MD  Encounter Date: 08/17/2016   08/17/16 0940  PT Visits / Re-Eval  Visit Number 3  Number of Visits 17  Date for PT Re-Evaluation 09/17/16  Authorization  Authorization Type MCR; G-Code  Authorization Time Period 07/20/16 to 09/17/16  PT Time Calculation  PT Start Time 0935  PT Stop Time 1015  PT Time Calculation (min) 40 min  PT - End of Session  Equipment Utilized During Treatment Gait belt  Activity Tolerance Patient tolerated treatment well  Behavior During Therapy [facility] for tasks assessed/performed     Past Medical History:  Diagnosis Date  . Anxiety   . Fall   . GERD (gastroesophageal reflux disease)   . Heart failure ([place])   . HLD (hyperlipidemia)   . HTN (hypertension)   . Hyperlipidemia   . Hypotension   . Major depressive disorder   . Neuropathy   . PVD (peripheral vascular disease) ([place])   . Stroke [facility])    left sided deficit  . UTI (urinary tract infection)     Past Surgical History:  Procedure Laterality Date  . ABDOMINAL HYSTERECTOMY    . FOOT NEUROMA SURGERY    . SPINE SURGERY    . TONSILLECTOMY      There were no vitals filed for this visit.    08/17/16 0939  Symptoms/Limitations  Subjective No new complaints.No falls or pain to report. Has been doing the stretches at home. Caregiver reports pt is standing better at home.   Patient is accompained by: Family member  Pertinent History hospitalized with CHB 04/2016, afib, HTN, peripheral neuropathy, CHF, PVD, 05/27/15 large R MCA CVA, lt foot drop due to back sx 2015  Limitations Standing;Walking  How long can you stand comfortably? unable  How long can you walk comfortably? unable; self-propels  her w/c with bil LEs and RUE  Patient Stated Goals she wants to learn to stand and walk again.  Pain Assessment  Currently in Pain? No/denies  Pain Score 0      08/17/16 0940  Transfers  Transfers Sit to Stand;Stand to Sit;Stand Pivot Transfers  Sit to Stand 3: Mod assist  Sit to Stand Details Verbal cues for sequencing;Verbal cues for technique;Manual facilitation for weight shifting;Manual facilitation for placement  Sit to Stand Details (indicate cue type and reason) x 2 reps: one with UE support on PTA, one to RW for gait. cues to scoot forward, for weight shifting and sequencing.   Stand to Sit 3: Mod assist  Stand to Sit Details (indicate cue type and reason) Verbal cues for sequencing;Verbal cues for technique;Manual facilitation for weight shifting;Manual facilitation for placement  Stand to Sit Details cues for use of arms to control descent and for more hip/knee flexion with sitting down.   Stand Pivot Transfers 2: Max assist  Stand Pivot Transfer Details (indicate cue type and reason) from wheelchair to mat table with cues/facilitation for posture, weight shifting and technique.   Transfer via Copy use of black stander for initial stand (in addition to 2 stands listed above): once up in [place] worked on increasing knee extension in weight bearing via [place] while working on UE work/decreased UE support- alternating UE raises, bil UE raises and upper trunk rotation with reaching  behind her x 10 reps each. no issues reported with stander today.                 Comments on first stand: with UE support on PTA seated in front of her worked on posture, knee extension and equal LE weight bearing.   Ambulation/Gait  Ambulation/Gait Yes  Ambulation/Gait Assistance 3: Mod assist (plus second person for safety/assist with rt knee in stance)  Ambulation/Gait Assistance Details cues/facilitation needed for posture, weight shifting, step length and  for increased hip/knee extension with gait and stance.   Ambulation Distance (Feet) 7 Feet  Assistive device Rolling walker  Gait Pattern Step-to pattern;Step-through pattern;Decreased stride length;Right flexed knee in stance;Left flexed knee in stance;Trunk flexed;Narrow base of support  Ambulation Surface Level;Indoor  Exercises  Other Exercises  passive stretching of bil knee extensors with gentle overpressure, hamstrings and heel cords in supine on mat. pillows under ankles for knee extensor stretching.          PT Short Term Goals - 07/20/16 2055      PT SHORT TERM GOAL #1   Title Patient and caregiver/family will demonstrate independence with HEP for increasing ROM and strength for carryover into functional tasks. (TARGET for all STGs 08/20/16 **may need to be adjusted based on date of first treatment session available)   Time 4   Period Weeks   Status New     PT SHORT TERM GOAL #2   Title Patient will increase bil Knee extension ROM by 15 degrees to improve patient's ability to progress to standing balance activities.    Time 4   Period Weeks   Status New     PT SHORT TERM GOAL #3   Title Patient will transfer wheelchair to/from bed with no more than moderate assistance on 3 of 5 transfers with LRAD (as indicated).    Time 4   Period Weeks   Status New     PT SHORT TERM GOAL #4   Title Patient will demonstrate dynamic sitting balance by reaching >6 inches anteriorly while sitting on edge of mat table with supervision.    Time 4   Period Weeks   Status New     PT SHORT TERM GOAL #5   Title Patient will perform sit to stand in // bars with moderate assistance (pushing up from w/c, transitioning hands to //bars) and maintain standing x 90 seconds with minimal assistance.    Time 4   Period Weeks   Status New           PT Long Term Goals - 07/20/16 2110      PT LONG TERM GOAL #1   Title Patient and caregiver/family will demonstrate independence with updated HEP   (TARGET for all LTGs 09/17/16)   Time 8   Period Weeks   Status New     PT LONG TERM GOAL #2   Title Patient will transfer from wheelchair to/from bed with minimal assistance and LRAD on 4 of 5 transfers.   Time 8   Period Weeks   Status New     PT LONG TERM GOAL #3   Title Patient will perform sit to stand with LRAD with minimal assistance and maintain standing x 90 seconds with minimal assistance for weight-shifting/pre-gait activities.    Time 8   Period Weeks   Status New     PT LONG TERM GOAL #4   Title Patient will demonstrate improved dynamic sitting balance by reaching to her foot/shoe  while sitting on edge of mat table independently.   Time 8   Period Weeks   Status New         08/17/16 0940  Plan  Clinical Impression Statement Today's skilled session continued to focus on bil knee extension, transfers and intitated gait training with RW. Pt did have increased activity tolerance today vs previous session with increased activity today. Pt does continue to need increased assistance with transfers and now gait. Pt is progressing toward goals and should benefit from continued PT to progress toward unmet goals.                          Pt will benefit from skilled therapeutic intervention in order to improve on the following deficits Decreased balance;Decreased coordination;Decreased knowledge of use of DME;Decreased mobility;Decreased range of motion;Decreased strength;Difficulty walking;Impaired flexibility;Impaired sensation;Impaired UE functional use;Postural dysfunction  Rehab Potential Fair  Clinical Impairments Affecting Rehab Potential duration >12 months since onset CVA; left foot drop from back surgery prior to CVA; bil LE contractures; need for assistance to carryover HEP and other mobility education  PT Frequency 2x / week  PT Duration 8 weeks  PT Treatment/Interventions ADLs/Self Care Home Management;DME Instruction;Gait training;Functional mobility  training;Therapeutic activities;Therapeutic exercise;Balance training;Neuromuscular re-education;Cognitive remediation;Patient/family education;Orthotic Fit/Training;Wheelchair mobility training;Manual techniques;Moist Heat;Ultrasound;Passive range of motion;Splinting;Taping  PT Next Visit Plan continue to work on bil knee extension stretching, standing posture/balance and transfers (stand<>pivot, and sit<>stand), gait with short RW. ? simulated toe caps on shoes to assist with foot clearance with gait  Consulted and Agree with Plan of Care Patient  Family Member Consulted daughter     Patient will benefit from skilled therapeutic intervention in order to improve the following deficits and impairments:  Decreased balance, Decreased coordination, Decreased knowledge of use of DME, Decreased mobility, Decreased range of motion, Decreased strength, Difficulty walking, Impaired flexibility, Impaired sensation, Impaired UE functional use, Postural dysfunction  Visit Diagnosis: Hemiplegia and hemiparesis following cerebral infarction affecting left non-dominant side (HCC)  Muscle weakness (generalized)  Other symptoms and signs involving the musculoskeletal system  Unsteadiness on feet     Problem List Patient Active Problem List   Diagnosis Date Noted  . Carotid artery disease ([place]) 07/06/2016  . Gait abnormality 05/13/2016  . Cerebrovascular accident (CVA) due to thrombosis of right anterior cerebral artery ([place]) 05/13/2016  . Complete heart block ([place]) 05/07/2016  . Benign essential HTN 08/21/2015  . Acute CVA (cerebrovascular accident) ([place]) 08/04/2015  . Left hemiparesis ([place]) 08/04/2015  . Primary osteoarthritis of left knee 08/04/2015  . Esophageal reflux 08/04/2015  . Hyperlipidemia LDL goal <70 08/04/2015  . Paroxysmal atrial fibrillation ([place]) 08/04/2015  . PVD (peripheral vascular disease) ([place]) 08/04/2015  . Protein-calorie malnutrition ([place]) 08/04/2015    Willow Ora,  PTA, [facility] [address], [place], [place] [address] [phone] 08/18/16, 10:43 PM   Name: Virginia Meyer MRN: [id number] Date of Birth: 03-08-1928

## 2016-08-19 ENCOUNTER — Encounter: Payer: Self-pay | Admitting: Physical Therapy

## 2016-08-19 ENCOUNTER — Ambulatory Visit: Payer: Medicare HMO | Admitting: Physical Therapy

## 2016-08-19 DIAGNOSIS — R29898 Other symptoms and signs involving the musculoskeletal system: Secondary | ICD-10-CM

## 2016-08-19 DIAGNOSIS — I69354 Hemiplegia and hemiparesis following cerebral infarction affecting left non-dominant side: Secondary | ICD-10-CM

## 2016-08-19 DIAGNOSIS — R2681 Unsteadiness on feet: Secondary | ICD-10-CM | POA: Diagnosis not present

## 2016-08-19 DIAGNOSIS — M6281 Muscle weakness (generalized): Secondary | ICD-10-CM

## 2016-08-19 NOTE — Therapy (Signed)
[facility] [address] [place], [place], [address] Phone: [phone]   Fax:  [phone]  Physical Therapy Treatment  Patient Details  Name: Virginia Meyer MRN: [id number] Date of Birth: 1928-07-13 Referring Provider: Sarina Ill, MD  Encounter Date: 08/19/2016      PT End of Session - 08/19/16 1130    Visit Number 4   Number of Visits 17   Date for PT Re-Evaluation 09/17/16   Authorization Type MCR; G-Code   Authorization Time Period 07/20/16 to 09/17/16   PT Start Time 1017   PT Stop Time 1055   PT Time Calculation (min) 38 min   Equipment Utilized During Treatment Gait belt   Activity Tolerance Patient limited by fatigue   Behavior During Therapy [facility] for tasks assessed/performed      Past Medical History:  Diagnosis Date  . Anxiety   . Fall   . GERD (gastroesophageal reflux disease)   . Heart failure ([place])   . HLD (hyperlipidemia)   . HTN (hypertension)   . Hyperlipidemia   . Hypotension   . Major depressive disorder   . Neuropathy   . PVD (peripheral vascular disease) ([place])   . Stroke [facility])    left sided deficit  . UTI (urinary tract infection)     Past Surgical History:  Procedure Laterality Date  . ABDOMINAL HYSTERECTOMY    . FOOT NEUROMA SURGERY    . SPINE SURGERY    . TONSILLECTOMY      There were no vitals filed for this visit.      Subjective Assessment - 08/19/16 1020    Subjective No new complaints. No falls or pain to report. She has been doing the stretches to stretch her legs. Caregiver reports patient is having difficulty standing tall when standing.   Patient is accompained by: Family member   Pertinent History hospitalized with CHB 04/2016, afib, HTN, peripheral neuropathy, CHF, PVD, 05/27/15 large R MCA CVA, lt foot drop due to back sx 2015   How long can you stand comfortably? unable   How long can you walk comfortably? unable; self-propels her w/c with bil LEs and RUE    Patient Stated Goals she wants to learn to stand and walk again.   Currently in Pain? No/denies   Pain Score 0-No pain          [facility] Adult PT Treatment/Exercise - 08/19/16 1058      Transfers   Transfers Sit to Stand;Stand to [employer] Transfers   Sit to Stand 3: Mod assist  mod-max assist on last rep   Sit to Stand Details Manual facilitation for weight shifting;Manual facilitation for placement;Verbal cues for technique;Verbal cues for sequencing  consistent VC required to use UE to assist   Sit to Stand Details (indicate cue type and reason) manual and VC to bring feet under her, scoot to edge of table , use UE for assist to stand and shift and center weight over feet; VC for posture and placement of UE to assist with posture; pt c/o knee pain with weight bearing after 3 reps; fatigue limted quality of following 2 reps   Five time sit to stand comments  --   Stand to Sit 3: Mod assist;2: Max assist   Stand to Sit Details (indicate cue type and reason) Verbal cues for technique;Verbal cues for sequencing;Manual facilitation for weight shifting;Manual facilitation for placement   Stand to Sit Details VC required to use UE to help control descent to  mat table   Stand Pivot Transfers 2: Max assist   Stand Pivot Transfer Details (indicate cue type and reason) from [facility] <>mat table with cues for facilitation posture, weight shifting and technique   Squat Pivot Transfer Details (indicate cue type and reason) --   Number of Reps 1 set;Other reps (comment)  5 reps   Comments alternating UE raises x10 on 3rd trial sit to stand     Exercises   Other Exercises  passive stretching of B knee extensors/hamstrings with gentle overpressure seated EOB with foot on stool; passive stretching hamstrings, and heel cords in supine on mat, 30s/3x each; bridge x8 with VC to weight bear in left foot, manual assist to track L knee when up in bridge              PT Short Term Goals - 07/20/16  2055      PT SHORT TERM GOAL #1   Title Patient and caregiver/family will demonstrate independence with HEP for increasing ROM and strength for carryover into functional tasks. (TARGET for all STGs 08/20/16 **may need to be adjusted based on date of first treatment session available)   Time 4   Period Weeks   Status New     PT SHORT TERM GOAL #2   Title Patient will increase bil Knee extension ROM by 15 degrees to improve patient's ability to progress to standing balance activities.    Time 4   Period Weeks   Status New     PT SHORT TERM GOAL #3   Title Patient will transfer wheelchair to/from bed with no more than moderate assistance on 3 of 5 transfers with LRAD (as indicated).    Time 4   Period Weeks   Status New     PT SHORT TERM GOAL #4   Title Patient will demonstrate dynamic sitting balance by reaching >6 inches anteriorly while sitting on edge of mat table with supervision.    Time 4   Period Weeks   Status New     PT SHORT TERM GOAL #5   Title Patient will perform sit to stand in // bars with moderate assistance (pushing up from w/c, transitioning hands to //bars) and maintain standing x 90 seconds with minimal assistance.    Time 4   Period Weeks   Status New           PT Long Term Goals - 07/20/16 2110      PT LONG TERM GOAL #1   Title Patient and caregiver/family will demonstrate independence with updated HEP  (TARGET for all LTGs 09/17/16)   Time 8   Period Weeks   Status New     PT LONG TERM GOAL #2   Title Patient will transfer from wheelchair to/from bed with minimal assistance and LRAD on 4 of 5 transfers.   Time 8   Period Weeks   Status New     PT LONG TERM GOAL #3   Title Patient will perform sit to stand with LRAD with minimal assistance and maintain standing x 90 seconds with minimal assistance for weight-shifting/pre-gait activities.    Time 8   Period Weeks   Status New     PT LONG TERM GOAL #4   Title Patient will demonstrate improved  dynamic sitting balance by reaching to her foot/shoe while sitting on edge of mat table independently.   Time 8   Period Weeks   Status New  Plan - 08/19/16 1131    Clinical Impression Statement Today's skilled session focused on weight shifting, sequencing, and endurance for sit <>stand transfers as well as review of HEP. Patient demonstrated decreased ability to move her R foot on her own. She requires consistent VC & manual facilitation for sit<> stand transfers.  Patient should benefit continued PT to progress towards unmet goals   Clinical Impairments Affecting Rehab Potential duration >12 months since onset CVA; left foot drop from back surgery prior to CVA; bil LE contractures; need for assistance to carryover HEP and other mobility education   PT Frequency 2x / week   PT Duration 8 weeks   PT Treatment/Interventions ADLs/Self Care Home Management;DME Instruction;Gait training;Functional mobility training;Therapeutic activities;Therapeutic exercise;Balance training;Neuromuscular re-education;Cognitive remediation;Patient/family education;Orthotic Fit/Training;Wheelchair mobility training;Manual techniques;Moist Heat;Ultrasound;Passive range of motion;Splinting;Taping   PT Next Visit Plan Continue work on bil knee extension stretching, standing posture/balance and transfers (stand<>pivot as well as sit <>stand), and initiation of movement of right foot.   Consulted and Agree with Plan of Care Patient      Patient will benefit from skilled therapeutic intervention in order to improve the following deficits and impairments:  Decreased balance, Decreased coordination, Decreased knowledge of use of DME, Decreased mobility, Decreased range of motion, Decreased strength, Difficulty walking, Impaired flexibility, Impaired sensation, Impaired UE functional use, Postural dysfunction  Visit Diagnosis: Hemiplegia and hemiparesis following cerebral infarction affecting left  non-dominant side (HCC)  Muscle weakness (generalized)  Other symptoms and signs involving the musculoskeletal system  Unsteadiness on feet     Problem List Patient Active Problem List   Diagnosis Date Noted  . Carotid artery disease ([place]) 07/06/2016  . Gait abnormality 05/13/2016  . Cerebrovascular accident (CVA) due to thrombosis of right anterior cerebral artery ([place]) 05/13/2016  . Complete heart block ([place]) 05/07/2016  . Benign essential HTN 08/21/2015  . Acute CVA (cerebrovascular accident) ([place]) 08/04/2015  . Left hemiparesis ([place]) 08/04/2015  . Primary osteoarthritis of left knee 08/04/2015  . Esophageal reflux 08/04/2015  . Hyperlipidemia LDL goal <70 08/04/2015  . Paroxysmal atrial fibrillation ([place]) 08/04/2015  . PVD (peripheral vascular disease) ([place]) 08/04/2015  . Protein-calorie malnutrition ([place]) 08/04/2015    Rulon Eisenmenger 08/20/2016, 8:24 AM  [facility] [address] [place], [place], [address] Phone: [phone]   Fax:  [phone]  Name: Virginia Meyer MRN: [id number] Date of Birth: 08-Dec-1928

## 2016-08-24 ENCOUNTER — Encounter: Payer: Self-pay | Admitting: Physical Therapy

## 2016-08-24 ENCOUNTER — Ambulatory Visit: Payer: Medicare HMO | Attending: Neurology | Admitting: Physical Therapy

## 2016-08-24 DIAGNOSIS — R29898 Other symptoms and signs involving the musculoskeletal system: Secondary | ICD-10-CM | POA: Diagnosis present

## 2016-08-24 DIAGNOSIS — M21372 Foot drop, left foot: Secondary | ICD-10-CM | POA: Diagnosis present

## 2016-08-24 DIAGNOSIS — R2681 Unsteadiness on feet: Secondary | ICD-10-CM

## 2016-08-24 DIAGNOSIS — I69354 Hemiplegia and hemiparesis following cerebral infarction affecting left non-dominant side: Secondary | ICD-10-CM | POA: Diagnosis not present

## 2016-08-24 DIAGNOSIS — M6281 Muscle weakness (generalized): Secondary | ICD-10-CM

## 2016-08-24 NOTE — Therapy (Signed)
[facility] [address] [place], [place], [address] Phone: [phone]   Fax:  [phone]  Physical Therapy Treatment  Patient Details  Name: Virginia Meyer MRN: [id number] Date of Birth: 17-Sep-1928 Referring Provider: Sarina Ill, MD  Encounter Date: 08/24/2016      PT End of Session - 08/24/16 1046    Visit Number 5   Number of Visits 17   Date for PT Re-Evaluation 09/17/16   Authorization Type MCR; G-Code   Authorization Time Period 07/20/16 to 09/17/16   PT Start Time 0932   PT Stop Time 1015   PT Time Calculation (min) 43 min   Equipment Utilized During Treatment Gait belt   Activity Tolerance Patient limited by fatigue   Behavior During Therapy [facility] for tasks assessed/performed      Past Medical History:  Diagnosis Date  . Anxiety   . Fall   . GERD (gastroesophageal reflux disease)   . Heart failure ([place])   . HLD (hyperlipidemia)   . HTN (hypertension)   . Hyperlipidemia   . Hypotension   . Major depressive disorder   . Neuropathy   . PVD (peripheral vascular disease) ([place])   . Stroke [facility])    left sided deficit  . UTI (urinary tract infection)     Past Surgical History:  Procedure Laterality Date  . ABDOMINAL HYSTERECTOMY    . FOOT NEUROMA SURGERY    . SPINE SURGERY    . TONSILLECTOMY      There were no vitals filed for this visit.      Subjective Assessment - 08/24/16 0933    Subjective No new complaints. No falls or pain to report. Patient reports she has been doing her home exercises, mostly the stretches.   Patient is accompained by: Family member   Pertinent History hospitalized with CHB 04/2016, afib, HTN, peripheral neuropathy, CHF, PVD, 05/27/15 large R MCA CVA, lt foot drop due to back sx 2015   Limitations Standing;Walking   Patient Stated Goals she wants to learn to stand and walk again.   Currently in Pain? No/denies   Pain Score 0-No pain           [facility] Adult PT  Treatment/Exercise - 08/24/16 0936      Transfers   Transfers Sit to Stand;Stand Pivot Transfers;Stand to Sit   Sit to Stand 3: Mod assist   Sit to Stand Details Manual facilitation for placement;Verbal cues for technique;Verbal cues for sequencing;Manual facilitation for weight shifting  VC use UE for assist   Sit to Stand Details (indicate cue type and reason) x 6 reps, pt c/o R knee pain after 4th rep, manual and VC to bring feet under her, scoot to edge of table , use UE for assist to stand and shift and center weight over feet; VC for posture and placement of UE to assist with posture   Stand to Sit 3: Mod assist;4: Min assist   Stand to Sit Details (indicate cue type and reason) Verbal cues for technique;Verbal cues for sequencing;Manual facilitation for weight shifting;Manual facilitation for placement   Stand to Sit Details VC to reach back and control descent down to mat table   Stand Pivot Transfers 3: Mod assist   Stand Pivot Transfer Details (indicate cue type and reason) from [facility] <>mat table with cues for facilitation posture, weight shifting and technique, pt able to take few steps to assist transfer   Comments B UE raises x5 and alternating UE raises x8  on 2nd stand, VC for posture, knee extension and equal LE weight bearing.     Exercises   Other Exercises  Passive stretching B knee extensors/hamstrings/heel cords 30sec/3x each; heel slides x10 R & L; seated on balance disk, alternating UE raises x10 each, alternating reaches back to R/L head follows hand x10 each, static seated balance on balance disk x2 minutes, required B UE and min assist to maintain balance, B seated toe raises x15; R alternating heel/toe raise on wobble board block practice x2 minutes, seated on EOB, VC for weight shifting, posture required throughout all trials.            PT Short Term Goals - 07/20/16 2055      PT SHORT TERM GOAL #1   Title Patient and caregiver/family will demonstrate independence  with HEP for increasing ROM and strength for carryover into functional tasks. (TARGET for all STGs 08/20/16 **may need to be adjusted based on date of first treatment session available)   Time 4   Period Weeks   Status New     PT SHORT TERM GOAL #2   Title Patient will increase bil Knee extension ROM by 15 degrees to improve patient's ability to progress to standing balance activities.    Time 4   Period Weeks   Status New     PT SHORT TERM GOAL #3   Title Patient will transfer wheelchair to/from bed with no more than moderate assistance on 3 of 5 transfers with LRAD (as indicated).    Time 4   Period Weeks   Status New     PT SHORT TERM GOAL #4   Title Patient will demonstrate dynamic sitting balance by reaching >6 inches anteriorly while sitting on edge of mat table with supervision.    Time 4   Period Weeks   Status New     PT SHORT TERM GOAL #5   Title Patient will perform sit to stand in // bars with moderate assistance (pushing up from w/c, transitioning hands to //bars) and maintain standing x 90 seconds with minimal assistance.    Time 4   Period Weeks   Status New           PT Long Term Goals - 07/20/16 2110      PT LONG TERM GOAL #1   Title Patient and caregiver/family will demonstrate independence with updated HEP  (TARGET for all LTGs 09/17/16)   Time 8   Period Weeks   Status New     PT LONG TERM GOAL #2   Title Patient will transfer from wheelchair to/from bed with minimal assistance and LRAD on 4 of 5 transfers.   Time 8   Period Weeks   Status New     PT LONG TERM GOAL #3   Title Patient will perform sit to stand with LRAD with minimal assistance and maintain standing x 90 seconds with minimal assistance for weight-shifting/pre-gait activities.    Time 8   Period Weeks   Status New     PT LONG TERM GOAL #4   Title Patient will demonstrate improved dynamic sitting balance by reaching to her foot/shoe while sitting on edge of mat table independently.    Time 8   Period Weeks   Status New           Plan - 08/24/16 1049    Clinical Impression Statement Today's skilled session focused on functional mobility from sit to stand, sitting balance, and LE ROM and strengthening.  Patient demonstrated improved ability to move feet to assist transfer from WC<>mat table as well as improved initiation during sit to stand during first 3 trials. Verbal cues improved posture and tactile cues facilitated weight shifting and positioning of LE's. Patient requires consistent verbal, visual, and tactile cues for weight shifting during seated balance exercises and with sit <>stand transfers. She will benefit from continued PT to continue to progress towards unmet goals.   Rehab Potential Fair   Clinical Impairments Affecting Rehab Potential duration >12 months since onset CVA; left foot drop from back surgery prior to CVA; bil LE contractures; need for assistance to carryover HEP and other mobility education   PT Frequency 2x / week   PT Duration 8 weeks   PT Treatment/Interventions ADLs/Self Care Home Management;DME Instruction;Gait training;Functional mobility training;Therapeutic activities;Therapeutic exercise;Balance training;Neuromuscular re-education;Cognitive remediation;Patient/family education;Orthotic Fit/Training;Wheelchair mobility training;Manual techniques;Moist Heat;Ultrasound;Passive range of motion;Splinting;Taping   PT Next Visit Plan Check STG's as we are in week 3 of tx   Consulted and Agree with Plan of Care Patient      Patient will benefit from skilled therapeutic intervention in order to improve the following deficits and impairments:  Decreased balance, Decreased coordination, Decreased knowledge of use of DME, Decreased mobility, Decreased range of motion, Decreased strength, Difficulty walking, Impaired flexibility, Impaired sensation, Impaired UE functional use, Postural dysfunction  Visit Diagnosis: Hemiplegia and hemiparesis  following cerebral infarction affecting left non-dominant side (HCC)  Muscle weakness (generalized)  Other symptoms and signs involving the musculoskeletal system  Unsteadiness on feet     Problem List Patient Active Problem List   Diagnosis Date Noted  . Carotid artery disease ([place]) 07/06/2016  . Gait abnormality 05/13/2016  . Cerebrovascular accident (CVA) due to thrombosis of right anterior cerebral artery ([place]) 05/13/2016  . Complete heart block ([place]) 05/07/2016  . Benign essential HTN 08/21/2015  . Acute CVA (cerebrovascular accident) ([place]) 08/04/2015  . Left hemiparesis ([place]) 08/04/2015  . Primary osteoarthritis of left knee 08/04/2015  . Esophageal reflux 08/04/2015  . Hyperlipidemia LDL goal <70 08/04/2015  . Paroxysmal atrial fibrillation ([place]) 08/04/2015  . PVD (peripheral vascular disease) ([place]) 08/04/2015  . Protein-calorie malnutrition ([place]) 08/04/2015    Rulon Eisenmenger 08/24/2016, 11:29 AM  [facility] [address] [place], [place], [address] Phone: [phone]   Fax:  [phone]  Name: Virginia Meyer MRN: [id number] Date of Birth: Jul 12, 1928

## 2016-08-26 ENCOUNTER — Encounter: Payer: Self-pay | Admitting: Physical Therapy

## 2016-08-26 ENCOUNTER — Ambulatory Visit: Payer: Medicare HMO | Admitting: Physical Therapy

## 2016-08-26 DIAGNOSIS — I69354 Hemiplegia and hemiparesis following cerebral infarction affecting left non-dominant side: Secondary | ICD-10-CM

## 2016-08-26 DIAGNOSIS — M6281 Muscle weakness (generalized): Secondary | ICD-10-CM

## 2016-08-26 DIAGNOSIS — R2681 Unsteadiness on feet: Secondary | ICD-10-CM

## 2016-08-26 DIAGNOSIS — R29898 Other symptoms and signs involving the musculoskeletal system: Secondary | ICD-10-CM

## 2016-08-26 NOTE — Therapy (Signed)
[facility] [address] [place], [place], [address] Phone: [phone]   Fax:  [phone]  Physical Therapy Treatment  Patient Details  Name: Virginia Meyer MRN: [id number] Date of Birth: 05-05-1928 Referring Provider: Sarina Ill, MD  Encounter Date: 08/26/2016      PT End of Session - 08/26/16 1806    Visit Number 6   Number of Visits 17   Date for PT Re-Evaluation 09/17/16   Authorization Type MCR; G-Code   Authorization Time Period 07/20/16 to 09/17/16   PT Start Time 1104   PT Stop Time 1145   PT Time Calculation (min) 41 min   Equipment Utilized During Treatment Gait belt   Activity Tolerance Patient tolerated treatment well   Behavior During Therapy [facility] for tasks assessed/performed      Past Medical History:  Diagnosis Date  . Anxiety   . Fall   . GERD (gastroesophageal reflux disease)   . Heart failure ([place])   . HLD (hyperlipidemia)   . HTN (hypertension)   . Hyperlipidemia   . Hypotension   . Major depressive disorder   . Neuropathy   . PVD (peripheral vascular disease) ([place])   . Stroke [facility])    left sided deficit  . UTI (urinary tract infection)     Past Surgical History:  Procedure Laterality Date  . ABDOMINAL HYSTERECTOMY    . FOOT NEUROMA SURGERY    . SPINE SURGERY    . TONSILLECTOMY      There were no vitals filed for this visit.      Subjective Assessment - 08/26/16 1753    Subjective Reports no changes. Has been doing her leg stretches mostly in the morning.    Patient is accompained by: Family member   Pertinent History hospitalized with CHB 04/2016, afib, HTN, peripheral neuropathy, CHF, PVD, 05/27/15 large R MCA CVA, lt foot drop due to back sx 2015   Limitations Standing;Walking   Patient Stated Goals she wants to learn to stand and walk again.   Currently in Pain? No/denies                         [facility] Adult PT Treatment/Exercise - 08/26/16 0001       Transfers   Transfers Sit to Stand;Stand to Sit;Stand Pivot Transfers   Sit to Stand 3: Mod assist   Sit to Stand Details Manual facilitation for weight shifting;Verbal cues for sequencing;Manual facilitation for weight bearing;Verbal cues for technique;Verbal cues for safe use of DME/AE   Stand to Sit 3: Mod assist;With upper extremity assist   Stand to Sit Details (indicate cue type and reason) Verbal cues for sequencing;Verbal cues for safe use of DME/AE;Manual facilitation for weight shifting   Stand Pivot Transfers 2: Max assist;With armrests   Stand Pivot Transfer Details (indicate cue type and reason) from w/c to mat; assist to wt-shift, however continued with difficulty advancing RLE (Lt knee buckles, cannot fully extend lt knee)   Comments stood in standing frame with bil hip, knee and heelcord stretching x 10 minutes (achieved ~ -30 knee extension bil, ~ -40 bil hip exteension     Ambulation/Gait   Ambulation/Gait Assistance 2: Max assist  +2 for w/c follow   Ambulation/Gait Assistance Details facilitation for upright posture (especially in wt-bearing on LLE)   Ambulation Distance (Feet) 8 Feet   Assistive device Rolling walker   Gait Pattern Step-to pattern;Decreased step length - right;Decreased step length - left;Decreased  stance time - left;Decreased dorsiflexion - left;Left steppage;Right flexed knee in stance;Left flexed knee in stance;Trunk flexed;Narrow base of support     Therapeutic Activites    Therapeutic Activities Other Therapeutic Activities                PT Education - 08/26/16 1805    Education provided Yes   Education Details Discussed her stretching routine for Lt knee; spends entire afternoon seated in hip/knee flexion; educated to use a folding foot stool to prop Lt foot upon; called and discussed with her daughter and she will look to buy a folding stool for pt   Person(s) Educated Patient;Child(ren)   Methods Explanation;Demonstration    Comprehension Verbalized understanding;Need further instruction          PT Short Term Goals - 08/26/16 1920      PT SHORT TERM GOAL #1   Title Patient and caregiver/family will demonstrate independence with HEP for increasing ROM and strength for carryover into functional tasks. (TARGET for all STGs 08/20/16 **may need to be adjusted based on date of first treatment session available) New TARGET 09/02/16 due to delayed start.    Time 4   Period Weeks   Status On-going     PT SHORT TERM GOAL #2   Title Patient will increase bil Knee extension ROM by 15 degrees to improve patient's ability to progress to standing balance activities.    Time 4   Period Weeks   Status On-going     PT SHORT TERM GOAL #3   Title Patient will transfer wheelchair to/from bed with no more than moderate assistance on 3 of 5 transfers with LRAD (as indicated).    Time 4   Period Weeks   Status On-going     PT SHORT TERM GOAL #4   Title Patient will demonstrate dynamic sitting balance by reaching >6 inches anteriorly while sitting on edge of mat table with supervision.    Time 4   Period Weeks   Status On-going     PT SHORT TERM GOAL #5   Title Patient will perform sit to stand in // bars with moderate assistance (pushing up from w/c, transitioning hands to //bars) and maintain standing x 90 seconds with minimal assistance.    Time 4   Period Weeks   Status On-going           PT Long Term Goals - 07/20/16 2110      PT LONG TERM GOAL #1   Title Patient and caregiver/family will demonstrate independence with updated HEP  (TARGET for all LTGs 09/17/16)   Time 8   Period Weeks   Status New     PT LONG TERM GOAL #2   Title Patient will transfer from wheelchair to/from bed with minimal assistance and LRAD on 4 of 5 transfers.   Time 8   Period Weeks   Status New     PT LONG TERM GOAL #3   Title Patient will perform sit to stand with LRAD with minimal assistance and maintain standing x 90 seconds  with minimal assistance for weight-shifting/pre-gait activities.    Time 8   Period Weeks   Status New     PT LONG TERM GOAL #4   Title Patient will demonstrate improved dynamic sitting balance by reaching to her foot/shoe while sitting on edge of mat table independently.   Time 8   Period Weeks   Status New  Plan - 08/26/16 1808    Clinical Impression Statement Session focused on bil LE stretching in weight-bearing, transfer training, and gait training. Lt knee contracture makes LLE functionally shorter than RLE making it difficult for her to advance her RLE. In addition, her lt knee buckles due to weakness and position. She is very motivated and willing to increase the amount of time she spends stretching her left knee. Currently continues to make progress and will continue to work towards [place] (to be assessed by 09/02/16 due to delay in initiating treatment visits due to wait list to get appointments).    Rehab Potential Fair   Clinical Impairments Affecting Rehab Potential duration >12 months since onset CVA; left foot drop from back surgery prior to CVA; bil LE contractures; need for assistance to carryover HEP and other mobility education   PT Frequency 2x / week   PT Duration 8 weeks   PT Treatment/Interventions ADLs/Self Care Home Management;DME Instruction;Gait training;Functional mobility training;Therapeutic activities;Therapeutic exercise;Balance training;Neuromuscular re-education;Cognitive remediation;Patient/family education;Orthotic Fit/Training;Wheelchair mobility training;Manual techniques;Moist Heat;Ultrasound;Passive range of motion;Splinting;Taping   PT Next Visit Plan Check STGs by 7/12 (end of 4th week since treatment begun); find out if dtr got her a folding stool to take to [facility], etc for her to prop her Lt foot on for knee extension stretch; ?try standing in // bars for 90 sec (it's a STG)-? with 2-4" block under LLE to account for shortened leg due to  knee contracture; ? try lateral scoot transfer for incr independence   Consulted and Agree with Plan of Care Patient      Patient will benefit from skilled therapeutic intervention in order to improve the following deficits and impairments:  Decreased balance, Decreased coordination, Decreased knowledge of use of DME, Decreased mobility, Decreased range of motion, Decreased strength, Difficulty walking, Impaired flexibility, Impaired sensation, Impaired UE functional use, Postural dysfunction  Visit Diagnosis: Hemiplegia and hemiparesis following cerebral infarction affecting left non-dominant side (HCC)  Muscle weakness (generalized)  Other symptoms and signs involving the musculoskeletal system  Unsteadiness on feet     Problem List Patient Active Problem List   Diagnosis Date Noted  . Carotid artery disease ([place]) 07/06/2016  . Gait abnormality 05/13/2016  . Cerebrovascular accident (CVA) due to thrombosis of right anterior cerebral artery ([place]) 05/13/2016  . Complete heart block ([place]) 05/07/2016  . Benign essential HTN 08/21/2015  . Acute CVA (cerebrovascular accident) ([place]) 08/04/2015  . Left hemiparesis ([place]) 08/04/2015  . Primary osteoarthritis of left knee 08/04/2015  . Esophageal reflux 08/04/2015  . Hyperlipidemia LDL goal <70 08/04/2015  . Paroxysmal atrial fibrillation ([place]) 08/04/2015  . PVD (peripheral vascular disease) ([place]) 08/04/2015  . Protein-calorie malnutrition ([place]) 08/04/2015    Rexanne Mano, PT 08/26/2016, 7:26 PM  [facility] [address] [place], [place], [address] Phone: [phone]   Fax:  [phone]  Name: Allyssia Skluzacek MRN: [id number] Date of Birth: Nov 08, 1928

## 2016-08-30 DIAGNOSIS — G4700 Insomnia, unspecified: Secondary | ICD-10-CM | POA: Diagnosis not present

## 2016-08-30 DIAGNOSIS — F4322 Adjustment disorder with anxiety: Secondary | ICD-10-CM | POA: Diagnosis not present

## 2016-08-30 DIAGNOSIS — F329 Major depressive disorder, single episode, unspecified: Secondary | ICD-10-CM | POA: Diagnosis not present

## 2016-08-30 DIAGNOSIS — R69 Illness, unspecified: Secondary | ICD-10-CM | POA: Diagnosis not present

## 2016-08-30 DIAGNOSIS — R451 Restlessness and agitation: Secondary | ICD-10-CM | POA: Diagnosis not present

## 2016-08-31 ENCOUNTER — Ambulatory Visit: Payer: Medicare HMO | Admitting: Physical Therapy

## 2016-08-31 DIAGNOSIS — I69354 Hemiplegia and hemiparesis following cerebral infarction affecting left non-dominant side: Secondary | ICD-10-CM | POA: Diagnosis not present

## 2016-08-31 DIAGNOSIS — R29898 Other symptoms and signs involving the musculoskeletal system: Secondary | ICD-10-CM

## 2016-08-31 DIAGNOSIS — R2681 Unsteadiness on feet: Secondary | ICD-10-CM

## 2016-08-31 DIAGNOSIS — M6281 Muscle weakness (generalized): Secondary | ICD-10-CM

## 2016-08-31 NOTE — Therapy (Signed)
[facility] [address] [place], [place], [address] Phone: [phone]   Fax:  [phone]  Physical Therapy Treatment  Patient Details  Name: Virginia Meyer MRN: [id number] Date of Birth: 03-28-1928 Referring Provider: Sarina Ill, MD  Encounter Date: 08/31/2016      PT End of Session - 08/31/16 1210    Visit Number 7   Number of Visits 17   Date for PT Re-Evaluation 09/17/16   Authorization Type MCR; G-Code   Authorization Time Period 07/20/16 to 09/17/16   PT Start Time 1015   PT Stop Time 1100   PT Time Calculation (min) 45 min   Equipment Utilized During Treatment Gait belt   Activity Tolerance Patient tolerated treatment well   Behavior During Therapy [facility] for tasks assessed/performed      Past Medical History:  Diagnosis Date  . Anxiety   . Fall   . GERD (gastroesophageal reflux disease)   . Heart failure ([place])   . HLD (hyperlipidemia)   . HTN (hypertension)   . Hyperlipidemia   . Hypotension   . Major depressive disorder   . Neuropathy   . PVD (peripheral vascular disease) ([place])   . Stroke [facility])    left sided deficit  . UTI (urinary tract infection)     Past Surgical History:  Procedure Laterality Date  . ABDOMINAL HYSTERECTOMY    . FOOT NEUROMA SURGERY    . SPINE SURGERY    . TONSILLECTOMY      There were no vitals filed for this visit.      Subjective Assessment - 08/31/16 1209    Subjective No falls or pain reported today. Patient reports her daughter brought her a folding stool to use for her stretches while playing BINGO and other daily activities. Caregiver reported they are having a hard time keeping the left leg from externally rotating when performing the stretch. Caregiver reported she kept finding the patient "sideways" yesterday, leaning to one side and had difficulty verbally cueing patient to sit upright.            [facility] Adult PT Treatment/Exercise - 08/31/16 1126       Transfers   Transfers Sit to Stand;Stand to [employer] Transfers   Sit to Stand 3: Mod assist   Sit to Stand Details Manual facilitation for weight bearing;Manual facilitation for placement;Manual facilitation for weight shifting;Verbal cues for technique;Verbal cues for sequencing;Visual cues/gestures for sequencing   Sit to Stand Details (indicate cue type and reason) X 3 reps in parallel bars with gradual increase in stance time, stood approximately 30 seconds after first sit to stand, then 60 seconds, followed by 90 seconds, with verbal cues and manual facilitation for sequencing, weight shifting, weight bearing, and posture.  Patient performed unilateral UE reaches x3 each and bilateral UE reaches x3 during third trial standing.   Stand to Sit 3: Mod assist;With upper extremity assist   Stand to Sit Details (indicate cue type and reason) Verbal cues for sequencing;Verbal cues for technique;Manual facilitation for weight shifting;Manual facilitation for placement   Stand to Sit Details VC to reach back with arms to control descent to [facility] and mat table   Stand Pivot Transfers 3: Mod assist   Stand Pivot Transfer Details (indicate cue type and reason) from WC<>mat table with manual cues for facilitation of weight bearing, weight shifting posture, and technique. Manual facilitation for weight shifting enabled patient to take a few small steps to assist transfer.  Balance   Balance Assessed --     Exercises   Exercises Other Exercises   Other Exercises  Seated marching x10 R/L, 2 sets, Seated toe raises Rx10, Lx2 with mod assist; Patient demonstrates significant difficulty DF L foot both seated and supine; passive stretching of bil knee extensors with gentle overpressure, hamstrings and heel cords in supine on mat, bolster under ankles for knee extensor stretching.      Sitting Balance: --seated on AIREX pad, static balance for 20 seconds x3,  VC for posture and weight bearing;  unilateral UE reaches up x6, lateral x10 R<>L, down x4, min assist          PT Education - 08/31/16 1203    Education provided Yes   Education Details Discussed use of pillow between WC and L thigh to help keep L leg from externally rotating when doing passive seated hamstring stretch with L foot propped on footstool.  Also discussed using a mirror for visual feedback to help patient weight shift back to center to sit upright when caregiver is having trouble verbally cueing patient to sit upright.   Person(s) Educated Patient   Methods Explanation;Demonstration   Comprehension Verbalized understanding          PT Short Term Goals - 08/26/16 1920      PT SHORT TERM GOAL #1   Title Patient and caregiver/family will demonstrate independence with HEP for increasing ROM and strength for carryover into functional tasks. (TARGET for all STGs 08/20/16 **may need to be adjusted based on date of first treatment session available) New TARGET 09/02/16 due to delayed start.    Time 4   Period Weeks   Status On-going     PT SHORT TERM GOAL #2   Title Patient will increase bil Knee extension ROM by 15 degrees to improve patient's ability to progress to standing balance activities.    Time 4   Period Weeks   Status On-going     PT SHORT TERM GOAL #3   Title Patient will transfer wheelchair to/from bed with no more than moderate assistance on 3 of 5 transfers with LRAD (as indicated).    Time 4   Period Weeks   Status On-going     PT SHORT TERM GOAL #4   Title Patient will demonstrate dynamic sitting balance by reaching >6 inches anteriorly while sitting on edge of mat table with supervision.    Time 4   Period Weeks   Status On-going     PT SHORT TERM GOAL #5   Title Patient will perform sit to stand in // bars with moderate assistance (pushing up from w/c, transitioning hands to //bars) and maintain standing x 90 seconds with minimal assistance.    Time 4   Period Weeks   Status  On-going           PT Long Term Goals - 07/20/16 2110      PT LONG TERM GOAL #1   Title Patient and caregiver/family will demonstrate independence with updated HEP  (TARGET for all LTGs 09/17/16)   Time 8   Period Weeks   Status New     PT LONG TERM GOAL #2   Title Patient will transfer from wheelchair to/from bed with minimal assistance and LRAD on 4 of 5 transfers.   Time 8   Period Weeks   Status New     PT LONG TERM GOAL #3   Title Patient will perform sit to stand with LRAD with  minimal assistance and maintain standing x 90 seconds with minimal assistance for weight-shifting/pre-gait activities.    Time 8   Period Weeks   Status New     PT LONG TERM GOAL #4   Title Patient will demonstrate improved dynamic sitting balance by reaching to her foot/shoe while sitting on edge of mat table independently.   Time 8   Period Weeks   Status New               Plan - 08/31/16 1225    Clinical Impression Statement Today's skilled session focused on transfers, sitting balance, and LE ROM. Patient demonstrated improved standing tolerance with min report of R knee pain. She is very responsive to cues for posture which facilitate independence weight bearing. Noted improved dynamic sitting balance, sitting on a compliant surface performing unilateral UE with no LOB and min guard. Significant difficulty DF L foot without mod assist. Patient required increased manual assist of weight shifting to move feet for SPT. Will continue to work on improving knee extension L>R to increase independence weight bearing in standing and to perform transfers. Patient is progressing towards LTG's and will benefit from skilled PT to meet unmet goals.   Clinical Impairments Affecting Rehab Potential duration >12 months since onset CVA; left foot drop from back surgery prior to CVA; bil LE contractures; need for assistance to carryover HEP and other mobility education   PT Frequency 2x / week   PT  Treatment/Interventions ADLs/Self Care Home Management;DME Instruction;Gait training;Functional mobility training;Therapeutic activities;Therapeutic exercise;Balance training;Neuromuscular re-education;Cognitive remediation;Patient/family education;Orthotic Fit/Training;Wheelchair mobility training;Manual techniques;Moist Heat;Ultrasound;Passive range of motion;Splinting;Taping   PT Next Visit Plan Check STG's, incorporate AAROM to promote DF of L foot, continue to work on sitting balance, weight shifting seated and in standing   Consulted and Agree with Plan of Care Patient;Family member/caregiver   Family Member Consulted Caregiver      Patient will benefit from skilled therapeutic intervention in order to improve the following deficits and impairments:  Decreased balance, Decreased coordination, Decreased knowledge of use of DME, Decreased mobility, Decreased range of motion, Decreased strength, Difficulty walking, Impaired flexibility, Impaired sensation, Impaired UE functional use, Postural dysfunction  Visit Diagnosis: Hemiplegia and hemiparesis following cerebral infarction affecting left non-dominant side (HCC)  Muscle weakness (generalized)  Other symptoms and signs involving the musculoskeletal system  Unsteadiness on feet     Problem List Patient Active Problem List   Diagnosis Date Noted  . Carotid artery disease ([place]) 07/06/2016  . Gait abnormality 05/13/2016  . Cerebrovascular accident (CVA) due to thrombosis of right anterior cerebral artery ([place]) 05/13/2016  . Complete heart block ([place]) 05/07/2016  . Benign essential HTN 08/21/2015  . Acute CVA (cerebrovascular accident) ([place]) 08/04/2015  . Left hemiparesis ([place]) 08/04/2015  . Primary osteoarthritis of left knee 08/04/2015  . Esophageal reflux 08/04/2015  . Hyperlipidemia LDL goal <70 08/04/2015  . Paroxysmal atrial fibrillation ([place]) 08/04/2015  . PVD (peripheral vascular disease) ([place]) 08/04/2015  .  Protein-calorie malnutrition ([place]) 08/04/2015    Vaughan Sine 08/31/2016, 12:29 PM  [facility] [address] [place], [place], [address] Phone: [phone]   Fax:  [phone]  Name: Virginia Meyer MRN: [id number] Date of Birth: 13-Apr-1928  This note has been reviewed and edited by supervising CI.  Willow Ora, PTA, [facility] [address], [place], [place] [address] [phone] 08/31/16, 2:32 PM

## 2016-09-02 ENCOUNTER — Encounter: Payer: Self-pay | Admitting: Physical Therapy

## 2016-09-02 ENCOUNTER — Ambulatory Visit: Payer: Medicare HMO | Admitting: Physical Therapy

## 2016-09-02 DIAGNOSIS — M6281 Muscle weakness (generalized): Secondary | ICD-10-CM

## 2016-09-02 DIAGNOSIS — R2681 Unsteadiness on feet: Secondary | ICD-10-CM

## 2016-09-02 DIAGNOSIS — I69354 Hemiplegia and hemiparesis following cerebral infarction affecting left non-dominant side: Secondary | ICD-10-CM | POA: Diagnosis not present

## 2016-09-02 DIAGNOSIS — R29898 Other symptoms and signs involving the musculoskeletal system: Secondary | ICD-10-CM

## 2016-09-02 DIAGNOSIS — M21372 Foot drop, left foot: Secondary | ICD-10-CM

## 2016-09-02 NOTE — Therapy (Signed)
[facility] [address] [place], [place], [address] Phone: [phone]   Fax:  [phone]  Physical Therapy Treatment  Patient Details  Name: Virginia Meyer MRN: [id number] Date of Birth: May 08, 1928 Referring Provider: Sarina Ill, MD  Encounter Date: 09/02/2016      PT End of Session - 09/02/16 1136    Visit Number 8   Number of Visits 17   Date for PT Re-Evaluation 09/17/16   Authorization Type MCR; G-Code   Authorization Time Period 07/20/16 to 09/17/16   PT Start Time 1018   PT Stop Time 1100   PT Time Calculation (min) 42 min   Equipment Utilized During Treatment Gait belt   Activity Tolerance Patient tolerated treatment well   Behavior During Therapy [facility] for tasks assessed/performed      Past Medical History:  Diagnosis Date  . Anxiety   . Fall   . GERD (gastroesophageal reflux disease)   . Heart failure ([place])   . HLD (hyperlipidemia)   . HTN (hypertension)   . Hyperlipidemia   . Hypotension   . Major depressive disorder   . Neuropathy   . PVD (peripheral vascular disease) ([place])   . Stroke [facility])    left sided deficit  . UTI (urinary tract infection)     Past Surgical History:  Procedure Laterality Date  . ABDOMINAL HYSTERECTOMY    . FOOT NEUROMA SURGERY    . SPINE SURGERY    . TONSILLECTOMY      There were no vitals filed for this visit.      Subjective Assessment - 09/02/16 1108    Subjective No falls reported. Patient c/o increased R neuropathic foot pain.  She reports increased tingling starting yesterday. She says she asked nursing for more gabapentin, but did not receive anything for it. She also reports she has been stretching some with her footstool in her room, but hasn't used it much for passive stretching during BINGO, etc.   Patient is accompained by: Family member   Pertinent History hospitalized with CHB 04/2016, afib, HTN, peripheral neuropathy, CHF, PVD, 05/27/15 large R  MCA CVA, lt foot drop due to back sx 2015   Limitations Standing;Walking   How long can you walk comfortably? unable; self-propels her w/c with bil LEs and RUE   Patient Stated Goals she wants to learn to stand and walk again.   Currently in Pain? Other (Comment)   Pain Location Foot   Pain Orientation Right   Pain Descriptors / Indicators Tingling   Pain Type Neuropathic pain   Pain Onset Yesterday   Pain Frequency Constant            [facility] PT Assessment - 09/02/16 1116      AROM   AROM Assessment Site Knee   Right Knee Extension -20   Left Knee Extension 41            [facility] Adult PT Treatment/Exercise - 09/02/16 1116      Transfers   Transfers Squat Pivot Transfers   Squat Pivot Transfers 3: Mod assist;With upper extremity assistance   Squat Pivot Transfer Details (indicate cue type and reason) Consistent mod verbal and tactile cues for sequencing and technique required to perform squat pivot transfers. Transfers to right are facilitated by use of R UE, transfers to left are more difficult due to decreased strength and control of L UE.  Performed 3 reps WC<>mat table.     Dynamic Sitting Balance   Dynamic Sitting -  Balance Support Feet supported;No upper extremity supported  Performed x1 seated in WC, x1 seated on edge of mat table   Dynamic Sitting - Level of Assistance 5: Stand by assistance   Reach (Patient is able to reach ___ inches to right, left, forward, back) 5   forward, both seated in WC and on edge of mat table   Dynamic Sitting - Balance Activities Forward lean/weight shifting     Exercises   Exercises Other Exercises   Other Exercises  Passive stretching B knee extensors/heel cords 30sec/3x each; heel slides x5 R & L;            PT Education - 09/02/16 1149    Education provided Yes   Education Details Discussed and clarified HEP, recommend use of patient's foam pillow under ankles when performing supine/long sit passive hamstring stretch,  clarified technique with HS   Person(s) Educated Patient   Methods Explanation;Demonstration   Comprehension Verbalized understanding;Returned demonstration;Need further instruction;Tactile cues required          PT Short Term Goals - 09/02/16 1244      PT SHORT TERM GOAL #1   Title Patient and caregiver/family will demonstrate independence with HEP for increasing ROM and strength for carryover into functional tasks. (TARGET for all STGs 08/20/16 **may need to be adjusted based on date of first treatment session available) New TARGET 09/02/16 due to delayed start.    Baseline Pt demonstrates adequate understanding of seated hamstring stretch seated on EOB with foot propped on stool; discussed use of her foam wedge at home under her ankles when performing supine or long sit version of passive hamstring stretch, needs further instruction and reinforcement   Period Weeks   Status On-going     PT SHORT TERM GOAL #2   Title Patient will increase bil Knee extension ROM by 15 degrees to improve patient's ability to progress to standing balance activities.    Baseline 09/02/16, R knee extension=-20 degrees (improved by 10 degrees), and L knee extension= -41degrees (improved by 7 degrees); progress noted but not at goal measurement yet   Time 4     PT SHORT TERM GOAL #3   Title Patient will transfer wheelchair to/from bed with no more than moderate assistance on 3 of 5 transfers with LRAD (as indicated).    Baseline 09/02/16: patient transfers WC<>mat table with mod assist for 3/3 transfers; however needs to increase independence with sequencing and active use of UE's to assist to meet goal   Period Weeks   Status On-going     PT SHORT TERM GOAL #4   Title Patient will demonstrate dynamic sitting balance by reaching >6 inches anteriorly while sitting on edge of mat table with supervision.    Baseline 09/02/16: reaches 5" anteriorally, improved but not at goal level yet   Time 4   Period Weeks    Status Partially Met     PT SHORT TERM GOAL #5   Title Patient will perform sit to stand in // bars with moderate assistance (pushing up from w/c, transitioning hands to //bars) and maintain standing x 90 seconds with minimal assistance.    Time 4   Period Weeks   Status On-going           PT Long Term Goals - 07/20/16 2110      PT LONG TERM GOAL #1   Title Patient and caregiver/family will demonstrate independence with updated HEP  (TARGET for all LTGs 09/17/16)   Time 8  Period Weeks   Status New     PT LONG TERM GOAL #2   Title Patient will transfer from wheelchair to/from bed with minimal assistance and LRAD on 4 of 5 transfers.   Time 8   Period Weeks   Status New     PT LONG TERM GOAL #3   Title Patient will perform sit to stand with LRAD with minimal assistance and maintain standing x 90 seconds with minimal assistance for weight-shifting/pre-gait activities.    Time 8   Period Weeks   Status New     PT LONG TERM GOAL #4   Title Patient will demonstrate improved dynamic sitting balance by reaching to her foot/shoe while sitting on edge of mat table independently.   Time 8   Period Weeks   Status New               Plan - 09/02/16 1204    Clinical Impression Statement Today's skilled session focused on checking STG's. Patient is making steady progress towards all STG's and is close to meeting goals for dynamic sitting balance, transfers to mat table using squat pivot transfer with mod assist, as well as performing sit<>stand in parallel bars maintaining standing for 90 seconds. Steady progress is noted with increasing bilateral knee extension as well, just not at goal level yet. Discussed importance of consistent performance of HEP to assist knee extension and facilitate weight bearing in standing. Patient will benefit from skilled PT to progress towards meeting unmet goals.   Clinical Impairments Affecting Rehab Potential duration >12 months since onset CVA;  left foot drop from back surgery prior to CVA; bil LE contractures; need for assistance to carryover HEP and other mobility education   PT Frequency 2x / week   PT Duration 8 weeks   PT Treatment/Interventions ADLs/Self Care Home Management;DME Instruction;Gait training;Functional mobility training;Therapeutic activities;Therapeutic exercise;Balance training;Neuromuscular re-education;Cognitive remediation;Patient/family education;Orthotic Fit/Training;Wheelchair mobility training;Manual techniques;Moist Heat;Ultrasound;Passive range of motion;Splinting;Taping   PT Next Visit Plan Continue to work on independence with sequencing in performance of squat pivot and sit to stand transfers, improving standing tolerance and dynamic sitting balance, and increasing bilateral LE ROM.    Consulted and Agree with Plan of Care Patient;Family member/caregiver   Family Member Consulted Caregiver      Patient will benefit from skilled therapeutic intervention in order to improve the following deficits and impairments:  Decreased balance, Decreased coordination, Decreased knowledge of use of DME, Decreased mobility, Decreased range of motion, Decreased strength, Difficulty walking, Impaired flexibility, Impaired sensation, Impaired UE functional use, Postural dysfunction  Visit Diagnosis: Foot drop, left  Hemiplegia and hemiparesis following cerebral infarction affecting left non-dominant side (HCC)  Muscle weakness (generalized)  Other symptoms and signs involving the musculoskeletal system  Unsteadiness on feet     Problem List Patient Active Problem List   Diagnosis Date Noted  . Carotid artery disease ([place]) 07/06/2016  . Gait abnormality 05/13/2016  . Cerebrovascular accident (CVA) due to thrombosis of right anterior cerebral artery ([place]) 05/13/2016  . Complete heart block ([place]) 05/07/2016  . Benign essential HTN 08/21/2015  . Acute CVA (cerebrovascular accident) ([place]) 08/04/2015  . Left  hemiparesis ([place]) 08/04/2015  . Primary osteoarthritis of left knee 08/04/2015  . Esophageal reflux 08/04/2015  . Hyperlipidemia LDL goal <70 08/04/2015  . Paroxysmal atrial fibrillation ([place]) 08/04/2015  . PVD (peripheral vascular disease) ([place]) 08/04/2015  . Protein-calorie malnutrition ([place]) 08/04/2015    Vaughan Sine 09/02/2016, 12:58 PM  [facility] [phone]  [place], [place], [address] Phone: [phone]   Fax:  [phone]  Name: Virginia Meyer MRN: [id number] Date of Birth: 1928/07/24

## 2016-09-07 ENCOUNTER — Ambulatory Visit: Payer: Medicare HMO | Admitting: Physical Therapy

## 2016-09-07 ENCOUNTER — Encounter: Payer: Self-pay | Admitting: Physical Therapy

## 2016-09-07 DIAGNOSIS — M6281 Muscle weakness (generalized): Secondary | ICD-10-CM

## 2016-09-07 DIAGNOSIS — I69354 Hemiplegia and hemiparesis following cerebral infarction affecting left non-dominant side: Secondary | ICD-10-CM

## 2016-09-07 DIAGNOSIS — R29898 Other symptoms and signs involving the musculoskeletal system: Secondary | ICD-10-CM

## 2016-09-07 DIAGNOSIS — M21372 Foot drop, left foot: Secondary | ICD-10-CM

## 2016-09-07 NOTE — Therapy (Signed)
[facility] [address] [place], [place], [address] Phone: [phone]   Fax:  [phone]  Physical Therapy Treatment  Patient Details  Name: Virginia Meyer MRN: [id number] Date of Birth: Nov 14, 1928 Referring Provider: Sarina Ill, MD  Encounter Date: 09/07/2016      PT End of Session - 09/07/16 1138    Visit Number 9   Number of Visits 17   Date for PT Re-Evaluation 09/17/16   Authorization Type MCR; G-Code   Authorization Time Period 07/20/16 to 09/17/16   PT Start Time 1017   PT Stop Time 1056   PT Time Calculation (min) 39 min      Past Medical History:  Diagnosis Date  . Anxiety   . Fall   . GERD (gastroesophageal reflux disease)   . Heart failure ([place])   . HLD (hyperlipidemia)   . HTN (hypertension)   . Hyperlipidemia   . Hypotension   . Major depressive disorder   . Neuropathy   . PVD (peripheral vascular disease) ([place])   . Stroke [facility])    left sided deficit  . UTI (urinary tract infection)     Past Surgical History:  Procedure Laterality Date  . ABDOMINAL HYSTERECTOMY    . FOOT NEUROMA SURGERY    . SPINE SURGERY    . TONSILLECTOMY      There were no vitals filed for this visit.      Subjective Assessment - 09/07/16 1023    Subjective No pain or falls reported. Patient reports the neuropathic pain in her R foot has subsided, but reports it comes and goes depending on the day. She reports she has not been able to use her folding footstool to perform her passive knee extension stretch due to lack of room under the tables. in activity rooms at her facility.   Patient is accompained by: Family member   Pertinent History hospitalized with CHB 04/2016, afib, HTN, peripheral neuropathy, CHF, PVD, 05/27/15 large R MCA CVA, lt foot drop due to back sx 2015   Limitations Standing;Walking   How long can you walk comfortably? unable; self-propels her w/c with bil LEs and RUE   Patient Stated Goals  she wants to learn to stand and walk again.   Currently in Pain? No/denies   Pain Score 0-No pain           [facility] Adult PT Treatment/Exercise - 09/07/16 0001      Transfers   Transfers Squat Pivot Transfers   Sit to Stand 3: Mod assist;4: Min assist   Sit to Stand Details Manual facilitation for weight shifting;Manual facilitation for placement;Manual facilitation for weight bearing;Verbal cues for sequencing;Verbal cues for technique;Verbal cues for precautions/safety   Sit to Stand Details (indicate cue type and reason) x3 reps inside parallel bars, Stance time for trials= 60 seconds, 120 seconds, 60 seconds, with verbal cues and manual facilitation for sequencing, weight bearing, and posture. Alternating UE reach performed x1 when increased knee pain prompted return to sit in WC. Improved overall endurance weight bearing in standing. Pt. requires consistent cueing and manual facilitation to perform sit<>stand transfers with success. Pt. demonstrated increased independence on third and last trial sit to stand requiring only min assist to stand.   Stand to Sit 3: Mod assist   Stand to Sit Details (indicate cue type and reason) Verbal cues for precautions/safety;Verbal cues for technique;Verbal cues for sequencing;Manual facilitation for weight shifting   Stand to Sit Details VC to remove hands from parallel  bars and to reach back with arms to control descent to [place] Transfers 3: Mod assist;4: Programmer, multimedia Details (indicate cue type and reason) Min assist from [facility] to mat table, mod assist from mat table to [facility] with verbal cues for technique and sequencing, manual facilitation of weight shifting   Lateral/Scoot Transfers 4: Min assist;4: Min guard   Lateral/Scoot Transfer Details (indicate cue type and reason) Pt. laterally scooted R<>L along length of mat table approximately 6 feet x 2 laps, VC's for technique and sequencing, Verbal, visual and tactile cues to  continue moving weaker R UE when scooting to L, when forgotten it gets trapped under RLE and inhibits lateral scoot. Verbal and tactile cues required to facilitate moving hips to L as well due to RLE weakness.                PT Education - 09/07/16 1134    Education provided Yes   Education Details Discussed performing passive hamstring stretch with distal end of LE propped on foam pillow with increased frequency and regularity in pt.'s room as she is having trouble finding room to use her footstool for stretching during activities outside her room. Discussed importance of consistency of stretching to improve knee extension to faciliate weight bearing.   Person(s) Educated Patient;Caregiver(s)   Methods Explanation;Demonstration   Comprehension Verbalized understanding;Need further instruction         PT Short Term Goals - 09/08/16 0940      PT SHORT TERM GOAL #1   Title Patient and caregiver/family will demonstrate independence with HEP for increasing ROM and strength for carryover into functional tasks. (TARGET for all STGs 08/20/16 **may need to be adjusted based on date of first treatment session available) New TARGET 09/02/16 due to delayed start.    Baseline Pt demonstrates adequate understanding of seated hamstring stretch seated on EOB with foot propped on stool; discussed use of her foam wedge at home under her ankles when performing supine or long sit version of passive hamstring stretch, needs further instruction and reinforcement   Status Partially Met     PT SHORT TERM GOAL #2   Title Patient will increase bil Knee extension ROM by 15 degrees to improve patient's ability to progress to standing balance activities.    Baseline 09/02/16, R knee extension=-20 degrees (improved by 10 degrees), and L knee extension= -41degrees (improved by 7 degrees); progress noted but not at goal measurement yet   Status Partially Met     PT SHORT TERM GOAL #3   Title Patient will transfer  wheelchair to/from bed with no more than moderate assistance on 3 of 5 transfers with LRAD (as indicated).    Baseline 09/02/16: patient transfers WC<>mat table with mod assist for 3/3 transfers; however needs to increase independence with sequencing and active use of UE's to assist to meet goal   Status Partially Met     PT SHORT TERM GOAL #4   Title Patient will demonstrate dynamic sitting balance by reaching >6 inches anteriorly while sitting on edge of mat table with supervision.    Baseline 09/02/16: reaches 5" anteriorally, improved but not at goal level yet   Status Partially Met     PT SHORT TERM GOAL #5   Title Patient will perform sit to stand in // bars with moderate assistance (pushing up from w/c, transitioning hands to //bars) and maintain standing x 90 seconds with minimal assistance.  Baseline 09/02/16: continues to need mod/max assist with cues to stand. unable to achieve consecutive 90 sec's with minimal assistance to maintain standing.   Status Not Met            PT Long Term Goals - 07/20/16 2110      PT LONG TERM GOAL #1   Title Patient and caregiver/family will demonstrate independence with updated HEP  (TARGET for all LTGs 09/17/16)   Time 8   Period Weeks   Status New     PT LONG TERM GOAL #2   Title Patient will transfer from wheelchair to/from bed with minimal assistance and LRAD on 4 of 5 transfers.   Time 8   Period Weeks   Status New     PT LONG TERM GOAL #3   Title Patient will perform sit to stand with LRAD with minimal assistance and maintain standing x 90 seconds with minimal assistance for weight-shifting/pre-gait activities.    Time 8   Period Weeks   Status New     PT LONG TERM GOAL #4   Title Patient will demonstrate improved dynamic sitting balance by reaching to her foot/shoe while sitting on edge of mat table independently.   Time 8   Period Weeks   Status New               Plan - 09/07/16 1153    Clinical Impression  Statement Today's skilled session focused on improving functional independence with transfers. Patient demonstrated improved independence with squat pivot transfers to R and with sit to stand within the parallel bars, performing each once with only min assist given consistent cueing for technique, sequencing, and manual facilitation of weight shifting. She was able to laterally scoot along edge of table with only min guard to R. Scooting to left requires increased cueing to facilitate movement of weaker R upper and LE. She also demonstrated improved endurance weight bearing standing inside parallel bars, standing up to 2 minutes before needing a rest break.  Patient is progressing towards goals and will benefit from continued skilled PT to progress towards unmet goals.   Rehab Potential Fair   Clinical Impairments Affecting Rehab Potential duration >12 months since onset CVA; left foot drop from back surgery prior to CVA; bil LE contractures; need for assistance to carryover HEP and other mobility education   PT Frequency 2x / week   PT Treatment/Interventions ADLs/Self Care Home Management;DME Instruction;Gait training;Functional mobility training;Therapeutic activities;Therapeutic exercise;Balance training;Neuromuscular re-education;Cognitive remediation;Patient/family education;Orthotic Fit/Training;Wheelchair mobility training;Manual techniques;Moist Heat;Ultrasound;Passive range of motion;Splinting;Taping   PT Next Visit Plan G- code due next visit; Continue to work on independence with sequencing in performance of squat pivot and sit to stand transfers, improving standing tolerance and dynamic sitting balance, and increasing bilateral LE ROM.    Consulted and Agree with Plan of Care Family member/caregiver;Patient      Patient will benefit from skilled therapeutic intervention in order to improve the following deficits and impairments:  Decreased balance, Decreased coordination, Decreased knowledge  of use of DME, Decreased mobility, Decreased range of motion, Decreased strength, Difficulty walking, Impaired flexibility, Impaired sensation, Impaired UE functional use, Postural dysfunction  Visit Diagnosis: Foot drop, left  Hemiplegia and hemiparesis following cerebral infarction affecting left non-dominant side (HCC)  Muscle weakness (generalized)  Other symptoms and signs involving the musculoskeletal system     Problem List Patient Active Problem List   Diagnosis Date Noted  . Carotid artery disease ([place]) 07/06/2016  . Gait abnormality 05/13/2016  .  Cerebrovascular accident (CVA) due to thrombosis of right anterior cerebral artery ([place]) 05/13/2016  . Complete heart block ([place]) 05/07/2016  . Benign essential HTN 08/21/2015  . Acute CVA (cerebrovascular accident) ([place]) 08/04/2015  . Left hemiparesis ([place]) 08/04/2015  . Primary osteoarthritis of left knee 08/04/2015  . Esophageal reflux 08/04/2015  . Hyperlipidemia LDL goal <70 08/04/2015  . Paroxysmal atrial fibrillation ([place]) 08/04/2015  . PVD (peripheral vascular disease) ([place]) 08/04/2015  . Protein-calorie malnutrition ([place]) 08/04/2015    Rulon Eisenmenger, SPTA 09/07/2016, 11:59 AM  [facility] [address] [place], [place], [address] Phone: [phone]   Fax:  [phone]  Name: Shaliyah Taite MRN: [id number] Date of Birth: 1929-01-07  This note has been reviewed and edited by supervising CI.  Willow Ora, PTA, [facility] [address] [place], [place] [address] [phone] 09/08/16, 9:43 AM

## 2016-09-09 ENCOUNTER — Encounter: Payer: Self-pay | Admitting: Physical Therapy

## 2016-09-09 ENCOUNTER — Ambulatory Visit: Payer: Medicare HMO | Admitting: Physical Therapy

## 2016-09-09 DIAGNOSIS — I69354 Hemiplegia and hemiparesis following cerebral infarction affecting left non-dominant side: Secondary | ICD-10-CM | POA: Diagnosis not present

## 2016-09-09 DIAGNOSIS — M21372 Foot drop, left foot: Secondary | ICD-10-CM

## 2016-09-09 DIAGNOSIS — M6281 Muscle weakness (generalized): Secondary | ICD-10-CM

## 2016-09-09 DIAGNOSIS — R29898 Other symptoms and signs involving the musculoskeletal system: Secondary | ICD-10-CM

## 2016-09-09 NOTE — Therapy (Addendum)
[facility] [address] [place], [place], [address] Phone: [phone]   Fax:  [phone]  Physical Therapy Treatment  Patient Details  Name: Virginia Meyer MRN: [id number] Date of Birth: 28-Apr-1928 Referring Provider: Sarina Ill, MD  Encounter Date: 09/09/2016      PT End of Session - 09/09/16 1022    Visit Number 10   Number of Visits 17   Date for PT Re-Evaluation 09/17/16   Authorization Type MCR; G-Code   Authorization Time Period 07/20/16 to 09/17/16   PT Start Time 0937   PT Stop Time 1014   PT Time Calculation (min) 37 min   Equipment Utilized During Treatment Gait belt   Activity Tolerance Patient tolerated treatment well   Behavior During Therapy [facility] for tasks assessed/performed      Past Medical History:  Diagnosis Date  . Anxiety   . Fall   . GERD (gastroesophageal reflux disease)   . Heart failure ([place])   . HLD (hyperlipidemia)   . HTN (hypertension)   . Hyperlipidemia   . Hypotension   . Major depressive disorder   . Neuropathy   . PVD (peripheral vascular disease) ([place])   . Stroke [facility])    left sided deficit  . UTI (urinary tract infection)     Past Surgical History:  Procedure Laterality Date  . ABDOMINAL HYSTERECTOMY    . FOOT NEUROMA SURGERY    . SPINE SURGERY    . TONSILLECTOMY      There were no vitals filed for this visit.      Subjective Assessment - 09/09/16 1018    Subjective No pain, falls, or complaints reported. She said she was able to stretch her L leg propped on the footstool outside yesterday. She said she hasn't used the foam pillow to prop her legs for stretching because she hasn't been able to find it.   Patient is accompained by: Family member   Pertinent History hospitalized with CHB 04/2016, afib, HTN, peripheral neuropathy, CHF, PVD, 05/27/15 large R MCA CVA, lt foot drop due to back sx 2015   Limitations Standing;Walking   How long can you walk  comfortably? unable; self-propels her w/c with bil LEs and RUE   Patient Stated Goals she wants to learn to stand and walk again.   Currently in Pain? No/denies   Pain Score 0-No pain            [facility] PT Assessment - 09/09/16 1027      AROM   AROM Assessment Site Knee   Right Knee Extension -17   Left Knee Extension -39         [facility] Adult PT Treatment/Exercise - 09/09/16 1027      Transfers   Transfers Squat Pivot Transfers   Sit to Stand 3: Mod assist;With upper extremity assist;4: Min assist   Sit to Stand Details Verbal cues for technique;Verbal cues for sequencing;Verbal cues for precautions/safety;Manual facilitation for weight shifting;Manual facilitation for placement   Sit to Stand Details (indicate cue type and reason) X2 reps inside parallel bars (1 rep with mod assist, 1 rep with min assist), Stance time for trials= 120 seconds, 90 seconds, with verbal cues and manual facilitation for sequencing, weight bearing, and posture. L UE reach performed x4, R UE reach x1 resulted in LOB requiring manual assist back to center. Improved independence transferring sit<>stand within parallel bars, requiring lesser level assist.  Consistent verbal and tactile cues and manual facilitation  requiring for sequencing  and weight shift.   Stand to Sit 4: Min assist;3: Mod assist   Stand to Sit Details (indicate cue type and reason) Verbal cues for precautions/safety;Verbal cues for technique;Verbal cues for sequencing;Manual facilitation for placement   Stand to Sit Details min VC to reach hands back and control descent to [facility]   Squat Pivot Transfers 4: Min assist   Squat Pivot Transfer Details (indicate cue type and reason) Pt. demonstrated improved independence requiring less assistance with transfer today. Noted improved independence moving RUE to assist transfer. VC's required for weight shifting.   Lateral/Scoot Transfers 4: Min guard   Research officer, trade union Details (indicate cue type  and reason) Pt. laterally scooted R<>L along length of mat table approximately 6 feet x 2 laps, demonstrated improved independence with technique, intermittent VC required for weight shifting and technique. Independently moved L UE to assist lateral transfer when scooting today, L hand did not get trapped under her L thigh scooting to the L but remained at a safe and functional distance to assist transfer.     Dynamic Sitting Balance   Dynamic Sitting - Balance Support Feet supported   Dynamic Sitting - Level of Assistance 5: Stand by assistance   Dynamic Sitting - Balance Activities Lateral lean/weight shifting;Reaching for objects   Sitting balance - Comments Compliant surface: seated on Airex pad, performed anterior, superior/inferior, lateral reaches x10; performed alternating lateral elbow tap x5 R/L , performed all independently with no LOB, estimate pt. demonstrated approximately 5-6 inch anterior reach.                PT Education - 09/09/16 1021    Education provided Yes   Education Details Discussed finding the foam pillow to aid stretching in the bed and the importance of consistency to improve knee extension.   Person(s) Educated Patient;Caregiver(s)   Methods Explanation   Comprehension Verbalized understanding          PT Short Term Goals - 09/08/16 0940      PT SHORT TERM GOAL #1   Title Patient and caregiver/family will demonstrate independence with HEP for increasing ROM and strength for carryover into functional tasks. (TARGET for all STGs 08/20/16 **may need to be adjusted based on date of first treatment session available) New TARGET 09/02/16 due to delayed start.    Baseline Pt demonstrates adequate understanding of seated hamstring stretch seated on EOB with foot propped on stool; discussed use of her foam wedge at home under her ankles when performing supine or long sit version of passive hamstring stretch, needs further instruction and reinforcement   Status  Partially Met     PT SHORT TERM GOAL #2   Title Patient will increase bil Knee extension ROM by 15 degrees to improve patient's ability to progress to standing balance activities.    Baseline 09/02/16, R knee extension=-20 degrees (improved by 10 degrees), and L knee extension= -41degrees (improved by 7 degrees); progress noted but not at goal measurement yet   Status Partially Met     PT SHORT TERM GOAL #3   Title Patient will transfer wheelchair to/from bed with no more than moderate assistance on 3 of 5 transfers with LRAD (as indicated).    Baseline 09/02/16: patient transfers WC<>mat table with mod assist for 3/3 transfers; however needs to increase independence with sequencing and active use of UE's to assist to meet goal   Status Partially Met     PT SHORT TERM GOAL #4   Title Patient will demonstrate dynamic  sitting balance by reaching >6 inches anteriorly while sitting on edge of mat table with supervision.    Baseline 09/02/16: reaches 5" anteriorally, improved but not at goal level yet   Status Partially Met     PT SHORT TERM GOAL #5   Title Patient will perform sit to stand in // bars with moderate assistance (pushing up from w/c, transitioning hands to //bars) and maintain standing x 90 seconds with minimal assistance.    Baseline 09/02/16: continues to need mod/max assist with cues to stand. unable to achieve consecutive 90 sec's with minimal assistance to maintain standing.   Status Not Met           PT Long Term Goals - 07/20/16 2110      PT LONG TERM GOAL #1   Title Patient and caregiver/family will demonstrate independence with updated HEP  (TARGET for all LTGs 09/17/16)   Time 8   Period Weeks   Status New     PT LONG TERM GOAL #2   Title Patient will transfer from wheelchair to/from bed with minimal assistance and LRAD on 4 of 5 transfers.   Time 8   Period Weeks   Status New     PT LONG TERM GOAL #3   Title Patient will perform sit to stand with LRAD with  minimal assistance and maintain standing x 90 seconds with minimal assistance for weight-shifting/pre-gait activities.    Time 8   Period Weeks   Status New     PT LONG TERM GOAL #4   Title Patient will demonstrate improved dynamic sitting balance by reaching to her foot/shoe while sitting on edge of mat table independently.   Time 8   Period Weeks   Status New               Plan - 09/09/16 1245    Clinical Impression Statement Today's skilled session focused on increasing functional mobility with lateral scoot, squat-pivot and sit<>stand transfers. Given consistent verbal and tactile cues and manual facilitation for sequencing and weight shift, pt. demonstrated improved independence with all transfers, performing at least one trial of all listed with min assist. Noted improved awareness of position and active use of L UE when scooting laterally. Bilateral knee extension also mildly improved since last week, R=-17 degrees and L=-39 degrees. Patient is progressing well towards goals and will benefit from continued skilled PT to progress towards unmet goals.   Rehab Potential Fair   Clinical Impairments Affecting Rehab Potential duration >12 months since onset CVA; left foot drop from back surgery prior to CVA; bil LE contractures; need for assistance to carryover HEP and other mobility education   PT Treatment/Interventions ADLs/Self Care Home Management;DME Instruction;Gait training;Functional mobility training;Therapeutic activities;Therapeutic exercise;Balance training;Neuromuscular re-education;Cognitive remediation;Patient/family education;Orthotic Fit/Training;Wheelchair mobility training;Manual techniques;Moist Heat;Ultrasound;Passive range of motion;Splinting;Taping   PT Next Visit Plan Continue to work on independence with sequencing in performance of squat pivot and sit to stand transfers, improving standing tolerance and dynamic sitting balance, and increasing bilateral LE ROM.     Consulted and Agree with Plan of Care Patient;Family member/caregiver      Patient will benefit from skilled therapeutic intervention in order to improve the following deficits and impairments:  Decreased balance, Decreased coordination, Decreased knowledge of use of DME, Decreased mobility, Decreased range of motion, Decreased strength, Difficulty walking, Impaired flexibility, Impaired sensation, Impaired UE functional use, Postural dysfunction  Visit Diagnosis: Foot drop, left  Hemiplegia and hemiparesis following cerebral infarction affecting left non-dominant side (HCC)  Muscle weakness (generalized)  Other symptoms and signs involving the musculoskeletal system       G-Codes - Sep 11, 2016 1025    Functional Assessment Tool Used (Outpatient Only) mod assistance to stand limited in part due to L knee flexion contracture of 39 degrees   Functional Limitation Mobility: Walking and moving around      Problem List Patient Active Problem List   Diagnosis Date Noted  . Carotid artery disease ([place]) 07/06/2016  . Gait abnormality 05/13/2016  . Cerebrovascular accident (CVA) due to thrombosis of right anterior cerebral artery ([place]) 05/13/2016  . Complete heart block ([place]) 05/07/2016  . Benign essential HTN 08/21/2015  . Acute CVA (cerebrovascular accident) ([place]) 08/04/2015  . Left hemiparesis ([place]) 08/04/2015  . Primary osteoarthritis of left knee 08/04/2015  . Esophageal reflux 08/04/2015  . Hyperlipidemia LDL goal <70 08/04/2015  . Paroxysmal atrial fibrillation ([place]) 08/04/2015  . PVD (peripheral vascular disease) ([place]) 08/04/2015  . Protein-calorie malnutrition ([place]) 08/04/2015    Vaughan Sine 09/11/16, 12:57 PM       G-Codes - 13-Sep-2016 1049    Functional Assessment Tool Used (Outpatient Only) mod assistance to stand limited in part due to L knee flexion contracture of 39 degrees   Functional Limitation Mobility: Walking and moving around   Mobility: Walking and  Moving Around Current Status [phone]) At least 60 percent but less than 80 percent impaired, limited or restricted   Mobility: Walking and Moving Around Goal Status [phone]) At least 40 percent but less than 60 percent impaired, limited or restricted     Physical Therapy Progress Note  Dates of Reporting Period: 07/20/16 to 09/11/2016   Objective Measurements: see above; knee contracture down to 39 degrees  Goal Update: NA  Plan: Continue as above  Reason Skilled Services are Required: Patient has shown improvements with mobility, however remains a high fall risk and shows potential for continued progress.  Barry Brunner, [facility] [address] [place], [place], [address] Phone: [phone]   Fax:  [phone]  Name: Virginia Meyer MRN: [id number] Date of Birth: 1928/03/18

## 2016-09-11 DIAGNOSIS — I69354 Hemiplegia and hemiparesis following cerebral infarction affecting left non-dominant side: Secondary | ICD-10-CM | POA: Diagnosis not present

## 2016-09-14 ENCOUNTER — Ambulatory Visit: Payer: Medicare HMO | Admitting: Physical Therapy

## 2016-09-14 DIAGNOSIS — R2681 Unsteadiness on feet: Secondary | ICD-10-CM

## 2016-09-14 DIAGNOSIS — R29898 Other symptoms and signs involving the musculoskeletal system: Secondary | ICD-10-CM

## 2016-09-14 DIAGNOSIS — I69354 Hemiplegia and hemiparesis following cerebral infarction affecting left non-dominant side: Secondary | ICD-10-CM | POA: Diagnosis not present

## 2016-09-14 DIAGNOSIS — M6281 Muscle weakness (generalized): Secondary | ICD-10-CM

## 2016-09-14 NOTE — Therapy (Signed)
[facility] [address] [place], [place], [address] Phone: [phone]   Fax:  [phone]  Physical Therapy Treatment  Patient Details  Name: Virginia Meyer MRN: [id number] Date of Birth: [birth year] Referring Provider: Sarina Ill, MD  Encounter Date: 09/14/2016      PT End of Session - 09/14/16 2108    Visit Number 11   Number of Visits 17   Date for PT Re-Evaluation 09/17/16   Authorization Type MCR; G-Code   Authorization Time Period 07/20/16 to 09/17/16   PT Start Time 1018   PT Stop Time 1100   PT Time Calculation (min) 42 min   Equipment Utilized During Treatment Gait belt   Activity Tolerance Patient tolerated treatment well   Behavior During Therapy [facility] for tasks assessed/performed      Past Medical History:  Diagnosis Date  . Anxiety   . Fall   . GERD (gastroesophageal reflux disease)   . Heart failure ([place])   . HLD (hyperlipidemia)   . HTN (hypertension)   . Hyperlipidemia   . Hypotension   . Major depressive disorder   . Neuropathy   . PVD (peripheral vascular disease) ([place])   . Stroke [facility])    left sided deficit  . UTI (urinary tract infection)     Past Surgical History:  Procedure Laterality Date  . ABDOMINAL HYSTERECTOMY    . FOOT NEUROMA SURGERY    . SPINE SURGERY    . TONSILLECTOMY      There were no vitals filed for this visit.      Subjective Assessment - 09/14/16 1022    Subjective No changes, no falls.    Patient is accompained by: Family member   Pertinent History hospitalized with CHB 04/2016, afib, HTN, peripheral neuropathy, CHF, PVD, 05/27/15 large R MCA CVA, lt foot drop due to back sx 2015   Limitations Standing;Walking   How long can you walk comfortably? unable; self-propels her w/c with bil LEs and RUE   Patient Stated Goals she wants to learn to stand and walk again.   Currently in Pain? No/denies                         [facility] Adult PT  Treatment/Exercise - 09/14/16 2101      Transfers   Transfers Sit to Stand;Stand to Sit;Squat Pivot Transfers   Sit to Stand 3: Mod assist;With upper extremity assist   Sit to Stand Details (indicate cue type and reason) x2 from w/c to // bars; primarily assisting to shift anteriorly due to posterior lean/bias; vc for sequencing   Stand to Sit 4: Min assist   Squat Pivot Transfers 3: Mod assist;4: Min Psychiatric nurse Details (indicate cue type and reason) w/c to bed on pt's rt with mod assist; bed to w/c reaching across to use armrest with min assist   Lateral/Scoot Transfer Details (indicate cue type and reason) attempted w/c to mat table at end of session and pt too fatigued      Static Standing Balance   Static Standing - Balance Support Right upper extremity supported;Left upper extremity supported  intermittent no UE support for up to 3 seconds   Static Standing - Level of Assistance 4: Min assist   Static Standing - Comment/# of Minutes both feet on the floor x 4 minutes with alternating single UE support; with LLE on 3" block able to stand for up to 3 seconds without  UE support     Ankle Exercises: Aerobic   Stationary Bike restorator seated in wheelchair x 4 minutes total; assist to keep lt foot on pedal                PT Education - 09/14/16 2108    Education provided Yes   Education Details reinforced importance of continued knee stretching (pt reports now using pillow when in bed and stool when sitting)   Person(s) Educated Patient   Methods Explanation   Comprehension Verbalized understanding          PT Short Term Goals - 09/08/16 0940      PT SHORT TERM GOAL #1   Title Patient and caregiver/family will demonstrate independence with HEP for increasing ROM and strength for carryover into functional tasks. (TARGET for all STGs 08/20/16 **may need to be adjusted based on date of first treatment session available) New TARGET 09/02/16 due to delayed  start.    Baseline Pt demonstrates adequate understanding of seated hamstring stretch seated on EOB with foot propped on stool; discussed use of her foam wedge at home under her ankles when performing supine or long sit version of passive hamstring stretch, needs further instruction and reinforcement   Status Partially Met     PT SHORT TERM GOAL #2   Title Patient will increase bil Knee extension ROM by 15 degrees to improve patient's ability to progress to standing balance activities.    Baseline 09/02/16, R knee extension=-20 degrees (improved by 10 degrees), and L knee extension= -41degrees (improved by 7 degrees); progress noted but not at goal measurement yet   Status Partially Met     PT SHORT TERM GOAL #3   Title Patient will transfer wheelchair to/from bed with no more than moderate assistance on 3 of 5 transfers with LRAD (as indicated).    Baseline 09/02/16: patient transfers WC<>mat table with mod assist for 3/3 transfers; however needs to increase independence with sequencing and active use of UE's to assist to meet goal   Status Partially Met     PT SHORT TERM GOAL #4   Title Patient will demonstrate dynamic sitting balance by reaching >6 inches anteriorly while sitting on edge of mat table with supervision.    Baseline 09/02/16: reaches 5" anteriorally, improved but not at goal level yet   Status Partially Met     PT SHORT TERM GOAL #5   Title Patient will perform sit to stand in // bars with moderate assistance (pushing up from w/c, transitioning hands to //bars) and maintain standing x 90 seconds with minimal assistance.    Baseline 09/02/16: continues to need mod/max assist with cues to stand. unable to achieve consecutive 90 sec's with minimal assistance to maintain standing.   Status Not Met           PT Long Term Goals - 07/20/16 2110      PT LONG TERM GOAL #1   Title Patient and caregiver/family will demonstrate independence with updated HEP  (TARGET for all LTGs  09/17/16)   Time 8   Period Weeks   Status New     PT LONG TERM GOAL #2   Title Patient will transfer from wheelchair to/from bed with minimal assistance and LRAD on 4 of 5 transfers.   Time 8   Period Weeks   Status New     PT LONG TERM GOAL #3   Title Patient will perform sit to stand with LRAD with minimal assistance and maintain  standing x 90 seconds with minimal assistance for weight-shifting/pre-gait activities.    Time 8   Period Weeks   Status New     PT LONG TERM GOAL #4   Title Patient will demonstrate improved dynamic sitting balance by reaching to her foot/shoe while sitting on edge of mat table independently.   Time 8   Period Weeks   Status New               Plan - 09/14/16 2112    Clinical Impression Statement Session focused on improving transfers (sit to stand and squat-pivot), LE strengthening, and standing balance. Patient reports she is now doing her knee stretches both in her w/c with her stool and in bed with bolster/pillow. Will assess progress towards LTGs next visit and plan to recertify for an additional 8 weeks as pt has made steady progress.    Rehab Potential Fair   Clinical Impairments Affecting Rehab Potential duration >12 months since onset CVA; left foot drop from back surgery prior to CVA; bil LE contractures; need for assistance to carryover HEP and other mobility education   PT Treatment/Interventions ADLs/Self Care Home Management;DME Instruction;Gait training;Functional mobility training;Therapeutic activities;Therapeutic exercise;Balance training;Neuromuscular re-education;Cognitive remediation;Patient/family education;Orthotic Fit/Training;Wheelchair mobility training;Manual techniques;Moist Heat;Ultrasound;Passive range of motion;Splinting;Taping   PT Next Visit Plan check LTGs and do recert; Continue to work on independence with sequencing in performance of squat pivot and sit to stand transfers, improving standing tolerance and dynamic  sitting balance, and increasing bilateral LE ROM.    Consulted and Agree with Plan of Care Patient;Family member/caregiver      Patient will benefit from skilled therapeutic intervention in order to improve the following deficits and impairments:  Decreased balance, Decreased coordination, Decreased knowledge of use of DME, Decreased mobility, Decreased range of motion, Decreased strength, Difficulty walking, Impaired flexibility, Impaired sensation, Impaired UE functional use, Postural dysfunction  Visit Diagnosis: Muscle weakness (generalized)  Other symptoms and signs involving the musculoskeletal system  Unsteadiness on feet     Problem List Patient Active Problem List   Diagnosis Date Noted  . Carotid artery disease ([place]) 07/06/2016  . Gait abnormality 05/13/2016  . Cerebrovascular accident (CVA) due to thrombosis of right anterior cerebral artery ([place]) 05/13/2016  . Complete heart block ([place]) 05/07/2016  . Benign essential HTN 08/21/2015  . Acute CVA (cerebrovascular accident) ([place]) 08/04/2015  . Left hemiparesis ([place]) 08/04/2015  . Primary osteoarthritis of left knee 08/04/2015  . Esophageal reflux 08/04/2015  . Hyperlipidemia LDL goal <70 08/04/2015  . Paroxysmal atrial fibrillation ([place]) 08/04/2015  . PVD (peripheral vascular disease) ([place]) 08/04/2015  . Protein-calorie malnutrition ([place]) 08/04/2015    Rexanne Mano, PT 09/14/2016, 9:16 PM  [facility] [address] [place], [place], [address] Phone: [phone]   Fax:  [phone]  Name: Virginia Meyer MRN: [id number] Date of Birth: 1928/04/26

## 2016-09-16 ENCOUNTER — Ambulatory Visit: Payer: Medicare HMO | Admitting: Physical Therapy

## 2016-09-16 DIAGNOSIS — M6281 Muscle weakness (generalized): Secondary | ICD-10-CM

## 2016-09-16 DIAGNOSIS — I69354 Hemiplegia and hemiparesis following cerebral infarction affecting left non-dominant side: Secondary | ICD-10-CM | POA: Diagnosis not present

## 2016-09-16 DIAGNOSIS — R2681 Unsteadiness on feet: Secondary | ICD-10-CM

## 2016-09-16 DIAGNOSIS — M21372 Foot drop, left foot: Secondary | ICD-10-CM

## 2016-09-16 DIAGNOSIS — R29898 Other symptoms and signs involving the musculoskeletal system: Secondary | ICD-10-CM

## 2016-09-16 NOTE — Patient Instructions (Addendum)
Steps to help Virginia Meyer transfer in/out of wheelchair:  1. Scoot hips out to edge of seat/bed. The hip closest to the bed/toilet/wheelchair you are transferring to should be a little more forward than the other. (Ex: going to her right, her right hip should be forward). This helps to make part of the turn before you even stand.   2. Check your feet. Place foot closest to the bed/toilet/wheelchair slightly more forward than the other foot and be sure they are about shoulder width apart.   3. Make sure your hands are on the armrests to push.   4. Lean FORWARD! Virginia Meyer needs to lean forward Maxwell as she comes up to stand and try to get her weight forward on her feet so she can pivot her foot (and not twist her knee or ankle as she turns.   5. Try to stand as tall as possible with weight on front of foot.   6. Pivot/turn and sit. Caregiver can use your foot along the inside of Virginia Meyer's foot to help it turn as she pivots (if you can stand that way).

## 2016-09-16 NOTE — Therapy (Signed)
[facility] [address] [place], [place], [address] Phone: [phone]   Fax:  [phone]  Physical Therapy Treatment  Patient Details  Name: Virginia Meyer MRN: [id number] Date of Birth: [birth year] Referring Provider: Sarina Ill, MD  Encounter Date: 09/16/2016      PT End of Session - 09/16/16 1922    Visit Number 12  G2   Number of Visits 33  recert 6/96   Date for PT Re-Evaluation 11/15/16   Authorization Type MCR; G-Code   Authorization Time Period 07/20/16 to 09/17/16; 09/16/16 to 11/15/16   PT Start Time 1017   PT Stop Time 1046  ended early for pt to go to restroom   PT Time Calculation (min) 29 min   Equipment Utilized During Treatment Gait belt   Activity Tolerance Patient tolerated treatment well   Behavior During Therapy [facility] for tasks assessed/performed      Past Medical History:  Diagnosis Date  . Anxiety   . Fall   . GERD (gastroesophageal reflux disease)   . Heart failure ([place])   . HLD (hyperlipidemia)   . HTN (hypertension)   . Hyperlipidemia   . Hypotension   . Major depressive disorder   . Neuropathy   . PVD (peripheral vascular disease) ([place])   . Stroke [facility])    left sided deficit  . UTI (urinary tract infection)     Past Surgical History:  Procedure Laterality Date  . ABDOMINAL HYSTERECTOMY    . FOOT NEUROMA SURGERY    . SPINE SURGERY    . TONSILLECTOMY      There were no vitals filed for this visit.      Subjective Assessment - 09/16/16 1025    Subjective No changes, no falls. Son added frame under her mattress and boxspring (they have been on the floor) and now bed is too high. (Pt told he may want to look into a 1/2 boxspring (1/2 as tall as normal one).    Patient is accompained by: Aide   Pertinent History hospitalized with CHB 04/2016, afib, HTN, peripheral neuropathy, CHF, PVD, 05/27/15 large R MCA CVA, lt foot drop due to back sx 2015   Limitations  Standing;Walking   How long can you walk comfortably? unable; self-propels her w/c with bil LEs and RUE   Patient Stated Goals she wants to learn to stand and walk again.   Currently in Pain? No/denies                         [facility] Adult PT Treatment/Exercise - 09/16/16 1913      Transfers   Transfers Stand Financial risk analyst Transfers 3: Mod assist;With armrests   Stand Pivot Transfer Details (indicate cue type and reason) pt requires max cues and incr time to shift forward over her feet as initiating sit to stand; she still uses some posterior lean/extension as coming to stand; PT supporting lead knee and foot between her feet to help her lead foot pivot   Squat Pivot Transfers 3: Mod assist;With upper extremity assistance   Squat Pivot Transfer Details (indicate cue type and reason) similar to standpivot with extension instead of forward weight-shift; unable to pivot her feet as her weight remains back on her heels;    Number of Reps Other reps (comment)  7; including pivot to toilet with grab bar mod assist   Transfer Cueing Patient continues to need cues for each step  of transfer (attempted to have her self-direct the transfer and teach me how to help her). Wrote out steps of transfer for her to share with the aides that assist her so they will know what we are working on. Encouraged her to have aides write in/add any suggestions that they find work for her.    Comments pt's aide assisted her off toilet (not observed)     Therapeutic Activites    Therapeutic Activities Other Therapeutic Activities   Other Therapeutic Activities seated on mat, large blue phsioball in front on the floor with pt pushing/rolling ball as far forward as she can to facilitate forward lean/wt-shift with expected results; exchanged physioball for chair with pt placing her hands in the seat of the chair and shifting forward onto her feet and hands to lift hips off the  mat table x 5 each                PT Education - 09/16/16 1922    Education provided Yes   Education Details steps/sequence to transfer   [employer]) Educated Patient;Caregiver(s)   Methods Explanation;Demonstration;Tactile cues;Verbal cues;Handout   Comprehension Verbalized understanding;Verbal cues required;Tactile cues required;Need further instruction          PT Short Term Goals - 09/16/16 1930      PT SHORT TERM GOAL #1   Title Patient will increase rt knee extension to -10 degrees and Lt knee extension to -32 degrees to improve patient's ability to stand and work towards ambulation.    Time 4   Period Weeks   Status New   Target Date 10/16/16     PT SHORT TERM GOAL #2   Title Patient will transfer wheelchair to/from bed with minimal assistance on 3 of 4 attempts with LRAD (as indicated).    Time 4   Period Weeks   Status New   Target Date 10/16/16     PT SHORT TERM GOAL #3   Title Patient will stand with left foot on 2-3" step and perform reaching tasks with single UE support for 6 minutes (to increase strength/endurance with ultimate goal to progress to ambulation).    Time 4   Period Weeks   Status New   Target Date 10/16/16           PT Long Term Goals - 09/16/16 1945      PT LONG TERM GOAL #1   Title Patient and caregiver/family will demonstrate independence with updated HEP     Time 8   Period Weeks   Status Unable to assess  due to lack of time; will be ongoing goal     PT LONG TERM GOAL #2   Title Patient will transfer from wheelchair to/from bed with minimal assistance and LRAD on 4 of 5 transfers.   Time 8   Period Weeks   Status Not Met     PT LONG TERM GOAL #3   Title Patient will perform sit to stand with LRAD with minimal assistance and maintain standing x 90 seconds with minimal assistance for weight-shifting/pre-gait activities.    Baseline 09/16/16  Min assist to // bars, standing max of 4 minutes with reaching tasks for balance    Time 8   Period Weeks   Status Achieved     PT LONG TERM GOAL #4   Title Patient will demonstrate improved dynamic sitting balance by reaching to her foot/shoe while sitting on edge of mat table independently.   Time 8   Period Weeks  Status Unable to assess  due to lack of time; shortened session going to restroom     PT LONG TERM GOAL #5   Title Patient will have a method to educate her caregivers at the ALF on the best method to help her transfer (ex. laminated sheet).    Baseline Patient has been unable to remember sequence for transfer.    Time 8   Period Weeks   Status New   Target Date 11/15/16     Additional Long Term Goals   Additional Long Term Goals Yes     PT LONG TERM GOAL #6   Title Patient will stand with LRAD with min assist.    Time 8   Period Weeks   Status New   Target Date 11/15/16     PT LONG TERM GOAL #7   Title Patient will perform pre-gait/balance activities x 5 minutes (no step under left foot) with LRAD.    Time 8   Period Weeks   Status New   Target Date 11/15/16     PT LONG TERM GOAL #8   Title Patient will be independent with her updated HEP for stretching and strengthening using handout.    Time 8   Period Weeks   Status New   Target Date 11/15/16               Plan - 09/16/16 2008    Clinical Impression Statement Session including checking LTGs with pt meeting 1 of 4 goals, 1 goal partially met, and 2 goals unable to assess due to shortened treatment for pt to use restroom. Patient has made good progress with increasing her knee extension and directly related standing tolerance. She has thus far been unable to recall the steps to her transfers and therefore began to construct a method for patient to recall steps and/or educate the staff at her ALF. Anticipate patient will continue to make slow, steady progress with continued PT.    Rehab Potential Fair   Clinical Impairments Affecting Rehab Potential duration >12 months since  onset CVA; left foot drop from back surgery prior to CVA; bil LE contractures; need for assistance to carryover HEP and other mobility education   PT Frequency 2x / week   PT Duration 8 weeks   PT Treatment/Interventions ADLs/Self Care Home Management;DME Instruction;Gait training;Functional mobility training;Therapeutic activities;Therapeutic exercise;Balance training;Neuromuscular re-education;Cognitive remediation;Patient/family education;Orthotic Fit/Training;Wheelchair mobility training;Manual techniques;Moist Heat;Ultrasound;Passive range of motion;Splinting;Taping   PT Next Visit Plan See if pt brought back any feedback re: transfers from the aides at her ALF; Continue to work on independence with sequencing in performance of squat pivot and sit to stand transfers (can she use written instructions created 7/26?;, improving standing tolerance and increasing bilateral LE ROM.    Consulted and Agree with Plan of Care Patient;Family member/caregiver   Family Member Consulted Caregiver      Patient will benefit from skilled therapeutic intervention in order to improve the following deficits and impairments:  Decreased balance, Decreased coordination, Decreased knowledge of use of DME, Decreased mobility, Decreased range of motion, Decreased strength, Difficulty walking, Impaired flexibility, Impaired sensation, Impaired UE functional use, Postural dysfunction, Decreased cognition  Visit Diagnosis: Muscle weakness (generalized) - Plan: PT plan of care cert/re-cert  Unsteadiness on feet - Plan: PT plan of care cert/re-cert  Other symptoms and signs involving the musculoskeletal system - Plan: PT plan of care cert/re-cert  Foot drop, left - Plan: PT plan of care cert/re-cert  Hemiplegia and hemiparesis following cerebral infarction  affecting left non-dominant side ([place]) - Plan: PT plan of care cert/re-cert     Problem List Patient Active Problem List   Diagnosis Date Noted  . Carotid  artery disease ([place]) 07/06/2016  . Gait abnormality 05/13/2016  . Cerebrovascular accident (CVA) due to thrombosis of right anterior cerebral artery ([place]) 05/13/2016  . Complete heart block ([place]) 05/07/2016  . Benign essential HTN 08/21/2015  . Acute CVA (cerebrovascular accident) ([place]) 08/04/2015  . Left hemiparesis ([place]) 08/04/2015  . Primary osteoarthritis of left knee 08/04/2015  . Esophageal reflux 08/04/2015  . Hyperlipidemia LDL goal <70 08/04/2015  . Paroxysmal atrial fibrillation ([place]) 08/04/2015  . PVD (peripheral vascular disease) ([place]) 08/04/2015  . Protein-calorie malnutrition ([place]) 08/04/2015    Rexanne Mano, PT 09/16/2016, 8:22 PM  [facility] [address] [place], [place], [address] Phone: [phone]   Fax:  [phone]  Name: Virginia Meyer MRN: [id number] Date of Birth: 01-May-1928

## 2016-09-21 ENCOUNTER — Encounter: Payer: Self-pay | Admitting: Physical Therapy

## 2016-09-21 ENCOUNTER — Ambulatory Visit: Payer: Medicare HMO | Admitting: Physical Therapy

## 2016-09-21 DIAGNOSIS — R29898 Other symptoms and signs involving the musculoskeletal system: Secondary | ICD-10-CM

## 2016-09-21 DIAGNOSIS — M6281 Muscle weakness (generalized): Secondary | ICD-10-CM

## 2016-09-21 DIAGNOSIS — R2681 Unsteadiness on feet: Secondary | ICD-10-CM

## 2016-09-21 DIAGNOSIS — Z8673 Personal history of transient ischemic attack (TIA), and cerebral infarction without residual deficits: Secondary | ICD-10-CM | POA: Diagnosis not present

## 2016-09-21 DIAGNOSIS — I69354 Hemiplegia and hemiparesis following cerebral infarction affecting left non-dominant side: Secondary | ICD-10-CM | POA: Diagnosis not present

## 2016-09-21 DIAGNOSIS — I4891 Unspecified atrial fibrillation: Secondary | ICD-10-CM | POA: Diagnosis not present

## 2016-09-21 DIAGNOSIS — I10 Essential (primary) hypertension: Secondary | ICD-10-CM | POA: Diagnosis not present

## 2016-09-21 DIAGNOSIS — N183 Chronic kidney disease, stage 3 (moderate): Secondary | ICD-10-CM | POA: Diagnosis not present

## 2016-09-21 DIAGNOSIS — R69 Illness, unspecified: Secondary | ICD-10-CM | POA: Diagnosis not present

## 2016-09-21 DIAGNOSIS — G301 Alzheimer's disease with late onset: Secondary | ICD-10-CM | POA: Diagnosis not present

## 2016-09-21 DIAGNOSIS — H9190 Unspecified hearing loss, unspecified ear: Secondary | ICD-10-CM | POA: Diagnosis not present

## 2016-09-21 NOTE — Patient Instructions (Signed)
    1. Bring your wheelchair so that it is facing the hallway rail or sink if able to get to it. 2. Follow your steps for standing: scoot out, position feet and use arms to push up from chair. 3. As you are standing reach forward for your support.  4. Stand up tall for 1-2 minutes.  Perform this daily as able with help of caregiver.

## 2016-09-21 NOTE — Therapy (Signed)
[facility] [address] [place], [place], [address] Phone: [phone]   Fax:  [phone]  Physical Therapy Treatment  Patient Details  Name: Virginia Meyer MRN: [id number] Date of Birth: 07-30-1928 Referring Provider: Sarina Ill, MD  Encounter Date: 09/21/2016      PT End of Session - 09/21/16 1022    Visit Number 39  g-code due vist number 20   Number of Visits 33  recert 9/37   Date for PT Re-Evaluation 11/15/16   Authorization Type MCR; G-Code   Authorization Time Period 07/20/16 to 09/17/16; 09/16/16 to 11/15/16   PT Start Time 1020   PT Stop Time 1100   PT Time Calculation (min) 40 min   Equipment Utilized During Treatment Gait belt   Activity Tolerance Patient tolerated treatment well   Behavior During Therapy [facility] for tasks assessed/performed      Past Medical History:  Diagnosis Date  . Anxiety   . Fall   . GERD (gastroesophageal reflux disease)   . Heart failure ([place])   . HLD (hyperlipidemia)   . HTN (hypertension)   . Hyperlipidemia   . Hypotension   . Major depressive disorder   . Neuropathy   . PVD (peripheral vascular disease) ([place])   . Stroke [facility])    left sided deficit  . UTI (urinary tract infection)     Past Surgical History:  Procedure Laterality Date  . ABDOMINAL HYSTERECTOMY    . FOOT NEUROMA SURGERY    . SPINE SURGERY    . TONSILLECTOMY      There were no vitals filed for this visit.      Subjective Assessment - 09/21/16 1020    Subjective No new complaints. No falls or pain to report. Has not had bed adjusted as yet, plans to call her son today.    Patient is accompained by: Family member   Pertinent History hospitalized with CHB 04/2016, afib, HTN, peripheral neuropathy, CHF, PVD, 05/27/15 large R MCA CVA, lt foot drop due to back sx 2015   Limitations Standing;Walking   How long can you stand comfortably? unable   How long can you walk comfortably? unable;  self-propels her w/c with bil LEs and RUE   Patient Stated Goals she wants to learn to stand and walk again.   Currently in Pain? No/denies   Pain Score 0-No pain              [facility] Adult PT Treatment/Exercise - 09/21/16 1054      Transfers   Transfers Sit to Stand;Stand to [employer] Transfers   Sit to Stand 4: Min assist;4: Min guard;With upper extremity assist;From bed;From chair/3-in-1   Sit to Stand Details Verbal cues for sequencing;Verbal cues for technique;Manual facilitation for weight shifting;Manual facilitation for placement   Sit to Stand Details (indicate cue type and reason) performed multiple times throughout session with min assist from wheelchair, mat table and nustep. each time needing cues for upright, tall posture with standing.    Stand to Sit 4: Min assist;With upper extremity assist;To bed;To chair/3-in-1   Stand to Sit Details (indicate cue type and reason) Verbal cues for sequencing;Verbal cues for technique;Manual facilitation for weight shifting;Manual facilitation for placement   Stand to Sit Details cues with each sit to reach back and for slow, controlled descent.   Stand Pivot Transfers 4: Min assist;3: Mod assist   Stand Pivot Transfer Details (indicate cue type and reason) wheelchair to/from mat with min assist with cues  on technique and sequecing. wheelchair to/from nustep with min assist  and cues as stated.      Exercises   Other Exercises  seated at edge of mat: passive stretching of bil knee extension with gentle overpressure x 2 minutes each side.      Knee/Hip Exercises: Aerobic   Nustep level 2 x UE/LE's for 8 minutes working on full knee extension on both sides as able.              PT Education - 09/21/16 2138    Education provided Yes   Education Details standing with UE support for working on strengthening and activity tolerance   Person(s) Educated Patient;Caregiver(s)   Methods Explanation;Demonstration;Verbal cues;Handout    Comprehension Verbalized understanding;Returned demonstration          PT Short Term Goals - 09/16/16 1930      PT SHORT TERM GOAL #1   Title Patient will increase rt knee extension to -10 degrees and Lt knee extension to -32 degrees to improve patient's ability to stand and work towards ambulation.    Time 4   Period Weeks   Status New   Target Date 10/16/16     PT SHORT TERM GOAL #2   Title Patient will transfer wheelchair to/from bed with minimal assistance on 3 of 4 attempts with LRAD (as indicated).    Time 4   Period Weeks   Status New   Target Date 10/16/16     PT SHORT TERM GOAL #3   Title Patient will stand with left foot on 2-3" step and perform reaching tasks with single UE support for 6 minutes (to increase strength/endurance with ultimate goal to progress to ambulation).    Time 4   Period Weeks   Status New   Target Date 10/16/16           PT Long Term Goals - 09/16/16 1945      PT LONG TERM GOAL #1   Title Patient and caregiver/family will demonstrate independence with updated HEP     Time 8   Period Weeks   Status Unable to assess  due to lack of time; will be ongoing goal     PT LONG TERM GOAL #2   Title Patient will transfer from wheelchair to/from bed with minimal assistance and LRAD on 4 of 5 transfers.   Time 8   Period Weeks   Status Not Met     PT LONG TERM GOAL #3   Title Patient will perform sit to stand with LRAD with minimal assistance and maintain standing x 90 seconds with minimal assistance for weight-shifting/pre-gait activities.    Baseline 09/16/16  Min assist to // bars, standing max of 4 minutes with reaching tasks for balance   Time 8   Period Weeks   Status Achieved     PT LONG TERM GOAL #4   Title Patient will demonstrate improved dynamic sitting balance by reaching to her foot/shoe while sitting on edge of mat table independently.   Time 8   Period Weeks   Status Unable to assess  due to lack of time; shortened  session going to restroom     PT LONG TERM GOAL #5   Title Patient will have a method to educate her caregivers at the ALF on the best method to help her transfer (ex. laminated sheet).    Baseline Patient has been unable to remember sequence for transfer.    Time 8   Period Weeks  Status New   Target Date 11/15/16     Additional Long Term Goals   Additional Long Term Goals Yes     PT LONG TERM GOAL #6   Title Patient will stand with LRAD with min assist.    Time 8   Period Weeks   Status New   Target Date 11/15/16     PT LONG TERM GOAL #7   Title Patient will perform pre-gait/balance activities x 5 minutes (no step under left foot) with LRAD.    Time 8   Period Weeks   Status New   Target Date 11/15/16     PT LONG TERM GOAL #8   Title Patient will be independent with her updated HEP for stretching and strengthening using handout.    Time 8   Period Weeks   Status New   Target Date 11/15/16               Plan - 09/21/16 1023    Clinical Impression Statement Today's skilled session continued to address bil knee extension and transfer training with emphasis on sit/stands. Pt is able to demo the sequencing for standing without cues. She reports that "some" of the caregivers at ALF follow the directions and some don't. Added standing with UE support to HEP with caregiver assistance today. Pt is progressing toward goals and should benefit from continued PT to progress toward unmet goals.    Rehab Potential Fair   Clinical Impairments Affecting Rehab Potential duration >12 months since onset CVA; left foot drop from back surgery prior to CVA; bil LE contractures; need for assistance to carryover HEP and other mobility education   PT Frequency 2x / week   PT Duration 8 weeks   PT Treatment/Interventions ADLs/Self Care Home Management;DME Instruction;Gait training;Functional mobility training;Therapeutic activities;Therapeutic exercise;Balance training;Neuromuscular  re-education;Cognitive remediation;Patient/family education;Orthotic Fit/Training;Wheelchair mobility training;Manual techniques;Moist Heat;Ultrasound;Passive range of motion;Splinting;Taping   PT Next Visit Plan  Continue to work on independence with sequencing in performance of squat pivot and sit to stand transfers ;, improving standing tolerance and increasing bilateral LE ROM.    Consulted and Agree with Plan of Care Patient;Family member/caregiver   Family Member Consulted Caregiver      Patient will benefit from skilled therapeutic intervention in order to improve the following deficits and impairments:  Decreased balance, Decreased coordination, Decreased knowledge of use of DME, Decreased mobility, Decreased range of motion, Decreased strength, Difficulty walking, Impaired flexibility, Impaired sensation, Impaired UE functional use, Postural dysfunction, Decreased cognition  Visit Diagnosis: Muscle weakness (generalized)  Unsteadiness on feet  Other symptoms and signs involving the musculoskeletal system     Problem List Patient Active Problem List   Diagnosis Date Noted  . Carotid artery disease ([place]) 07/06/2016  . Gait abnormality 05/13/2016  . Cerebrovascular accident (CVA) due to thrombosis of right anterior cerebral artery ([place]) 05/13/2016  . Complete heart block ([place]) 05/07/2016  . Benign essential HTN 08/21/2015  . Acute CVA (cerebrovascular accident) ([place]) 08/04/2015  . Left hemiparesis ([place]) 08/04/2015  . Primary osteoarthritis of left knee 08/04/2015  . Esophageal reflux 08/04/2015  . Hyperlipidemia LDL goal <70 08/04/2015  . Paroxysmal atrial fibrillation ([place]) 08/04/2015  . PVD (peripheral vascular disease) ([place]) 08/04/2015  . Protein-calorie malnutrition ([place]) 08/04/2015    Willow Ora, PTA, [facility] [address], [place], [place] [address] [phone] 09/21/16, 10:08 PM   Name: Marsia Cino MRN: [id number] Date of  Birth: 08/30/1928

## 2016-09-23 ENCOUNTER — Encounter: Payer: Self-pay | Admitting: Physical Therapy

## 2016-09-23 ENCOUNTER — Ambulatory Visit: Payer: Medicare HMO | Attending: Neurology | Admitting: Physical Therapy

## 2016-09-23 DIAGNOSIS — R2681 Unsteadiness on feet: Secondary | ICD-10-CM | POA: Insufficient documentation

## 2016-09-23 DIAGNOSIS — R29898 Other symptoms and signs involving the musculoskeletal system: Secondary | ICD-10-CM | POA: Insufficient documentation

## 2016-09-23 DIAGNOSIS — I69354 Hemiplegia and hemiparesis following cerebral infarction affecting left non-dominant side: Secondary | ICD-10-CM | POA: Insufficient documentation

## 2016-09-23 DIAGNOSIS — M6281 Muscle weakness (generalized): Secondary | ICD-10-CM | POA: Insufficient documentation

## 2016-09-24 NOTE — Therapy (Signed)
[facility] [address] [place], [place], [address] Phone: [phone]   Fax:  [phone]  Physical Therapy Treatment  Patient Details  Name: Virginia Meyer MRN: [id number] Date of Birth: 16-Dec-1928 Referring Provider: Sarina Ill, MD  Encounter Date: 09/23/2016      PT End of Session - 09/23/16 1021    Visit Number 14  g-code due vist number 20   Number of Visits 33  recert 7/02   Date for PT Re-Evaluation 11/15/16   Authorization Type MCR; G-Code   Authorization Time Period 07/20/16 to 09/17/16; 09/16/16 to 11/15/16   PT Start Time 1018   PT Stop Time 1100   PT Time Calculation (min) 42 min   Equipment Utilized During Treatment Gait belt   Activity Tolerance Patient tolerated treatment well   Behavior During Therapy [facility] for tasks assessed/performed      Past Medical History:  Diagnosis Date  . Anxiety   . Fall   . GERD (gastroesophageal reflux disease)   . Heart failure ([place])   . HLD (hyperlipidemia)   . HTN (hypertension)   . Hyperlipidemia   . Hypotension   . Major depressive disorder   . Neuropathy   . PVD (peripheral vascular disease) ([place])   . Stroke [facility])    left sided deficit  . UTI (urinary tract infection)     Past Surgical History:  Procedure Laterality Date  . ABDOMINAL HYSTERECTOMY    . FOOT NEUROMA SURGERY    . SPINE SURGERY    . TONSILLECTOMY      There were no vitals filed for this visit.      Subjective Assessment - 09/23/16 1020    Subjective No new complaints. No falls or pain to report. Has not stood at home with caregiver support/assistance as given with HEP last session.    Patient is accompained by: Family member   Pertinent History hospitalized with CHB 04/2016, afib, HTN, peripheral neuropathy, CHF, PVD, 05/27/15 large R MCA CVA, lt foot drop due to back sx 2015   Limitations Standing;Walking   How long can you stand comfortably? unable   How long can you walk  comfortably? unable; self-propels her w/c with bil LEs and RUE   Patient Stated Goals she wants to learn to stand and walk again.   Currently in Pain? No/denies   Pain Score 0-No pain           [facility] Adult PT Treatment/Exercise - 09/23/16 1025      Transfers   Transfers Sit to Stand;Stand to [employer] Transfers   Sit to Stand 4: Min assist;3: Mod assist;2: Max assist   Sit to Stand Details Verbal cues for sequencing;Verbal cues for technique;Manual facilitation for weight shifting;Manual facilitation for placement   Sit to Stand Details (indicate cue type and reason) increased assistance needed as reps progressed due to knee pain   Stand to Sit 3: Mod assist;4: Min assist   Stand to Sit Details (indicate cue type and reason) Verbal cues for sequencing;Verbal cues for technique;Manual facilitation for weight shifting;Manual facilitation for placement   Stand to Sit Details increased assistance needed as session progressed due to pain in knees   Stand Pivot Transfers 4: Min assist;3: Mod assist;2: Max assist   Stand Pivot Transfer Details (indicate cue type and reason) min assist wheelchair to mat, mod/max assist for transfer mat to wheelchair   Comments worked on standing tolerance x 3 stands using post op shoe over her left shoe  to assist with equal weight bearing with UE support: worked on lateral weight shifting each time. attempted to work on right stepping fwd/bwd and laterally with pt unable to shift or move right leg each time despite max assist/cues.                                         Exercises   Other Exercises  seated at edge of mat: passive stretching of bil knee extension with gentle overpressure x 2 minutes each side.      Knee/Hip Exercises: Seated   Long Arc Quad AAROM;Strengthening;Both;5 reps;Limitations   Long Arc Quad Limitations within pt's limited range of motion with cues to maintain upright posture   Knee/Hip Flexion seated marches x 10 reps each leg AROM      Knee/Hip Exercises: Supine   Short Arc Quad Sets AROM;Strengthening;Both;1 set;10 reps;Limitations   Short Arc Quad Sets Limitations cues on form and technique within pt's limited ranges     Manual Therapy   Other Manual Therapy contract relax techniqe to left knee for knee extension             PT Short Term Goals - 09/16/16 1930      PT SHORT TERM GOAL #1   Title Patient will increase rt knee extension to -10 degrees and Lt knee extension to -32 degrees to improve patient's ability to stand and work towards ambulation.    Time 4   Period Weeks   Status New   Target Date 10/16/16     PT SHORT TERM GOAL #2   Title Patient will transfer wheelchair to/from bed with minimal assistance on 3 of 4 attempts with LRAD (as indicated).    Time 4   Period Weeks   Status New   Target Date 10/16/16     PT SHORT TERM GOAL #3   Title Patient will stand with left foot on 2-3" step and perform reaching tasks with single UE support for 6 minutes (to increase strength/endurance with ultimate goal to progress to ambulation).    Time 4   Period Weeks   Status New   Target Date 10/16/16           PT Long Term Goals - 09/16/16 1945      PT LONG TERM GOAL #1   Title Patient and caregiver/family will demonstrate independence with updated HEP     Time 8   Period Weeks   Status Unable to assess  due to lack of time; will be ongoing goal     PT LONG TERM GOAL #2   Title Patient will transfer from wheelchair to/from bed with minimal assistance and LRAD on 4 of 5 transfers.   Time 8   Period Weeks   Status Not Met     PT LONG TERM GOAL #3   Title Patient will perform sit to stand with LRAD with minimal assistance and maintain standing x 90 seconds with minimal assistance for weight-shifting/pre-gait activities.    Baseline 09/16/16  Min assist to // bars, standing max of 4 minutes with reaching tasks for balance   Time 8   Period Weeks   Status Achieved     PT LONG TERM GOAL #4    Title Patient will demonstrate improved dynamic sitting balance by reaching to her foot/shoe while sitting on edge of mat table independently.   Time 8  Period Weeks   Status Unable to assess  due to lack of time; shortened session going to restroom     PT LONG TERM GOAL #5   Title Patient will have a method to educate her caregivers at the ALF on the best method to help her transfer (ex. laminated sheet).    Baseline Patient has been unable to remember sequence for transfer.    Time 8   Period Weeks   Status New   Target Date 11/15/16     Additional Long Term Goals   Additional Long Term Goals Yes     PT LONG TERM GOAL #6   Title Patient will stand with LRAD with min assist.    Time 8   Period Weeks   Status New   Target Date 11/15/16     PT LONG TERM GOAL #7   Title Patient will perform pre-gait/balance activities x 5 minutes (no step under left foot) with LRAD.    Time 8   Period Weeks   Status New   Target Date 11/15/16     PT LONG TERM GOAL #8   Title Patient will be independent with her updated HEP for stretching and strengthening using handout.    Time 8   Period Weeks   Status New   Target Date 11/15/16            Plan - 09/23/16 1021    Clinical Impression Statement Today's skilled session continued to address knee extension and transfers. Pt limited with all mobility today due to bil knee pain, needing increased assistance with all transfers. Worked on gentle knee/LE strengthening as pt was able to tolerate during remainder of session.                            Rehab Potential Fair   Clinical Impairments Affecting Rehab Potential duration >12 months since onset CVA; left foot drop from back surgery prior to CVA; bil LE contractures; need for assistance to carryover HEP and other mobility education   PT Frequency 2x / week   PT Duration 8 weeks   PT Treatment/Interventions ADLs/Self Care Home Management;DME Instruction;Gait training;Functional mobility  training;Therapeutic activities;Therapeutic exercise;Balance training;Neuromuscular re-education;Cognitive remediation;Patient/family education;Orthotic Fit/Training;Wheelchair mobility training;Manual techniques;Moist Heat;Ultrasound;Passive range of motion;Splinting;Taping   PT Next Visit Plan  Continue to work on independence with sequencing in performance of squat pivot and sit to stand transfers ;, improving standing tolerance and increasing bilateral LE ROM.    Consulted and Agree with Plan of Care Patient;Family member/caregiver   Family Member Consulted Caregiver      Patient will benefit from skilled therapeutic intervention in order to improve the following deficits and impairments:  Decreased balance, Decreased coordination, Decreased knowledge of use of DME, Decreased mobility, Decreased range of motion, Decreased strength, Difficulty walking, Impaired flexibility, Impaired sensation, Impaired UE functional use, Postural dysfunction, Decreased cognition  Visit Diagnosis: Muscle weakness (generalized)  Unsteadiness on feet  Other symptoms and signs involving the musculoskeletal system  Hemiplegia and hemiparesis following cerebral infarction affecting left non-dominant side [facility])     Problem List Patient Active Problem List   Diagnosis Date Noted  . Carotid artery disease ([place]) 07/06/2016  . Gait abnormality 05/13/2016  . Cerebrovascular accident (CVA) due to thrombosis of right anterior cerebral artery ([place]) 05/13/2016  . Complete heart block ([place]) 05/07/2016  . Benign essential HTN 08/21/2015  . Acute CVA (cerebrovascular accident) ([place]) 08/04/2015  . Left hemiparesis ([place]) 08/04/2015  .  Primary osteoarthritis of left knee 08/04/2015  . Esophageal reflux 08/04/2015  . Hyperlipidemia LDL goal <70 08/04/2015  . Paroxysmal atrial fibrillation ([place]) 08/04/2015  . PVD (peripheral vascular disease) ([place]) 08/04/2015  . Protein-calorie malnutrition ([place]) 08/04/2015     Willow Ora, PTA, [facility] [address], [place], [place] [address] [phone] 09/24/16, 4:41 PM   Name: Virginia Meyer MRN: [id number] Date of Birth: 10-26-1928

## 2016-09-28 ENCOUNTER — Encounter: Payer: Self-pay | Admitting: Physical Therapy

## 2016-09-28 ENCOUNTER — Ambulatory Visit: Payer: Medicare HMO | Admitting: Physical Therapy

## 2016-09-28 DIAGNOSIS — R2681 Unsteadiness on feet: Secondary | ICD-10-CM | POA: Diagnosis not present

## 2016-09-28 DIAGNOSIS — I69354 Hemiplegia and hemiparesis following cerebral infarction affecting left non-dominant side: Secondary | ICD-10-CM | POA: Diagnosis not present

## 2016-09-28 DIAGNOSIS — M6281 Muscle weakness (generalized): Secondary | ICD-10-CM

## 2016-09-28 DIAGNOSIS — R29898 Other symptoms and signs involving the musculoskeletal system: Secondary | ICD-10-CM | POA: Diagnosis not present

## 2016-09-28 NOTE — Therapy (Signed)
[facility] [address] [place], [place], [address] Phone: [phone]   Fax:  [phone]  Physical Therapy Treatment  Patient Details  Name: Virginia Meyer MRN: [id number] Date of Birth: Aug 07, 1928 Referring Provider: Sarina Ill, MD  Encounter Date: 09/28/2016      PT End of Session - 09/28/16 1020    Visit Number 15  g-code due vist number 20   Number of Visits 33  recert 2/86   Date for PT Re-Evaluation 11/15/16   Authorization Type MCR; G-Code   Authorization Time Period 07/20/16 to 09/17/16; 09/16/16 to 11/15/16   PT Start Time 1018   PT Stop Time 1101  -7 that pt was in bathroom   PT Time Calculation (min) 43 min   Equipment Utilized During Treatment Gait belt   Activity Tolerance Patient tolerated treatment well   Behavior During Therapy [facility] for tasks assessed/performed      Past Medical History:  Diagnosis Date  . Anxiety   . Fall   . GERD (gastroesophageal reflux disease)   . Heart failure ([place])   . HLD (hyperlipidemia)   . HTN (hypertension)   . Hyperlipidemia   . Hypotension   . Major depressive disorder   . Neuropathy   . PVD (peripheral vascular disease) ([place])   . Stroke [facility])    left sided deficit  . UTI (urinary tract infection)     Past Surgical History:  Procedure Laterality Date  . ABDOMINAL HYSTERECTOMY    . FOOT NEUROMA SURGERY    . SPINE SURGERY    . TONSILLECTOMY      There were no vitals filed for this visit.      Subjective Assessment - 09/28/16 1019    Subjective No new complaints. No falls or pain to reprot. Has been standing at rail at home with caregiver.   Pertinent History hospitalized with CHB 04/2016, afib, HTN, peripheral neuropathy, CHF, PVD, 05/27/15 large R MCA CVA, lt foot drop due to back sx 2015   Limitations Standing;Walking   How long can you stand comfortably? unable   How long can you walk comfortably? unable; self-propels her w/c with bil LEs and  RUE   Patient Stated Goals she wants to learn to stand and walk again.   Currently in Pain? No/denies   Pain Score 0-No pain             [facility] Adult PT Treatment/Exercise - 09/28/16 1021      Transfers   Transfers Sit to Stand;Stand to Sit   Sit to Stand 4: Min assist   Sit to Stand Details Verbal cues for sequencing;Verbal cues for technique;Manual facilitation for weight shifting;Manual facilitation for placement   Stand to Sit 3: Mod assist;2: Max assist   Stand to Sit Details (indicate cue type and reason) Verbal cues for sequencing;Verbal cues for technique;Manual facilitation for weight shifting;Manual facilitation for placement   Stand Pivot Transfers 3: Mod assist   Stand Pivot Transfer Details (indicate cue type and reason) mod assist for transfer wheelchair<>mat table with cues on sequencing and technique x 2 reps   Comments worked on standing balance x 2 rep with left LE on 2 inch box: worked on hip and trunk extension for tall posture, lateral weight shifting and alternating UE reaches. Pt able to stand 1-2 minutes each time before needing to sit back down.     Knee/Hip Exercises: Seated   Long Arc Quad AROM;Strengthening;Both;1 set;10 reps;Limitations   Long [employer] Limitations  within pt's limited range of motion with cues to maintain upright posture   Knee/Hip Flexion seated marches x 10 reps each leg  with yellow band resistance   Other Seated Knee/Hip Exercises hip abduction/ER (clamshell) with yellow band resisitance 5 sec holds x 10 reps.    Hamstring Curl Strengthening;Both;1 set;10 reps;Limitations   Hamstring Limitations with yellow theraband resistance: cues on ex form and technique with manual overpressure to keep thigh down on mat during each rep.             PT Short Term Goals - 09/16/16 1930      PT SHORT TERM GOAL #1   Title Patient will increase rt knee extension to -10 degrees and Lt knee extension to -32 degrees to improve patient's ability to  stand and work towards ambulation.    Time 4   Period Weeks   Status New   Target Date 10/16/16     PT SHORT TERM GOAL #2   Title Patient will transfer wheelchair to/from bed with minimal assistance on 3 of 4 attempts with LRAD (as indicated).    Time 4   Period Weeks   Status New   Target Date 10/16/16     PT SHORT TERM GOAL #3   Title Patient will stand with left foot on 2-3" step and perform reaching tasks with single UE support for 6 minutes (to increase strength/endurance with ultimate goal to progress to ambulation).    Time 4   Period Weeks   Status New   Target Date 10/16/16           PT Long Term Goals - 09/16/16 1945      PT LONG TERM GOAL #1   Title Patient and caregiver/family will demonstrate independence with updated HEP     Time 8   Period Weeks   Status Unable to assess  due to lack of time; will be ongoing goal     PT LONG TERM GOAL #2   Title Patient will transfer from wheelchair to/from bed with minimal assistance and LRAD on 4 of 5 transfers.   Time 8   Period Weeks   Status Not Met     PT LONG TERM GOAL #3   Title Patient will perform sit to stand with LRAD with minimal assistance and maintain standing x 90 seconds with minimal assistance for weight-shifting/pre-gait activities.    Baseline 09/16/16  Min assist to // bars, standing max of 4 minutes with reaching tasks for balance   Time 8   Period Weeks   Status Achieved     PT LONG TERM GOAL #4   Title Patient will demonstrate improved dynamic sitting balance by reaching to her foot/shoe while sitting on edge of mat table independently.   Time 8   Period Weeks   Status Unable to assess  due to lack of time; shortened session going to restroom     PT LONG TERM GOAL #5   Title Patient will have a method to educate her caregivers at the ALF on the best method to help her transfer (ex. laminated sheet).    Baseline Patient has been unable to remember sequence for transfer.    Time 8   Period  Weeks   Status New   Target Date 11/15/16     Additional Long Term Goals   Additional Long Term Goals Yes     PT LONG TERM GOAL #6   Title Patient will stand with LRAD with min assist.  Time 8   Period Weeks   Status New   Target Date 11/15/16     PT LONG TERM GOAL #7   Title Patient will perform pre-gait/balance activities x 5 minutes (no step under left foot) with LRAD.    Time 8   Period Weeks   Status New   Target Date 11/15/16     PT LONG TERM GOAL #8   Title Patient will be independent with her updated HEP for stretching and strengthening using handout.    Time 8   Period Weeks   Status New   Target Date 11/15/16               Plan - 09/28/16 1020    Clinical Impression Statement Today's skilled session continued to address transfers, standing balance/tolerance and LE strengthening. Pt contineud to be limited in standing today due to bil knee pain.    Rehab Potential Fair   Clinical Impairments Affecting Rehab Potential duration >12 months since onset CVA; left foot drop from back surgery prior to CVA; bil LE contractures; need for assistance to carryover HEP and other mobility education   PT Frequency 2x / week   PT Duration 8 weeks   PT Treatment/Interventions ADLs/Self Care Home Management;DME Instruction;Gait training;Functional mobility training;Therapeutic activities;Therapeutic exercise;Balance training;Neuromuscular re-education;Cognitive remediation;Patient/family education;Orthotic Fit/Training;Wheelchair mobility training;Manual techniques;Moist Heat;Ultrasound;Passive range of motion;Splinting;Taping   PT Next Visit Plan  Continue to work on independence with sequencing in performance of squat pivot and sit to stand transfers ;, improving standing tolerance and increasing bilateral LE ROM.    Consulted and Agree with Plan of Care Patient;Family member/caregiver   Family Member Consulted Caregiver      Patient will benefit from skilled therapeutic  intervention in order to improve the following deficits and impairments:  Decreased balance, Decreased coordination, Decreased knowledge of use of DME, Decreased mobility, Decreased range of motion, Decreased strength, Difficulty walking, Impaired flexibility, Impaired sensation, Impaired UE functional use, Postural dysfunction, Decreased cognition  Visit Diagnosis: Muscle weakness (generalized)  Unsteadiness on feet  Other symptoms and signs involving the musculoskeletal system     Problem List Patient Active Problem List   Diagnosis Date Noted  . Carotid artery disease ([place]) 07/06/2016  . Gait abnormality 05/13/2016  . Cerebrovascular accident (CVA) due to thrombosis of right anterior cerebral artery ([place]) 05/13/2016  . Complete heart block ([place]) 05/07/2016  . Benign essential HTN 08/21/2015  . Acute CVA (cerebrovascular accident) ([place]) 08/04/2015  . Left hemiparesis ([place]) 08/04/2015  . Primary osteoarthritis of left knee 08/04/2015  . Esophageal reflux 08/04/2015  . Hyperlipidemia LDL goal <70 08/04/2015  . Paroxysmal atrial fibrillation ([place]) 08/04/2015  . PVD (peripheral vascular disease) ([place]) 08/04/2015  . Protein-calorie malnutrition ([place]) 08/04/2015    Willow Ora, PTA, [facility] [address], [place], [place] [address] [phone] 09/28/16, 8:57 PM   Name: Virginia Meyer MRN: [id number] Date of Birth: 1928-08-25

## 2016-09-30 ENCOUNTER — Encounter: Payer: Self-pay | Admitting: Physical Therapy

## 2016-09-30 ENCOUNTER — Ambulatory Visit: Payer: Medicare HMO | Admitting: Physical Therapy

## 2016-09-30 DIAGNOSIS — R2681 Unsteadiness on feet: Secondary | ICD-10-CM | POA: Diagnosis not present

## 2016-09-30 DIAGNOSIS — M6281 Muscle weakness (generalized): Secondary | ICD-10-CM

## 2016-09-30 DIAGNOSIS — R29898 Other symptoms and signs involving the musculoskeletal system: Secondary | ICD-10-CM | POA: Diagnosis not present

## 2016-09-30 DIAGNOSIS — I69354 Hemiplegia and hemiparesis following cerebral infarction affecting left non-dominant side: Secondary | ICD-10-CM | POA: Diagnosis not present

## 2016-09-30 NOTE — Therapy (Signed)
[facility] [address] [place], [place], [address] Phone: [phone]   Fax:  [phone]  Physical Therapy Treatment  Patient Details  Name: Virginia Meyer MRN: [id number] Date of Birth: 1928/09/06 Referring Provider: Sarina Ill, MD  Encounter Date: 09/30/2016      PT End of Session - 09/30/16 1710    Visit Number 61  g-code due vist number 20   Number of Visits 33  recert 3/53   Date for PT Re-Evaluation 11/15/16   Authorization Type MCR; G-Code   Authorization Time Period 07/20/16 to 09/17/16; 09/16/16 to 11/15/16   PT Start Time 1015   PT Stop Time 1058   PT Time Calculation (min) 43 min   Equipment Utilized During Treatment Gait belt   Activity Tolerance Patient tolerated treatment well   Behavior During Therapy [facility] for tasks assessed/performed      Past Medical History:  Diagnosis Date  . Anxiety   . Fall   . GERD (gastroesophageal reflux disease)   . Heart failure ([place])   . HLD (hyperlipidemia)   . HTN (hypertension)   . Hyperlipidemia   . Hypotension   . Major depressive disorder   . Neuropathy   . PVD (peripheral vascular disease) ([place])   . Stroke [facility])    left sided deficit  . UTI (urinary tract infection)     Past Surgical History:  Procedure Laterality Date  . ABDOMINAL HYSTERECTOMY    . FOOT NEUROMA SURGERY    . SPINE SURGERY    . TONSILLECTOMY      There were no vitals filed for this visit.      Subjective Assessment - 09/30/16 1012    Subjective No changes.    Pertinent History hospitalized with CHB 04/2016, afib, HTN, peripheral neuropathy, CHF, PVD, 05/27/15 large R MCA CVA, lt foot drop due to back sx 2015   Limitations Standing;Walking   How long can you stand comfortably? unable   How long can you walk comfortably? unable; self-propels her w/c with bil LEs and RUE   Patient Stated Goals she wants to learn to stand and walk again.   Currently in Pain? No/denies                          [facility] Adult PT Treatment/Exercise - 09/30/16 1659      Transfers   Transfers Sit to Stand;Stand to Sit;Stand Pivot Transfers;Squat Pivot Transfers   Sit to Stand 3: Mod assist;With upper extremity assist  from mat; with and without Lt foot on 3" stool; 3 reps standing for up to 2 minutes   Sit to Stand Details (indicate cue type and reason) max cues and physical assist for forward weight shift   Stand to Sit 3: Mod assist   Stand Pivot Transfers 3: Mod assist x 2 reps   Stand Pivot Transfer Details (indicate cue type and reason) pivoted with less assist in standing than in squat-pivot   Squat Pivot Transfers 2: Max assist x 2 reps   Comments Patient reports the caregivers at ALF continue to "pick me up" and pivot her.      Static Sitting Balance   Static Sitting - Balance Support No upper extremity supported;Feet supported   Static Sitting - Level of Assistance 5: Stand by assistance     Static Standing Balance   Static Standing - Balance Support Bilateral upper extremity supported   Static Standing - Level of Assistance 3: Mod  assist;2: Max assist   Static Standing - Comment/# of Minutes posterior and left lean     Therapeutic Activites    Other Therapeutic Activities Sitting at front edge of seat, bil hands on blue physioball rolling ball forwards and back to promote anterior weight shift and hip flexion. Afterward worked on scooting back and then forwards in sitting with using flexion (pt typically uses extension to come forward); required max assist to lift pelvis enough to scoot                  PT Short Term Goals - 09/16/16 1930      PT SHORT TERM GOAL #1   Title Patient will increase rt knee extension to -10 degrees and Lt knee extension to -32 degrees to improve patient's ability to stand and work towards ambulation.    Time 4   Period Weeks   Status New   Target Date 10/16/16     PT SHORT TERM GOAL #2   Title Patient  will transfer wheelchair to/from bed with minimal assistance on 3 of 4 attempts with LRAD (as indicated).    Time 4   Period Weeks   Status New   Target Date 10/16/16     PT SHORT TERM GOAL #3   Title Patient will stand with left foot on 2-3" step and perform reaching tasks with single UE support for 6 minutes (to increase strength/endurance with ultimate goal to progress to ambulation).    Time 4   Period Weeks   Status New   Target Date 10/16/16           PT Long Term Goals - 09/16/16 1945      PT LONG TERM GOAL #1   Title Patient and caregiver/family will demonstrate independence with updated HEP     Time 8   Period Weeks   Status Unable to assess  due to lack of time; will be ongoing goal     PT LONG TERM GOAL #2   Title Patient will transfer from wheelchair to/from bed with minimal assistance and LRAD on 4 of 5 transfers.   Time 8   Period Weeks   Status Not Met     PT LONG TERM GOAL #3   Title Patient will perform sit to stand with LRAD with minimal assistance and maintain standing x 90 seconds with minimal assistance for weight-shifting/pre-gait activities.    Baseline 09/16/16  Min assist to // bars, standing max of 4 minutes with reaching tasks for balance   Time 8   Period Weeks   Status Achieved     PT LONG TERM GOAL #4   Title Patient will demonstrate improved dynamic sitting balance by reaching to her foot/shoe while sitting on edge of mat table independently.   Time 8   Period Weeks   Status Unable to assess  due to lack of time; shortened session going to restroom     PT LONG TERM GOAL #5   Title Patient will have a method to educate her caregivers at the ALF on the best method to help her transfer (ex. laminated sheet).    Baseline Patient has been unable to remember sequence for transfer.    Time 8   Period Weeks   Status New   Target Date 11/15/16     Additional Long Term Goals   Additional Long Term Goals Yes     PT LONG TERM GOAL #6    Title Patient will stand with  LRAD with min assist.    Time 8   Period Weeks   Status New   Target Date 11/15/16     PT LONG TERM GOAL #7   Title Patient will perform pre-gait/balance activities x 5 minutes (no step under left foot) with LRAD.    Time 8   Period Weeks   Status New   Target Date 11/15/16     PT LONG TERM GOAL #8   Title Patient will be independent with her updated HEP for stretching and strengthening using handout.    Time 8   Period Weeks   Status New   Target Date 11/15/16               Plan - 09/30/16 1716    Clinical Impression Statement Session focused on transfer and balance training. Pt reports her transfer techniques at ALF have not changed despite PT sending some written suggestions on sequencing (and asking for her caregivers' input for tips/sequencing as well).    Rehab Potential Fair   Clinical Impairments Affecting Rehab Potential duration >12 months since onset CVA; left foot drop from back surgery prior to CVA; bil LE contractures; need for assistance to carryover HEP and other mobility education   PT Frequency 2x / week   PT Duration 8 weeks   PT Treatment/Interventions ADLs/Self Care Home Management;DME Instruction;Gait training;Functional mobility training;Therapeutic activities;Therapeutic exercise;Balance training;Neuromuscular re-education;Cognitive remediation;Patient/family education;Orthotic Fit/Training;Wheelchair mobility training;Manual techniques;Moist Heat;Ultrasound;Passive range of motion;Splinting;Taping   PT Next Visit Plan  Continue to work on independence with sequencing in performance of squat pivot vs stand-pivot and sit to stand transfers ; improving standing tolerance (try reaching); increasing bilateral LE ROM.    Consulted and Agree with Plan of Care Patient;Family member/caregiver   Family Member Consulted Caregiver      Patient will benefit from skilled therapeutic intervention in order to improve the following  deficits and impairments:  Decreased balance, Decreased coordination, Decreased knowledge of use of DME, Decreased mobility, Decreased range of motion, Decreased strength, Difficulty walking, Impaired flexibility, Impaired sensation, Impaired UE functional use, Postural dysfunction, Decreased cognition  Visit Diagnosis: Muscle weakness (generalized)  Unsteadiness on feet  Other symptoms and signs involving the musculoskeletal system     Problem List Patient Active Problem List   Diagnosis Date Noted  . Carotid artery disease ([place]) 07/06/2016  . Gait abnormality 05/13/2016  . Cerebrovascular accident (CVA) due to thrombosis of right anterior cerebral artery ([place]) 05/13/2016  . Complete heart block ([place]) 05/07/2016  . Benign essential HTN 08/21/2015  . Acute CVA (cerebrovascular accident) ([place]) 08/04/2015  . Left hemiparesis ([place]) 08/04/2015  . Primary osteoarthritis of left knee 08/04/2015  . Esophageal reflux 08/04/2015  . Hyperlipidemia LDL goal <70 08/04/2015  . Paroxysmal atrial fibrillation ([place]) 08/04/2015  . PVD (peripheral vascular disease) ([place]) 08/04/2015  . Protein-calorie malnutrition ([place]) 08/04/2015    Rexanne Mano, PT 09/30/2016, 5:31 PM  [facility] [address] [place], [place], [address] Phone: [phone]   Fax:  [phone]  Name: Ellington Cornia MRN: [id number] Date of Birth: [birth year]

## 2016-10-08 ENCOUNTER — Ambulatory Visit: Payer: Medicare HMO | Admitting: Physical Therapy

## 2016-10-12 ENCOUNTER — Ambulatory Visit: Payer: Medicare HMO | Admitting: Physical Therapy

## 2016-10-14 ENCOUNTER — Encounter: Payer: Self-pay | Admitting: Physical Therapy

## 2016-10-14 ENCOUNTER — Ambulatory Visit: Payer: Medicare HMO | Admitting: Physical Therapy

## 2016-10-14 DIAGNOSIS — R2681 Unsteadiness on feet: Secondary | ICD-10-CM

## 2016-10-14 DIAGNOSIS — M6281 Muscle weakness (generalized): Secondary | ICD-10-CM | POA: Diagnosis not present

## 2016-10-14 DIAGNOSIS — R29898 Other symptoms and signs involving the musculoskeletal system: Secondary | ICD-10-CM | POA: Diagnosis not present

## 2016-10-14 DIAGNOSIS — I69354 Hemiplegia and hemiparesis following cerebral infarction affecting left non-dominant side: Secondary | ICD-10-CM | POA: Diagnosis not present

## 2016-10-14 NOTE — Therapy (Signed)
[facility] [address] [place], [place], [address] Phone: [phone]   Fax:  [phone]  Physical Therapy Treatment  Patient Details  Name: Virginia Meyer MRN: [id number] Date of Birth: Apr 23, 1928 Referring Provider: Sarina Ill, MD  Encounter Date: 10/14/2016      PT End of Session - 10/14/16 1808    Visit Number 90  g-code due vist number 20   Number of Visits 33  recert 6/96   Date for PT Re-Evaluation 11/15/16   Authorization Type MCR; G-Code   Authorization Time Period 07/20/16 to 09/17/16; 09/16/16 to 11/15/16   PT Start Time 0847   PT Stop Time 0930   PT Time Calculation (min) 43 min   Equipment Utilized During Treatment Gait belt   Activity Tolerance Patient tolerated treatment well   Behavior During Therapy [facility] for tasks assessed/performed      Past Medical History:  Diagnosis Date  . Anxiety   . Fall   . GERD (gastroesophageal reflux disease)   . Heart failure ([place])   . HLD (hyperlipidemia)   . HTN (hypertension)   . Hyperlipidemia   . Hypotension   . Major depressive disorder   . Neuropathy   . PVD (peripheral vascular disease) ([place])   . Stroke [facility])    left sided deficit  . UTI (urinary tract infection)     Past Surgical History:  Procedure Laterality Date  . ABDOMINAL HYSTERECTOMY    . FOOT NEUROMA SURGERY    . SPINE SURGERY    . TONSILLECTOMY      There were no vitals filed for this visit.      Subjective Assessment - 10/14/16 0842    Subjective No changes. Transportation was problem and caused cancellations. Caregiver reports near fall in transferring toilet to w/c when pt's legs buckled.    Patient is accompained by: --  caregiver   Pertinent History hospitalized with CHB 04/2016, afib, HTN, peripheral neuropathy, CHF, PVD, 05/27/15 large R MCA CVA, lt foot drop due to back sx 2015   Limitations Standing;Walking   How long can you stand comfortably? unable   How long can  you walk comfortably? unable; self-propels her w/c with bil LEs and RUE   Patient Stated Goals she wants to learn to stand and walk again.   Currently in Pain? No/denies            [facility] PT Assessment - 10/14/16 0001      AROM   Right Knee Extension -8   Left Knee Extension -30     PROM   Overall PROM  Deficits                     [facility] Adult PT Treatment/Exercise - 10/14/16 0001      Transfers   Transfers Stand Pivot Transfers;Squat Pivot Transfers   Stand Pivot Transfers 3: Mod assist   Stand Pivot Transfer Details (indicate cue type and reason) difficulty pivoting  rt foot on carpet with her sneakers   Squat Pivot Transfers 3: Mod assist;With upper extremity assistance  to weak side   Comments caregiver reports perhaps slight improvement in transfers     Static Sitting Balance   Static Sitting - Balance Support No upper extremity supported;Feet supported   Static Sitting - Level of Assistance 6: Modified independent (Device/Increase time)     Static Standing Balance   Static Standing - Balance Support Right upper extremity supported   Static Standing - Level of  Assistance 4: Min assist;3: Mod assist   Static Standing - Comment/# of Minutes with Lt foot on 4" step, RUE support on //bar, LUE reaching task x 4 minutes; rested x 3 minutes and repeated with lt foot on 2" step                PT Education - 10/14/16 1807    Education provided Yes   Education Details to stretch posterior knee, must keep LLE in neutral; use her round bolster on outside of her calf to prevent hip ER on ft   Person(s) Educated Patient;Caregiver(s)   Methods Explanation;Demonstration;Tactile cues;Verbal cues   Comprehension Verbalized understanding;Returned demonstration;Verbal cues required;Tactile cues required;Need further instruction          PT Short Term Goals - 10/14/16 1810      PT SHORT TERM GOAL #1   Title Patient will increase rt knee extension to -10  degrees and Lt knee extension to -32 degrees to improve patient's ability to stand and work towards ambulation.    Baseline 8/23 Rt knee -8 knee extension, LT knee -30   Time 4   Period Weeks   Status Achieved     PT SHORT TERM GOAL #2   Title Patient will transfer wheelchair to/from bed with minimal assistance on 3 of 4 attempts with LRAD (as indicated).    Baseline 8/23 mod-max assist    Time 4   Period Weeks   Status Not Met     PT SHORT TERM GOAL #3   Title Patient will stand with left foot on 2-3" step and perform reaching tasks with single UE support for 6 minutes (to increase strength/endurance with ultimate goal to progress to ambulation).    Baseline 8/22 7 minutes standing wiht brief sitting when checkers fell to floor   Time 4   Period Weeks   Status Achieved           PT Long Term Goals - 09/30/16 1733      PT LONG TERM GOAL #1   Title Patient and caregiver/family will demonstrate independence with updated HEP     Time 8   Period Weeks   Status Unable to assess  due to lack of time; will be ongoing goal   Target Date 11/15/16     PT LONG TERM GOAL #4   Status --  due to lack of time; shortened session going to restroom     PT LONG TERM GOAL #5   Title Patient will have a method to educate her caregivers at the ALF on the best method to help her transfer (ex. laminated sheet).    Baseline Patient has been unable to remember sequence for transfer.    Time 8   Period Weeks   Status New     PT LONG TERM GOAL #6   Title Patient will stand with LRAD with min assist.    Time 8   Period Weeks   Status New     PT LONG TERM GOAL #7   Title Patient will perform pre-gait/balance activities x 5 minutes (no step under left foot) with LRAD.    Time 8   Period Weeks   Status New     PT LONG TERM GOAL #8   Title Patient will be independent with her updated HEP for stretching and strengthening using handout.    Time 8   Period Weeks   Status New  Plan - 10/14/16 1810    Clinical Impression Statement Session focused on checking STGs. Patient continues to make progress in her static standing, however not enough lt knee ROM into extension (or strength) to attempt walking. Pt met 2 of 3 STGs. Goal for minimal assist transfers remains unmet largely due to pt's poor memory for sequencing and therefor inability to direct her caregivers in how to help her.    Rehab Potential Fair   Clinical Impairments Affecting Rehab Potential duration >12 months since onset CVA; left foot drop from back surgery prior to CVA; bil LE contractures; need for assistance to carryover HEP and other mobility education   PT Frequency 2x / week   PT Duration 8 weeks   PT Treatment/Interventions ADLs/Self Care Home Management;DME Instruction;Gait training;Functional mobility training;Therapeutic activities;Therapeutic exercise;Balance training;Neuromuscular re-education;Cognitive remediation;Patient/family education;Orthotic Fit/Training;Wheelchair mobility training;Manual techniques;Moist Heat;Ultrasound;Passive range of motion;Splinting;Taping   PT Next Visit Plan Transfers, transfers, transfers. Continue to work on independence with sequencing in performance of squat pivot vs stand-pivot and sit to stand transfers ; improving standing tolerance (try reaching); increasing bilateral LE ROM.    Consulted and Agree with Plan of Care Patient;Family member/caregiver   Family Member Consulted Caregiver      Patient will benefit from skilled therapeutic intervention in order to improve the following deficits and impairments:  Decreased balance, Decreased coordination, Decreased knowledge of use of DME, Decreased mobility, Decreased range of motion, Decreased strength, Difficulty walking, Impaired flexibility, Impaired sensation, Impaired UE functional use, Postural dysfunction, Decreased cognition  Visit Diagnosis: Unsteadiness on feet  Other symptoms and  signs involving the musculoskeletal system     Problem List Patient Active Problem List   Diagnosis Date Noted  . Carotid artery disease ([place]) 07/06/2016  . Gait abnormality 05/13/2016  . Cerebrovascular accident (CVA) due to thrombosis of right anterior cerebral artery ([place]) 05/13/2016  . Complete heart block ([place]) 05/07/2016  . Benign essential HTN 08/21/2015  . Acute CVA (cerebrovascular accident) ([place]) 08/04/2015  . Left hemiparesis ([place]) 08/04/2015  . Primary osteoarthritis of left knee 08/04/2015  . Esophageal reflux 08/04/2015  . Hyperlipidemia LDL goal <70 08/04/2015  . Paroxysmal atrial fibrillation ([place]) 08/04/2015  . PVD (peripheral vascular disease) ([place]) 08/04/2015  . Protein-calorie malnutrition ([place]) 08/04/2015    Rexanne Mano, PT 10/14/2016, 6:19 PM  [facility] [address] [place], [place], [address] Phone: [phone]   Fax:  [phone]  Name: Virginia Meyer MRN: [id number] Date of Birth: 08-31-1928

## 2016-10-15 ENCOUNTER — Ambulatory Visit: Payer: Medicare HMO | Admitting: Physical Therapy

## 2016-10-18 ENCOUNTER — Emergency Department (HOSPITAL_COMMUNITY): Payer: Medicare HMO

## 2016-10-18 ENCOUNTER — Inpatient Hospital Stay (HOSPITAL_COMMUNITY): Payer: Medicare HMO

## 2016-10-18 ENCOUNTER — Other Ambulatory Visit: Payer: Self-pay

## 2016-10-18 ENCOUNTER — Encounter (HOSPITAL_COMMUNITY): Payer: Self-pay | Admitting: Radiology

## 2016-10-18 ENCOUNTER — Inpatient Hospital Stay (HOSPITAL_COMMUNITY)
Admission: EM | Admit: 2016-10-18 | Discharge: 2016-10-21 | DRG: 312 | Disposition: A | Discharge status: Skilled Nursing Facility | Payer: Medicare HMO | Attending: Family Medicine | Admitting: Family Medicine

## 2016-10-18 DIAGNOSIS — Z993 Dependence on wheelchair: Secondary | ICD-10-CM

## 2016-10-18 DIAGNOSIS — R29810 Facial weakness: Secondary | ICD-10-CM | POA: Diagnosis not present

## 2016-10-18 DIAGNOSIS — R32 Unspecified urinary incontinence: Secondary | ICD-10-CM | POA: Diagnosis not present

## 2016-10-18 DIAGNOSIS — Z8249 Family history of ischemic heart disease and other diseases of the circulatory system: Secondary | ICD-10-CM

## 2016-10-18 DIAGNOSIS — R4189 Other symptoms and signs involving cognitive functions and awareness: Secondary | ICD-10-CM | POA: Diagnosis not present

## 2016-10-18 DIAGNOSIS — I739 Peripheral vascular disease, unspecified: Secondary | ICD-10-CM | POA: Diagnosis present

## 2016-10-18 DIAGNOSIS — K219 Gastro-esophageal reflux disease without esophagitis: Secondary | ICD-10-CM | POA: Diagnosis present

## 2016-10-18 DIAGNOSIS — R54 Age-related physical debility: Secondary | ICD-10-CM | POA: Diagnosis present

## 2016-10-18 DIAGNOSIS — Z7901 Long term (current) use of anticoagulants: Secondary | ICD-10-CM | POA: Diagnosis not present

## 2016-10-18 DIAGNOSIS — G939 Disorder of brain, unspecified: Secondary | ICD-10-CM | POA: Diagnosis present

## 2016-10-18 DIAGNOSIS — I959 Hypotension, unspecified: Secondary | ICD-10-CM | POA: Diagnosis present

## 2016-10-18 DIAGNOSIS — E785 Hyperlipidemia, unspecified: Secondary | ICD-10-CM | POA: Diagnosis not present

## 2016-10-18 DIAGNOSIS — Z6822 Body mass index (BMI) 22.0-22.9, adult: Secondary | ICD-10-CM | POA: Diagnosis not present

## 2016-10-18 DIAGNOSIS — I509 Heart failure, unspecified: Secondary | ICD-10-CM | POA: Diagnosis present

## 2016-10-18 DIAGNOSIS — H9190 Unspecified hearing loss, unspecified ear: Secondary | ICD-10-CM | POA: Diagnosis present

## 2016-10-18 DIAGNOSIS — R269 Unspecified abnormalities of gait and mobility: Secondary | ICD-10-CM

## 2016-10-18 DIAGNOSIS — M47814 Spondylosis without myelopathy or radiculopathy, thoracic region: Secondary | ICD-10-CM | POA: Diagnosis not present

## 2016-10-18 DIAGNOSIS — I63549 Cerebral infarction due to unspecified occlusion or stenosis of unspecified cerebellar artery: Secondary | ICD-10-CM | POA: Diagnosis present

## 2016-10-18 DIAGNOSIS — Z79899 Other long term (current) drug therapy: Secondary | ICD-10-CM | POA: Diagnosis not present

## 2016-10-18 DIAGNOSIS — Z90710 Acquired absence of both cervix and uterus: Secondary | ICD-10-CM | POA: Diagnosis not present

## 2016-10-18 DIAGNOSIS — I700 Atherosclerosis of aorta: Secondary | ICD-10-CM | POA: Diagnosis not present

## 2016-10-18 DIAGNOSIS — I10 Essential (primary) hypertension: Secondary | ICD-10-CM | POA: Diagnosis not present

## 2016-10-18 DIAGNOSIS — I6930 Unspecified sequelae of cerebral infarction: Secondary | ICD-10-CM

## 2016-10-18 DIAGNOSIS — I6523 Occlusion and stenosis of bilateral carotid arteries: Secondary | ICD-10-CM | POA: Diagnosis not present

## 2016-10-18 DIAGNOSIS — R001 Bradycardia, unspecified: Secondary | ICD-10-CM | POA: Diagnosis present

## 2016-10-18 DIAGNOSIS — I6789 Other cerebrovascular disease: Secondary | ICD-10-CM | POA: Diagnosis not present

## 2016-10-18 DIAGNOSIS — I672 Cerebral atherosclerosis: Secondary | ICD-10-CM | POA: Diagnosis not present

## 2016-10-18 DIAGNOSIS — E46 Unspecified protein-calorie malnutrition: Secondary | ICD-10-CM | POA: Diagnosis present

## 2016-10-18 DIAGNOSIS — I352 Nonrheumatic aortic (valve) stenosis with insufficiency: Secondary | ICD-10-CM | POA: Diagnosis not present

## 2016-10-18 DIAGNOSIS — M899 Disorder of bone, unspecified: Secondary | ICD-10-CM | POA: Diagnosis present

## 2016-10-18 DIAGNOSIS — I2510 Atherosclerotic heart disease of native coronary artery without angina pectoris: Secondary | ICD-10-CM | POA: Diagnosis present

## 2016-10-18 DIAGNOSIS — I779 Disorder of arteries and arterioles, unspecified: Secondary | ICD-10-CM | POA: Diagnosis not present

## 2016-10-18 DIAGNOSIS — G8194 Hemiplegia, unspecified affecting left nondominant side: Secondary | ICD-10-CM | POA: Diagnosis present

## 2016-10-18 DIAGNOSIS — Z9889 Other specified postprocedural states: Secondary | ICD-10-CM | POA: Diagnosis not present

## 2016-10-18 DIAGNOSIS — N179 Acute kidney failure, unspecified: Secondary | ICD-10-CM | POA: Diagnosis present

## 2016-10-18 DIAGNOSIS — I69354 Hemiplegia and hemiparesis following cerebral infarction affecting left non-dominant side: Secondary | ICD-10-CM

## 2016-10-18 DIAGNOSIS — R5383 Other fatigue: Secondary | ICD-10-CM | POA: Diagnosis not present

## 2016-10-18 DIAGNOSIS — I951 Orthostatic hypotension: Principal | ICD-10-CM | POA: Diagnosis present

## 2016-10-18 DIAGNOSIS — I480 Paroxysmal atrial fibrillation: Secondary | ICD-10-CM | POA: Diagnosis not present

## 2016-10-18 DIAGNOSIS — M1712 Unilateral primary osteoarthritis, left knee: Secondary | ICD-10-CM | POA: Diagnosis present

## 2016-10-18 DIAGNOSIS — R29818 Other symptoms and signs involving the nervous system: Secondary | ICD-10-CM | POA: Diagnosis not present

## 2016-10-18 DIAGNOSIS — Z8679 Personal history of other diseases of the circulatory system: Secondary | ICD-10-CM

## 2016-10-18 DIAGNOSIS — I639 Cerebral infarction, unspecified: Secondary | ICD-10-CM | POA: Diagnosis not present

## 2016-10-18 DIAGNOSIS — I110 Hypertensive heart disease with heart failure: Secondary | ICD-10-CM | POA: Diagnosis present

## 2016-10-18 DIAGNOSIS — R55 Syncope and collapse: Secondary | ICD-10-CM | POA: Diagnosis present

## 2016-10-18 DIAGNOSIS — G459 Transient cerebral ischemic attack, unspecified: Secondary | ICD-10-CM | POA: Diagnosis not present

## 2016-10-18 LAB — URINALYSIS, ROUTINE W REFLEX MICROSCOPIC
BACTERIA UA: NONE SEEN
BILIRUBIN URINE: NEGATIVE
Glucose, UA: NEGATIVE mg/dL
Hgb urine dipstick: NEGATIVE
Ketones, ur: NEGATIVE mg/dL
LEUKOCYTES UA: NEGATIVE
NITRITE: NEGATIVE
PH: 7 (ref 5.0–8.0)
Protein, ur: 30 mg/dL — AB
Specific Gravity, Urine: 1.024 (ref 1.005–1.030)

## 2016-10-18 LAB — COMPREHENSIVE METABOLIC PANEL
ALK PHOS: 95 U/L (ref 38–126)
ALT: 25 U/L (ref 14–54)
ANION GAP: 10 (ref 5–15)
AST: 34 U/L (ref 15–41)
Albumin: 3.8 g/dL (ref 3.5–5.0)
BILIRUBIN TOTAL: 0.5 mg/dL (ref 0.3–1.2)
BUN: 22 mg/dL — ABNORMAL HIGH (ref 6–20)
CALCIUM: 9.4 mg/dL (ref 8.9–10.3)
CO2: 23 mmol/L (ref 22–32)
Chloride: 106 mmol/L (ref 101–111)
Creatinine, Ser: 1.23 mg/dL — ABNORMAL HIGH (ref 0.44–1.00)
GFR calc non Af Amer: 39 mL/min — ABNORMAL LOW (ref >60)
GFR, EST AFRICAN AMERICAN: 45 mL/min — AB (ref >60)
GLUCOSE: 113 mg/dL — AB (ref 65–99)
Potassium: 4.3 mmol/L (ref 3.5–5.1)
Sodium: 139 mmol/L (ref 135–145)
TOTAL PROTEIN: 6.4 g/dL — AB (ref 6.5–8.1)

## 2016-10-18 LAB — DIFFERENTIAL
Basophils Absolute: 0 10*3/uL (ref 0.0–0.1)
Basophils Relative: 0 %
EOS PCT: 1 %
Eosinophils Absolute: 0 10*3/uL (ref 0.0–0.7)
LYMPHS ABS: 1.2 10*3/uL (ref 0.7–4.0)
LYMPHS PCT: 18 %
MONO ABS: 0.6 10*3/uL (ref 0.1–1.0)
Monocytes Relative: 9 %
Neutro Abs: 4.6 10*3/uL (ref 1.7–7.7)
Neutrophils Relative %: 72 %

## 2016-10-18 LAB — CBC
HCT: 39.4 % (ref 36.0–46.0)
HEMOGLOBIN: 12.6 g/dL (ref 12.0–15.0)
MCH: 29.4 pg (ref 26.0–34.0)
MCHC: 32 g/dL (ref 30.0–36.0)
MCV: 92.1 fL (ref 78.0–100.0)
Platelets: 146 10*3/uL — ABNORMAL LOW (ref 150–400)
RBC: 4.28 MIL/uL (ref 3.87–5.11)
RDW: 14.7 % (ref 11.5–15.5)
WBC: 6.4 10*3/uL (ref 4.0–10.5)

## 2016-10-18 LAB — I-STAT CHEM 8, ED
BUN: 27 mg/dL — AB (ref 6–20)
CALCIUM ION: 1.13 mmol/L — AB (ref 1.15–1.40)
CREATININE: 1.1 mg/dL — AB (ref 0.44–1.00)
Chloride: 104 mmol/L (ref 101–111)
Glucose, Bld: 117 mg/dL — ABNORMAL HIGH (ref 65–99)
HCT: 40 % (ref 36.0–46.0)
Hemoglobin: 13.6 g/dL (ref 12.0–15.0)
Potassium: 4.3 mmol/L (ref 3.5–5.1)
SODIUM: 140 mmol/L (ref 135–145)
TCO2: 25 mmol/L (ref 22–32)

## 2016-10-18 LAB — RAPID URINE DRUG SCREEN, HOSP PERFORMED
Amphetamines: NOT DETECTED
Barbiturates: NOT DETECTED
Benzodiazepines: NOT DETECTED
Cocaine: NOT DETECTED
OPIATES: NOT DETECTED
TETRAHYDROCANNABINOL: NOT DETECTED

## 2016-10-18 LAB — PROTIME-INR
INR: 1.25
PROTHROMBIN TIME: 15.8 s — AB (ref 11.4–15.2)

## 2016-10-18 LAB — I-STAT TROPONIN, ED: Troponin i, poc: 0 ng/mL (ref 0.00–0.08)

## 2016-10-18 LAB — ETHANOL: Alcohol, Ethyl (B): <5 mg/dL (ref <5)

## 2016-10-18 LAB — CBG MONITORING, ED: GLUCOSE-CAPILLARY: 111 mg/dL — AB (ref 65–99)

## 2016-10-18 LAB — APTT: aPTT: 35 seconds (ref 24–36)

## 2016-10-18 MED ORDER — SERTRALINE HCL 50 MG PO TABS
25.0000 mg | ORAL_TABLET | Freq: Every day | ORAL | Status: DC
  Administered 2016-10-20 – 2016-10-21 (×2): 25 mg via ORAL
  Filled 2016-10-18 (×2): qty 1

## 2016-10-18 MED ORDER — LOSARTAN POTASSIUM 50 MG PO TABS: 100.0000 mg | ORAL_TABLET | Freq: Every day | ORAL | Status: DC

## 2016-10-18 MED ORDER — MUSCLE RUB 10-15 % EX CREA
1.0000 "application " | TOPICAL_CREAM | Freq: Four times a day (QID) | CUTANEOUS | Status: DC
  Administered 2016-10-20 – 2016-10-21 (×4): 1 via TOPICAL
  Filled 2016-10-18 (×2): qty 85

## 2016-10-18 MED ORDER — TRAMADOL HCL 50 MG PO TABS
25.0000 mg | ORAL_TABLET | Freq: Three times a day (TID) | ORAL | Status: DC | PRN
  Administered 2016-10-19: 25 mg via ORAL
  Filled 2016-10-18: qty 1

## 2016-10-18 MED ORDER — GABAPENTIN 100 MG PO CAPS: 100.0000 mg | ORAL_CAPSULE | Freq: Every day | ORAL | Status: DC

## 2016-10-18 MED ORDER — ACETAMINOPHEN 650 MG RE SUPP: 650.0000 mg | RECTAL | Status: DC | PRN

## 2016-10-18 MED ORDER — STROKE: EARLY STAGES OF RECOVERY BOOK
Freq: Once | Status: AC
Start: 2016-10-19 — End: 2016-10-18
  Administered 2016-10-18
  Filled 2016-10-18: qty 1

## 2016-10-18 MED ORDER — GADOBENATE DIMEGLUMINE 529 MG/ML IV SOLN
10.0000 mL | Freq: Once | INTRAVENOUS | Status: AC
  Administered 2016-10-18: 10 mL via INTRAVENOUS

## 2016-10-18 MED ORDER — LORAZEPAM 2 MG/ML IJ SOLN
1.0000 mg | Freq: Once | INTRAMUSCULAR | Status: AC | PRN
  Administered 2016-10-18: 1 mg via INTRAVENOUS
  Filled 2016-10-18: qty 1

## 2016-10-18 MED ORDER — SENNOSIDES-DOCUSATE SODIUM 8.6-50 MG PO TABS: 1.0000 | ORAL_TABLET | Freq: Every evening | ORAL | Status: DC | PRN

## 2016-10-18 MED ORDER — QUETIAPINE FUMARATE 25 MG PO TABS
25.0000 mg | ORAL_TABLET | Freq: Every day | ORAL | Status: DC
  Administered 2016-10-20 – 2016-10-21 (×2): 25 mg via ORAL
  Filled 2016-10-18 (×2): qty 1

## 2016-10-18 MED ORDER — AMIODARONE HCL 200 MG PO TABS: 200.0000 mg | ORAL_TABLET | Freq: Every day | ORAL | Status: DC

## 2016-10-18 MED ORDER — GABAPENTIN 100 MG PO CAPS
200.0000 mg | ORAL_CAPSULE | Freq: Every day | ORAL | Status: DC
  Administered 2016-10-19 – 2016-10-20 (×3): 200 mg via ORAL
  Filled 2016-10-18 (×3): qty 2

## 2016-10-18 MED ORDER — HYDRALAZINE HCL 20 MG/ML IJ SOLN
10.0000 mg | INTRAMUSCULAR | Status: DC | PRN
  Administered 2016-10-18: 10 mg via INTRAVENOUS
  Filled 2016-10-18: qty 1

## 2016-10-18 MED ORDER — IOPAMIDOL (ISOVUE-370) INJECTION 76%
INTRAVENOUS | Status: AC
  Administered 2016-10-18: 50 mL
  Filled 2016-10-18: qty 50

## 2016-10-18 MED ORDER — ACETAMINOPHEN 160 MG/5ML PO SOLN: 650.0000 mg | ORAL | Status: DC | PRN

## 2016-10-18 MED ORDER — LORAZEPAM 2 MG/ML IJ SOLN: 1.0000 mg | Freq: Once | INTRAMUSCULAR | Status: DC

## 2016-10-18 MED ORDER — APIXABAN 2.5 MG PO TABS
2.5000 mg | ORAL_TABLET | Freq: Two times a day (BID) | ORAL | Status: DC
  Administered 2016-10-19 – 2016-10-21 (×5): 2.5 mg via ORAL
  Filled 2016-10-18 (×5): qty 1

## 2016-10-18 MED ORDER — ACETAMINOPHEN 325 MG PO TABS: 650.0000 mg | ORAL_TABLET | ORAL | Status: DC | PRN

## 2016-10-18 MED ORDER — HYDRALAZINE HCL 10 MG PO TABS: 10.0000 mg | ORAL_TABLET | Freq: Two times a day (BID) | ORAL | Status: DC

## 2016-10-18 NOTE — ED Notes (Signed)
Pt incontinent of urine.  Pt cleaned and external cath applied.  Pt denies any complaints at this time

## 2016-10-18 NOTE — Code Documentation (Signed)
83yo female arriving to [facility] via [facility] at 26.  Patient from the nursing home with right sided deficits witnessed by staffed at 1306.  Code stroke activated by EMS.  Patient to CT on arrival.  Stroke team to the bedside.  CT completed.  NIHSS 2, see documentation for details and code stroke times.  Patient with left facial droop on exam which is baseline per report.  Patient on Eliquis and is contraindicated for tPA.  Symptoms resolved.  Bedside handoff with ED RN Elmyra Ricks.

## 2016-10-18 NOTE — ED Provider Notes (Signed)
[facility] DEPT Provider Note   CSN: [id number] Arrival date & time: 10/18/16  1402   An emergency department physician performed an initial assessment on this suspected stroke patient at 1402.  History   Chief Complaint Chief Complaint  Patient presents with  . Code Stroke    HPI Virginia Meyer is a 83 y.o. female.  HPI   83yo female with a history of hypertension, hyperlipidemia, major depressive disorder, carotid artery disease, prior CVA with left-sided weakness presents with concern for unresponsive episode. Right facial droop noted by EMS and possible right gaze deviation seen and Code Stroke called in the field. This resolved on arrival. Patient reports feeling fatigued, not herself this morning but denies specific symptoms.  Denies fever, urinary symptoms, cough, shortness of breath, chest pain, diarrhea, black or bloody stool. Reports she doesn't like the food at rehab but reports she has been eating or drinking well. Reports this AM she felt very tired, not herself, generally weak and remembers beginning lunch but doesn't remember what happened after.  Denies new numbness, weakness, speech difficulty.  Has left sided weakness since stroke.  She was hypotensive to 25W systolic when EMS arrived at facility.  Past Medical History:  Diagnosis Date  . Anxiety   . Fall   . GERD (gastroesophageal reflux disease)   . Heart failure ([place])   . HLD (hyperlipidemia)   . HTN (hypertension)   . Hyperlipidemia   . Hypotension   . Major depressive disorder   . Neuropathy   . PVD (peripheral vascular disease) ([place])   . Stroke [facility])    left sided deficit  . UTI (urinary tract infection)     Patient Active Problem List   Diagnosis Date Noted  . Decreased responsiveness 10/18/2016  . Carotid artery disease ([place]) 07/06/2016  . Gait abnormality 05/13/2016  . Cerebrovascular accident (CVA) due to thrombosis of right anterior cerebral artery ([place]) 05/13/2016  . Complete heart block  ([place]) 05/07/2016  . Benign essential HTN 08/21/2015  . Acute CVA (cerebrovascular accident) ([place]) 08/04/2015  . Left hemiparesis ([place]) 08/04/2015  . Primary osteoarthritis of left knee 08/04/2015  . Esophageal reflux 08/04/2015  . Hyperlipidemia LDL goal <70 08/04/2015  . Paroxysmal atrial fibrillation ([place]) 08/04/2015  . PVD (peripheral vascular disease) ([place]) 08/04/2015  . Protein-calorie malnutrition ([place]) 08/04/2015    Past Surgical History:  Procedure Laterality Date  . ABDOMINAL HYSTERECTOMY    . FOOT NEUROMA SURGERY    . SPINE SURGERY    . TONSILLECTOMY      OB History    No data available       Home Medications    Prior to Admission medications   Medication Sig Start Date End Date Taking? Authorizing Provider  acetaminophen (TYLENOL) 500 MG tablet Take 500 mg by mouth 4 (four) times daily. Left knee   Yes [provider]  amiodarone (PACERONE) 200 MG tablet Take 1 tablet by mouth twice daily for 10 days. Starting on 05/20/2016, only take 1 tablet once daily. Patient taking differently: Take 200 mg by mouth daily.  05/10/16  Yes Strader, [place] M, PA-C  apixaban (ELIQUIS) 2.5 MG TABS tablet Take 1 tablet (2.5 mg total) by mouth 2 (two) times daily. 10/16/15  Yes Rosalin Hawking, MD  gabapentin (NEURONTIN) 100 MG capsule Take 100-200 mg by mouth See admin instructions. Take 100 mg by mouth in the morning and take 200 mg by mouth at bedtime 10/02/15  Yes [provider]  hydrALAZINE (APRESOLINE) 10 MG tablet  Take 10 mg by mouth 2 (two) times daily.   Yes [provider]  losartan (COZAAR) 100 MG tablet Take 100 mg by mouth daily.   Yes [provider]  Menthol, Topical Analgesic, (BIOFREEZE) 4 % GEL Apply 1 application topically 4 (four) times daily. Left knee for pain    Yes [provider]  QUEtiapine (SEROQUEL) 25 MG tablet Take 25 mg by mouth daily.   Yes [provider]  sertraline (ZOLOFT) 25 MG tablet Take 25 mg by  mouth daily.   Yes [provider]  traMADol (ULTRAM) 50 MG tablet Take 25 mg by mouth every 8 (eight) hours as needed for moderate pain.   Yes [provider]  lisinopril (PRINIVIL,ZESTRIL) 40 MG tablet Take 1 tablet (40 mg total) by mouth daily. Patient not taking: Reported on 08/10/2016 05/12/16   Almyra Deforest, PA    Family History Family History  Problem Relation Age of Onset  . Heart attack Brother   . Stroke Neg Hx   . Neuropathy Neg Hx     Social History Social History  Substance Use Topics  . Smoking status: Never Smoker  . Smokeless tobacco: Never Used  . Alcohol use No     Allergies   Patient has no known allergies.   Review of Systems Review of Systems  Constitutional: Negative for fever.  HENT: Negative for sore throat.   Eyes: Negative for visual disturbance.  Respiratory: Negative for cough and shortness of breath.   Cardiovascular: Negative for chest pain.  Gastrointestinal: Negative for abdominal pain, blood in stool, constipation, diarrhea, nausea and vomiting.  Genitourinary: Negative for difficulty urinating.  Musculoskeletal: Negative for back pain and neck pain.  Skin: Negative for rash.  Neurological: Positive for facial asymmetry (per EMS). Negative for dizziness, seizures, syncope, speech difficulty, weakness and headaches.     Physical Exam Updated Vital Signs BP (!) 180/77   Pulse 68   Temp (!) 97.1 F (36.2 C)   Resp 14   SpO2 98%   Physical Exam  Constitutional: She is oriented to person, place, and time. She appears well-developed and well-nourished. No distress.  HENT:  Head: Normocephalic and atraumatic.  Eyes: Conjunctivae and EOM are normal.  Neck: Normal range of motion.  Cardiovascular: Normal rate, regular rhythm, normal heart sounds and intact distal pulses.  Exam reveals no gallop and no friction rub.   No murmur heard. Pulmonary/Chest: Effort normal and breath sounds normal. No respiratory distress. She has  no wheezes. She has no rales.  Abdominal: Soft. She exhibits no distension. There is no tenderness. There is no guarding.  Musculoskeletal: She exhibits no edema or tenderness.  Neurological: She is alert and oriented to person, place, and time. No cranial nerve deficit or sensory deficit. Coordination normal. GCS eye subscore is 4. GCS verbal subscore is 5. GCS motor subscore is 6.  Weakness left upper and left lower extremities (baseline per pt)  Skin: Skin is warm and dry. No rash noted. She is not diaphoretic. No erythema.  Nursing note and vitals reviewed.    ED Treatments / Results  Labs (all labs ordered are listed, but only abnormal results are displayed) Labs Reviewed  PROTIME-INR - Abnormal; Notable for the following:       Result Value   Prothrombin Time 15.8 (*)    All other components within normal limits  CBC - Abnormal; Notable for the following:    Platelets 146 (*)    All other components within  normal limits  COMPREHENSIVE METABOLIC PANEL - Abnormal; Notable for the following:    Glucose, Bld 113 (*)    BUN 22 (*)    Creatinine, Ser 1.23 (*)    Total Protein 6.4 (*)    GFR calc non Af Amer 39 (*)    GFR calc Af Amer 45 (*)    All other components within normal limits  URINALYSIS, ROUTINE W REFLEX MICROSCOPIC - Abnormal; Notable for the following:    Color, Urine STRAW (*)    Protein, ur 30 (*)    Squamous Epithelial / LPF 0-5 (*)    All other components within normal limits  I-STAT CHEM 8, ED - Abnormal; Notable for the following:    BUN 27 (*)    Creatinine, Ser 1.10 (*)    Glucose, Bld 117 (*)    Calcium, Ion 1.13 (*)    All other components within normal limits  CBG MONITORING, ED - Abnormal; Notable for the following:    Glucose-Capillary 111 (*)    All other components within normal limits  ETHANOL  APTT  DIFFERENTIAL  RAPID URINE DRUG SCREEN, HOSP PERFORMED  I-STAT TROPONIN, ED    EKG  EKG Interpretation  Date/Time:  Monday October 18 2016  14:20:17 EDT Ventricular Rate:  59 PR Interval:    QRS Duration: 108 QT Interval:  486 QTC Calculation: 482 R Axis:   16 Text Interpretation:  Sinus rhythm Incomplete left bundle branch block LVH with secondary repolarization abnormality Anterior Q waves, possibly due to LVH Since prior ECG, rate has slowed, rhythm is now sinus Confirmed by Gareth Morgan [phone]) on 10/18/2016 3:03:55 PM       Radiology Ct Angio Head W Or Wo Contrast  Result Date: 10/18/2016 CLINICAL DATA:  Follow-up LEFT facial droop. History of hypertension, hyperlipidemia and stroke. EXAM: CT ANGIOGRAPHY HEAD AND NECK TECHNIQUE: Multidetector CT imaging of the head and neck was performed using the standard protocol during bolus administration of intravenous contrast. Multiplanar CT image reconstructions and MIPs were obtained to evaluate the vascular anatomy. Carotid stenosis measurements (when applicable) are obtained utilizing NASCET criteria, using the distal internal carotid diameter as the denominator. CONTRAST:  50 cc Isovue 370 COMPARISON:  CT HEAD October 18, 2016 at 1411 hours FINDINGS: CTA NECK AORTIC ARCH: 3 vessel aortic arch. Moderate calcific atherosclerosis of aortic arch and arch vessel origins. Moderate stenosis LEFT Common carotid and LEFT subclavian artery origins. RIGHT Common carotid artery origin is widely patent. Mild tandem stenoses RIGHT subclavian artery due to atherosclerosis. RIGHT CAROTID SYSTEM: Common carotid artery is widely patent, mild calcific atherosclerosis. Calcific atherosclerosis resulting in short segment 50% stenosis RIGHT internal carotid artery origin. Internal carotid artery is patent with mild distal calcific atherosclerosis. LEFT CAROTID SYSTEM: Common carotid artery is widely patent. Calcific atherosclerosis resulting in short segment of 60% stenosis LEFT internal carotid artery origin. Normal appearance of the included internal carotid artery. VERTEBRAL ARTERIES:RIGHT vertebral  artery is dominant with tandem mild stenosis RIGHT P1 segment associated with calcific atherosclerosis. Moderate luminal irregularity RIGHT vertebral artery. RIGHT middle cerebral Diminutive LEFT vertebral artery. SKELETON: No acute osseous process though bone windows have not been submitted. Multilevel severe degenerative change of the cervical spine. OTHER NECK: Soft tissues of the neck are non-acute though, not tailored for evaluation. Subcentimeter hypodensity RIGHT thyroid lobe with scattered small calcifications, no indicated follow-up. CTA HEAD ANTERIOR CIRCULATION: Atherosclerosis resulting in moderate stenosis bilateral supraclinoid internal carotid artery's. Patent anterior communicating artery. Moderate tandem stenoses RIGHT mid  to distal anterior cerebral artery severe stenosis mid RIGHT A2 segment. Severe stenosis RIGHT proximal M2 superior division and inferior division. Moderate tandem stenoses bilateral mid to distal MCA segments. No large vessel occlusion, contrast extravasation or aneurysm. POSTERIOR CIRCULATION: Calcific atherosclerosis resulting in moderate stenoses RIGHT vertebral artery. Severely stenotic distal LEFT V4 segment. Diminutive basilar artery. Robust bilateral posterior communicating arteries with tandem severe stenoses bilateral posterior cerebral arteries. No large vessel occlusion, contrast extravasation or aneurysm. VENOUS SINUSES: Major dural venous sinuses are patent though not tailored for evaluation on this angiographic examination. ANATOMIC VARIANTS: Complete circle of Willis. DELAYED PHASE: No abnormal intracranial enhancement. MIP images reviewed. IMPRESSION: CTA NECK: 1. Atherosclerosis resulting in short segments 50% RIGHT and 60% LEFT stenosis internal carotid artery origins. 2. Moderate luminal irregularity RIGHT vertebral artery seen with atherosclerosis and old dissection without flow limiting stenosis. CTA head: 1. No emergent large vessel occlusion. 2. Moderate  intracranial atherosclerosis with superimposed scattered severe stenoses of anterior and posterior circulation. Electronically Signed   By: Elon Alas M.D.   On: 10/18/2016 17:01   Ct Angio Neck W Or Wo Contrast  Result Date: 10/18/2016 CLINICAL DATA:  Follow-up LEFT facial droop. History of hypertension, hyperlipidemia and stroke. EXAM: CT ANGIOGRAPHY HEAD AND NECK TECHNIQUE: Multidetector CT imaging of the head and neck was performed using the standard protocol during bolus administration of intravenous contrast. Multiplanar CT image reconstructions and MIPs were obtained to evaluate the vascular anatomy. Carotid stenosis measurements (when applicable) are obtained utilizing NASCET criteria, using the distal internal carotid diameter as the denominator. CONTRAST:  50 cc Isovue 370 COMPARISON:  CT HEAD October 18, 2016 at 1411 hours FINDINGS: CTA NECK AORTIC ARCH: 3 vessel aortic arch. Moderate calcific atherosclerosis of aortic arch and arch vessel origins. Moderate stenosis LEFT Common carotid and LEFT subclavian artery origins. RIGHT Common carotid artery origin is widely patent. Mild tandem stenoses RIGHT subclavian artery due to atherosclerosis. RIGHT CAROTID SYSTEM: Common carotid artery is widely patent, mild calcific atherosclerosis. Calcific atherosclerosis resulting in short segment 50% stenosis RIGHT internal carotid artery origin. Internal carotid artery is patent with mild distal calcific atherosclerosis. LEFT CAROTID SYSTEM: Common carotid artery is widely patent. Calcific atherosclerosis resulting in short segment of 60% stenosis LEFT internal carotid artery origin. Normal appearance of the included internal carotid artery. VERTEBRAL ARTERIES:RIGHT vertebral artery is dominant with tandem mild stenosis RIGHT P1 segment associated with calcific atherosclerosis. Moderate luminal irregularity RIGHT vertebral artery. RIGHT middle cerebral Diminutive LEFT vertebral artery. SKELETON: No acute  osseous process though bone windows have not been submitted. Multilevel severe degenerative change of the cervical spine. OTHER NECK: Soft tissues of the neck are non-acute though, not tailored for evaluation. Subcentimeter hypodensity RIGHT thyroid lobe with scattered small calcifications, no indicated follow-up. CTA HEAD ANTERIOR CIRCULATION: Atherosclerosis resulting in moderate stenosis bilateral supraclinoid internal carotid artery's. Patent anterior communicating artery. Moderate tandem stenoses RIGHT mid to distal anterior cerebral artery severe stenosis mid RIGHT A2 segment. Severe stenosis RIGHT proximal M2 superior division and inferior division. Moderate tandem stenoses bilateral mid to distal MCA segments. No large vessel occlusion, contrast extravasation or aneurysm. POSTERIOR CIRCULATION: Calcific atherosclerosis resulting in moderate stenoses RIGHT vertebral artery. Severely stenotic distal LEFT V4 segment. Diminutive basilar artery. Robust bilateral posterior communicating arteries with tandem severe stenoses bilateral posterior cerebral arteries. No large vessel occlusion, contrast extravasation or aneurysm. VENOUS SINUSES: Major dural venous sinuses are patent though not tailored for evaluation on this angiographic examination. ANATOMIC VARIANTS: Complete circle of Willis. DELAYED  PHASE: No abnormal intracranial enhancement. MIP images reviewed. IMPRESSION: CTA NECK: 1. Atherosclerosis resulting in short segments 50% RIGHT and 60% LEFT stenosis internal carotid artery origins. 2. Moderate luminal irregularity RIGHT vertebral artery seen with atherosclerosis and old dissection without flow limiting stenosis. CTA head: 1. No emergent large vessel occlusion. 2. Moderate intracranial atherosclerosis with superimposed scattered severe stenoses of anterior and posterior circulation. Electronically Signed   By: Elon Alas M.D.   On: 10/18/2016 17:01   Ct Head Code Stroke Wo Contrast  Result  Date: 10/18/2016 CLINICAL DATA:  Code stroke. Sudden onset of right-sided facial droop. Personal history of stroke. EXAM: CT HEAD WITHOUT CONTRAST TECHNIQUE: Contiguous axial images were obtained from the base of the skull through the vertex without intravenous contrast. COMPARISON:  CT head 05/17/2016.  He FINDINGS: [place]: A large remote right MCA territory infarct is stable. Atrophy and diffuse white matter changes are noted bilaterally. Knees are similar the prior exam. No acute cortical infarct, hemorrhage, or mass lesion is present. Remote ischemic changes involving the internal capsule and bowel my bilaterally are stable. Diffuse white matter changes extending into the brainstem are similar the prior study. Remote cerebellar infarcts are stable. The ventricles are proportionate to the degree of atrophy with ex vacuo dilation on the right. No significant extra-axial fluid collection is present. Vascular: Atherosclerotic calcifications are present within the cavernous internal carotid artery is bilaterally. Calcifications are present at the dural margin of the right vertebral artery. There is no hyperdense vessel. Skull: Multiple lucent lesions are present throughout the calvarium. These were present on previous studies. This raises concern for metastatic disease or multiple myeloma. Hemangiomas are considered less likely. Sinuses/Orbits: The paranasal sinuses and mastoid air cells are clear. Bilateral lens replacements are present. The globes and orbits are otherwise within normal limits. ASPECTS [facility] Stroke Program Early CT Score) - Ganglionic level infarction (caudate, lentiform nuclei, internal capsule, insula, M1-M3 cortex): 7/7 - Supraganglionic infarction (M4-M6 cortex): 3/3 Total score (0-10 with 10 being normal): 10/10 IMPRESSION: 1. Large remote right MCA territory nonhemorrhagic infarct is stable. 2. Remote ischemic changes involving the internal capsule, thalami, brainstem, and cerebellum are  stable. 3. No acute intracranial abnormality. 4. Atherosclerosis. 5. Lucent lesions throughout the calvarium raising concern for metastatic disease or myeloma. 6. ASPECTS is 10/10 These results were called by telephone at the time of interpretation on 10/18/2016 at 2:29 pm to Dr. Leonel Ramsay, who verbally acknowledged these results. Electronically Signed   By: San Morelle M.D.   On: 10/18/2016 14:30    Procedures Procedures (including critical care time)  Medications Ordered in ED Medications  hydrALAZINE (APRESOLINE) injection 10 mg (not administered)  iopamidol (ISOVUE-370) 76 % injection (50 mLs  Contrast Given 10/18/16 1547)  LORazepam (ATIVAN) injection 1 mg (1 mg Intravenous Given 10/18/16 2044)     Initial Impression / Assessment and Plan / ED Course  I have reviewed the triage vital signs and the nursing notes.  Pertinent labs & imaging results that were available during my care of the patient were reviewed by me and considered in my medical decision making (see chart for details).     83yo female with a history of hypertension, hyperlipidemia, major depressive disorder, carotid artery disease, prior CVA with left-sided weakness presents with concern for unresponsive episode. Right facial droop noted by EMS and possible right gaze deviation seen and Code Stroke called in the field. This resolved on arrival. CT head without acute abnormalities. No significant electrolyte abnormalities. No cp or  dyspnea and doubt PE/ACS.  Urinalysis pending.  Neurology present for code stroke, patient with resolved deficits, possible syncopal episode with carotid artery disease and temporary hypotension possibly responsible for episode of facial droop seen by EMS.  Unclear etiology of pt fatigue and unresponsive episode. Will admit for TIA work up and syncope.  Final Clinical Impressions(s) / ED Diagnoses   Final diagnoses:  Syncope, unspecified syncope type  Other fatigue  Transient cerebral  ischemia, unspecified type, possible    New Prescriptions New Prescriptions   No medications on file     Gareth Morgan, MD 10/18/16 2047

## 2016-10-18 NOTE — H&P (Signed)
Family [facility] Admission History and Physical Service Pager: [phone]  Patient name: Virginia Meyer Medical record number: [id number] Date of birth: Jul 04, 1928 Age: 83 y.o. Gender: female  Primary Care Provider: Florentina Jenny, MD Consultants: Neurology   Code Status: Partial (yes CPR and ACLS but no intubation, discussed on admission)  Chief Complaint: Decreased responsiveness and ?New Right facial droop  Assessment and Plan: Virginia Meyer is a 83 y.o. female presenting with episode of decreased responsiveness and ? New right facial droop . 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 fib on Xarelto, PVD, protein calorie malnutrition, hearing impairment  Episode of unresponsiveness and ?new right facial droop (resolved now): Vitals on admission show heart rate into the 50s, blood pressure widely ranging from 130-170s systolic/50-80s diastolic. Afebrile with documented low temp of 97.1. Electrolytes within normal limits, creatinine slightly elevated to 1.10 and BUN of 27. Normal glucose of 117. CBC unremarkable, no leukocytosis and normal hemoglobin of 12.6. EKG showing sinus rhythm with incomplete left bundle branch block and anterior Q waves, no acute ST changes. Troponin negative 1. CT head showing no acute stroke but an old right MCA stroke, remote ischemic change and internal capsule, thalami, brainstem, and cerebellum. CT head also showing concerns for ?Myeloma. Unclear the significance of this. Physical exam showing residual left-sided facial droop and decreased strength in LUE and LLE as well as baseline decreased strength in RLE. no new neurological deficits. Less likely new CVA. Of note patient had a similar episode in March 2018 per Epic review, she was admitted due to syncope and found to be in complete heart block, she has been following with cardiology. Leading differentials  include TIA vs syncope.  - Admit to feeling medicine teaching service, telemetry, attending Dr. McDiarmid - Vital signs per floor protocol - Telemetry - Troponins - c/s neuro stroke team, appreciate recs - MRI/MRA  - CTA Head and neck per neuro recs - A1c, Lipid panel pending - Echo ordered - Neuro checks q2 - c/s SLP, PT, OT - Passed stroke swallow scree, heart healthy diet - Obtain UA and CXR to rule out infectious etiology - Orthostatics  Hypertension: Widely ranging blood pressures while in ED; 130-170s systolic/50-80s diastolic. Home medications include hydralazine and cozaar.  -PRN Hydralazine for Systolic >200 and diastolic >110 -Resume home cozaar and hydralazine  Hx of complete heart block and Paroxysmal A. Fib: Currently normal sinus rhythm, rate controlled. EKG showing sinus rhythm and reading as incomplete left bundle branch block and anterior Q waves, no acute ST changes. Home medications include amiodarone and Eliquis. Hx of heart block possibly playing a role into possible syncopal episode that happened at ALF. -Continue amiodarone and Eliquis -Telemetry as above and troponins -Cards consult in AM  HFpEF, G1DD: March 2018 Echo: EF 65-70%, G1DD. Normal RV size  and systolic function. Moderate to severe left atrial enlargement. Mild aortic stenosis. - Continue cozaar   Hx of CVA: Residual left sided facial droop and decreased strength in LUE and LLE -Consider starting statin in AM, unsure why she has not been on one. Will need to clarify in AM with patient and daughter  Protein calorie malnutrition: On patient's problem list -Nutrition consult  Hyperlipidemia: Not on statin at home - Lipid panel - Consider adding statin in AM as above  Mild AKI: Cr 1.10. ?mild dehydration - Daily BMP - oral rehydration  - Watch Cr on Cozaar,  can stop if Cr rises  Urinary incontinence: Wears adult diaper at home - Continue adult diaper and consider female condom cath while  here  Wheelchair bound: Unable to safely ambulate at ALF. Uses wheelchair - PT/OT  Hx of Anxiety and MDD:  - Continue home Zoloft and Seroquel  Hx of OA and neuropathy - Continue home tramadol and gabapentin  FEN/GI: NPO until passes stroke swallow screen then heart healthy, SLIV  Prophylaxis: Home Eliquis  Disposition: Admit to family medicine teaching service, telemetry, attending Dr. McDiarmid  History of Present Illness:  Virginia Meyer is a 83 y.o. female presenting with episode of decreased responsiveness and? Right sided facial droop at ALF.   Patient was noted to be at her ALF today where she was last seen normal around 10 AM and then at around 1 PM staff and her found her unresponsive and slumped over in her wheelchair. According to the ED physician she also had systolic blood pressure in the 80s at that time. She had a difficult time responding to stimuli and apparently they were concerned about possible right-sided facial droop and right eye deviation. EMS was called and transported patient to [facility]. A code stroke was called when patient arrived to current hospital. Neurology examined patient did not appreciate any right-sided facial droop or eye deviation, but only her chronic left facial droop and weakness. CT head was performed and did not show any signs of stroke or bleeding.  Patient states she did not pass out today and was just "sleepy and didn't feel like talking with anyone".  According to patient's daughter, Virginia Meyer, patient is typically "ornery but very with it". Is usually oriented to person place and time. Daughter feels that patient looks very good today in the ED. She notes that she is wheelchair bound and needs assistance with ADLs such as using the toilet dressing, and bathing. Patient lives at [facility]. Patient's daughter also states that she has frequent episodes where she is slumped over in her chair but does not lose consciousness. Apparently  patient does this often because she "wants to be left alone".  Patient denies any fevers, new weakness, numbness, chest pain, shortness of breath, nausea, vomiting, abdominal pain, diarrhea, constipation, dysuria.  Review Of Systems: Per HPI with the following additions: see HPI  ROS  Patient Active Problem List   Diagnosis Date Noted  . Carotid artery disease ([place]) 07/06/2016  . Gait abnormality 05/13/2016  . Cerebrovascular accident (CVA) due to thrombosis of right anterior cerebral artery ([place]) 05/13/2016  . Complete heart block ([place]) 05/07/2016  . Benign essential HTN 08/21/2015  . Acute CVA (cerebrovascular accident) ([place]) 08/04/2015  . Left hemiparesis ([place]) 08/04/2015  . Primary osteoarthritis of left knee 08/04/2015  . Esophageal reflux 08/04/2015  . Hyperlipidemia LDL goal <70 08/04/2015  . Paroxysmal atrial fibrillation ([place]) 08/04/2015  . PVD (peripheral vascular disease) ([place]) 08/04/2015  . Protein-calorie malnutrition ([place]) 08/04/2015    Past Medical History: Past Medical History:  Diagnosis Date  . Anxiety   . Fall   . GERD (gastroesophageal reflux disease)   . Heart failure ([place])   . HLD (hyperlipidemia)   . HTN (hypertension)   . Hyperlipidemia   . Hypotension   . Major depressive disorder   . Neuropathy   . PVD (peripheral vascular disease) ([place])   . Stroke [facility])    left sided deficit  . UTI (urinary tract infection)     Past Surgical History: Past Surgical History:  Procedure Laterality Date  . ABDOMINAL HYSTERECTOMY    . FOOT NEUROMA SURGERY    . SPINE SURGERY    . TONSILLECTOMY      Social History: Social History  Substance Use Topics  . Smoking status: Never Smoker  . Smokeless tobacco: Never Used  . Alcohol use No   Please also refer to relevant sections of EMR.  Family History: Family History  Problem Relation Age of Onset  . Heart attack Brother   . Stroke Neg Hx   . Neuropathy Neg Hx     Allergies and Medications: No  Known Allergies No current facility-administered medications on file prior to encounter.    Current Outpatient Prescriptions on File Prior to Encounter  Medication Sig Dispense Refill  . acetaminophen (TYLENOL) 500 MG tablet Take 500 mg by mouth 4 (four) times daily. Left knee    . amiodarone (PACERONE) 200 MG tablet Take 1 tablet by mouth twice daily for 10 days. Starting on 05/20/2016, only take 1 tablet once daily. (Patient taking differently: Take 200 mg by mouth 2 (two) times daily. Take 1 tablet by mouth twice daily for 10 days. Starting on 05/20/2016, only take 1 tablet once daily.) 40 tablet 5  . apixaban (ELIQUIS) 2.5 MG TABS tablet Take 1 tablet (2.5 mg total) by mouth 2 (two) times daily. 60 tablet 2  . diphenhydrAMINE (BENADRYL) 2 % cream Apply 1 application topically at bedtime.    . gabapentin (NEURONTIN) 300 MG capsule Take 300-600 mg by mouth See admin instructions. Pt takes 381m daily, takes 6070mat bedtime    . hydrALAZINE (APRESOLINE) 10 MG tablet Take 10 mg by mouth 2 (two) times daily.    . [place] Kitchenisinopril (PRINIVIL,ZESTRIL) 40 MG tablet Take 1 tablet (40 mg total) by mouth daily. (Patient not taking: Reported on 08/10/2016) 30 tablet 11  . loperamide (IMODIUM A-D) 2 MG tablet Take 2 mg by mouth every 8 (eight) hours as needed for diarrhea or loose stools.    . meclizine (ANTIVERT) 12.5 MG tablet Take 12.5 mg by mouth every 8 (eight) hours as needed for dizziness.    . Menthol, Topical Analgesic, (BIOFREEZE) 4 % GEL Apply 1 application topically 4 (four) times daily. Left knee for pain     . Menthol-Zinc Oxide (CALMOSEPTINE) 0.44-20.6 % OINT Apply 1 application topically at bedtime as needed (for irritation).    . sertraline (ZOLOFT) 25 MG tablet Take 25 mg by mouth daily.    . traMADol (ULTRAM) 50 MG tablet Take 25 mg by mouth every 8 (eight) hours as needed for moderate pain.      Objective: BP (!) 131/39 (BP Location: Right Arm)   Pulse (!) 58   Temp (!) 97.1 F (36.2 C)    Resp 18   SpO2 100%  Exam: General: Frail elderly female laying in bed in no acute distress Eyes: Pupils equal and reactive to light, nonicteric sclera ENTM: Slightly dry membranes, no pharyngeal erythema Neck: No JVD Cardiovascular: Regular rate and rhythm, normal S1H6-W73/6 systolic murmur present, DP pulses palpable bilaterally Respiratory: Normal work of breathing, clear to auscultation bilaterally Gastrointestinal: Soft, nondistended, slight tenderness in suprapubic area, normal bowel sounds MSK: Moves all extremities spontaneously Derm: No rashes Psych: normal mood and affect Neurologic Exam Mental Status: alert; oriented to person, place, date and year; knowledge is normal for age; language is normal Cranial Nerves: Residual left sided facial droop noticed with asymetric smile, otherwise cranial nerves intact Motor: 5/5 in RUE, 4/5 RLE, 4/5  LUE,and 3/5 LLE; good fine motor movements; no pronator drift Sensory: grossly intact throughout Coordination: good finger-to-nose Gait and Station: not observed. Patient in wheelchair at baseline Reflexes: no clonus  Labs and Imaging: CBC BMET   Recent Labs Lab 10/18/16 1403 10/18/16 1413  WBC 6.4  --   HGB 12.6 13.6  HCT 39.4 40.0  PLT 146*  --     Recent Labs Lab 10/18/16 1403 10/18/16 1413  NA 139 140  K 4.3 4.3  CL 106 104  CO2 23  --   BUN 22* 27*  CREATININE 1.23* 1.10*  GLUCOSE 113* 117*  CALCIUM 9.4  --      Ct Head Code Stroke Wo Contrast  Result Date: 10/18/2016 CLINICAL DATA:  Code stroke. Sudden onset of right-sided facial droop. Personal history of stroke. EXAM: CT HEAD WITHOUT CONTRAST TECHNIQUE: Contiguous axial images were obtained from the base of the skull through the vertex without intravenous contrast. COMPARISON:  CT head 05/17/2016.  He FINDINGS: Stum: A large remote right MCA territory infarct is stable. Atrophy and diffuse white matter changes are noted bilaterally. Knees are similar the prior  exam. No acute cortical infarct, hemorrhage, or mass lesion is present. Remote ischemic changes involving the internal capsule and bowel my bilaterally are stable. Diffuse white matter changes extending into the brainstem are similar the prior study. Remote cerebellar infarcts are stable. The ventricles are proportionate to the degree of atrophy with ex vacuo dilation on the right. No significant extra-axial fluid collection is present. Vascular: Atherosclerotic calcifications are present within the cavernous internal carotid artery is bilaterally. Calcifications are present at the dural margin of the right vertebral artery. There is no hyperdense vessel. Skull: Multiple lucent lesions are present throughout the calvarium. These were present on previous studies. This raises concern for metastatic disease or multiple myeloma. Hemangiomas are considered less likely. Sinuses/Orbits: The paranasal sinuses and mastoid air cells are clear. Bilateral lens replacements are present. The globes and orbits are otherwise within normal limits. ASPECTS [facility] Stroke Program Early CT Score) - Ganglionic level infarction (caudate, lentiform nuclei, internal capsule, insula, M1-M3 cortex): 7/7 - Supraganglionic infarction (M4-M6 cortex): 3/3 Total score (0-10 with 10 being normal): 10/10 IMPRESSION: 1. Large remote right MCA territory nonhemorrhagic infarct is stable. 2. Remote ischemic changes involving the internal capsule, thalami, brainstem, and cerebellum are stable. 3. No acute intracranial abnormality. 4. Atherosclerosis. 5. Lucent lesions throughout the calvarium raising concern for metastatic disease or myeloma. 6. ASPECTS is 10/10 These results were called by telephone at the time of interpretation on 10/18/2016 at 2:29 pm to Dr. Leonel Ramsay, who verbally acknowledged these results. Electronically Signed   By: San Morelle M.D.   On: 10/18/2016 14:30     Carlyle Dolly, MD 10/18/2016, 3:24 PM PGY-3,  [facility] pager: [phone], text pages welcome

## 2016-10-18 NOTE — ED Triage Notes (Signed)
Pt arrives via EMS from skilled nursing facility where patient was eating lunch and suddenly developed right sided facial droop. Per EMs symptoms resolved in route. Pt with hx of stroke on left. Pt currently on eliquis for a fib. Currently SR. Pt awake, alert, oriented x4, VAN negative. VSS.

## 2016-10-18 NOTE — ED Notes (Signed)
Pt to CT at this time.

## 2016-10-18 NOTE — ED Notes (Signed)
Patient transported to MRI 

## 2016-10-18 NOTE — Consult Note (Signed)
Requesting Physician: Dr. Billy Fischer    Chief Complaint: Code Stroke  History obtained from: ems and nursing home  HPI:                                                                                                                                         Virginia Meyer is an 83 y.o. female who currently is on Eliquis for previous right MCA stroke. Patient is nursing home resident and is wheelchair bound at baseline. Apparently talking to staff members she was last seen normal around 10 AM and her baseline. At approximately 1330 staff upstairs and found patient unresponsive, slumped over in her wheelchair, and barely responding to noxious stimuli. EMS was called. In route EMS had noted a possible right facial droop and possible right gaze deviation however this had fully resolved at the time of arrival to [facility]. CT scan of head was obtained and did not show any acute bleed or infarct. Patient has known 70% stenosis of left ICA.  At this time patient is not a TPA candidate due to being on anticoagulants and also resolved symptoms.  Date last known well: Date: 10/18/2016 Time last known well: Time: 10:00 tPA Given: No: on eliquis and symptoms Modified Rankin: Rankin Score=4    Past Medical History:  Diagnosis Date  . Anxiety   . Fall   . GERD (gastroesophageal reflux disease)   . Heart failure ([place])   . HLD (hyperlipidemia)   . HTN (hypertension)   . Hyperlipidemia   . Hypotension   . Major depressive disorder   . Neuropathy   . PVD (peripheral vascular disease) ([place])   . Stroke [facility])    left sided deficit  . UTI (urinary tract infection)     Past Surgical History:  Procedure Laterality Date  . ABDOMINAL HYSTERECTOMY    . FOOT NEUROMA SURGERY    . SPINE SURGERY    . TONSILLECTOMY      Family History  Problem Relation Age of Onset  . Heart attack Brother   . Stroke Neg Hx   . Neuropathy Neg Hx    Social History:  reports that she has never smoked. She has  never used smokeless tobacco. She reports that she does not drink alcohol or use drugs.  Allergies: No Known Allergies  Medications:  No current facility-administered medications for this encounter.    Current Outpatient Prescriptions  Medication Sig Dispense Refill  . acetaminophen (TYLENOL) 500 MG tablet Take 500 mg by mouth 4 (four) times daily. Left knee    . amiodarone (PACERONE) 200 MG tablet Take 1 tablet by mouth twice daily for 10 days. Starting on 05/20/2016, only take 1 tablet once daily. (Patient taking differently: Take 200 mg by mouth 2 (two) times daily. Take 1 tablet by mouth twice daily for 10 days. Starting on 05/20/2016, only take 1 tablet once daily.) 40 tablet 5  . apixaban (ELIQUIS) 2.5 MG TABS tablet Take 1 tablet (2.5 mg total) by mouth 2 (two) times daily. 60 tablet 2  . diphenhydrAMINE (BENADRYL) 2 % cream Apply 1 application topically at bedtime.    . gabapentin (NEURONTIN) 300 MG capsule Take 300-600 mg by mouth See admin instructions. Pt takes 300mg  daily, takes 600mg  at bedtime    . hydrALAZINE (APRESOLINE) 10 MG tablet Take 10 mg by mouth 2 (two) times daily.    Marland Kitchen lisinopril (PRINIVIL,ZESTRIL) 40 MG tablet Take 1 tablet (40 mg total) by mouth daily. (Patient not taking: Reported on 08/10/2016) 30 tablet 11  . loperamide (IMODIUM A-D) 2 MG tablet Take 2 mg by mouth every 8 (eight) hours as needed for diarrhea or loose stools.    . meclizine (ANTIVERT) 12.5 MG tablet Take 12.5 mg by mouth every 8 (eight) hours as needed for dizziness.    . Menthol, Topical Analgesic, (BIOFREEZE) 4 % GEL Apply 1 application topically 4 (four) times daily. Left knee for pain     . Menthol-Zinc Oxide (CALMOSEPTINE) 0.44-20.6 % OINT Apply 1 application topically at bedtime as needed (for irritation).    . sertraline (ZOLOFT) 25 MG tablet Take 25 mg by mouth daily.    .  traMADol (ULTRAM) 50 MG tablet Take 25 mg by mouth every 8 (eight) hours as needed for moderate pain.       ROS:                                                                                                                                       History obtained from the patient  General ROS: negative for - chills, fatigue, fever, night sweats, weight gain or weight loss Psychological ROS: negative for - behavioral disorder, hallucinations, memory difficulties, mood swings or suicidal ideation Ophthalmic ROS: negative for - blurry vision, double vision, eye pain or loss of vision ENT ROS: negative for - epistaxis, nasal discharge, oral lesions, sore throat, tinnitus or vertigo Allergy and Immunology ROS: negative for - hives or itchy/watery eyes Hematological and Lymphatic ROS: negative for - bleeding problems, bruising or swollen lymph nodes Endocrine ROS: negative for - galactorrhea, hair pattern changes, polydipsia/polyuria or temperature intolerance Respiratory ROS: negative for - cough, hemoptysis, shortness of breath or wheezing Cardiovascular ROS: negative for - chest pain, dyspnea on exertion,  edema or irregular heartbeat Gastrointestinal ROS: negative for - abdominal pain, diarrhea, hematemesis, nausea/vomiting or stool incontinence Genito-Urinary ROS: negative for - dysuria, hematuria, incontinence or urinary frequency/urgency Musculoskeletal ROS: negative for - joint swelling or muscular weakness Neurological ROS: as noted in HPI Dermatological ROS: negative for rash and skin lesion changes  Neurologic Examination:                                                                                                      Blood pressure (!) 131/39, pulse (!) 58, temperature (!) 97.1 F (36.2 C), resp. rate 18, SpO2 100 %.  HEENT-  Normocephalic, no lesions, without obvious abnormality.  Normal external eye and conjunctiva.  Normal TM's bilaterally.  Normal auditory canals and external  ears. Normal external nose, mucus membranes and septum.  Normal pharynx. Cardiovascular- S1, S2 normal, pulses palpable throughout   Lungs- chest clear, no wheezing, rales, normal symmetric air entry Abdomen- normal findings: bowel sounds normal Extremities- no edema Lymph-no adenopathy palpable Musculoskeletal-no joint tenderness, deformity or swelling Skin-warm and dry, no hyperpigmentation, vitiligo, or suspicious lesions  Neurological Examination Mental Status: Alert, oriented, thought content appropriate.  Speech fluent without evidence of aphasia.  Able to follow 3 step commands without difficulty. Cranial Nerves: II: Visual fields grossly normal,  III,IV, VI: ptosis not present, extra-ocular motions intact bilaterally, pupils equal, round, reactive to light and accommodation V,VII: smile asymmetric on the left however this is old from previous right MCA stroke, facial light touch sensation normal bilaterally VIII: hearing normal bilaterally IX,X: uvula rises symmetrically XI: bilateral shoulder shrug XII: midline tongue extension Motor: Right : Upper extremity   5/5    Left:     Upper extremity   4/5  Lower extremity   3/5     Lower extremity   3/5 --Patient is wheelchair bound at baseline and has 3/5 strength throughout her lower extremities with her left being greater than right. Tone and bulk:normal tone throughout; no atrophy noted Sensory: Pinprick and light touch intact throughout, bilaterally Deep Tendon Reflexes: 1+ and symmetric throughout Plantars: Right: downgoing   Left: downgoing Cerebellar: normal finger-to-nose Gait: Not tested and wheelchair bound at baseline       Lab Results: Basic Metabolic Panel:  Recent Labs Lab 10/18/16 1413  NA 140  K 4.3  CL 104  GLUCOSE 117*  BUN 27*  CREATININE 1.10*    Liver Function Tests: No results for input(s): AST, ALT, ALKPHOS, BILITOT, PROT, ALBUMIN in the last 168 hours. No results for input(s): LIPASE,  AMYLASE in the last 168 hours. No results for input(s): AMMONIA in the last 168 hours.  CBC:  Recent Labs Lab 10/18/16 1403 10/18/16 1413  WBC 6.4  --   NEUTROABS 4.6  --   HGB 12.6 13.6  HCT 39.4 40.0  MCV 92.1  --   PLT 146*  --     Cardiac Enzymes: No results for input(s): CKTOTAL, CKMB, CKMBINDEX, TROPONINI in the last 168 hours.  Lipid Panel: No results for input(s): CHOL, TRIG, HDL, CHOLHDL, VLDL, LDLCALC in the last 168  hours.  CBG:  Recent Labs Lab 10/18/16 1405  GLUCAP 111*    Microbiology: Results for orders placed or performed during the hospital encounter of 05/07/16  Blood Culture (routine x 2)     Status: None   Collection Time: 05/07/16 12:42 PM  Result Value Ref Range Status   Specimen Description BLOOD RIGHT HAND  Final   Special Requests BOTTLES DRAWN AEROBIC ONLY  5CC  Final   Culture NO GROWTH 5 DAYS  Final   Report Status 05/12/2016 FINAL  Final  Blood Culture (routine x 2)     Status: None   Collection Time: 05/07/16 12:52 PM  Result Value Ref Range Status   Specimen Description BLOOD RIGHT ANTECUBITAL  Final   Special Requests IN PEDIATRIC BOTTLE  2CC  Final   Culture NO GROWTH 5 DAYS  Final   Report Status 05/12/2016 FINAL  Final  MRSA PCR Screening     Status: None   Collection Time: 05/07/16  4:20 PM  Result Value Ref Range Status   MRSA by PCR NEGATIVE NEGATIVE Final    Comment:        The GeneXpert MRSA Assay (FDA approved for NASAL specimens only), is one component of a comprehensive MRSA colonization surveillance program. It is not intended to diagnose MRSA infection nor to guide or monitor treatment for MRSA infections.     Coagulation Studies:  Recent Labs  10/18/16 1403  LABPROT 15.8*  INR 1.25    Imaging: No results found.     Assessment and plan discussed with with attending physician and they are in agreement.    Etta Quill PA-C [facility] [phone]  10/18/2016, 2:29 PM    Assessment: 83 y.o. female presenting to [facility] after nursing home found patient unresponsive and slumped in her wheelchair due to unknown etiology at this point in time. If she had a focal stenosis, then this could explain focal symptoms following a generalized hypoperfusion event, and I do wonder if hypotension was the driver of this event. Seizure would be another reason for her to be unresponsive, and would favor assessing for this with EEG.  I also think it would be prudent to ensure we are appropriately managing her secondary stroke risk factors.  Stroke Risk Factors - hyperlipidemia and hypertension  Recommend:  1. HgbA1c, fasting lipid panel 2. MRI of the [place] with/without contrast/ CTA head and neck 3. PT consult, OT consult, Speech consult 4. Echocardiogram 5.  Atorvistatin 6. Prophylactic therapy-Antiplatelet MPN:TIRWERX AC 7. Risk factor modification 8. Telemetry monitoring 9. Frequent neuro checks 10 NPO until passes stroke swallow screen 11 EEG 12 neurology to follow   Roland Rack, MD [facility] [phone]  If 7pm- 7am, please page neurology on call as listed in [place].

## 2016-10-19 ENCOUNTER — Ambulatory Visit: Payer: Medicare HMO | Admitting: Physical Therapy

## 2016-10-19 ENCOUNTER — Inpatient Hospital Stay (HOSPITAL_COMMUNITY): Payer: Medicare HMO

## 2016-10-19 DIAGNOSIS — R55 Syncope and collapse: Secondary | ICD-10-CM

## 2016-10-19 DIAGNOSIS — I63549 Cerebral infarction due to unspecified occlusion or stenosis of unspecified cerebellar artery: Secondary | ICD-10-CM | POA: Diagnosis present

## 2016-10-19 DIAGNOSIS — E440 Moderate protein-calorie malnutrition: Secondary | ICD-10-CM

## 2016-10-19 DIAGNOSIS — R54 Age-related physical debility: Secondary | ICD-10-CM

## 2016-10-19 DIAGNOSIS — I6930 Unspecified sequelae of cerebral infarction: Secondary | ICD-10-CM

## 2016-10-19 DIAGNOSIS — G8194 Hemiplegia, unspecified affecting left nondominant side: Secondary | ICD-10-CM

## 2016-10-19 DIAGNOSIS — R4189 Other symptoms and signs involving cognitive functions and awareness: Secondary | ICD-10-CM

## 2016-10-19 DIAGNOSIS — I10 Essential (primary) hypertension: Secondary | ICD-10-CM

## 2016-10-19 DIAGNOSIS — M899 Disorder of bone, unspecified: Secondary | ICD-10-CM

## 2016-10-19 DIAGNOSIS — I480 Paroxysmal atrial fibrillation: Secondary | ICD-10-CM

## 2016-10-19 DIAGNOSIS — I352 Nonrheumatic aortic (valve) stenosis with insufficiency: Secondary | ICD-10-CM

## 2016-10-19 DIAGNOSIS — I672 Cerebral atherosclerosis: Secondary | ICD-10-CM | POA: Diagnosis present

## 2016-10-19 DIAGNOSIS — Z8679 Personal history of other diseases of the circulatory system: Secondary | ICD-10-CM

## 2016-10-19 DIAGNOSIS — I959 Hypotension, unspecified: Secondary | ICD-10-CM | POA: Diagnosis present

## 2016-10-19 DIAGNOSIS — I700 Atherosclerosis of aorta: Secondary | ICD-10-CM

## 2016-10-19 DIAGNOSIS — I779 Disorder of arteries and arterioles, unspecified: Secondary | ICD-10-CM

## 2016-10-19 DIAGNOSIS — I739 Peripheral vascular disease, unspecified: Secondary | ICD-10-CM

## 2016-10-19 DIAGNOSIS — R001 Bradycardia, unspecified: Secondary | ICD-10-CM

## 2016-10-19 DIAGNOSIS — N179 Acute kidney failure, unspecified: Secondary | ICD-10-CM

## 2016-10-19 LAB — BASIC METABOLIC PANEL
Anion gap: 9 (ref 5–15)
BUN: 21 mg/dL — AB (ref 6–20)
CALCIUM: 9.5 mg/dL (ref 8.9–10.3)
CO2: 25 mmol/L (ref 22–32)
CREATININE: 1 mg/dL (ref 0.44–1.00)
Chloride: 106 mmol/L (ref 101–111)
GFR calc Af Amer: 58 mL/min — ABNORMAL LOW (ref >60)
GFR calc non Af Amer: 50 mL/min — ABNORMAL LOW (ref >60)
GLUCOSE: 91 mg/dL (ref 65–99)
Potassium: 3.8 mmol/L (ref 3.5–5.1)
Sodium: 140 mmol/L (ref 135–145)

## 2016-10-19 LAB — URINALYSIS, COMPLETE (UACMP) WITH MICROSCOPIC
Bilirubin Urine: NEGATIVE
Glucose, UA: NEGATIVE mg/dL
Hgb urine dipstick: NEGATIVE
KETONES UR: NEGATIVE mg/dL
Leukocytes, UA: NEGATIVE
Nitrite: NEGATIVE
PH: 6 (ref 5.0–8.0)
Protein, ur: 30 mg/dL — AB
SPECIFIC GRAVITY, URINE: 1.036 — AB (ref 1.005–1.030)

## 2016-10-19 LAB — CBC
HEMATOCRIT: 38.7 % (ref 36.0–46.0)
Hemoglobin: 12.6 g/dL (ref 12.0–15.0)
MCH: 29.7 pg (ref 26.0–34.0)
MCHC: 32.6 g/dL (ref 30.0–36.0)
MCV: 91.3 fL (ref 78.0–100.0)
Platelets: 166 10*3/uL (ref 150–400)
RBC: 4.24 MIL/uL (ref 3.87–5.11)
RDW: 15.1 % (ref 11.5–15.5)
WBC: 6.4 10*3/uL (ref 4.0–10.5)

## 2016-10-19 LAB — ECHOCARDIOGRAM COMPLETE
HEIGHTINCHES: 60 in
WEIGHTICAEL: 1876.56 [oz_av]

## 2016-10-19 LAB — LIPID PANEL
CHOL/HDL RATIO: 7.2 ratio
Cholesterol: 281 mg/dL — ABNORMAL HIGH (ref 0–200)
HDL: 39 mg/dL — AB (ref >40)
LDL CALC: 218 mg/dL — AB (ref 0–99)
Triglycerides: 121 mg/dL (ref <150)
VLDL: 24 mg/dL (ref 0–40)

## 2016-10-19 LAB — TSH: TSH: 1.276 u[IU]/mL (ref 0.350–4.500)

## 2016-10-19 LAB — HEMOGLOBIN A1C
HEMOGLOBIN A1C: 5.3 % (ref 4.8–5.6)
Mean Plasma Glucose: 105.41 mg/dL

## 2016-10-19 LAB — TROPONIN I: Troponin I: <0.03 ng/mL (ref <0.03)

## 2016-10-19 LAB — MRSA PCR SCREENING: MRSA by PCR: POSITIVE — AB

## 2016-10-19 MED ORDER — MUPIROCIN 2 % EX OINT
1.0000 "application " | TOPICAL_OINTMENT | Freq: Two times a day (BID) | CUTANEOUS | Status: DC
  Administered 2016-10-19 – 2016-10-21 (×4): 1 via NASAL
  Filled 2016-10-19: qty 22

## 2016-10-19 MED ORDER — LOSARTAN POTASSIUM 50 MG PO TABS
25.0000 mg | ORAL_TABLET | Freq: Every day | ORAL | Status: DC
  Administered 2016-10-19 – 2016-10-20 (×2): 25 mg via ORAL
  Filled 2016-10-19 (×2): qty 1

## 2016-10-19 MED ORDER — LISINOPRIL 10 MG PO TABS: 10.0000 mg | ORAL_TABLET | Freq: Every day | ORAL | Status: DC

## 2016-10-19 MED ORDER — BOOST PLUS PO LIQD
237.0000 mL | Freq: Every day | ORAL | Status: DC
  Administered 2016-10-21: 237 mL via ORAL
  Filled 2016-10-19 (×5): qty 237

## 2016-10-19 MED ORDER — PERFLUTREN LIPID MICROSPHERE
1.0000 mL | INTRAVENOUS | Status: AC | PRN
  Filled 2016-10-19 (×2): qty 10

## 2016-10-19 MED ORDER — AMIODARONE HCL 200 MG PO TABS
100.0000 mg | ORAL_TABLET | Freq: Every day | ORAL | Status: DC
  Administered 2016-10-20 – 2016-10-21 (×2): 100 mg via ORAL
  Filled 2016-10-19 (×2): qty 1

## 2016-10-19 MED ORDER — CHLORHEXIDINE GLUCONATE CLOTH 2 % EX PADS
6.0000 | MEDICATED_PAD | Freq: Every day | CUTANEOUS | Status: DC
  Administered 2016-10-21: 6 via TOPICAL

## 2016-10-19 NOTE — Progress Notes (Signed)
SLP Cancellation Note  Patient Details Name: Virginia Meyer MRN: [id number] DOB: 07-Jun-1928   Cancelled treatment:       Reason Eval/Treat Not Completed: SLP screened, no needs identified, will sign off   Margi Edmundson, Katherene Ponto 10/19/2016, 9:12 AM

## 2016-10-19 NOTE — Progress Notes (Signed)
Subjective: No complaints.   Exam: Vitals:   10/19/16 0546 10/19/16 0800  BP: (!) 110/47 (!) 121/51  Pulse: 64 66  Resp: 20 (!) 22  Temp: 98.3 F (36.8 C) 97.9 F (36.6 C)  SpO2: 94% 96%    HEENT-  Normocephalic, no lesions, without obvious abnormality.  Normal external eye and conjunctiva.  Normal TM's bilaterally.  Normal auditory canals and external ears. Normal external nose, mucus membranes and septum.  Normal pharynx. Cardiovascular- S1, S2 normal, pulses palpable throughout   Lungs- chest clear, no wheezing, rales, normal symmetric air entry, Heart exam - S1, S2 normal, no murmur, no gallop, rate regular Abdomen- normal findings: bowel sounds normal Extremities- no edema Lymph-no adenopathy palpable Musculoskeletal-no joint tenderness, deformity or swelling Skin-warm and dry, no hyperpigmentation, vitiligo, or suspicious lesions   Neuro:  CN: Pupils are equal and round. They are symmetrically reactive from 3-->2 mm. EOMI without nystagmus. Facial sensation is intact to light touch. Face is asymmetric on the leftat rest with normal strength and mobility. Hearing is intact to conversational voice. Palate elevates symmetrically and uvula is midline. Voice is normal in tone, pitch and quality. Bilateral SCM and trapezii are 5/5. Tongue is midline with normal bulk and mobility.  Motor: Right :  Upper extremity   5/5                                      Left:     Upper extremity   4/5             Lower extremity   3/5                                                  Lower extremity   3/5 --Patient is wheelchair bound at baseline and has 3/5 strength throughout her lower extremities with her left being greater than right. Sensation: Intact to light touch.  DTRs: 1+, symmetric  Toes downgoing bilaterally. Coordination: Finger-to-nose      Pertinent Labs/Diagnostics:  Blood Pressure: has been waxing and waning--yesterday between 225-130/80-39  Today more stable 121/51 A1c  5.3 LDL -218  MRI [place]: 1. No acute intracranial process on this motion degraded examination. 2. Old Large RIGHT MCA territory infarct. Old small cerebellar infarcts. 3. Moderate to severe chronic small vessel ischemic disease. 4. Enhancing LEFT frontal calvarial 2.1 cm lesion. Heterogeneous calvarial bone marrow signal. These are nonspecific, metastatic disease not excluded. Bone scan could be performed as clinically indicated though, may not be scintigraphically demonstrated.  CTA head and neck:  1. Atherosclerosis resulting in short segments 50% RIGHT and 60% LEFT stenosis internal carotid artery origins. 2. Moderate luminal irregularity RIGHT vertebral artery seen with atherosclerosis and old dissection without flow limiting stenosis. CTA head:  1. No emergent large vessel occlusion. 2. Moderate intracranial atherosclerosis with superimposed scattered severe stenoses of anterior and posterior circulation  EEG pending     Etta Quill PA-C [facility] [phone]  Impression:  83 y.o. female presenting to [facility] after unresponsive episode of unclear etiology. I think that TIA is unlikley, but with her history of stroke, I would favor treatment of her elevated LDL. Sz is possible, but with the description, I am not comfortable starting an anti-epileptic drug with this event.  Cardiac syncope would also be a possibility.   Recommendations: 1) Goal LDL < 70 2) continue apixaban(low dose due to age > 2 and weight < 50kg) 3) no AED at this time.   4) Agree with echo, tele 5) No further recommendations at this time. Please call with further questions or  Concerns.   Roland Rack, MD [facility] [phone]  If 7pm- 7am, please page neurology on call as listed in [place].  10/19/2016, 9:12 AM

## 2016-10-19 NOTE — NC FL2 (Signed)
[place] LEVEL OF CARE SCREENING TOOL     IDENTIFICATION  Patient Name: Virginia Meyer Birthdate: [birth year] Sex: female Admission Date (Current Location): 10/18/2016  [facility] and [place] Number:  Engineering geologist and Address:  [facility] [place]. [facility], [place] [address] [place], [place] [address]      Provider Number: [id number]  Attending Physician Name and Address:  McDiarmid, Blane Ohara, MD  Relative Name and Phone Number:       Current Level of Care: Hospital Recommended Level of Care: [place] Prior Approval Number:    Date Approved/Denied:   PASRR Number:    Discharge Plan: Other (Comment) (ALF)    Current Diagnoses: Patient Active Problem List   Diagnosis Date Noted  . Bradycardia 10/19/2016  . Hypotension 10/19/2016  . Skull lesion 10/19/2016  . History of cerebrovascular accident (CVA) with residual deficit 10/19/2016  . Personal history of atrial flutter 10/19/2016  . Thoracic aortic atherosclerosis ([place]) 10/19/2016  . Intracranial atherosclerosis 10/19/2016  . Frailty syndrome in geriatric patient 10/19/2016  . Acute kidney injury ([place]) 10/19/2016  . 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 Syncope, Recurrent 10/18/2016  . Carotid artery disease ([place]) 07/06/2016  . Gait abnormality 05/13/2016  . Cerebrovascular accident (CVA) due to thrombosis of right anterior cerebral artery ([place]) 05/13/2016  . History of heart block 05/07/2016  . Benign labile hypertension 08/21/2015  . Acute CVA (cerebrovascular accident) ([place]) 08/04/2015  . Left hemiparesis ([place]) 08/04/2015  . Primary osteoarthritis of left knee 08/04/2015  . Esophageal reflux 08/04/2015  . Hyperlipidemia LDL goal <70 08/04/2015  . Paroxysmal atrial fibrillation ([place]) 08/04/2015  . PVD (peripheral vascular disease) ([place]) 08/04/2015  . Protein-calorie malnutrition ([place]) 08/04/2015    Orientation RESPIRATION BLADDER Height &  Weight     Self, Situation, Place  Normal Incontinent, External catheter Weight: 117 lb 4.6 oz (53.2 kg) Height:  5' (152.4 cm)  BEHAVIORAL SYMPTOMS/MOOD NEUROLOGICAL BOWEL NUTRITION STATUS       (unknown) Diet (NAS)  AMBULATORY STATUS COMMUNICATION OF NEEDS Skin   Extensive Assist Verbally Normal                       Personal Care Assistance Level of Assistance  Bathing, Dressing Bathing Assistance: Maximum assistance   Dressing Assistance: Maximum assistance     Functional Limitations Info             SPECIAL CARE FACTORS FREQUENCY  PT (By licensed PT), OT (By licensed OT)     PT Frequency: 3x/wk with home health OT Frequency: 3x/wk with home health            Contractures      Additional Factors Info  Code Status, Allergies, Psychotropic Code Status Info: partial Allergies Info: NKA Psychotropic Info: Seroquel 25mg , Zoloft 25mg          Current Medications (10/19/2016):  This is the current hospital active medication list Current Facility-Administered Medications  Medication Dose Route Frequency Provider Last Rate Last Dose  . acetaminophen (TYLENOL) tablet 650 mg  650 mg Oral Q4H PRN Carlyle Dolly, MD       Or  . acetaminophen (TYLENOL) solution 650 mg  650 mg Per Tube Q4H PRN Carlyle Dolly, MD       Or  . acetaminophen (TYLENOL) suppository 650 mg  650 mg Rectal Q4H PRN Carlyle Dolly, MD      . amiodarone (PACERONE) tablet 200 mg  200 mg Oral Daily Carlyle Dolly, MD      . apixaban Arne [place]) tablet 2.5 mg  2.5 mg Oral BID Carlyle Dolly, MD   2.5 mg at 10/19/16 0320  . Chlorhexidine Gluconate Cloth 2 % PADS 6 each  6 each Topical Q0600 McDiarmid, Blane Ohara, MD      . gabapentin (NEURONTIN) capsule 200 mg  200 mg Oral QHS Carlyle Dolly, MD   200 mg at 10/19/16 0320  . lactose free nutrition (BOOST PLUS) liquid 237 mL  237 mL Oral Daily McDiarmid, Blane Ohara, MD      . mupirocin ointment (BACTROBAN) 2 % 1 application   1 application Nasal BID McDiarmid, Blane Ohara, MD      . MUSCLE RUB CREA 1 application  1 application Topical QID McDiarmid, Blane Ohara, MD      . QUEtiapine (SEROQUEL) tablet 25 mg  25 mg Oral Daily Carlyle Dolly, MD      . senna-docusate (Senokot-S) tablet 1 tablet  1 tablet Oral QHS PRN Carlyle Dolly, MD      . sertraline (ZOLOFT) tablet 25 mg  25 mg Oral Daily Carlyle Dolly, MD         Discharge Medications: Please see discharge summary for a list of discharge medications.  Relevant Imaging Results:  Relevant Lab Results:   Additional Information SS#: [id number]  Geralynn Ochs, LCSW

## 2016-10-19 NOTE — Evaluation (Addendum)
Occupational Therapy Evaluation Patient Details Name: Virginia Meyer MRN: [id number] DOB: August 12, 1928 Today's Date: 10/19/2016    History of Present Illness 83 y.o. female presenting with episode of decreased responsiveness and New right facial droop . 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 fib on Xarelto, PVD, protein calorie malnutrition, hearing impairment MRI complete on 8/28 and showed no acute intracranial process and enhancing Left frontal calvarial lesion.   Clinical Impression   PTA, pt was living in an ALF and was receiving assistance with ADLs including bathing and dressing. Pt currently requiring Mod A for UB ADLs and Max A +2 for LB ADLs due to generalized weakness, decreased functional use of LUE, and poor balance. Pt would benefit from further acute OT to facilitate safe dc and optimize occupational performance. Recommend dc to SNF for further OT to increase safety and independence with ADL and functional mobility.     Follow Up Recommendations  SNF;Supervision/Assistance - 24 hour    Equipment Recommendations  Other (comment) (Defer to next venue)    Recommendations for Other Services PT consult     Precautions / Restrictions Precautions Precautions: Fall Restrictions Weight Bearing Restrictions: No      Mobility Bed Mobility Overal bed mobility: Needs Assistance Bed Mobility: Supine to Sit;Sit to Supine     Supine to sit: Mod assist Sit to supine: Max assist;+2 for physical assistance   General bed mobility comments: Mod A to assist with trunk elevation. Max A +2 to transition back into bed; first person for trunk and second for BLE  Transfers Overall transfer level: Needs assistance Equipment used: Rolling walker (2 wheeled) Transfers: Sit to/from Stand Sit to Stand: Max assist;+2 physical assistance         General transfer comment: Pt requiring Max A +2 to  power into standing and then maintain balance    Balance Overall balance assessment: Needs assistance Sitting-balance support: Bilateral upper extremity supported;Feet supported Sitting balance-Leahy Scale: Poor Sitting balance - Comments: Lateral lean and required Min A to correct Postural control: Left lateral lean;Posterior lean Standing balance support: During functional activity;No upper extremity supported Standing balance-Leahy Scale: Zero Standing balance comment: Max A to maintain sitting during peri care                           ADL either performed or assessed with clinical judgement   ADL Overall ADL's : Needs assistance/impaired Eating/Feeding: Set up;Sitting   Grooming: Minimal assistance;Sitting Grooming Details (indicate cue type and reason): Pt with lateral lean to L when seated at EOB. Required Min A to correct Upper Body Bathing: Moderate assistance;Sitting   Lower Body Bathing: Maximal assistance;+2 for physical assistance;Sit to/from stand   Upper Body Dressing : Moderate assistance;Sitting Upper Body Dressing Details (indicate cue type and reason): donned new gown with Mod A Lower Body Dressing: Maximal assistance;+2 for physical assistance;Sit to/from stand   Toilet Transfer: Maximal assistance;+2 for physical assistance           Functional mobility during ADLs: Maximal assistance;+2 for physical assistance;Rolling walker (sit<>Stand) General ADL Comments: Pt requiring Max A +2 for LB ADLs and functional transfers due to overall weakness.  Pt reporting "tingling" on left side of face. Pt present with some L sided facial droop.      Vision Baseline Vision/History: Wears glasses Wears Glasses: At all times;Reading only Patient Visual Report: Other (comment) (Pt  denying changes in vision) Additional Comments: Further assess at later session     Perception     Praxis      Pertinent Vitals/Pain Pain Assessment: No/denies pain      Hand Dominance Right   Extremity/Trunk Assessment Upper Extremity Assessment Upper Extremity Assessment: Generalized weakness;LUE deficits/detail LUE Deficits / Details: L side weaker than R side. Pt able to perform gross motor movements. Pt with decreased motor control and grasp strength. L hand would drop from bed or RW when sitting/standing.    Lower Extremity Assessment Lower Extremity Assessment: Defer to PT evaluation   Cervical / Trunk Assessment Cervical / Trunk Assessment: Kyphotic   Communication Communication Communication: HOH   Cognition Arousal/Alertness: Awake/alert Behavior During Therapy: WFL for tasks assessed/performed;Flat affect Overall Cognitive Status: No family/caregiver present to determine baseline cognitive functioning                                     General Comments  Pt with stratch on RLE that was bleeding. RN student aware    Exercises     Shoulder Instructions      Home Living Family/patient expects to be discharged to:: Assisted living                             Home Equipment: Shower seat - built in;Hand held shower head;Grab bars - tub/shower;Grab bars - toilet;Walker - 2 wheels;Walker - 4 wheels   Additional Comments: Pt had caregivers to assist with ADLs that come in each day from 8:30-11:30      Prior Functioning/Environment Level of Independence: Needs assistance  Gait / Transfers Assistance Needed: RW at night; w/c down to dinning room ADL's / Homemaking Assistance Needed: Assistance for ADLs, dressing, bathing   Comments: Unclear on accuracy of home set up and PLOF - pt providing different information        OT Problem List:        OT Treatment/Interventions: Self-care/ADL training;Therapeutic exercise;Energy conservation;DME and/or AE instruction;Therapeutic activities;Patient/family education    OT Goals(Current goals can be found in the care plan section) Acute Rehab OT  Goals Patient Stated Goal: Walk again OT Goal Formulation: With patient Time For Goal Achievement: 11/02/16 Potential to Achieve Goals: Good ADL Goals Pt Will Perform Upper Body Dressing: with min guard assist;sitting Pt Will Perform Lower Body Dressing: with mod assist;sit to/from stand Pt Will Transfer to Toilet: with mod assist;stand pivot transfer;bedside commode Pt Will Perform Toileting - Clothing Manipulation and hygiene: with mod assist;sit to/from stand  OT Frequency: Min 2X/week   Barriers to D/C:            Co-evaluation              AM-PAC PT "6 Clicks" Daily Activity     Outcome Measure Help from another person eating meals?: None Help from another person taking care of personal grooming?: A Little Help from another person toileting, which includes using toliet, bedpan, or urinal?: A Lot Help from another person bathing (including washing, rinsing, drying)?: A Lot Help from another person to put on and taking off regular upper body clothing?: A Lot Help from another person to put on and taking off regular lower body clothing?: A Lot 6 Click Score: 15   End of Session Equipment Utilized During Treatment: Gait belt;Rolling walker Nurse Communication: Mobility status  Activity Tolerance: Patient tolerated  treatment well Patient left: in bed;with call bell/phone within reach;with nursing/sitter in room  OT Visit Diagnosis: Unsteadiness on feet (R26.81);Other abnormalities of gait and mobility (R26.89);Muscle weakness (generalized) (M62.81);Hemiplegia and hemiparesis Hemiplegia - Right/Left: Left Hemiplegia - dominant/non-dominant: Non-Dominant                Time: 1027-2536 OT Time Calculation (min): 23 min Charges:    OT General Charges $OT Visit: 1 Visit OT Evaluation $OT Eval Moderate Complexity: 1 Mod OT Treatments $Self Care/Home Management : 8-22 mins  G-Codes:     [place] Hanlon MSOT, OTR/L Acute Rehab Pager: [phone] Office:  [place] 10/19/2016, 11:20 AM

## 2016-10-19 NOTE — Progress Notes (Signed)
EEG completed; results pending.    

## 2016-10-19 NOTE — Clinical Social Work Note (Signed)
Clinical Social Work Assessment  Patient Details  Name: Virginia Meyer MRN: [id number] Date of Birth: 1928/10/01  Date of referral:  10/19/16               Reason for consult:  Facility Placement, Discharge Planning                Permission sought to share information with:  Facility Sport and exercise psychologist, Family Supports Permission granted to share information::  Yes, Verbal Permission Granted  Name::     Virginia Meyer  Agency::  [employer]  Relationship::  Daughter, Son  Sport and exercise psychologist Information:     Housing/Transportation Living arrangements for the past 2 months:  [place] of Information:  Adult Children Patient Interpreter Needed:  None Criminal Activity/Legal Involvement Pertinent to Current Situation/Hospitalization:  No - Comment as needed Significant Relationships:  Adult Children Lives with:  Self, Facility Resident Do you feel safe going back to the place where you live?  Yes Need for family participation in patient care:  Yes (Comment) (patient not fully oriented)  Care giving concerns:  Patient has been living at [facility] and family has no concerns about care received.   Social Worker assessment / plan:  CSW spoke with patient's daughter, Virginia Meyer, over phone to discuss discharge planning. CSW confirmed with patient's daughter that patient has been living at [facility] and will return when medically ready. Patient's daughter indicated that family will provide transport. Patient's daughter had questions about medical information; CSW will alert RN to contact patient's daughter to discuss test results.  Employment status:  Retired Nurse, adult PT Recommendations:  Not assessed at this time Information / Referral to community resources:     Patient/Family's Response to care:  Patient's daughter agreeable for patient to return to ALF.  Patient/Family's Understanding of and Emotional Response to Diagnosis,  Current Treatment, and Prognosis:  Patient's daughter has concerns about the reason for patient's hypertension and other test results. Patient's daughter appreciative of CSW assistance in coordinating patient's return to ALF.  Emotional Assessment Appearance:  Appears stated age Attitude/Demeanor/Rapport:  Unable to Assess Affect (typically observed):  Unable to Assess Orientation:  Oriented to Self, Oriented to Place, Oriented to Situation Alcohol / Substance use:  Not Applicable Psych involvement (Current and /or in the community):  No (Comment)  Discharge Needs  Concerns to be addressed:  Care Coordination Readmission within the last 30 days:  No Current discharge risk:  Physical Impairment Barriers to Discharge:  Continued Medical Work up   [employer], [name] 10/19/2016, 10:57 AM

## 2016-10-19 NOTE — Progress Notes (Signed)
Initial Nutrition Assessment  DOCUMENTATION CODES:   Not applicable  INTERVENTION:   -Boost Plus daily  -Order afternoon snack   NUTRITION DIAGNOSIS:   Increased nutrient needs related to acute illness as evidenced by estimated needs.  GOAL:   Patient will meet greater than or equal to 90% of their needs  MONITOR:   PO intake, Supplement acceptance, Labs, Weight trends  REASON FOR ASSESSMENT:   Consult Assessment of nutrition requirement/status  ASSESSMENT:   83 yo female admitted with syncope, AKI. Pt with skull lesions. Pt with hx of CVA with left hemiparesis (wheelchair bound), PVD, HTN, HLD  Pt ate cereal and drank juice at breakfast this AM. Pt did not like the rest of the items on her breakfast tray (eggs, potatoes). Pt reports she eats 3 meals per day at SNF; reports she typically eats 75% of each meal. Pt also eats an afternoon snack daily at 3pm (yogurt and fruit, popcorn, etc). Pt keeps Boost in her personal refrigerator and drinks on occasion but not daily.   Pt indicates that weight has been stable. However, per weight encounters, 3.3% wt loss in 5 months (not significant for time frame)  Nutrition-Focused physical exam completed. Findings are mild fat depletion, mild/moderate muscle depletion, and no edema. Some wasting can be attributed to pt wheelchair bound status  Pt reports she exercises 3 times daily (mostly chair exercises) and also goes to PT weekly where they exercise her left leg. Pt is mostly wheel chair bound; utilizes left arm but is much weeker than her right.   Labs: reviewed Meds: reviewed  Diet Order:  Diet Heart Room service appropriate? Yes; Fluid consistency: Thin  Skin:  Reviewed, no issues  Last BM:  no documented BM  Height:   Ht Readings from Last 1 Encounters:  10/19/16 5' (1.524 m)    Weight:   Wt Readings from Last 1 Encounters:  10/19/16 117 lb 4.6 oz (53.2 kg)    Ideal Body Weight:     BMI:  Body mass index is  22.91 kg/m.  Estimated Nutritional Needs:   Kcal:  2353-6144 kcals  Protein:  65-73 g  Fluid:  >/= 1.3 L  EDUCATION NEEDS:   No education needs identified at this time   [place], [place], LDN [phone] Pager  [phone] Weekend/On-Call Pager

## 2016-10-19 NOTE — Progress Notes (Signed)
Family Medicine Teach Service Note Daily Progress Note Intern Pager: [phone]  Patient name: Virginia Meyer Medical record number: [id number] Date of birth: 1928/09/22 Age: 83 y.o. Gender: female  Primary Care Provider: Reymundo Poll, MD Consultants: Neurology, cardiology, PT/OT Code Status: Partial  Pt Overview and Major Events to Date:  8/27: Admit to FPTS for TIA vs syncope work up  Assessment and Plan:  Virginia Meyer is a 83 y.o. female presenting with episode of decreased responsiveness and ? New right facial droop . 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 fib on Xarelto, PVD, protein calorie malnutrition, hearing impairment  Syncopal episode: Episode of decreased responsiveness and ?R sided facial droop on 8/27 at ALF but resolved upon presentation to [facility] ED. Leading differentials still includes syncope (cardiologic vs neurogenic cause) vs TIA. Does have hx of complete heart block, aortic stenosis, L carotid stenosis. Orthostatic vitals are positive for orthostasis. No signs of acute CVA, troponin 0 x 2. No signs of infection with normal UA and CXR - Vital signs per floor protocol - Telemetry - Neurology consulted, appreciate recs - A1c, Lipid panel pending - Echo ordered - Neuro checks q2 - PT, OT consulted, appreciate assistance - cardiology consulted, appreciate assistance  Hypertension: Was hypotensive this morning. Also orthostatic vitals positive.  -Hold home cozaar and hydralazine  Hx of complete heart block and Paroxysmal A. Fib: Currently normal sinus rhythm, rate controlled. Admission EKG showing sinus rhythm and reading as incomplete left bundle branch block and anterior Q waves, no acute ST changes. EKG this AM showing NSR and no heart block. Home medications include amiodarone and Eliquis. Hx of heart block possibly playing a role into possible syncopal episode that happened  at ALF. -Continue amiodarone and Eliquis -Telemetry as above and troponins -Cardiology consulted, appreciate their assistance  HFpEF, G1DD: March 2018 Echo: EF65-70%, G1DD. Normal RV size and systolic function. Moderate to severe left atrial enlargement. Mild aortic stenosis. - Hold cozaar -Repeat echo today   Hx of R CVA: Residual left sided facial droop and decreased strength in LUE and LLE.  -Consider starting statin, discuss with patient and daughter  Protein calorie malnutrition: On patient's problem list -Nutrition consult  Hyperlipidemia: Not on statin at home, unsure why. Patient states she "didn't like how it made me feel" when on it in the past. Lipid panel showing Cholesterol 281, LDL 218, and HDL39.  - Consider adding statin as above  Mild AKI: Resolved - Daily BMP - oral rehydration   Lesion on [place] MRI: Lesion left frontal calvarium, ~ 2cm. ?malignancy. - SPEP and UPEP for ruling out multiple myeloma - Consider outpatient bone or pet scan per family preference to rule out malignancy  Urinary incontinence: Wears adult diaper at home - Continue adult diaper and consider female condom cath while here  Wheelchair bound: Unable to safely ambulate at ALF. Uses wheelchair - PT/OT  Hx of Anxiety and MDD:  - Continue home Zoloft and Seroquel  Hx of OA and neuropathy - Continue home tramadol and gabapentin  FEN/GI: Saline lock IV, heart healthy diet PPx: Home eliquis  Disposition: Likely back to ALF after medically stable  Subjective:  Virginia Meyer is doing well this morning without new complaints or acute events last night. She was asked if her previous symptoms of heart block a few months ago felt similar to the recent episode but she denied this. She attempted to eat some  of the breakfast but did not eat much. She has not used the bathroom since last night.  Objective: Temp:  [97.1 F (36.2 C)-98.4 F (36.9 C)] 98.3 F (36.8 C) (08/28 0546) Pulse  Rate:  [55-84] 64 (08/28 0546) Resp:  [11-21] 20 (08/28 0546) BP: (98-233)/(39-86) 110/47 (08/28 0546) SpO2:  [85 %-100 %] 94 % (08/28 0546) Weight:  [117 lb 4.6 oz (53.2 kg)] 117 lb 4.6 oz (53.2 kg) (08/28 0146)  Physical Exam: General: Pleasant elderly female in no acute distress Cardiovascular: Regular rate and rhythm, normal V4/Q5, 3/6 systolic murmur, without rubs or gallops Respiratory: Clear to auscultation bilaterally with normal work of breathing Abdomen: Soft, nondistended, slightt tenderness in RLQ, normal bowel sounds Neurologic exam Mental status:alert, oriented to person, place, year Cranial Nerves: Residual left sided facial droop with asymetric smile of the left side, rest of exam is normal Motor: 5/5 RUE, 4/5 RLE, 4/5 LUE, 3/5 LLE Sensory: Grossly intact throughout  Laboratory:  Recent Labs Lab 10/18/16 1403 10/18/16 1413 10/19/16 0708  WBC 6.4  --  6.4  HGB 12.6 13.6 12.6  HCT 39.4 40.0 38.7  PLT 146*  --  166    Recent Labs Lab 10/18/16 1403 10/18/16 1413 10/19/16 0708  NA 139 140 140  K 4.3 4.3 3.8  CL 106 104 106  CO2 23  --  25  BUN 22* 27* 21*  CREATININE 1.23* 1.10* 1.00  CALCIUM 9.4  --  9.5  PROT 6.4*  --   --   BILITOT 0.5  --   --   ALKPHOS 95  --   --   ALT 25  --   --   AST 34  --   --   GLUCOSE 113* 117* 91    Imaging/Diagnostic Tests: Ct Angio Head W Or Wo Contrast  Result Date: 10/18/2016 CLINICAL DATA:  Follow-up LEFT facial droop. History of hypertension, hyperlipidemia and stroke. EXAM: CT ANGIOGRAPHY HEAD AND NECK TECHNIQUE: Multidetector CT imaging of the head and neck was performed using the standard protocol during bolus administration of intravenous contrast. Multiplanar CT image reconstructions and MIPs were obtained to evaluate the vascular anatomy. Carotid stenosis measurements (when applicable) are obtained utilizing NASCET criteria, using the distal internal carotid diameter as the denominator. CONTRAST:  50 cc Isovue  370 COMPARISON:  CT HEAD October 18, 2016 at 1411 hours FINDINGS: CTA NECK AORTIC ARCH: 3 vessel aortic arch. Moderate calcific atherosclerosis of aortic arch and arch vessel origins. Moderate stenosis LEFT Common carotid and LEFT subclavian artery origins. RIGHT Common carotid artery origin is widely patent. Mild tandem stenoses RIGHT subclavian artery due to atherosclerosis. RIGHT CAROTID SYSTEM: Common carotid artery is widely patent, mild calcific atherosclerosis. Calcific atherosclerosis resulting in short segment 50% stenosis RIGHT internal carotid artery origin. Internal carotid artery is patent with mild distal calcific atherosclerosis. LEFT CAROTID SYSTEM: Common carotid artery is widely patent. Calcific atherosclerosis resulting in short segment of 60% stenosis LEFT internal carotid artery origin. Normal appearance of the included internal carotid artery. VERTEBRAL ARTERIES:RIGHT vertebral artery is dominant with tandem mild stenosis RIGHT P1 segment associated with calcific atherosclerosis. Moderate luminal irregularity RIGHT vertebral artery. RIGHT middle cerebral Diminutive LEFT vertebral artery. SKELETON: No acute osseous process though bone windows have not been submitted. Multilevel severe degenerative change of the cervical spine. OTHER NECK: Soft tissues of the neck are non-acute though, not tailored for evaluation. Subcentimeter hypodensity RIGHT thyroid lobe with scattered small calcifications, no indicated follow-up. CTA HEAD ANTERIOR CIRCULATION: Atherosclerosis resulting  in moderate stenosis bilateral supraclinoid internal carotid artery's. Patent anterior communicating artery. Moderate tandem stenoses RIGHT mid to distal anterior cerebral artery severe stenosis mid RIGHT A2 segment. Severe stenosis RIGHT proximal M2 superior division and inferior division. Moderate tandem stenoses bilateral mid to distal MCA segments. No large vessel occlusion, contrast extravasation or aneurysm. POSTERIOR  CIRCULATION: Calcific atherosclerosis resulting in moderate stenoses RIGHT vertebral artery. Severely stenotic distal LEFT V4 segment. Diminutive basilar artery. Robust bilateral posterior communicating arteries with tandem severe stenoses bilateral posterior cerebral arteries. No large vessel occlusion, contrast extravasation or aneurysm. VENOUS SINUSES: Major dural venous sinuses are patent though not tailored for evaluation on this angiographic examination. ANATOMIC VARIANTS: Complete circle of Willis. DELAYED PHASE: No abnormal intracranial enhancement. MIP images reviewed. IMPRESSION: CTA NECK: 1. Atherosclerosis resulting in short segments 50% RIGHT and 60% LEFT stenosis internal carotid artery origins. 2. Moderate luminal irregularity RIGHT vertebral artery seen with atherosclerosis and old dissection without flow limiting stenosis. CTA head: 1. No emergent large vessel occlusion. 2. Moderate intracranial atherosclerosis with superimposed scattered severe stenoses of anterior and posterior circulation. Electronically Signed   By: Elon Alas M.D.   On: 10/18/2016 17:01   Dg Chest 2 View  Result Date: 10/19/2016 CLINICAL DATA:  Decreased responsiveness. EXAM: CHEST  2 VIEW COMPARISON:  CT 10/18/2016.  05/07/2016. FINDINGS: Mediastinum and hilar structures are normal. Heart size normal. Stable mild prominence of the great vessels. Heart size normal. Lungs are clear. No pleural effusion or pneumothorax. Diffuse osteopenia. Degenerative changes and scoliosis thoracic spine. Degenerative changes both shoulders. Right shoulder is high-riding consistent rotator cuff tear. IMPRESSION: No acute cardiopulmonary disease. Electronically Signed   By: [place]   On: 10/19/2016 06:37   Ct Angio Neck W Or Wo Contrast  Result Date: 10/18/2016 CLINICAL DATA:  Follow-up LEFT facial droop. History of hypertension, hyperlipidemia and stroke. EXAM: CT ANGIOGRAPHY HEAD AND NECK TECHNIQUE: Multidetector CT  imaging of the head and neck was performed using the standard protocol during bolus administration of intravenous contrast. Multiplanar CT image reconstructions and MIPs were obtained to evaluate the vascular anatomy. Carotid stenosis measurements (when applicable) are obtained utilizing NASCET criteria, using the distal internal carotid diameter as the denominator. CONTRAST:  50 cc Isovue 370 COMPARISON:  CT HEAD October 18, 2016 at 1411 hours FINDINGS: CTA NECK AORTIC ARCH: 3 vessel aortic arch. Moderate calcific atherosclerosis of aortic arch and arch vessel origins. Moderate stenosis LEFT Common carotid and LEFT subclavian artery origins. RIGHT Common carotid artery origin is widely patent. Mild tandem stenoses RIGHT subclavian artery due to atherosclerosis. RIGHT CAROTID SYSTEM: Common carotid artery is widely patent, mild calcific atherosclerosis. Calcific atherosclerosis resulting in short segment 50% stenosis RIGHT internal carotid artery origin. Internal carotid artery is patent with mild distal calcific atherosclerosis. LEFT CAROTID SYSTEM: Common carotid artery is widely patent. Calcific atherosclerosis resulting in short segment of 60% stenosis LEFT internal carotid artery origin. Normal appearance of the included internal carotid artery. VERTEBRAL ARTERIES:RIGHT vertebral artery is dominant with tandem mild stenosis RIGHT P1 segment associated with calcific atherosclerosis. Moderate luminal irregularity RIGHT vertebral artery. RIGHT middle cerebral Diminutive LEFT vertebral artery. SKELETON: No acute osseous process though bone windows have not been submitted. Multilevel severe degenerative change of the cervical spine. OTHER NECK: Soft tissues of the neck are non-acute though, not tailored for evaluation. Subcentimeter hypodensity RIGHT thyroid lobe with scattered small calcifications, no indicated follow-up. CTA HEAD ANTERIOR CIRCULATION: Atherosclerosis resulting in moderate stenosis bilateral  supraclinoid internal carotid artery's. Patent anterior  communicating artery. Moderate tandem stenoses RIGHT mid to distal anterior cerebral artery severe stenosis mid RIGHT A2 segment. Severe stenosis RIGHT proximal M2 superior division and inferior division. Moderate tandem stenoses bilateral mid to distal MCA segments. No large vessel occlusion, contrast extravasation or aneurysm. POSTERIOR CIRCULATION: Calcific atherosclerosis resulting in moderate stenoses RIGHT vertebral artery. Severely stenotic distal LEFT V4 segment. Diminutive basilar artery. Robust bilateral posterior communicating arteries with tandem severe stenoses bilateral posterior cerebral arteries. No large vessel occlusion, contrast extravasation or aneurysm. VENOUS SINUSES: Major dural venous sinuses are patent though not tailored for evaluation on this angiographic examination. ANATOMIC VARIANTS: Complete circle of Willis. DELAYED PHASE: No abnormal intracranial enhancement. MIP images reviewed. IMPRESSION: CTA NECK: 1. Atherosclerosis resulting in short segments 50% RIGHT and 60% LEFT stenosis internal carotid artery origins. 2. Moderate luminal irregularity RIGHT vertebral artery seen with atherosclerosis and old dissection without flow limiting stenosis. CTA head: 1. No emergent large vessel occlusion. 2. Moderate intracranial atherosclerosis with superimposed scattered severe stenoses of anterior and posterior circulation. Electronically Signed   By: Elon Alas M.D.   On: 10/18/2016 17:01   Mr Delrae Hagey OJ Contrast  Result Date: 10/18/2016 CLINICAL DATA:  Follow-up stroke. EXAM: MRI HEAD WITHOUT AND WITH CONTRAST TECHNIQUE: Multiplanar, multiecho pulse sequences of the Fromme and surrounding structures were obtained without and with intravenous contrast. CONTRAST:  10 cc MULTIHANCE GADOBENATE DIMEGLUMINE 529 MG/ML IV SOLN COMPARISON:  CT HEAD October 18, 2016 at 1411 hours and CT angiogram head and neck October 18, 2016 at 1622 hours  FINDINGS: Multiple sequences are moderately motion degraded. Severely motion degraded post gadolinium coronal T1. INTRACRANIAL CONTENTS: No reduced diffusion to suggest acute ischemia. RIGHT frontotemporal parietal encephalomalacia with faint susceptibility artifact. No susceptibility artifact is suggests acute blood products appear chronic microhemorrhage LEFT frontal lobe. Ex vacuo dilatation RIGHT lateral ventricle. No hydrocephalus. Old small bilateral cerebellar infarcts. Faint T2 hyperintensities within knee at home I associated with chronic small vessel ischemic disease. Confluent supratentorial and patchy pontine white matter FLAIR T2 hyperintensities in part reflecting wallerian degeneration. No midline shift, mass effect or masses. No abnormal intraparenchymal or extra-axial enhancement. No extra-axial fluid collections. VASCULAR: Normal major intracranial vascular flow voids present at skull base. SKULL AND UPPER CERVICAL SPINE: No abnormal sellar expansion. Faint reduced diffusion, heterogeneous ADC values, low T1 and enhancing LEFT frontal calvarial 2.1 cm lesion. Heterogeneous calvarial bone marrow signal. Craniocervical junction maintained. SINUSES/ORBITS: The mastoid air-cells and included paranasal sinuses are well-aerated.The included ocular globes and orbital contents are non-suspicious. OTHER: None. IMPRESSION: 1. No acute intracranial process on this motion degraded examination. 2. Old Large RIGHT MCA territory infarct. Old small cerebellar infarcts. 3. Moderate to severe chronic small vessel ischemic disease. 4. Enhancing LEFT frontal calvarial 2.1 cm lesion. Heterogeneous calvarial bone marrow signal. These are nonspecific, metastatic disease not excluded. Bone scan could be performed as clinically indicated though, may not be scintigraphically demonstrated. Electronically Signed   By: Elon Alas M.D.   On: 10/18/2016 22:13   Ct Head Code Stroke Wo Contrast  Result Date:  10/18/2016 CLINICAL DATA:  Code stroke. Sudden onset of right-sided facial droop. Personal history of stroke. EXAM: CT HEAD WITHOUT CONTRAST TECHNIQUE: Contiguous axial images were obtained from the base of the skull through the vertex without intravenous contrast. COMPARISON:  CT head 05/17/2016.  He FINDINGS: Roop: A large remote right MCA territory infarct is stable. Atrophy and diffuse white matter changes are noted bilaterally. Knees are similar the prior exam. No acute cortical  infarct, hemorrhage, or mass lesion is present. Remote ischemic changes involving the internal capsule and bowel my bilaterally are stable. Diffuse white matter changes extending into the brainstem are similar the prior study. Remote cerebellar infarcts are stable. The ventricles are proportionate to the degree of atrophy with ex vacuo dilation on the right. No significant extra-axial fluid collection is present. Vascular: Atherosclerotic calcifications are present within the cavernous internal carotid artery is bilaterally. Calcifications are present at the dural margin of the right vertebral artery. There is no hyperdense vessel. Skull: Multiple lucent lesions are present throughout the calvarium. These were present on previous studies. This raises concern for metastatic disease or multiple myeloma. Hemangiomas are considered less likely. Sinuses/Orbits: The paranasal sinuses and mastoid air cells are clear. Bilateral lens replacements are present. The globes and orbits are otherwise within normal limits. ASPECTS [facility] Stroke Program Early CT Score) - Ganglionic level infarction (caudate, lentiform nuclei, internal capsule, insula, M1-M3 cortex): 7/7 - Supraganglionic infarction (M4-M6 cortex): 3/3 Total score (0-10 with 10 being normal): 10/10 IMPRESSION: 1. Large remote right MCA territory nonhemorrhagic infarct is stable. 2. Remote ischemic changes involving the internal capsule, thalami, brainstem, and cerebellum are stable.  3. No acute intracranial abnormality. 4. Atherosclerosis. 5. Lucent lesions throughout the calvarium raising concern for metastatic disease or myeloma. 6. ASPECTS is 10/10 These results were called by telephone at the time of interpretation on 10/18/2016 at 2:29 pm to Dr. Leonel Ramsay, who verbally acknowledged these results. Electronically Signed   By: San Morelle M.D.   On: 10/18/2016 14:30    Ophelia Charter, MS4   RESIDENT ADDENDUM  I saw and evaluated the patient, performing the key elements of service. I developed the management plan that is described in the Medical Student's note, and I agree with the content, making changing as needed. My detailed findings are below.  Physical Exam:  BP (!) 96/41 (BP Location: Left Arm)   Pulse 72   Temp (!) 97.3 F (36.3 C) (Oral)   Resp 20   Ht 5' (1.524 m)   Wt 117 lb 4.6 oz (53.2 kg)   SpO2 95%   BMI 22.91 kg/m   General Appearance:   Elderly frail female, in no acute distress, sitting up in bed  HENT: Normocephalic, no obvious abnormality, PERRL, EOM's intact, conjunctiva and cornea normal, external ear canals normal, both ears, nares patent and symmetric  Neck:   Normal range of motion without masses or tenderness  Lungs:   Clear to auscultation bilaterally, respirations unlabored, nor rales, rhonchi or wheezes  Heart:   Regular rate and rhythm, S1 and S2 normal, 3/6 systolic murmur, Peripheral pulses present and normal throughout; Brisk capillary refill.  Abdomen:   Soft, non-tender, bowel sounds present, no mass, or organomegaly  Musculoskeletal:  Grossly normal age-appropriate movements, tone, and strength  Skin/Hair/Nails:   Skin warm, dry and intact, no bruises or petechiae  Neurologic:   Alert and oriented x 3, continued asymetric smile with left sided weakness. 5/5 in RUE, 4/5 RLE, 4/5 LUE,and 3/5 LLE    Smitty Cords, MD 10/19/2016, 11:07 AM PGY-3, [place]

## 2016-10-19 NOTE — Care Management Note (Signed)
Case Management Note  Patient Details  Name: Virginia Meyer MRN: [id number] Date of Birth: 08-Apr-1928  Subjective/Objective:    Pt in with decreased responsiveness. She is from ALF.                 Action/Plan: Awaiting PT/OT recommendations. Plan is for pt to return to ALF when medically ready. CM following.   Expected Discharge Date:                  Expected Discharge Plan:  Assisted Living / Rest Home  In-House Referral:  Clinical Social Work  Discharge planning Services     Post Acute Care Choice:    Choice offered to:     DME Arranged:    DME Agency:     HH Arranged:    [place] Agency:     Status of Service:  In process, will continue to follow  If discussed at Long Length of Stay Meetings, dates discussed:    Additional Comments:  Pollie Friar, RN 10/19/2016, 10:32 AM

## 2016-10-19 NOTE — Discharge Instructions (Signed)

## 2016-10-19 NOTE — Procedures (Signed)
ELECTROENCEPHALOGRAM REPORT  Date of Study: 10/19/2016  Patient's Name: Virginia Meyer MRN: [id number] Date of Birth: [birth year]  Referring Provider: Roland Rack, MD  Clinical History: 83 year old woman with history of prior right MCA stroke presents with episode of unresponsiveness.  Medications: acetaminophen (TYLENOL) solution 650 mg  amiodarone (PACERONE) tablet 200 mg  apixaban (ELIQUIS) tablet 2.5 mg  gabapentin (NEURONTIN) QUEtiapine (SEROQUEL) tablet 25 mg  senna-docusate (Senokot-S) tablet 1 tablet  sertraline (ZOLOFT) tablet 25 mg  traMADol (ULTRAM) tablet 25 mg   Technical Summary: A multichannel digital EEG recording measured by the international 10-20 system with electrodes applied with paste and impedances below 5000 ohms performed as portable with EKG monitoring in an awake and asleep patient.  Hyperventilation and photic stimulation were not performed.  The digital EEG was referentially recorded, reformatted, and digitally filtered in a variety of bipolar and referential montages for optimal display.   Description: The patient is drowsy and asleep during the recording.  There is diffuse mild background slowing and superimposed right hemispheric 2-3 Hz delta slowing.  During maximal wakefulness, there is a asymmetric, medium voltage 8-9 Hz posterior dominant rhythm of the left hemisphere, 4-5 Hz of the right hemisphere . Vertex waves and symmetric sleep spindles were seen.  There were no epileptiform discharges or electrographic seizures seen.    EKG lead was unremarkable.  Impression: This awake and asleep EEG is abnormal due to: 1. diffuse slowing of the waking background. 2.  Right hemispheric slowing  Clinical Correlation of the above findings indicates; 1.  diffuse cerebral dysfunction that is non-specific in etiology, which may be due to excessive drowsiness.  However, it can also be seen with hypoxic/ischemic injury, toxic/metabolic encephalopathies,  neurodegenerative disorders, or medication effect.    2.  Focal structural or physiologic abnormality in the right hemisphere  The absence of epileptiform discharges does not rule out a clinical diagnosis of epilepsy.    Clinical correlation is advised.  Metta Clines, DO

## 2016-10-19 NOTE — Progress Notes (Signed)
  Echocardiogram 2D Echocardiogram has been performed.  Virginia Meyer 10/19/2016, 2:29 PM

## 2016-10-19 NOTE — Evaluation (Signed)
Physical Therapy Evaluation Patient Details Name: Virginia Meyer MRN: [id number] DOB: 1928/07/11 Today's Date: 10/19/2016   History of Present Illness  83 y.o. female presenting with episode of decreased responsiveness and New right facial droop . 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 fib on Xarelto, PVD, protein calorie malnutrition, hearing impairment MRI complete on 8/28 and showed no acute intracranial process and enhancing Left frontal calvarial lesion.  Clinical Impression  Orders received for PT evaluation. Patient demonstrates deficits in functional mobility as indicated below. Will benefit from continued skilled PT to address deficits and maximize function. Will see as indicated and progress as tolerated.  Recommend return to ALF with max assist for all aspects of care at this time.    Follow Up Recommendations Home health PT;Supervision/Assistance - 24 hour (return to ALF with max assist and HHPT)    Equipment Recommendations  None recommended by PT    Recommendations for Other Services       Precautions / Restrictions Precautions Precautions: Fall Restrictions Weight Bearing Restrictions: No      Mobility  Bed Mobility Overal bed mobility: Needs Assistance Bed Mobility: Supine to Sit;Sit to Supine     Supine to sit: Mod assist Sit to supine: Max assist   General bed mobility comments: Moderate assist to elevate trunk and rotate hips to EOB.  Transfers Overall transfer level: Needs assistance Equipment used: 1 person hand held assist (face to face with chuck pad) Transfers: Sit to/from Stand;Stand Pivot Transfers Sit to Stand: Max assist Stand pivot transfers: Max assist       General transfer comment: max assist to power up and transfer to chair (performed x2) states that this is similar to ability prior to admission  Ambulation/Gait                 Stairs            Wheelchair Mobility    Modified Rankin (Stroke Patients Only)       Balance Overall balance assessment: Needs assistance Sitting-balance support: Bilateral upper extremity supported;Feet supported Sitting balance-Leahy Scale: Poor Sitting balance - Comments: Lateral lean and required Min A to correct Postural control: Left lateral lean;Posterior lean Standing balance support: During functional activity;No upper extremity supported Standing balance-Leahy Scale: Zero Standing balance comment: Max assist for upright postioning, unable to fully obtain erect position                             Pertinent Vitals/Pain Pain Assessment: No/denies pain    Home Living Family/patient expects to be discharged to:: Assisted living               Home Equipment: Shower seat - built in;Hand held shower head;Grab bars - tub/shower;Grab bars - toilet;Walker - 2 wheels;Walker - 4 wheels Additional Comments: Pt had caregivers to assist with ADLs that come in each day from 8:30-11:30    Prior Function Level of Independence: Needs assistance   Gait / Transfers Assistance Needed: RW at night; w/c down to dinning room  ADL's / Homemaking Assistance Needed: Assistance for ADLs, dressing, bathing  Comments: patient poor historian, unclear previous assist and function     Hand Dominance   Dominant Hand: Right    Extremity/Trunk Assessment   Upper Extremity Assessment Upper Extremity Assessment: Generalized weakness LUE Deficits / Details: L side weaker than R side. Pt able  to perform gross motor movements. Pt with decreased motor control and grasp strength. L hand would drop from bed or RW when sitting/standing.     Lower Extremity Assessment Lower Extremity Assessment: LLE deficits/detail LLE Deficits / Details: LLE weakness at baseline grossly 3-/5 gross motions LLE Coordination: decreased fine motor;decreased gross motor    Cervical / Trunk  Assessment Cervical / Trunk Assessment: Kyphotic  Communication   Communication: HOH  Cognition Arousal/Alertness: Awake/alert Behavior During Therapy: WFL for tasks assessed/performed;Flat affect Overall Cognitive Status: No family/caregiver present to determine baseline cognitive functioning                                        General Comments      Exercises     Assessment/Plan    PT Assessment Patient needs continued PT services  PT Problem List Decreased strength;Decreased activity tolerance;Decreased balance;Decreased mobility;Decreased cognition;Decreased safety awareness       PT Treatment Interventions DME instruction;Gait training;Functional mobility training;Therapeutic activities;Therapeutic exercise;Balance training;Patient/family education    PT Goals (Current goals can be found in the Care Plan section)  Acute Rehab PT Goals Patient Stated Goal: Walk again PT Goal Formulation: With patient Time For Goal Achievement: 11/02/16 Potential to Achieve Goals: Fair    Frequency Min 2X/week   Barriers to discharge        Co-evaluation               AM-PAC PT "6 Clicks" Daily Activity  Outcome Measure Difficulty turning over in bed (including adjusting bedclothes, sheets and blankets)?: Unable Difficulty moving from lying on back to sitting on the side of the bed? : Unable Difficulty sitting down on and standing up from a chair with arms (e.g., wheelchair, bedside commode, etc,.)?: Unable Help needed moving to and from a bed to chair (including a wheelchair)?: A Little Help needed walking in hospital room?: Total Help needed climbing 3-5 steps with a railing? : Total 6 Click Score: 8    End of Session Equipment Utilized During Treatment: Gait belt Activity Tolerance: Patient tolerated treatment well Patient left: in bed;with call bell/phone within reach;with bed alarm set;Other (comment) (returned to bed for testing) Nurse  Communication: Mobility status PT Visit Diagnosis: Muscle weakness (generalized) (M62.81);Other symptoms and signs involving the nervous system (R29.898)    Time: 5638-7564 PT Time Calculation (min) (ACUTE ONLY): 18 min   Charges:   PT Evaluation $PT Eval Moderate Complexity: 1 Mod     PT G Codes:        Alben Deeds, PT DPT  Board Certified Neurologic Specialist [place] 10/19/2016, 3:33 PM

## 2016-10-19 NOTE — Consult Note (Signed)
Cardiology Consultation:   Patient ID: Virginia Meyer; [id number]; 17-Jun-1928   Admit date: 10/18/2016 Date of Consult: 10/19/2016  Primary Care Provider: Reymundo Poll, MD Primary Cardiologist: Dr. Adora Fridge  Primary Electrophysiologist: NA   Patient Profile:   Virginia Meyer is a 83 y.o. female with a hx of syncope, CVAs, HTN,  PAF on Eliquis who is being seen today for the evaluation of cardiac reason for syncope at the request of Dr. McDiarmid.  History of Present Illness:   Virginia Meyer has a hx of a hx of syncope, CVAs, HTN,  PAF on Eliquis and no hx of CAD.Marland Kitchen  In March pt had  Syncope and HR was 30  CHB and needed IV atropine, externally paced and glucagon and pt returned to SR.  She was on dig, coreg and dilt at that time.  These meds were stopped.  During that stay she developed a flutter and was placed on amiodarone.  Now on 200 mg daily.  No further brady noted on that admit.   EF on echo at 65-70%.  G1 DD.  Mild AS, LA severely dilated. PA pressure 31 mmHg.  ER visit 05/17/16 was due to fall, sitting on commode -leaned too far and struck her head.  No LOC per ER note.   BP was elevated 239/78,   Nov 2017 presented with altered mental status-had been staring off and not responding but was awake, DX with UTI.  Hypotensive on that admit  Pulse was 48  EKG one at 48 SB and one at 46 SB she was on dig, dilt and coreg then.   Yesterday sent by EMS due to rt facial droop while having lunch.  She could hear them calling her name but could not respond.  She was in SR.  She does have 70% stenosis of Lt ICA.  CT head was stable.  MRI of head, no acute intracranial process, old Large Rt MCA territory infarct.  Mod to severe chronic small vessel ischemic disease.    Labs today troponins neg X 2,  BUN 21, Cr 1.0, Na 140, LDL chol  Currently denies any chest pain or SOB.  No lightheadedness or dizziness.  Other wise she had been doing well. CT amgio of neck with 40% LIC stenosis, Rt 98% stenosis.   No problems today.    Past Medical History:  Diagnosis Date  . Anxiety   . Fall   . GERD (gastroesophageal reflux disease)   . Heart failure ([place])   . HLD (hyperlipidemia)   . HTN (hypertension)   . Hyperlipidemia   . Hypotension   . Major depressive disorder   . Neuropathy   . PVD (peripheral vascular disease) ([place])   . Stroke [facility])    left sided deficit  . UTI (urinary tract infection)     Past Surgical History:  Procedure Laterality Date  . ABDOMINAL HYSTERECTOMY    . FOOT NEUROMA SURGERY    . SPINE SURGERY    . TONSILLECTOMY       Home Medications:  Prior to Admission medications   Medication Sig Start Date End Date Taking? Authorizing Provider  acetaminophen (TYLENOL) 500 MG tablet Take 500 mg by mouth 4 (four) times daily. Left knee   Yes [provider]  amiodarone (PACERONE) 200 MG tablet Take 1 tablet by mouth twice daily for 10 days. Starting on 05/20/2016, only take 1 tablet once daily. Patient taking differently: Take 200 mg by mouth daily.  05/10/16  Yes Ahmed Prima, [place]  M, PA-C  apixaban (ELIQUIS) 2.5 MG TABS tablet Take 1 tablet (2.5 mg total) by mouth 2 (two) times daily. 10/16/15  Yes Rosalin Hawking, MD  gabapentin (NEURONTIN) 100 MG capsule Take 100-200 mg by mouth See admin instructions. Take 100 mg by mouth in the morning and take 200 mg by mouth at bedtime 10/02/15  Yes [provider]  hydrALAZINE (APRESOLINE) 10 MG tablet Take 10 mg by mouth 2 (two) times daily.   Yes [provider]  losartan (COZAAR) 100 MG tablet Take 100 mg by mouth daily.   Yes [provider]  Menthol, Topical Analgesic, (BIOFREEZE) 4 % GEL Apply 1 application topically 4 (four) times daily. Left knee for pain    Yes [provider]  QUEtiapine (SEROQUEL) 25 MG tablet Take 25 mg by mouth daily.   Yes [provider]  sertraline (ZOLOFT) 25 MG tablet Take 25 mg by mouth daily.   Yes [provider]  traMADol (ULTRAM) 50 MG  tablet Take 25 mg by mouth every 8 (eight) hours as needed for moderate pain.   Yes [provider]  lisinopril (PRINIVIL,ZESTRIL) 40 MG tablet Take 1 tablet (40 mg total) by mouth daily. Patient not taking: Reported on 08/10/2016 05/12/16   Almyra Deforest, PA    Inpatient Medications: Scheduled Meds: . amiodarone  200 mg Oral Daily  . apixaban  2.5 mg Oral BID  . Chlorhexidine Gluconate Cloth  6 each Topical Q0600  . gabapentin  200 mg Oral QHS  . lactose free nutrition  237 mL Oral Daily  . mupirocin ointment  1 application Nasal BID  . MUSCLE RUB  1 application Topical QID  . QUEtiapine  25 mg Oral Daily  . sertraline  25 mg Oral Daily   Continuous Infusions:  PRN Meds: acetaminophen **OR** acetaminophen (TYLENOL) oral liquid 160 mg/5 mL **OR** acetaminophen, perflutren lipid microspheres (DEFINITY) IV suspension, senna-docusate  Allergies:   No Known Allergies  Social History:   Social History   Social History  . Marital status: Widowed    Spouse name: N/A  . Number of children: 4  . Years of education: 12   Occupational History  . Retired    Social History Main Topics  . Smoking status: Never Smoker  . Smokeless tobacco: Never Used  . Alcohol use No  . Drug use: No  . Sexual activity: Not on file   Other Topics Concern  . Not on file   Social History Narrative   [facility] at [facility]   Caffeine use: 1 cup tea/day   1 cup coffee/day    Family History:    Family History  Problem Relation Age of Onset  . Heart attack Brother   . Stroke Neg Hx   . Neuropathy Neg Hx      ROS:  Please see the history of present illness.  ROS  General:no colds or fevers, no weight changes Skin:no rashes or ulcers HEENT:no blurred vision, no congestion CV:see HPI PUL:see HPI GI:no diarrhea constipation or melena, no indigestion GU:no hematuria, no dysuria MS:no joint pain, no claudication Neuro:? syncope, no lightheadedness see HPI Endo:no diabetes, no thyroid  disease   Physical Exam/Data:   Vitals:   10/19/16 0546 10/19/16 0800 10/19/16 0951 10/19/16 1300  BP: (!) 110/47 (!) 121/51 (!) 96/41 (!) 144/66  Pulse: 64 66 72 70  Resp: 20 (!) 22 20 18   Temp: 98.3 F (36.8 C) 97.9 F (36.6 C) (!) 97.3 F (36.3 C) 97.9 F (  36.6 C)  TempSrc: Axillary Axillary Oral Oral  SpO2: 94% 96% 95% 96%  Weight:      Height:       No intake or output data in the 24 hours ending 10/19/16 1404 Filed Weights   10/18/16 2346 10/19/16 0146  Weight: 117 lb 4.6 oz (53.2 kg) 117 lb 4.6 oz (53.2 kg)   Body mass index is 22.91 kg/m.  General:  Well nourished but frail, female in no acute distress HEENT: normal Lymph: no adenopathy Neck: no JVD Endocrine:  No thryomegaly Vascular: + Lt carotid bruits;2+ pedal pulses bil.  Cardiac:  normal S1, S2; RRR; 5-2/8 systolic murmur no gallup rub or click Lungs:  clear to auscultation bilaterally, no wheezing, rhonchi or rales  Abd: soft, nontender, no hepatomegaly  Ext: no edema Musculoskeletal:  No deformities, BUE and BLE strength normal and equal Skin: warm and dry  Neuro:  Alert and oriented X 3, MAE, follows commands + facial symmetry, along with smile and frown, tongue midline Psych:  Normal affect   EKG:  The EKG was personally reviewed and demonstrates:  SR rate of 65, no acute changes. Telemetry:  Telemetry was personally reviewed and demonstrates:  SR rare PVCs and lowest HR noted of 52 mostly 60s.  Relevant CV Studies: ECHO:  Today  Study Conclusions  - Left ventricle: The cavity size was normal. Wall thickness was   increased in a pattern of moderate LVH. Systolic function was   vigorous. The estimated ejection fraction was in the range of 65%   to 70%. There was dynamic obstruction. Mid cavitary gradient   3.65ms, peak gradient 459mg. Wall motion was normal; there were   no regional wall motion abnormalities. Doppler parameters are   consistent with abnormal left ventricular relaxation (grade  1   diastolic dysfunction). - Aortic valve: Trileaflet; moderately thickened, moderately   calcified leaflets. There was mild stenosis. There was mild   regurgitation. Peak velocity (S): 279 cm/s. Mean gradient (S): 16   mm Hg. Valve area (VTI): 1.29 cm^2. Valve area (Vmax): 1.23 cm^2.   Valve area (Vmean): 1.34 cm^2. - Mitral valve: Moderately calcified annulus. There was trivial   regurgitation. - Left atrium: The atrium was severely dilated. - Pulmonary arteries: Systolic pressure was mildly increased. PA   peak pressure: 33 mm Hg (S).  Impressions:  - Compared to the prior study, there has been no significant   interval change.  Laboratory Data:  Chemistry Recent Labs Lab 10/18/16 1403 10/18/16 1413 10/19/16 0708  NA 139 140 140  K 4.3 4.3 3.8  CL 106 104 106  CO2 23  --  25  GLUCOSE 113* 117* 91  BUN 22* 27* 21*  CREATININE 1.23* 1.10* 1.00  CALCIUM 9.4  --  9.5  GFRNONAA 39*  --  50*  GFRAA 45*  --  58*  ANIONGAP 10  --  9     Recent Labs Lab 10/18/16 1403  PROT 6.4*  ALBUMIN 3.8  AST 34  ALT 25  ALKPHOS 95  BILITOT 0.5   Hematology Recent Labs Lab 10/18/16 1403 10/18/16 1413 10/19/16 0708  WBC 6.4  --  6.4  RBC 4.28  --  4.24  HGB 12.6 13.6 12.6  HCT 39.4 40.0 38.7  MCV 92.1  --  91.3  MCH 29.4  --  29.7  MCHC 32.0  --  32.6  RDW 14.7  --  15.1  PLT 146*  --  166   Cardiac Enzymes Recent  Labs Lab 10/19/16 0004  TROPONINI <0.03    Recent Labs Lab 10/18/16 1411  TROPIPOC 0.00    BNPNo results for input(s): BNP, PROBNP in the last 168 hours.  DDimer No results for input(s): DDIMER in the last 168 hours.  Radiology/Studies:  Ct Angio Head W Or Wo Contrast  Result Date: 10/18/2016 CLINICAL DATA:  Follow-up LEFT facial droop. History of hypertension, hyperlipidemia and stroke. EXAM: CT ANGIOGRAPHY HEAD AND NECK TECHNIQUE: Multidetector CT imaging of the head and neck was performed using the standard protocol during bolus  administration of intravenous contrast. Multiplanar CT image reconstructions and MIPs were obtained to evaluate the vascular anatomy. Carotid stenosis measurements (when applicable) are obtained utilizing NASCET criteria, using the distal internal carotid diameter as the denominator. CONTRAST:  50 cc Isovue 370 COMPARISON:  CT HEAD October 18, 2016 at 1411 hours FINDINGS: CTA NECK AORTIC ARCH: 3 vessel aortic arch. Moderate calcific atherosclerosis of aortic arch and arch vessel origins. Moderate stenosis LEFT Common carotid and LEFT subclavian artery origins. RIGHT Common carotid artery origin is widely patent. Mild tandem stenoses RIGHT subclavian artery due to atherosclerosis. RIGHT CAROTID SYSTEM: Common carotid artery is widely patent, mild calcific atherosclerosis. Calcific atherosclerosis resulting in short segment 50% stenosis RIGHT internal carotid artery origin. Internal carotid artery is patent with mild distal calcific atherosclerosis. LEFT CAROTID SYSTEM: Common carotid artery is widely patent. Calcific atherosclerosis resulting in short segment of 60% stenosis LEFT internal carotid artery origin. Normal appearance of the included internal carotid artery. VERTEBRAL ARTERIES:RIGHT vertebral artery is dominant with tandem mild stenosis RIGHT P1 segment associated with calcific atherosclerosis. Moderate luminal irregularity RIGHT vertebral artery. RIGHT middle cerebral Diminutive LEFT vertebral artery. SKELETON: No acute osseous process though bone windows have not been submitted. Multilevel severe degenerative change of the cervical spine. OTHER NECK: Soft tissues of the neck are non-acute though, not tailored for evaluation. Subcentimeter hypodensity RIGHT thyroid lobe with scattered small calcifications, no indicated follow-up. CTA HEAD ANTERIOR CIRCULATION: Atherosclerosis resulting in moderate stenosis bilateral supraclinoid internal carotid artery's. Patent anterior communicating artery. Moderate  tandem stenoses RIGHT mid to distal anterior cerebral artery severe stenosis mid RIGHT A2 segment. Severe stenosis RIGHT proximal M2 superior division and inferior division. Moderate tandem stenoses bilateral mid to distal MCA segments. No large vessel occlusion, contrast extravasation or aneurysm. POSTERIOR CIRCULATION: Calcific atherosclerosis resulting in moderate stenoses RIGHT vertebral artery. Severely stenotic distal LEFT V4 segment. Diminutive basilar artery. Robust bilateral posterior communicating arteries with tandem severe stenoses bilateral posterior cerebral arteries. No large vessel occlusion, contrast extravasation or aneurysm. VENOUS SINUSES: Major dural venous sinuses are patent though not tailored for evaluation on this angiographic examination. ANATOMIC VARIANTS: Complete circle of Willis. DELAYED PHASE: No abnormal intracranial enhancement. MIP images reviewed. IMPRESSION: CTA NECK: 1. Atherosclerosis resulting in short segments 50% RIGHT and 60% LEFT stenosis internal carotid artery origins. 2. Moderate luminal irregularity RIGHT vertebral artery seen with atherosclerosis and old dissection without flow limiting stenosis. CTA head: 1. No emergent large vessel occlusion. 2. Moderate intracranial atherosclerosis with superimposed scattered severe stenoses of anterior and posterior circulation. Electronically Signed   By: Elon Alas M.D.   On: 10/18/2016 17:01   Dg Chest 2 View  Result Date: 10/19/2016 CLINICAL DATA:  Decreased responsiveness. EXAM: CHEST  2 VIEW COMPARISON:  CT 10/18/2016.  05/07/2016. FINDINGS: Mediastinum and hilar structures are normal. Heart size normal. Stable mild prominence of the great vessels. Heart size normal. Lungs are clear. No pleural effusion or pneumothorax. Diffuse osteopenia. Degenerative changes  and scoliosis thoracic spine. Degenerative changes both shoulders. Right shoulder is high-riding consistent rotator cuff tear. IMPRESSION: No acute  cardiopulmonary disease. Electronically Signed   By: [place]   On: 10/19/2016 06:37   Ct Angio Neck W Or Wo Contrast  Result Date: 10/18/2016 CLINICAL DATA:  Follow-up LEFT facial droop. History of hypertension, hyperlipidemia and stroke. EXAM: CT ANGIOGRAPHY HEAD AND NECK TECHNIQUE: Multidetector CT imaging of the head and neck was performed using the standard protocol during bolus administration of intravenous contrast. Multiplanar CT image reconstructions and MIPs were obtained to evaluate the vascular anatomy. Carotid stenosis measurements (when applicable) are obtained utilizing NASCET criteria, using the distal internal carotid diameter as the denominator. CONTRAST:  50 cc Isovue 370 COMPARISON:  CT HEAD October 18, 2016 at 1411 hours FINDINGS: CTA NECK AORTIC ARCH: 3 vessel aortic arch. Moderate calcific atherosclerosis of aortic arch and arch vessel origins. Moderate stenosis LEFT Common carotid and LEFT subclavian artery origins. RIGHT Common carotid artery origin is widely patent. Mild tandem stenoses RIGHT subclavian artery due to atherosclerosis. RIGHT CAROTID SYSTEM: Common carotid artery is widely patent, mild calcific atherosclerosis. Calcific atherosclerosis resulting in short segment 50% stenosis RIGHT internal carotid artery origin. Internal carotid artery is patent with mild distal calcific atherosclerosis. LEFT CAROTID SYSTEM: Common carotid artery is widely patent. Calcific atherosclerosis resulting in short segment of 60% stenosis LEFT internal carotid artery origin. Normal appearance of the included internal carotid artery. VERTEBRAL ARTERIES:RIGHT vertebral artery is dominant with tandem mild stenosis RIGHT P1 segment associated with calcific atherosclerosis. Moderate luminal irregularity RIGHT vertebral artery. RIGHT middle cerebral Diminutive LEFT vertebral artery. SKELETON: No acute osseous process though bone windows have not been submitted. Multilevel severe degenerative  change of the cervical spine. OTHER NECK: Soft tissues of the neck are non-acute though, not tailored for evaluation. Subcentimeter hypodensity RIGHT thyroid lobe with scattered small calcifications, no indicated follow-up. CTA HEAD ANTERIOR CIRCULATION: Atherosclerosis resulting in moderate stenosis bilateral supraclinoid internal carotid artery's. Patent anterior communicating artery. Moderate tandem stenoses RIGHT mid to distal anterior cerebral artery severe stenosis mid RIGHT A2 segment. Severe stenosis RIGHT proximal M2 superior division and inferior division. Moderate tandem stenoses bilateral mid to distal MCA segments. No large vessel occlusion, contrast extravasation or aneurysm. POSTERIOR CIRCULATION: Calcific atherosclerosis resulting in moderate stenoses RIGHT vertebral artery. Severely stenotic distal LEFT V4 segment. Diminutive basilar artery. Robust bilateral posterior communicating arteries with tandem severe stenoses bilateral posterior cerebral arteries. No large vessel occlusion, contrast extravasation or aneurysm. VENOUS SINUSES: Major dural venous sinuses are patent though not tailored for evaluation on this angiographic examination. ANATOMIC VARIANTS: Complete circle of Willis. DELAYED PHASE: No abnormal intracranial enhancement. MIP images reviewed. IMPRESSION: CTA NECK: 1. Atherosclerosis resulting in short segments 50% RIGHT and 60% LEFT stenosis internal carotid artery origins. 2. Moderate luminal irregularity RIGHT vertebral artery seen with atherosclerosis and old dissection without flow limiting stenosis. CTA head: 1. No emergent large vessel occlusion. 2. Moderate intracranial atherosclerosis with superimposed scattered severe stenoses of anterior and posterior circulation. Electronically Signed   By: Elon Alas M.D.   On: 10/18/2016 17:01   Mr Virginia Meyer TW Contrast  Result Date: 10/18/2016 CLINICAL DATA:  Follow-up stroke. EXAM: MRI HEAD WITHOUT AND WITH CONTRAST TECHNIQUE:  Multiplanar, multiecho pulse sequences of the [place] and surrounding structures were obtained without and with intravenous contrast. CONTRAST:  10 cc MULTIHANCE GADOBENATE DIMEGLUMINE 529 MG/ML IV SOLN COMPARISON:  CT HEAD October 18, 2016 at 1411 hours and CT angiogram head and neck  October 18, 2016 at 1622 hours FINDINGS: Multiple sequences are moderately motion degraded. Severely motion degraded post gadolinium coronal T1. INTRACRANIAL CONTENTS: No reduced diffusion to suggest acute ischemia. RIGHT frontotemporal parietal encephalomalacia with faint susceptibility artifact. No susceptibility artifact is suggests acute blood products appear chronic microhemorrhage LEFT frontal lobe. Ex vacuo dilatation RIGHT lateral ventricle. No hydrocephalus. Old small bilateral cerebellar infarcts. Faint T2 hyperintensities within knee at home I associated with chronic small vessel ischemic disease. Confluent supratentorial and patchy pontine white matter FLAIR T2 hyperintensities in part reflecting wallerian degeneration. No midline shift, mass effect or masses. No abnormal intraparenchymal or extra-axial enhancement. No extra-axial fluid collections. VASCULAR: Normal major intracranial vascular flow voids present at skull base. SKULL AND UPPER CERVICAL SPINE: No abnormal sellar expansion. Faint reduced diffusion, heterogeneous ADC values, low T1 and enhancing LEFT frontal calvarial 2.1 cm lesion. Heterogeneous calvarial bone marrow signal. Craniocervical junction maintained. SINUSES/ORBITS: The mastoid air-cells and included paranasal sinuses are well-aerated.The included ocular globes and orbital contents are non-suspicious. OTHER: None. IMPRESSION: 1. No acute intracranial process on this motion degraded examination. 2. Old Large RIGHT MCA territory infarct. Old small cerebellar infarcts. 3. Moderate to severe chronic small vessel ischemic disease. 4. Enhancing LEFT frontal calvarial 2.1 cm lesion. Heterogeneous calvarial bone  marrow signal. These are nonspecific, metastatic disease not excluded. Bone scan could be performed as clinically indicated though, may not be scintigraphically demonstrated. Electronically Signed   By: Elon Alas M.D.   On: 10/18/2016 22:13   Ct Head Code Stroke Wo Contrast  Result Date: 10/18/2016 CLINICAL DATA:  Code stroke. Sudden onset of right-sided facial droop. Personal history of stroke. EXAM: CT HEAD WITHOUT CONTRAST TECHNIQUE: Contiguous axial images were obtained from the base of the skull through the vertex without intravenous contrast. COMPARISON:  CT head 05/17/2016.  He FINDINGS: [place]: A large remote right MCA territory infarct is stable. Atrophy and diffuse white matter changes are noted bilaterally. Knees are similar the prior exam. No acute cortical infarct, hemorrhage, or mass lesion is present. Remote ischemic changes involving the internal capsule and bowel my bilaterally are stable. Diffuse white matter changes extending into the brainstem are similar the prior study. Remote cerebellar infarcts are stable. The ventricles are proportionate to the degree of atrophy with ex vacuo dilation on the right. No significant extra-axial fluid collection is present. Vascular: Atherosclerotic calcifications are present within the cavernous internal carotid artery is bilaterally. Calcifications are present at the dural margin of the right vertebral artery. There is no hyperdense vessel. Skull: Multiple lucent lesions are present throughout the calvarium. These were present on previous studies. This raises concern for metastatic disease or multiple myeloma. Hemangiomas are considered less likely. Sinuses/Orbits: The paranasal sinuses and mastoid air cells are clear. Bilateral lens replacements are present. The globes and orbits are otherwise within normal limits. ASPECTS [facility] Stroke Program Early CT Score) - Ganglionic level infarction (caudate, lentiform nuclei, internal capsule, insula,  M1-M3 cortex): 7/7 - Supraganglionic infarction (M4-M6 cortex): 3/3 Total score (0-10 with 10 being normal): 10/10 IMPRESSION: 1. Large remote right MCA territory nonhemorrhagic infarct is stable. 2. Remote ischemic changes involving the internal capsule, thalami, brainstem, and cerebellum are stable. 3. No acute intracranial abnormality. 4. Atherosclerosis. 5. Lucent lesions throughout the calvarium raising concern for metastatic disease or myeloma. 6. ASPECTS is 10/10 These results were called by telephone at the time of interpretation on 10/18/2016 at 2:29 pm to Dr. Leonel Ramsay, who verbally acknowledged these results. Electronically Signed   By: Harrell Gave  Mattern M.D.   On: 10/18/2016 14:30    Assessment and Plan:   1. Hx of CHB vs Junct rhythm with HR 30 and unresponsive on dilt, dig and Coreg. Resolved and no documented brady since that time.  She is on amiodarone with her hx of a flutter.   It may be beneficial to add 30 day event monitor to eval for any other brady. Vs EP consult and Linq--- Dr. Claiborne Billings to see. Echo without change with continued dynamic obstruction.   Also will decrease amiodarone to 100 mg.  2. Facial droop this admit with decreased responsiveness.  + carotid stenosis ? Vascular eval. 3. HTN     Labile this admit 96/41 to 144/66  At home on cozaar 100 ( not in med list in May) and hydralazine 10 mg BID also ? lisinopril 40 mg daily.  Will resume lisinopril only at 10 mg.   4. PAF currently in SR on amiodarone 200 mg daily,  CHA2DS2VAsc 6-7 on Eliquis no bleeding 5. HLD with elevated LDL 218 would add statin with her carotid disease. lipitor 40  6. No hx CAD   Signed, Cecilie Kicks, NP  10/19/2016 2:04 PM    Patient seen and examined. Agree with assessment and plan. Ms Kawamoto is an 83 year old female who is followed by Dr. Gwenlyn Found for cardiology care.  She has a history of prior CVA involving right MCA territory, history of syncope, hypertension, PAF for which she's been on  eliquis therapy.  In March following a syncopal spell.  She was taken off digoxin, carvedilol and diltiazem.  She developed subsequent atrial flutter and was placed on amiodarone and has been maintaining sinus rhythm.  Of note, an echo Doppler study in March 2018 demonstrated normal systolic function with grade 1 diastolic dysfunction, mild aortic valve stenosis, and it was evidence for an LV mid cavity gradient peak at 51 mm.  She had been on lisinopril for hypertension but apparently this was stopped when she developed a cough.  Some of her records, there is question that she may have been on losartan in the past.  Recently she's not been on any medication that she was aware of for hypertension.  She was admitted yesterday with right facial droop, which developed while having lunch.  An MRI of her head did not show any acute intracranial process, but again showed old large right middle cerebral artery return tori infarct.  She had moderate to severe chronic small vessel ischemic disease.  Previously she has been noted to have carotid stenosis, left greater than right, up to 70%.  She denies any chest pain or shortness of breath.  Her facial droop has resolved.  There was some question is whether or not her dose of amiodarone should be discontinued.  Presently, she is maintaining sinus rhythm.  Her ECG reveals sinus bradycardia at 58 bpm, LVH, incomplete left bundle branch block, and QTc interval at 482 ms. On exam, her blood pressure was 144/66.  P 64 .  There is no facial asymmetry.  There is a prominent left carotid bruit, and soft right carotid bruit.  Her lungs were clear.  Rhythm was regular with a 2/6 systolic murmur along the left sternal border.  Abdomen is soft and nontender.  Pulses were adequate.  There was no edema.  A repeat echo again shows an EF of 65-70%.  Again, a dynamic obstruction with a peak gradient of 46 mm is noted in the mid cavity of the left ventricle.  She has normal wall motion and  grade 1 diastolic dysfunction.  There is mild aortic stenosis with mild aortic insufficiency, mitral annular calcification with trivial MR, severe left atrial dilatation, and mild pulmonary hypertension with a peak PA pressure 33.  At present, would recommend an attempt at slight reduction of amiodarone to 100 mg daily, which hopefully will continue to be positive and maintain normal rhythm without recurrent atrial arrhythmia.  This should prevent significant bradycardia.  She has a history of hypertension, with diastolic dysfunction.  In the past.  She developed ACE inhibitor induced cough with lisinopril.  At present, would reinstitute low-dose losartan, initially at 25 mg and titrate as needed for more optimal blood pressure control.  With her LVOT obstruction.  Would avoid diuretics and dehydration which may  exacerbate this dynamic obstruction.  Consider follow-up evaluation of her carotids.  With her PVD, would attempt reinstitution of statin therapy particularly with her marked LDL elevation in attempt to induce plaque stability and reduce likelihood for progression of disease.  Troy Sine, MD, [facility] 10/19/2016 5:16 PM

## 2016-10-19 NOTE — Assessment & Plan Note (Signed)
Nonambulatory.  Able to stand with assistance.

## 2016-10-20 ENCOUNTER — Encounter (HOSPITAL_COMMUNITY): Payer: Self-pay

## 2016-10-20 LAB — PROTEIN ELECTROPHORESIS, SERUM
A/G RATIO SPE: 1.5 (ref 0.7–1.7)
ALPHA-1-GLOBULIN: 0.2 g/dL (ref 0.0–0.4)
ALPHA-2-GLOBULIN: 0.6 g/dL (ref 0.4–1.0)
Albumin ELP: 3.5 g/dL (ref 2.9–4.4)
BETA GLOBULIN: 0.8 g/dL (ref 0.7–1.3)
GLOBULIN, TOTAL: 2.4 g/dL (ref 2.2–3.9)
Gamma Globulin: 0.7 g/dL (ref 0.4–1.8)
Total Protein ELP: 5.9 g/dL — ABNORMAL LOW (ref 6.0–8.5)

## 2016-10-20 MED ORDER — ATORVASTATIN CALCIUM 40 MG PO TABS
40.0000 mg | ORAL_TABLET | Freq: Every day | ORAL | Status: DC
  Administered 2016-10-20: 40 mg via ORAL
  Filled 2016-10-20: qty 1

## 2016-10-20 MED ORDER — LOSARTAN POTASSIUM 50 MG PO TABS
50.0000 mg | ORAL_TABLET | Freq: Every day | ORAL | Status: DC
  Administered 2016-10-21: 50 mg via ORAL
  Filled 2016-10-20: qty 1

## 2016-10-20 MED ORDER — DIPHENHYDRAMINE HCL 25 MG PO CAPS
25.0000 mg | ORAL_CAPSULE | Freq: Once | ORAL | Status: AC
  Administered 2016-10-20: 25 mg via ORAL
  Filled 2016-10-20: qty 1

## 2016-10-20 MED ORDER — HYDRALAZINE HCL 20 MG/ML IJ SOLN
5.0000 mg | INTRAMUSCULAR | Status: DC | PRN
  Administered 2016-10-20 – 2016-10-21 (×2): 5 mg via INTRAVENOUS
  Filled 2016-10-20: qty 1

## 2016-10-20 MED ORDER — HYDRALAZINE HCL 20 MG/ML IJ SOLN
10.0000 mg | INTRAMUSCULAR | Status: DC | PRN
  Administered 2016-10-20: 10 mg via INTRAVENOUS
  Filled 2016-10-20 (×2): qty 1

## 2016-10-20 NOTE — Progress Notes (Signed)
24 Hour urine starts now at 1130AM and will end at 1130AM (10/21/2016).   Ave Filter, RN

## 2016-10-20 NOTE — Progress Notes (Signed)
   10/20/16 1155  Vitals  BP (!) 200/70  BP Location Right Arm  BP Method Manual  Patient Position (if appropriate) Lying  Pulse Rate 60  Pulse Rate Source Apical  Pain Assessment  Pain Assessment No/denies pain  Complaints & Interventions  Complains of Itching   Family Medicine teaching service paged. RN will continue to monitor.

## 2016-10-20 NOTE — Progress Notes (Addendum)
Family Medicine Teach Service Note Daily Progress Note Intern Pager: [phone]  Patient name: Virginia Meyer Medical record number: [id number] Date of birth: 10-29-1928 Age: 83 y.o. Gender: female  Primary Care Provider: Reymundo Poll, MD Consultants: Neurology, cardiology, PT/OT Code Status: Partial  Pt Overview and Major Events to Date:  8/27: Admit to FPTS for TIA vs syncope work up  Assessment and Plan:  Harlem Thresher is a 83 y.o. female presenting with episode of decreased responsiveness and ? New right facial droop . 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 fib on Xarelto, PVD, protein calorie malnutrition, hearing impairment  Syncopal episode: Episode of decreased responsiveness and ?R sided facial droop on 8/27 at ALF but resolved upon presentation to [facility] ED. Leading differentials still includes syncope (cardiologic vs neurogenic cause) vs TIA. Workup negative for acute stroke and neuro signed off. Orthostatic vitals are positive for orthostasis.  - Telemetry - Echo ordered - Neuro checks q2 - PT recommend SNF, 24h assistance - cardiology consulted, appreciate recs  Hypertension: Was hypertensive this morning, 182/68. Also orthostatic vitals positive. Home antihypertensives include cozaar 100 mg and hydralazine 10 mg BID - 25mg  cozaar  - cautious BP control given labile pressure with low at 96/41 yesterday AM - will add hydral PRN high pressures  Hx of complete heart block and Paroxysmal A. Fib: Currently rate controlled. Hx of heart block possibly playing a role into possible syncopal episode that happened at ALF. -Continue eliquis --Amiodarone was reduced to 100mg  by cardiology -Cardiology consulted, appreciate their assistance - considering EP consult vs 30 day event monitor   HFpEF, G1DD: currently euvolemic, systolic murmer on exam. August 29 Echo: EF65-70%, G1DD. Normal LV  size. Normal RV size and systolic function. Severe left atrial enlargement. Normal RA size. Mild aortic stenosis.   Hx of R CVA: Residual left sided facial droop and decreased strength in LUE and LLE.  -Will add Atorvastatin 40mg  today, patient agreeable  Protein calorie malnutrition: On patient's problem list -Nutrition consult  Hyperlipidemia: Patient will begin statin medication. Lipid panel showing Cholesterol 281, LDL 218, and HDL39.  - Will add Atorvastatin 40mg  today  Mild AKI: Resolved - Daily BMP - oral rehydration   Lesion on Ruberg MRI: uncertain etiology, nonspecific on imaging. Lesion left frontal calvarium, ~ 2cm. ?malignancy. Patient does not want a workup at this time. Discussed this with her at length this AM. She is very clear on not wanting further workup. -Will not do a breast exam, cancel SPEP and UPEP, will not plan for bone scan  Urinary incontinence: Wears adult diaper at home - Continue adult diaper and consider female condom cath while here  Wheelchair bound: Unable to safely ambulate at ALF. Uses wheelchair - PT/OT  Hx of Anxiety and MDD:  - Continue home Zoloft and Seroquel  Hx of OA and neuropathy - home tramadol and gabapentin - held tramadol and reduced gabapentin yesterday in setting of lower pressures, can add back as needed  FEN/GI: Saline lock IV, heart healthy diet PPx: Home eliquis  Disposition: Likely back to ALF after medically stable  Subjective:  [name] is doing well this morning without any acute events overnight. She mentioned some itching around her IV of the right arm. I spoke to nursing about this. She also states that her appetite is returning and she feels better today.  Objective: Temp:  [97.3 F (36.3 C)-98.2 F (36.8 C)] 98.1  F (36.7 C) (08/29 0700) Pulse Rate:  [64-89] 64 (08/29 0700) Resp:  [18-20] 18 (08/29 0700) BP: (96-182)/(41-83) 182/68 (08/29 0700) SpO2:  [94 %-99 %] 95 % (08/29 0700)  Physical  Exam: General: Pleasant elderly female in no acute distress Cardiovascular: Regular rate and rhythm, normal D7/A1, 3/6 systolic murmur, without rubs or gallops Respiratory: Clear to auscultation bilaterally with normal work of breathing Abdomen: Soft, nondistended, no tenderness to palpation, normal bowel sounds Extremities: Normal movements and tone. Left posterior thigh tightness but says this is normal. Skin: Erythematous medial elbow at site of IV. No other rashes, no bruising or petechiae. Neurologic exam Mental status:alert, oriented x3 Cranial Nerves: Residual left sided facial droop with asymetric smile of the left side, rest of exam is normal Motor: 5/5 RUE, 4/5 RLE, 4/5 LUE, 3/5 LLE Sensory: Grossly intact throughout  Laboratory:  Recent Labs Lab 10/18/16 1403 10/18/16 1413 10/19/16 0708  WBC 6.4  --  6.4  HGB 12.6 13.6 12.6  HCT 39.4 40.0 38.7  PLT 146*  --  166    Recent Labs Lab 10/18/16 1403 10/18/16 1413 10/19/16 0708  NA 139 140 140  K 4.3 4.3 3.8  CL 106 104 106  CO2 23  --  25  BUN 22* 27* 21*  CREATININE 1.23* 1.10* 1.00  CALCIUM 9.4  --  9.5  PROT 6.4*  --   --   BILITOT 0.5  --   --   ALKPHOS 95  --   --   ALT 25  --   --   AST 34  --   --   GLUCOSE 113* 117* 91    Imaging/Diagnostic Tests: No results found.  Ophelia Charter, MS4  RESIDENT ADDENDUM  I saw and evaluated the patient, performing the key elements of service. I developed the management plan that is described in the Medical Student's note, and I agree with the content, making changing as needed. My detailed findings are below.  Physical Exam:  BP (!) 182/68 (BP Location: Left Arm)   Pulse 64   Temp 98.1 F (36.7 C) (Oral)   Resp 18   Ht 5' (1.524 m)   Wt 117 lb 4.6 oz (53.2 kg)   SpO2 95%   BMI 22.91 kg/m   General Appearance:   Chronically-ill appearing female rests in bed in no acute distress, nontoxic  HENT:  PERRL, EOMI  Lungs:   CTA bil, no W/R/R  Heart:    Regular rate, +2/8 systolic murmer heard best at R second intercostal space  Abdomen:   Soft, non-distended  Musculoskeletal:  Grossly normal age-appropriate movements, tone, and strength  Neurologic:   Alert, no cranial nerve deficits, normal strength and tone, gait steady   Everrett Coombe 10/20/2016, 9:43 AM PGY-2, [place]

## 2016-10-20 NOTE — Progress Notes (Signed)
Progress Note  Patient Name: Virginia Meyer Date of Encounter: 10/20/2016  Primary Cardiologist: Gwenlyn Found  Subjective   No chest pain or dyspnea  Inpatient Medications    Scheduled Meds: . amiodarone  100 mg Oral Daily  . apixaban  2.5 mg Oral BID  . atorvastatin  40 mg Oral q1800  . Chlorhexidine Gluconate Cloth  6 each Topical Q0600  . gabapentin  200 mg Oral QHS  . lactose free nutrition  237 mL Oral Daily  . [START ON 10/21/2016] losartan  50 mg Oral Daily  . mupirocin ointment  1 application Nasal BID  . MUSCLE RUB  1 application Topical QID  . QUEtiapine  25 mg Oral Daily  . sertraline  25 mg Oral Daily   Continuous Infusions:  PRN Meds: acetaminophen **OR** acetaminophen (TYLENOL) oral liquid 160 mg/5 mL **OR** acetaminophen, hydrALAZINE, senna-docusate   Vital Signs    Vitals:   10/20/16 0700 10/20/16 1118 10/20/16 1155 10/20/16 1351  BP: (!) 182/68 (!) 238/59 (!) 200/70 (!) 218/60  Pulse: 64 62 60 67  Resp: 18     Temp: 98.1 F (36.7 C) 98 F (36.7 C)    TempSrc: Oral Oral    SpO2: 95% 98%  98%  Weight:      Height:        Intake/Output Summary (Last 24 hours) at 10/20/16 1437 Last data filed at 10/20/16 1400  Gross per 24 hour  Intake              240 ml  Output              125 ml  Net              115 ml    I/O since admission: +115  Filed Weights   10/18/16 2346 10/19/16 0146  Weight: 117 lb 4.6 oz (53.2 kg) 117 lb 4.6 oz (53.2 kg)    Telemetry    Sinus in the 70s - Personally Reviewed  ECG    ECG (independently read by me): Sinus rhythm at 65 without significant ST changes  Physical Exam   BP (!) 218/60 (BP Location: Right Arm)   Pulse 67   Temp 98 F (36.7 C) (Oral)   Resp 18   Ht 5' (1.524 m)   Wt 117 lb 4.6 oz (53.2 kg)   SpO2 98%   BMI 22.91 kg/m  General: Alert, oriented, no distress.  Skin: normal turgor, no rashes, warm and dry HEENT: Normocephalic, atraumatic. Pupils equal round and reactive to light; sclera  anicteric; extraocular muscles intact; mild old left facial droop Nose without nasal septal hypertrophy Mouth/Parynx benign; Mallinpatti scale 3 Neck: No JVD, left >> R carotid bruits; normal carotid upstroke Lungs: clear to ausculatation and percussion; no wheezing or rales Chest wall: without tenderness to palpitation Heart: PMI not displaced, RRR, s1 s2 normal, 2-3/6 left sternal systolic murmur, no diastolic murmur, no rubs, gallops, thrills, or heaves Abdomen: soft, nontender; no hepatosplenomehaly, BS+; abdominal aorta nontender and not dilated by palpation. Back: no CVA tenderness Pulses 2+ Musculoskeletal: full range of motion, normal strength, no joint deformities Extremities: no clubbing cyanosis or edema, Homan's sign negative  Neurologic: grossly nonfocal; Cranial nerves grossly wnl Psychologic: Normal mood and affect   Labs    Chemistry Recent Labs Lab 10/18/16 1403 10/18/16 1413 10/19/16 0708  NA 139 140 140  K 4.3 4.3 3.8  CL 106 104 106  CO2 23  --  25  GLUCOSE 113*  117* 91  BUN 22* 27* 21*  CREATININE 1.23* 1.10* 1.00  CALCIUM 9.4  --  9.5  PROT 6.4*  --   --   ALBUMIN 3.8  --   --   AST 34  --   --   ALT 25  --   --   ALKPHOS 95  --   --   BILITOT 0.5  --   --   GFRNONAA 39*  --  50*  GFRAA 45*  --  58*  ANIONGAP 10  --  9     Hematology Recent Labs Lab 10/18/16 1403 10/18/16 1413 10/19/16 0708  WBC 6.4  --  6.4  RBC 4.28  --  4.24  HGB 12.6 13.6 12.6  HCT 39.4 40.0 38.7  MCV 92.1  --  91.3  MCH 29.4  --  29.7  MCHC 32.0  --  32.6  RDW 14.7  --  15.1  PLT 146*  --  166    Cardiac Enzymes Recent Labs Lab 10/19/16 0004  TROPONINI <0.03    Recent Labs Lab 10/18/16 1411  TROPIPOC 0.00     BNPNo results for input(s): BNP, PROBNP in the last 168 hours.   DDimer No results for input(s): DDIMER in the last 168 hours.   Lipid Panel     Component Value Date/Time   CHOL 281 (H) 10/19/2016 0708   TRIG 121 10/19/2016 0708   HDL 39  (L) 10/19/2016 0708   CHOLHDL 7.2 10/19/2016 0708   VLDL 24 10/19/2016 0708   LDLCALC 218 (H) 10/19/2016 0708    Radiology    Ct Angio Head W Or Wo Contrast  Result Date: 10/18/2016 CLINICAL DATA:  Follow-up LEFT facial droop. History of hypertension, hyperlipidemia and stroke. EXAM: CT ANGIOGRAPHY HEAD AND NECK TECHNIQUE: Multidetector CT imaging of the head and neck was performed using the standard protocol during bolus administration of intravenous contrast. Multiplanar CT image reconstructions and MIPs were obtained to evaluate the vascular anatomy. Carotid stenosis measurements (when applicable) are obtained utilizing NASCET criteria, using the distal internal carotid diameter as the denominator. CONTRAST:  50 cc Isovue 370 COMPARISON:  CT HEAD October 18, 2016 at 1411 hours FINDINGS: CTA NECK AORTIC ARCH: 3 vessel aortic arch. Moderate calcific atherosclerosis of aortic arch and arch vessel origins. Moderate stenosis LEFT Common carotid and LEFT subclavian artery origins. RIGHT Common carotid artery origin is widely patent. Mild tandem stenoses RIGHT subclavian artery due to atherosclerosis. RIGHT CAROTID SYSTEM: Common carotid artery is widely patent, mild calcific atherosclerosis. Calcific atherosclerosis resulting in short segment 50% stenosis RIGHT internal carotid artery origin. Internal carotid artery is patent with mild distal calcific atherosclerosis. LEFT CAROTID SYSTEM: Common carotid artery is widely patent. Calcific atherosclerosis resulting in short segment of 60% stenosis LEFT internal carotid artery origin. Normal appearance of the included internal carotid artery. VERTEBRAL ARTERIES:RIGHT vertebral artery is dominant with tandem mild stenosis RIGHT P1 segment associated with calcific atherosclerosis. Moderate luminal irregularity RIGHT vertebral artery. RIGHT middle cerebral Diminutive LEFT vertebral artery. SKELETON: No acute osseous process though bone windows have not been  submitted. Multilevel severe degenerative change of the cervical spine. OTHER NECK: Soft tissues of the neck are non-acute though, not tailored for evaluation. Subcentimeter hypodensity RIGHT thyroid lobe with scattered small calcifications, no indicated follow-up. CTA HEAD ANTERIOR CIRCULATION: Atherosclerosis resulting in moderate stenosis bilateral supraclinoid internal carotid artery's. Patent anterior communicating artery. Moderate tandem stenoses RIGHT mid to distal anterior cerebral artery severe stenosis mid RIGHT A2 segment. Severe  stenosis RIGHT proximal M2 superior division and inferior division. Moderate tandem stenoses bilateral mid to distal MCA segments. No large vessel occlusion, contrast extravasation or aneurysm. POSTERIOR CIRCULATION: Calcific atherosclerosis resulting in moderate stenoses RIGHT vertebral artery. Severely stenotic distal LEFT V4 segment. Diminutive basilar artery. Robust bilateral posterior communicating arteries with tandem severe stenoses bilateral posterior cerebral arteries. No large vessel occlusion, contrast extravasation or aneurysm. VENOUS SINUSES: Major dural venous sinuses are patent though not tailored for evaluation on this angiographic examination. ANATOMIC VARIANTS: Complete circle of Willis. DELAYED PHASE: No abnormal intracranial enhancement. MIP images reviewed. IMPRESSION: CTA NECK: 1. Atherosclerosis resulting in short segments 50% RIGHT and 60% LEFT stenosis internal carotid artery origins. 2. Moderate luminal irregularity RIGHT vertebral artery seen with atherosclerosis and old dissection without flow limiting stenosis. CTA head: 1. No emergent large vessel occlusion. 2. Moderate intracranial atherosclerosis with superimposed scattered severe stenoses of anterior and posterior circulation. Electronically Signed   By: Elon Alas M.D.   On: 10/18/2016 17:01   Dg Chest 2 View  Result Date: 10/19/2016 CLINICAL DATA:  Decreased responsiveness. EXAM:  CHEST  2 VIEW COMPARISON:  CT 10/18/2016.  05/07/2016. FINDINGS: Mediastinum and hilar structures are normal. Heart size normal. Stable mild prominence of the great vessels. Heart size normal. Lungs are clear. No pleural effusion or pneumothorax. Diffuse osteopenia. Degenerative changes and scoliosis thoracic spine. Degenerative changes both shoulders. Right shoulder is high-riding consistent rotator cuff tear. IMPRESSION: No acute cardiopulmonary disease. Electronically Signed   By: [place]   On: 10/19/2016 06:37   Ct Angio Neck W Or Wo Contrast  Result Date: 10/18/2016 CLINICAL DATA:  Follow-up LEFT facial droop. History of hypertension, hyperlipidemia and stroke. EXAM: CT ANGIOGRAPHY HEAD AND NECK TECHNIQUE: Multidetector CT imaging of the head and neck was performed using the standard protocol during bolus administration of intravenous contrast. Multiplanar CT image reconstructions and MIPs were obtained to evaluate the vascular anatomy. Carotid stenosis measurements (when applicable) are obtained utilizing NASCET criteria, using the distal internal carotid diameter as the denominator. CONTRAST:  50 cc Isovue 370 COMPARISON:  CT HEAD October 18, 2016 at 1411 hours FINDINGS: CTA NECK AORTIC ARCH: 3 vessel aortic arch. Moderate calcific atherosclerosis of aortic arch and arch vessel origins. Moderate stenosis LEFT Common carotid and LEFT subclavian artery origins. RIGHT Common carotid artery origin is widely patent. Mild tandem stenoses RIGHT subclavian artery due to atherosclerosis. RIGHT CAROTID SYSTEM: Common carotid artery is widely patent, mild calcific atherosclerosis. Calcific atherosclerosis resulting in short segment 50% stenosis RIGHT internal carotid artery origin. Internal carotid artery is patent with mild distal calcific atherosclerosis. LEFT CAROTID SYSTEM: Common carotid artery is widely patent. Calcific atherosclerosis resulting in short segment of 60% stenosis LEFT internal carotid  artery origin. Normal appearance of the included internal carotid artery. VERTEBRAL ARTERIES:RIGHT vertebral artery is dominant with tandem mild stenosis RIGHT P1 segment associated with calcific atherosclerosis. Moderate luminal irregularity RIGHT vertebral artery. RIGHT middle cerebral Diminutive LEFT vertebral artery. SKELETON: No acute osseous process though bone windows have not been submitted. Multilevel severe degenerative change of the cervical spine. OTHER NECK: Soft tissues of the neck are non-acute though, not tailored for evaluation. Subcentimeter hypodensity RIGHT thyroid lobe with scattered small calcifications, no indicated follow-up. CTA HEAD ANTERIOR CIRCULATION: Atherosclerosis resulting in moderate stenosis bilateral supraclinoid internal carotid artery's. Patent anterior communicating artery. Moderate tandem stenoses RIGHT mid to distal anterior cerebral artery severe stenosis mid RIGHT A2 segment. Severe stenosis RIGHT proximal M2 superior division and inferior division. Moderate tandem  stenoses bilateral mid to distal MCA segments. No large vessel occlusion, contrast extravasation or aneurysm. POSTERIOR CIRCULATION: Calcific atherosclerosis resulting in moderate stenoses RIGHT vertebral artery. Severely stenotic distal LEFT V4 segment. Diminutive basilar artery. Robust bilateral posterior communicating arteries with tandem severe stenoses bilateral posterior cerebral arteries. No large vessel occlusion, contrast extravasation or aneurysm. VENOUS SINUSES: Major dural venous sinuses are patent though not tailored for evaluation on this angiographic examination. ANATOMIC VARIANTS: Complete circle of Willis. DELAYED PHASE: No abnormal intracranial enhancement. MIP images reviewed. IMPRESSION: CTA NECK: 1. Atherosclerosis resulting in short segments 50% RIGHT and 60% LEFT stenosis internal carotid artery origins. 2. Moderate luminal irregularity RIGHT vertebral artery seen with atherosclerosis and  old dissection without flow limiting stenosis. CTA head: 1. No emergent large vessel occlusion. 2. Moderate intracranial atherosclerosis with superimposed scattered severe stenoses of anterior and posterior circulation. Electronically Signed   By: Elon Alas M.D.   On: 10/18/2016 17:01   Mr Rosy Estabrook DG Contrast  Result Date: 10/18/2016 CLINICAL DATA:  Follow-up stroke. EXAM: MRI HEAD WITHOUT AND WITH CONTRAST TECHNIQUE: Multiplanar, multiecho pulse sequences of the Kruschke and surrounding structures were obtained without and with intravenous contrast. CONTRAST:  10 cc MULTIHANCE GADOBENATE DIMEGLUMINE 529 MG/ML IV SOLN COMPARISON:  CT HEAD October 18, 2016 at 1411 hours and CT angiogram head and neck October 18, 2016 at 1622 hours FINDINGS: Multiple sequences are moderately motion degraded. Severely motion degraded post gadolinium coronal T1. INTRACRANIAL CONTENTS: No reduced diffusion to suggest acute ischemia. RIGHT frontotemporal parietal encephalomalacia with faint susceptibility artifact. No susceptibility artifact is suggests acute blood products appear chronic microhemorrhage LEFT frontal lobe. Ex vacuo dilatation RIGHT lateral ventricle. No hydrocephalus. Old small bilateral cerebellar infarcts. Faint T2 hyperintensities within knee at home I associated with chronic small vessel ischemic disease. Confluent supratentorial and patchy pontine white matter FLAIR T2 hyperintensities in part reflecting wallerian degeneration. No midline shift, mass effect or masses. No abnormal intraparenchymal or extra-axial enhancement. No extra-axial fluid collections. VASCULAR: Normal major intracranial vascular flow voids present at skull base. SKULL AND UPPER CERVICAL SPINE: No abnormal sellar expansion. Faint reduced diffusion, heterogeneous ADC values, low T1 and enhancing LEFT frontal calvarial 2.1 cm lesion. Heterogeneous calvarial bone marrow signal. Craniocervical junction maintained. SINUSES/ORBITS: The mastoid  air-cells and included paranasal sinuses are well-aerated.The included ocular globes and orbital contents are non-suspicious. OTHER: None. IMPRESSION: 1. No acute intracranial process on this motion degraded examination. 2. Old Large RIGHT MCA territory infarct. Old small cerebellar infarcts. 3. Moderate to severe chronic small vessel ischemic disease. 4. Enhancing LEFT frontal calvarial 2.1 cm lesion. Heterogeneous calvarial bone marrow signal. These are nonspecific, metastatic disease not excluded. Bone scan could be performed as clinically indicated though, may not be scintigraphically demonstrated. Electronically Signed   By: Elon Alas M.D.   On: 10/18/2016 22:13    Cardiac Studies   ------------------------------------------------------------------- 10/19/16 ECHO Study Conclusions  - Left ventricle: The cavity size was normal. Wall thickness was   increased in a pattern of moderate LVH. Systolic function was   vigorous. The estimated ejection fraction was in the range of 65%   to 70%. There was dynamic obstruction. Mid cavitary gradient   3.74m/s, peak gradient 74mmHg. Wall motion was normal; there were   no regional wall motion abnormalities. Doppler parameters are   consistent with abnormal left ventricular relaxation (grade 1   diastolic dysfunction). - Aortic valve: Trileaflet; moderately thickened, moderately   calcified leaflets. There was mild stenosis. There was mild  regurgitation. Peak velocity (S): 279 cm/s. Mean gradient (S): 16   mm Hg. Valve area (VTI): 1.29 cm^2. Valve area (Vmax): 1.23 cm^2.   Valve area (Vmean): 1.34 cm^2. - Mitral valve: Moderately calcified annulus. There was trivial   regurgitation. - Left atrium: The atrium was severely dilated. - Pulmonary arteries: Systolic pressure was mildly increased. PA   peak pressure: 33 mm Hg (S).  Impressions:  - Compared to the prior study, there has been no significant   interval change.  Patient Profile      83 y.o. female with a hx of syncope, CVAs, HTN,  PAF on Eliquis who was seen today for the evaluation of cardiac reason for syncope with h/o bradycardia at the request of Dr. McDiarmid.  Assessment & Plan    1. PAF/Flutter: maintaining sinus rhythm currently in the 70s. Amiodarone dose was reduced to 100 mg daily.  No recurrent atrial arrhythmia  2. Accelerated HTN:  H/o cough with lisinopril;  Losartan resumed yesterday; and blood pressure elevation today for which she has received hydralazine 10 mg IV when necessary.  Would titrate losartan to 50 mg today, but possibly may need further dose titration to 50 mg twice a day.  Consider initiation of oral hydralazine if blood pressure continues to be significantly elevated.  3. Carotid disease; lateral bruits, left greater than right, with previous duplex and CT imaging, suggesting least 50% on the right and 60% in the left  4. Vascular atherosclerosis in the aorta intracranially  5. Hyperlipidemia with LDL 218; atorvastatin 40 mg started.  Consider addition of Zetia 10 mg for aggressive treatment.  4. LVOT obstruction; noted on remote and most recent echo Doppler assessment. Avoid diuretics and dehydration. Question if related to prior presyncope.    Signed, Troy Sine, MD, [facility] 10/20/2016, 2:37 PM

## 2016-10-20 NOTE — Progress Notes (Signed)
   10/20/16 1820  Vitals  Temp 98.2 F (36.8 C)  Temp Source Oral  BP (!) 185/63  BP Location Right Arm  BP Method Automatic  Patient Position (if appropriate) Lying  Pulse Rate 64  Pulse Rate Source Dinamap  Resp 20  Oxygen Therapy  SpO2 96 %  O2 Device Room Air  Pain Assessment  Pain Assessment No/denies pain  Complaints & Interventions  Complains of Itching   MD notified. Patient denies pain, SOB, difficulty breathing. RN will continue to monitor patient.

## 2016-10-21 ENCOUNTER — Ambulatory Visit: Payer: Medicare HMO | Admitting: Physical Therapy

## 2016-10-21 DIAGNOSIS — R4189 Other symptoms and signs involving cognitive functions and awareness: Secondary | ICD-10-CM

## 2016-10-21 LAB — BASIC METABOLIC PANEL
Anion gap: 7 (ref 5–15)
BUN: 22 mg/dL — AB (ref 6–20)
CHLORIDE: 109 mmol/L (ref 101–111)
CO2: 25 mmol/L (ref 22–32)
CREATININE: 0.97 mg/dL (ref 0.44–1.00)
Calcium: 9.4 mg/dL (ref 8.9–10.3)
GFR calc non Af Amer: 52 mL/min — ABNORMAL LOW (ref >60)
Glucose, Bld: 91 mg/dL (ref 65–99)
Potassium: 3.9 mmol/L (ref 3.5–5.1)
Sodium: 141 mmol/L (ref 135–145)

## 2016-10-21 LAB — CBC
HEMATOCRIT: 38.1 % (ref 36.0–46.0)
HEMOGLOBIN: 11.9 g/dL — AB (ref 12.0–15.0)
MCH: 29 pg (ref 26.0–34.0)
MCHC: 31.2 g/dL (ref 30.0–36.0)
MCV: 92.7 fL (ref 78.0–100.0)
Platelets: 151 10*3/uL (ref 150–400)
RBC: 4.11 MIL/uL (ref 3.87–5.11)
RDW: 14.7 % (ref 11.5–15.5)
WBC: 6.5 10*3/uL (ref 4.0–10.5)

## 2016-10-21 MED ORDER — ATORVASTATIN CALCIUM 40 MG PO TABS: 40.0000 mg | ORAL_TABLET | Freq: Every day | ORAL | 0 refills | Status: DC

## 2016-10-21 MED ORDER — AMIODARONE HCL 100 MG PO TABS: 100.0000 mg | ORAL_TABLET | Freq: Every day | ORAL | 0 refills | Status: DC

## 2016-10-21 MED ORDER — LOSARTAN POTASSIUM 50 MG PO TABS: 50.0000 mg | ORAL_TABLET | Freq: Every day | ORAL | 0 refills | Status: DC

## 2016-10-21 NOTE — Progress Notes (Signed)
Physical Therapy Treatment Patient Details Name: Virginia Meyer MRN: [id number] DOB: 09-Jul-1928 Today's Date: 10/21/2016    History of Present Illness 83 y.o. female presenting with episode of decreased responsiveness and New right facial droop . 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 fib on Xarelto, PVD, protein calorie malnutrition, hearing impairment MRI complete on 8/28 and showed no acute intracranial process and enhancing Left frontal calvarial lesion.    PT Comments    Pt seen for mobility progression, making very slow progress and continues to require significant physical assistance for transfers. Pt would continue to benefit from skilled physical therapy services at this time while admitted and after d/c to address the below listed limitations in order to improve overall safety and independence with functional mobility.    Follow Up Recommendations  Home health PT;Other (comment) (return to ALF with max assist and HHPT)     Equipment Recommendations  None recommended by PT    Recommendations for Other Services       Precautions / Restrictions Precautions Precautions: Fall Restrictions Weight Bearing Restrictions: No    Mobility  Bed Mobility Overal bed mobility: Needs Assistance Bed Mobility: Supine to Sit     Supine to sit: Min guard;HOB elevated     General bed mobility comments: increased time and effort, min guard to achieve sitting EOB; assist with use of bed pads to scoot forwards and achieve sitting EOB  Transfers Overall transfer level: Needs assistance Equipment used: 2 person hand held assist Transfers: Sit to/from [employer] Transfers Sit to Stand: Max assist;+2 physical assistance Stand pivot transfers: Max assist;+2 physical assistance       General transfer comment: max assist to power up and transfer to chair, cueing for  technique  Ambulation/Gait                 Stairs            Wheelchair Mobility    Modified Rankin (Stroke Patients Only)       Balance Overall balance assessment: Needs assistance Sitting-balance support: Bilateral upper extremity supported;Feet supported Sitting balance-Leahy Scale: Poor Sitting balance - Comments: L lateral lean  Postural control: Left lateral lean;Posterior lean Standing balance support: During functional activity;Bilateral upper extremity supported Standing balance-Leahy Scale: Zero Standing balance comment: Max assist x2 for upright postioning, unable to fully obtain erect position                            Cognition Arousal/Alertness: Awake/alert Behavior During Therapy: WFL for tasks assessed/performed;Flat affect Overall Cognitive Status: No family/caregiver present to determine baseline cognitive functioning                                        Exercises      General Comments        Pertinent Vitals/Pain Pain Assessment: Faces Faces Pain Scale: Hurts even more Pain Location: generalized with mobility Pain Descriptors / Indicators: Grimacing;Guarding;Moaning Pain Intervention(s): Monitored during session;Repositioned    Home Living                      Prior Function            PT Goals (current goals can now be found in the care plan section) Acute  Rehab PT Goals PT Goal Formulation: With patient Time For Goal Achievement: 11/02/16 Potential to Achieve Goals: Fair Progress towards PT goals: Progressing toward goals    Frequency    Min 2X/week      PT Plan Current plan remains appropriate    Co-evaluation              AM-PAC PT "6 Clicks" Daily Activity  Outcome Measure  Difficulty turning over in bed (including adjusting bedclothes, sheets and blankets)?: A Lot Difficulty moving from lying on back to sitting on the side of the bed? : A Lot Difficulty sitting  down on and standing up from a chair with arms (e.g., wheelchair, bedside commode, etc,.)?: Unable Help needed moving to and from a bed to chair (including a wheelchair)?: A Lot Help needed walking in hospital room?: A Lot Help needed climbing 3-5 steps with a railing? : Total 6 Click Score: 10    End of Session Equipment Utilized During Treatment: Gait belt Activity Tolerance: Patient tolerated treatment well Patient left: in chair;with call bell/phone within reach Nurse Communication: Mobility status PT Visit Diagnosis: Muscle weakness (generalized) (M62.81);Other symptoms and signs involving the nervous system (R29.898)     Time: 2947-6546 PT Time Calculation (min) (ACUTE ONLY): 24 min  Charges:  $Therapeutic Activity: 23-37 mins                    G Codes:       [place], [place], [place] 10/21/2016, 3:59 PM

## 2016-10-21 NOTE — Discharge Summary (Signed)
[facility] Discharge Summary  Patient name: Virginia Meyer Medical record number: [id number] Date of birth: 1928-02-25 Age: 83 y.o. Gender: female Date of Admission: 10/18/2016  Date of Discharge: 10/21/2016 Admitting Physician: Blane Ohara McDiarmid, MD  Primary Care Provider: Reymundo Poll, MD Consultants: Neurology, Cardiology  Indication for Hospitalization: syncope  Discharge Diagnoses/Problem List:  Patient Active Problem List   Diagnosis Date Noted  . Decreased responsiveness   . Bradycardia 10/19/2016  . Hypotension 10/19/2016  . Skull lesion 10/19/2016  . History of cerebrovascular accident (CVA) with residual deficit 10/19/2016  . Personal history of atrial flutter 10/19/2016  . Thoracic aortic atherosclerosis ([place]) 10/19/2016  . Intracranial atherosclerosis 10/19/2016  . Frailty syndrome in geriatric patient 10/19/2016  . Acute kidney injury ([place]) 10/19/2016  . 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 Syncope, Recurrent 10/18/2016  . Carotid artery disease ([place]) 07/06/2016  . Gait abnormality 05/13/2016  . Cerebrovascular accident (CVA) due to thrombosis of right anterior cerebral artery ([place]) 05/13/2016  . History of heart block 05/07/2016  . Benign labile hypertension 08/21/2015  . Acute CVA (cerebrovascular accident) ([place]) 08/04/2015  . Left hemiparesis ([place]) 08/04/2015  . Primary osteoarthritis of left knee 08/04/2015  . Esophageal reflux 08/04/2015  . Hyperlipidemia LDL goal <70 08/04/2015  . Paroxysmal atrial fibrillation ([place]) 08/04/2015  . PVD (peripheral vascular disease) ([place]) 08/04/2015  . Protein-calorie malnutrition ([place]) 08/04/2015     Disposition: ALF  Discharge Condition: stable/improved  Discharge Exam: Please see progress note from the day of discharge  Brief Hospital Course:  [name] presented to the hospital with an episode of decreased responsiveness at ALF and questionable  right sided facial droop, concerning for syncope vs TIA. Her PMH is significant for carotid stenosis (70% stenosis of left ICA), April 2017 R CVA with left sided weakness, HLD, HTN, paroxysmal A. Fib on Xarelto, prior complete heart block, PVD, protein calorie malnutrition and hearing impairment. This is her third visit to the ED since November 2017 for syncopal or near syncopal event.  On arrival, code stroke was initiated due to EMS stating she had right facial droop however it was cancelled as patient was neurologically intact with no acute defecits when presenting to ED. Head CT scan and Djordjevic MRI negative for acute stroke. EEG performed. Neurology stated patient may have had a seizure but would not recommend starting anti epileptics given the history of the syncopal event. There was a lesion found on MRI that was located on the left frontal calvarium which is about 2cm, concerning for possible malignancy. Patient did not want a workup at this time and discussed the risks and benefits with her.   She placed on telemetry, echocardiogram did not show any significant differences from her last one. Cardiology consulted due to hx of paroxysmal atrial fibrillation and complete heart block. Amiodarone was reduced to 100mg . She had been rate controlled while inpatient with no arrythmias. Orthostatic vitals were also positive.   During her time here at the hospital, her BP was very labile. With the recommendation of cardiology, her Cozaar was reduced to 50mg  QID. She was also placed on Hydralzine 10mg  PRN but this was not needed at discharge.   Due to hyperlipidemia and high ASCVD score, she was placed on Atorvastatin 40mg .   Upon discharge, patient remained at baseline neurological status. Episode in ALF likely from orthostatic hypotension vs arrhythmia.  Discussed patient with inpatient cardiologist Dr. Claiborne Billings prior to discharge who stated no barriers to  discharge and she could be seen in the outpatient  setting.   Issues for Follow Up:  1. Patient was started on atorvastatin 40 mg daily, please follow up that she continues to take this. Check LFTs in 1-2 months 2. Amiodarone was decreased from 200 mg daily to 100 mg daily. Please continue to monitor heart rate with this change. 3. Losartan 50 mg was prescribed at discharge, which is a decrease from home dose of 100 mg daily. Please monitor BP on this decreased dose and increase back to her normal dose as indicated. 4. Will need outpatient cardiology follow up with Dr. Gwenlyn Found within the next couple weeks  Significant Procedures: None  Significant Labs and Imaging:   Recent Labs Lab 10/18/16 1403 10/18/16 1413 10/19/16 0708 10/21/16 0425  WBC 6.4  --  6.4 6.5  HGB 12.6 13.6 12.6 11.9*  HCT 39.4 40.0 38.7 38.1  PLT 146*  --  166 151    Recent Labs Lab 10/18/16 1403 10/18/16 1413 10/19/16 0708 10/21/16 0425  NA 139 140 140 141  K 4.3 4.3 3.8 3.9  CL 106 104 106 109  CO2 23  --  25 25  GLUCOSE 113* 117* 91 91  BUN 22* 27* 21* 22*  CREATININE 1.23* 1.10* 1.00 0.97  CALCIUM 9.4  --  9.5 9.4  ALKPHOS 95  --   --   --   AST 34  --   --   --   ALT 25  --   --   --   ALBUMIN 3.8  --   --   --     Ct Angio Head W Or Wo Contrast 10/18/2016 IMPRESSION: CTA NECK:  1. Atherosclerosis resulting in short segments 50% RIGHT and 60% LEFT stenosis internal carotid artery origins.  2. Moderate luminal irregularity RIGHT vertebral artery seen with atherosclerosis and old dissection without flow limiting stenosis. CTA head: 1. No emergent large vessel occlusion.  2. Moderate intracranial atherosclerosis with superimposed scattered severe stenoses of anterior and posterior circulation.  Dg Chest 2 View 10/19/2016  IMPRESSION: No acute cardiopulmonary disease.   Ct Angio Neck W Or Wo Contrast: 10/18/2016 IMPRESSION: CTA NECK: 1. Atherosclerosis resulting in short segments 50% RIGHT and 60% LEFT stenosis internal carotid artery origins.  2.  Moderate luminal irregularity RIGHT vertebral artery seen with atherosclerosis and old dissection without flow limiting stenosis. CTA head:  1. No emergent large vessel occlusion.  2. Moderate intracranial atherosclerosis with superimposed scattered severe stenoses of anterior and posterior circulation.  Mr Zella Dewan Wo Contrast 10/18/2016 IMPRESSION: 1. No acute intracranial process on this motion degraded examination. 2. Old Large RIGHT MCA territory infarct. Old small cerebellar infarcts. 3. Moderate to severe chronic small vessel ischemic disease. 4. Enhancing LEFT frontal calvarial 2.1 cm lesion. Heterogeneous calvarial bone marrow signal. These are nonspecific, metastatic disease not excluded. Bone scan could be performed as clinically indicated though, may not be scintigraphically demonstrated. Electronically Signed   By: Elon Alas M.D.   On: 10/18/2016 22:13   Ct Head Code Stroke Wo ContrastResult Date: 10/18/2016 IMPRESSION: 1. Large remote right MCA territory nonhemorrhagic infarct is stable. 2. Remote ischemic changes involving the internal capsule, thalami, brainstem, and cerebellum are stable. 3. No acute intracranial abnormality. 4. Atherosclerosis. 5. Lucent lesions throughout the calvarium raising concern for metastatic disease or myeloma. 6. ASPECTS is 10/10 These results were called by telephone at the time of interpretation on 10/18/2016 at 2:29 pm to Dr. Leonel Ramsay, who verbally acknowledged these  results. Electronically Signed   By: San Morelle M.D.   On: 10/18/2016 14:30   Echocardiogram: EF is 65-70%, G1DD, normal LV, RV and RA size. She does have severe LA enlargement with mild aortic stenosis  Results/Tests Pending at Time of Discharge: None  Discharge Medications:  Allergies as of 10/21/2016   No Known Allergies     Medication List    STOP taking these medications   acetaminophen 500 MG tablet Commonly known as:  TYLENOL   BIOFREEZE 4 % Gel Generic  drug:  Menthol (Topical Analgesic)   hydrALAZINE 10 MG tablet Commonly known as:  APRESOLINE   lisinopril 40 MG tablet Commonly known as:  PRINIVIL,ZESTRIL     TAKE these medications   amiodarone 100 MG tablet Commonly known as:  PACERONE Take 1 tablet (100 mg total) by mouth daily. What changed:  medication strength  how much to take  how to take this  when to take this  additional instructions   apixaban 2.5 MG Tabs tablet Commonly known as:  ELIQUIS Take 1 tablet (2.5 mg total) by mouth 2 (two) times daily.   atorvastatin 40 MG tablet Commonly known as:  LIPITOR Take 1 tablet (40 mg total) by mouth daily at 6 PM.   gabapentin 100 MG capsule Commonly known as:  NEURONTIN Take 100-200 mg by mouth See admin instructions. Take 100 mg by mouth in the morning and take 200 mg by mouth at bedtime   losartan 50 MG tablet Commonly known as:  COZAAR Take 1 tablet (50 mg total) by mouth daily. What changed:  medication strength  how much to take   QUEtiapine 25 MG tablet Commonly known as:  SEROQUEL Take 25 mg by mouth daily.   sertraline 25 MG tablet Commonly known as:  ZOLOFT Take 25 mg by mouth daily.   traMADol 50 MG tablet Commonly known as:  ULTRAM Take 25 mg by mouth every 8 (eight) hours as needed for moderate pain.            Discharge Care Instructions        Start     Ordered   10/22/16 0000  amiodarone (PACERONE) 100 MG tablet  Daily     10/21/16 1527   10/22/16 0000  losartan (COZAAR) 50 MG tablet  Daily     10/21/16 1527   10/21/16 0000  atorvastatin (LIPITOR) 40 MG tablet  Daily-1800     10/21/16 1245   10/21/16 0000  Increase activity slowly     10/21/16 1527   10/21/16 0000  Diet - low sodium heart healthy     10/21/16 1527      Discharge Instructions: Please refer to Patient Instructions section of EMR for full details.  Patient was counseled important signs and symptoms that should prompt return to medical care, changes in  medications, dietary instructions, activity restrictions, and follow up appointments.   Follow-Up Appointments: Follow-up Information    [facility] Follow up.   Specialty:  Radiology Contact information: [address] [id number] [place] Follow up.   Specialty:  Cardiology Contact information: [address] [id number] Danice Goltz [place] [address] [phone]       Lorretta Harp, MD. Schedule an appointment as soon as possible for a visit.   Specialties:  Cardiology, Radiology Why:  Make appointment to be seen within the next 1-2 weeks Contact  information: [address] [place] [address] [phone]           Carlyle Dolly, MD 10/21/2016, 3:29 PM PGY-3, [place]

## 2016-10-21 NOTE — Progress Notes (Signed)
Discharge to: [facility] Anticipated discharge date: 10/21/16 Family notified: Yes, by phone Transportation by: Family Car  Report #: [phone], ask for [place] signing off.  Laveda Abbe LCSW [phone]

## 2016-10-21 NOTE — Care Management Note (Signed)
Case Management Note  Patient Details  Name: Virginia Meyer MRN: [id number] Date of Birth: 08-Dec-1928  Subjective/Objective:                    Action/Plan: Pt discharging back to [facility]F today. Pt with orders for [facility] services. Per CSW pt was already receiving [facility] services at [facility]. CM provided CSW with orders and face to face for resumption of services. CSW to fax the orders/ face to face to [facility]. No further needs per CM.  Expected Discharge Date:  10/21/16               Expected Discharge Plan:  Assisted Living / Rest Home  In-House Referral:  Clinical Social Work  Discharge planning Services     Post Acute Care Choice:  Home Health Choice offered to:     DME Arranged:    DME Agency:     HH Arranged:    [place] Agency:     Status of Service:  Completed, signed off  If discussed at [employer], dates discussed:    Additional Comments:  Pollie Friar, RN 10/21/2016, 3:54 PM

## 2016-10-21 NOTE — Progress Notes (Addendum)
Family Medicine Medical Student Note Daily Progress Note Intern Pager: [phone]  Patient name: Virginia Meyer Medical record number: [id number] Date of birth: 06/12/1928 Age: 83 y.o. Gender: female  Primary Care Provider: Reymundo Poll, MD Consultants: Neurology, cardiology, PT/OT Code Status: Partial  Pt Overview and Major Events to Date:  8/27: Admit to FPTS for TIA vs syncope work up  Assessment and Plan:  Mirtie Brainis a 83 y.o.femalepresenting with episode of decreased responsiveness and ?New right facial droop. PMH is significant for carotid stenosis (70% stenosis of left ICA), April 2017 RCVA with left-sided weakness, HLD, HTN, paroxysmal A. fib on Xarelto, PVD, protein calorie malnutrition, hearing impairment  Syncopal episode: Episode of decreased responsiveness and ?R sided facial droop on 8/27 at ALF but resolved upon presentation to [facility] ED. Leading differentials still includes syncope (cardiologic vs neurogenic cause) vs TIA. Workup negative for acute stroke and neuro signed off. Orthostatic vitals are positive for orthostasis.  - Telemetry - Echo ordered - Neuro checks q2 - PT recommend SNF, 24h assistance - cardiology consulted, appreciate recs  Hypertension:Blood pressure this AM 123/59. Also orthostatic vitals positive. Home antihypertensives include cozaar 100 mg and hydralazine 10 mg BID - Cardiology has titrated her Cozaar to 50mg  today  - cautious BP control given labile pressure  - Hydral PRN high pressures - Cardiology considering adding oral Hydralazine if there is a continued increase in BP  Hx of complete heart block and Paroxysmal A. Fib: Currently rate controlled. Hx of heart block possibly playing a role into possible syncopal episode that happened at ALF. -Continue eliquis --Amiodarone was reduced to 100mg  by cardiology -Cardiology consulted, appreciate their assistance - considering EP consult vs 30 day event monitor   HFpEF, G1DD:  currently euvolemic, systolic murmer on exam. August 28 Echo:EF65-70%, G1DD. Normal LV size. Normal RV size and systolic function. Severe left atrial enlargement. Normal RA size. Mild aortic stenosis.   Hx of R CVA: Residual left sided facial droop and decreased strength in LUE and LLE.  -Atorvastatin 40mg  today - Cardiology recommended to consider adding Zetia 10mg   Protein calorie malnutrition: On patient's problem list -Nutrition consult  Hyperlipidemia: Patient will begin statin medication. Lipid panel showing Cholesterol 281, LDL 218, and HDL39.  - Atorvastatin 40mg  - Consider adding Zetia 10mg  per cardiology  Mild AKI: Resolved - Daily BMP - oral rehydration   Lesion on [place] MRI: uncertain etiology, nonspecific on imaging. Lesion left frontal calvarium, ~ 2cm. ?malignancy. Patient does not want a workup at this time. Discussed this with her at length this AM. She is very clear on not wanting further workup.  Urinary incontinence: Wears adult diaper at home - Continue adult diaper and consider female condom cath while here  Wheelchair bound: Unable to safely ambulate at ALF. Uses wheelchair - PT/OT  Hx of Anxiety and MDD:  - Continue home Zoloft and Seroquel  Hx of OA and neuropathy - home tramadol and gabapentin - held tramadol and reduced gabapentin yesterday in setting of lower pressures, can add back as needed  FEN/GI: SLIV, heart healthy diet PPx: Home eliquis  Disposition: Likely back to ALF after medically stable  Subjective:  [name] denies having any acute events overnight. No new complaints this morning. Appears to be in good spirit today and did eat breakfast. Still has some itchiness of her arms around the IV sites.  Objective: Temp:  [98 F (36.7 C)-98.6 F (37 C)] 98.1 F (36.7 C) (08/30 0500) Pulse Rate:  [60-73] 70 (08/30 6433)  Resp:  [18-20] 18 (08/30 0500) BP: (123-238)/(51-70) 123/59 (08/30 3151) SpO2:  [96 %-99 %] 98 % (08/30  0500) Physical Exam: General: Elderly female laying in bed without any acute distress Cardiovascular: Regular, rate and rhythm, normal S1 and S2, 3/6 systolic murmur, without rubs or gallops Respiratory:Normal work of breathing, clear to auscultation bilaterally Abdomen: Soft, nondistended, no tenderness to palpation, normal bowel sounds Extremities: Normal movements and tone Neurologic: Alert, cranial nerves II-XII intact, motor/strength 5/5 RUE, 4/5 RLE, 4/5 LUE, 3/5 LLE  Laboratory:  Recent Labs Lab 10/18/16 1403 10/18/16 1413 10/19/16 0708 10/21/16 0425  WBC 6.4  --  6.4 6.5  HGB 12.6 13.6 12.6 11.9*  HCT 39.4 40.0 38.7 38.1  PLT 146*  --  166 151    Recent Labs Lab 10/18/16 1403 10/18/16 1413 10/19/16 0708 10/21/16 0425  NA 139 140 140 141  K 4.3 4.3 3.8 3.9  CL 106 104 106 109  CO2 23  --  25 25  BUN 22* 27* 21* 22*  CREATININE 1.23* 1.10* 1.00 0.97  CALCIUM 9.4  --  9.5 9.4  PROT 6.4*  --   --   --   BILITOT 0.5  --   --   --   ALKPHOS 95  --   --   --   ALT 25  --   --   --   AST 34  --   --   --   GLUCOSE 113* 117* 91 91     Imaging/Diagnostic Tests:  Ophelia Charter, MS4  RESIDENT ADDENDUM  I saw and evaluated the patient, performing the key elements of service. I developed the management plan that is described in the Medical Student's note, and I agree with the content, making changing as needed. My detailed findings are below.  Physical Exam:  BP (!) 157/49 (BP Location: Right Arm)   Pulse 71   Temp 98.2 F (36.8 C) (Oral)   Resp 20   Ht 5' (1.524 m)   Wt 117 lb 4.6 oz (53.2 kg)   SpO2 99%   BMI 22.91 kg/m   General Appearance:   Chronically-ill appearing, nontoxic, pleasant  Lungs:   Clear to auscultation bilaterally, respirations unlabored, nor rales, rhonchi or wheezes  Heart:   Regular rate and rhythm, S1 and S2 normal, no murmurs, rubs, or gallops; Peripheral pulses present and normal throughout; Brisk capillary refill.  Abdomen:    Soft, non-tender, bowel sounds present, no mass, or organomegaly  Neurologic:   Alert, no cranial nerve deficits, normal strength and tone in upper extremities bilaterally    Virginia Meyer 10/21/2016, 12:40 PM PGY-2, [place]

## 2016-10-21 NOTE — Progress Notes (Signed)
Pt being discharged per orders from MD. Pt returning to [facility]. RN called and gave report to [facility]. Pt and daughter educated on discharge instructions. Pt and daughter aware of transfer and daughter verbalized understanding of instructions. All questions and concerns were addressed. Pt's IV was removed. Pt exited hospital via wheelchair.

## 2016-10-22 ENCOUNTER — Ambulatory Visit: Payer: Medicare HMO | Admitting: Physical Therapy

## 2016-10-26 DIAGNOSIS — Z79899 Other long term (current) drug therapy: Secondary | ICD-10-CM | POA: Diagnosis not present

## 2016-10-26 DIAGNOSIS — D6489 Other specified anemias: Secondary | ICD-10-CM | POA: Diagnosis not present

## 2016-10-26 DIAGNOSIS — I5089 Other heart failure: Secondary | ICD-10-CM | POA: Diagnosis not present

## 2016-10-26 DIAGNOSIS — E784 Other hyperlipidemia: Secondary | ICD-10-CM | POA: Diagnosis not present

## 2016-10-26 DIAGNOSIS — G9009 Other idiopathic peripheral autonomic neuropathy: Secondary | ICD-10-CM | POA: Diagnosis not present

## 2016-10-26 DIAGNOSIS — L988 Other specified disorders of the skin and subcutaneous tissue: Secondary | ICD-10-CM | POA: Diagnosis not present

## 2016-10-26 DIAGNOSIS — R55 Syncope and collapse: Secondary | ICD-10-CM | POA: Diagnosis not present

## 2016-10-26 DIAGNOSIS — I638 Other cerebral infarction: Secondary | ICD-10-CM | POA: Diagnosis not present

## 2016-10-27 ENCOUNTER — Observation Stay (HOSPITAL_COMMUNITY)
Admission: EM | Admit: 2016-10-27 | Discharge: 2016-10-29 | Disposition: A | Discharge status: Skilled Nursing Facility | Payer: Medicare HMO | Attending: Family Medicine | Admitting: Family Medicine

## 2016-10-27 ENCOUNTER — Encounter (HOSPITAL_COMMUNITY): Payer: Self-pay | Admitting: *Deleted

## 2016-10-27 ENCOUNTER — Observation Stay (HOSPITAL_COMMUNITY): Payer: Medicare HMO

## 2016-10-27 ENCOUNTER — Emergency Department (HOSPITAL_COMMUNITY): Payer: Medicare HMO

## 2016-10-27 DIAGNOSIS — E785 Hyperlipidemia, unspecified: Secondary | ICD-10-CM | POA: Diagnosis not present

## 2016-10-27 DIAGNOSIS — I4892 Unspecified atrial flutter: Secondary | ICD-10-CM | POA: Diagnosis not present

## 2016-10-27 DIAGNOSIS — I480 Paroxysmal atrial fibrillation: Secondary | ICD-10-CM | POA: Insufficient documentation

## 2016-10-27 DIAGNOSIS — Z993 Dependence on wheelchair: Secondary | ICD-10-CM | POA: Insufficient documentation

## 2016-10-27 DIAGNOSIS — F419 Anxiety disorder, unspecified: Secondary | ICD-10-CM | POA: Insufficient documentation

## 2016-10-27 DIAGNOSIS — Z90710 Acquired absence of both cervix and uterus: Secondary | ICD-10-CM | POA: Insufficient documentation

## 2016-10-27 DIAGNOSIS — K219 Gastro-esophageal reflux disease without esophagitis: Secondary | ICD-10-CM | POA: Insufficient documentation

## 2016-10-27 DIAGNOSIS — I672 Cerebral atherosclerosis: Secondary | ICD-10-CM | POA: Insufficient documentation

## 2016-10-27 DIAGNOSIS — I110 Hypertensive heart disease with heart failure: Secondary | ICD-10-CM | POA: Diagnosis not present

## 2016-10-27 DIAGNOSIS — N179 Acute kidney failure, unspecified: Secondary | ICD-10-CM | POA: Insufficient documentation

## 2016-10-27 DIAGNOSIS — R55 Syncope and collapse: Secondary | ICD-10-CM | POA: Diagnosis not present

## 2016-10-27 DIAGNOSIS — I739 Peripheral vascular disease, unspecified: Secondary | ICD-10-CM | POA: Diagnosis not present

## 2016-10-27 DIAGNOSIS — G8929 Other chronic pain: Secondary | ICD-10-CM | POA: Insufficient documentation

## 2016-10-27 DIAGNOSIS — I6523 Occlusion and stenosis of bilateral carotid arteries: Secondary | ICD-10-CM | POA: Insufficient documentation

## 2016-10-27 DIAGNOSIS — R54 Age-related physical debility: Secondary | ICD-10-CM | POA: Diagnosis not present

## 2016-10-27 DIAGNOSIS — I442 Atrioventricular block, complete: Secondary | ICD-10-CM | POA: Insufficient documentation

## 2016-10-27 DIAGNOSIS — I160 Hypertensive urgency: Principal | ICD-10-CM | POA: Diagnosis present

## 2016-10-27 DIAGNOSIS — I69354 Hemiplegia and hemiparesis following cerebral infarction affecting left non-dominant side: Secondary | ICD-10-CM | POA: Diagnosis not present

## 2016-10-27 DIAGNOSIS — I5032 Chronic diastolic (congestive) heart failure: Secondary | ICD-10-CM | POA: Diagnosis not present

## 2016-10-27 DIAGNOSIS — I700 Atherosclerosis of aorta: Secondary | ICD-10-CM | POA: Insufficient documentation

## 2016-10-27 DIAGNOSIS — R4182 Altered mental status, unspecified: Secondary | ICD-10-CM | POA: Diagnosis present

## 2016-10-27 DIAGNOSIS — R4020 Unspecified coma: Secondary | ICD-10-CM | POA: Diagnosis not present

## 2016-10-27 DIAGNOSIS — Z79899 Other long term (current) drug therapy: Secondary | ICD-10-CM | POA: Insufficient documentation

## 2016-10-27 DIAGNOSIS — R0989 Other specified symptoms and signs involving the circulatory and respiratory systems: Secondary | ICD-10-CM

## 2016-10-27 DIAGNOSIS — R531 Weakness: Secondary | ICD-10-CM | POA: Diagnosis not present

## 2016-10-27 DIAGNOSIS — F329 Major depressive disorder, single episode, unspecified: Secondary | ICD-10-CM | POA: Insufficient documentation

## 2016-10-27 DIAGNOSIS — G9389 Other specified disorders of brain: Secondary | ICD-10-CM | POA: Diagnosis not present

## 2016-10-27 DIAGNOSIS — Z7901 Long term (current) use of anticoagulants: Secondary | ICD-10-CM | POA: Diagnosis not present

## 2016-10-27 DIAGNOSIS — M1712 Unilateral primary osteoarthritis, left knee: Secondary | ICD-10-CM | POA: Diagnosis not present

## 2016-10-27 DIAGNOSIS — R404 Transient alteration of awareness: Secondary | ICD-10-CM

## 2016-10-27 LAB — CBC WITH DIFFERENTIAL/PLATELET
BASOS PCT: 0 %
Basophils Absolute: 0 10*3/uL (ref 0.0–0.1)
EOS ABS: 0.1 10*3/uL (ref 0.0–0.7)
Eosinophils Relative: 1 %
HCT: 36.4 % (ref 36.0–46.0)
HEMOGLOBIN: 11.7 g/dL — AB (ref 12.0–15.0)
Lymphocytes Relative: 8 %
Lymphs Abs: 0.6 10*3/uL — ABNORMAL LOW (ref 0.7–4.0)
MCH: 29.6 pg (ref 26.0–34.0)
MCHC: 32.1 g/dL (ref 30.0–36.0)
MCV: 92.2 fL (ref 78.0–100.0)
MONOS PCT: 12 %
Monocytes Absolute: 0.9 10*3/uL (ref 0.1–1.0)
NEUTROS PCT: 79 %
Neutro Abs: 5.9 10*3/uL (ref 1.7–7.7)
PLATELETS: 147 10*3/uL — AB (ref 150–400)
RBC: 3.95 MIL/uL (ref 3.87–5.11)
RDW: 14.3 % (ref 11.5–15.5)
WBC: 7.4 10*3/uL (ref 4.0–10.5)

## 2016-10-27 LAB — COMPREHENSIVE METABOLIC PANEL
ALBUMIN: 3.4 g/dL — AB (ref 3.5–5.0)
ALK PHOS: 92 U/L (ref 38–126)
ALT: 22 U/L (ref 14–54)
ANION GAP: 10 (ref 5–15)
AST: 30 U/L (ref 15–41)
BILIRUBIN TOTAL: 0.4 mg/dL (ref 0.3–1.2)
BUN: 21 mg/dL — AB (ref 6–20)
CALCIUM: 9.1 mg/dL (ref 8.9–10.3)
CO2: 24 mmol/L (ref 22–32)
CREATININE: 1.24 mg/dL — AB (ref 0.44–1.00)
Chloride: 104 mmol/L (ref 101–111)
GFR calc Af Amer: 45 mL/min — ABNORMAL LOW (ref >60)
GFR calc non Af Amer: 38 mL/min — ABNORMAL LOW (ref >60)
GLUCOSE: 94 mg/dL (ref 65–99)
Potassium: 4.1 mmol/L (ref 3.5–5.1)
Sodium: 138 mmol/L (ref 135–145)
TOTAL PROTEIN: 6.1 g/dL — AB (ref 6.5–8.1)

## 2016-10-27 LAB — URINALYSIS, ROUTINE W REFLEX MICROSCOPIC
Bilirubin Urine: NEGATIVE
GLUCOSE, UA: NEGATIVE mg/dL
Hgb urine dipstick: NEGATIVE
Ketones, ur: NEGATIVE mg/dL
Leukocytes, UA: NEGATIVE
Nitrite: NEGATIVE
PH: 6 (ref 5.0–8.0)
PROTEIN: NEGATIVE mg/dL
Specific Gravity, Urine: 1.006 (ref 1.005–1.030)

## 2016-10-27 LAB — I-STAT TROPONIN, ED: Troponin i, poc: 0 ng/mL (ref 0.00–0.08)

## 2016-10-27 LAB — PROTIME-INR
INR: 1.15
PROTHROMBIN TIME: 14.6 s (ref 11.4–15.2)

## 2016-10-27 LAB — I-STAT CG4 LACTIC ACID, ED: LACTIC ACID, VENOUS: 1.5 mmol/L (ref 0.5–1.9)

## 2016-10-27 MED ORDER — HYDRALAZINE HCL 10 MG PO TABS
10.0000 mg | ORAL_TABLET | Freq: Once | ORAL | Status: AC
  Administered 2016-10-27: 10 mg via ORAL
  Filled 2016-10-27: qty 1

## 2016-10-27 MED ORDER — SODIUM CHLORIDE 0.9 % IV BOLUS (SEPSIS): 500.0000 mL | Freq: Once | INTRAVENOUS | Status: DC

## 2016-10-27 NOTE — ED Provider Notes (Signed)
Emergency Department Provider Note   I have reviewed the triage vital signs and the nursing notes.   HISTORY  Chief Complaint Altered Mental Status   HPI Virginia Meyer is a 83 y.o. female with PMH of HTN, HLD, GERD, PVD, and prior CVA with left sided deficit resents to the emergency department for evaluation after second episode of unresponsiveness. The patient denies feeling symptomatic this morning. She remembers feeling constipated and was getting help going to the bathroom but cannot recall the events afterwards. Denies chest pain, palpitations, shortness of breath. Denies any pain at this time. No vomiting or diarrhea. She was admitted last week for similar presentation and evaluated with ECHO and TIA/Syncope w/u. No fever or chills. Patient with labile BPs during last admission so her Cozaar was reduced to 50 mg QID, Amiodarone was decreased to 100 mg, and started on Hydralazine 10 mg PRN.    Past Medical History:  Diagnosis Date  . Anxiety   . Fall   . GERD (gastroesophageal reflux disease)   . Heart failure ([place])   . HLD (hyperlipidemia)   . HTN (hypertension)   . Hyperlipidemia   . Hypotension   . Major depressive disorder   . Neuropathy   . PVD (peripheral vascular disease) ([place])   . Stroke [facility])    left sided deficit  . UTI (urinary tract infection)     Patient Active Problem List   Diagnosis Date Noted  . Hypertensive urgency 10/27/2016  . Decreased responsiveness   . Bradycardia 10/19/2016  . Hypotension 10/19/2016  . Skull lesion 10/19/2016  . History of cerebrovascular accident (CVA) with residual deficit 10/19/2016  . Personal history of atrial flutter 10/19/2016  . Thoracic aortic atherosclerosis ([place]) 10/19/2016  . Intracranial atherosclerosis 10/19/2016  . Frailty syndrome in geriatric patient 10/19/2016  . Acute kidney injury ([place]) 10/19/2016  . 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 Syncope, Recurrent 10/18/2016  .  Carotid artery disease ([place]) 07/06/2016  . Gait abnormality 05/13/2016  . Cerebrovascular accident (CVA) due to thrombosis of right anterior cerebral artery ([place]) 05/13/2016  . History of heart block 05/07/2016  . Benign labile hypertension 08/21/2015  . Acute CVA (cerebrovascular accident) ([place]) 08/04/2015  . Left hemiparesis ([place]) 08/04/2015  . Primary osteoarthritis of left knee 08/04/2015  . Esophageal reflux 08/04/2015  . Hyperlipidemia LDL goal <70 08/04/2015  . Paroxysmal atrial fibrillation ([place]) 08/04/2015  . PVD (peripheral vascular disease) ([place]) 08/04/2015  . Protein-calorie malnutrition ([place]) 08/04/2015    Past Surgical History:  Procedure Laterality Date  . ABDOMINAL HYSTERECTOMY    . FOOT NEUROMA SURGERY    . SPINE SURGERY    . TONSILLECTOMY      Current Outpatient Rx  . Order #: [id number] Class: Print  . Order #: [id number] Class: Print  . Order #: [id number] Class: No Print  . Order #: [id number] Class: Historical Med  . Order #: [id number] Class: Historical Med  . Order #: [id number] Class: Print  . Order #: [id number] Class: Historical Med  . Order #: [id number] Class: Historical Med  . Order #: [id number] Class: Historical Med  . Order #: [id number] Class: Historical Med  . Order #: [id number] Class: Historical Med  . Order #: [id number] Class: Historical Med    Allergies Patient has no known allergies.  Family History  Problem Relation Age of Onset  . Heart attack Brother   . Stroke Neg Hx   . Neuropathy Neg Hx     Social History Social History  Substance Use Topics  . Smoking status:  Never Smoker  . Smokeless tobacco: Never Used  . Alcohol use No    Review of Systems  Constitutional: No fever/chills. Positive increased sleepiness.  Eyes: No visual changes. ENT: No sore throat. Cardiovascular: Denies chest pain. Respiratory: Denies shortness of breath. Gastrointestinal: No abdominal pain.  No nausea, no vomiting.  No diarrhea.  No  constipation. Genitourinary: Negative for dysuria. Musculoskeletal: Negative for back pain. Skin: Negative for rash. Neurological: Negative for headaches, focal weakness or numbness.  10-point ROS otherwise negative.  ____________________________________________   PHYSICAL EXAM:  VITAL SIGNS: ED Triage Vitals [10/27/16 1458]  Enc Vitals Group     BP (!) 122/37     Pulse Rate 60     Resp 16     Temp (!) 97.4 F (36.3 C)     Temp Source Oral     SpO2 98 %   Constitutional: Alert and oriented. Well appearing and in no acute distress. Eyes: Conjunctivae are normal. PERRL.  Head: Atraumatic. Nose: No congestion/rhinnorhea. Mouth/Throat: Mucous membranes are dry.  Neck: No stridor.   Cardiovascular: Normal rate, regular rhythm. Good peripheral circulation. Grossly normal heart sounds.   Respiratory: Normal respiratory effort.  No retractions. Lungs CTAB. Gastrointestinal: Soft and nontender. No distention.  Musculoskeletal: No lower extremity tenderness nor edema. No gross deformities of extremities. Neurologic:  Normal speech and language. No gross focal neurologic deficits are appreciated. Mild left side weakness (baseline). No CN deficit 2-12.  Skin:  Skin is warm, dry and intact. No rash noted.   ____________________________________________   LABS (all labs ordered are listed, but only abnormal results are displayed)  Labs Reviewed  COMPREHENSIVE METABOLIC PANEL - Abnormal; Notable for the following:       Result Value   BUN 21 (*)    Creatinine, Ser 1.24 (*)    Total Protein 6.1 (*)    Albumin 3.4 (*)    GFR calc non Af Amer 38 (*)    GFR calc Af Amer 45 (*)    All other components within normal limits  CBC WITH DIFFERENTIAL/PLATELET - Abnormal; Notable for the following:    Hemoglobin 11.7 (*)    Platelets 147 (*)    Lymphs Abs 0.6 (*)    All other components within normal limits  URINE CULTURE  PROTIME-INR  URINALYSIS, ROUTINE W REFLEX MICROSCOPIC  RAPID  URINE DRUG SCREEN, HOSP PERFORMED  I-STAT TROPONIN, ED  I-STAT CG4 LACTIC ACID, ED   ____________________________________________  EKG  EKG not crossing to MUSE  Rate: 59 PR: 202 QTc: 482  Sinus rhythm. Normal axis. Narrow QRS. No ST elevation or depression. No STEMI. Similar to prior.      ____________________________________________  DGLOVFIEP  Dg Chest 2 View  Result Date: 10/27/2016 CLINICAL DATA:  Weakness.  Syncope. EXAM: CHEST  2 VIEW COMPARISON:  10/19/2016 FINDINGS: Mild cardiomegaly. Negative aortic and hilar contours. Asymmetric hazy density at the medial right apex correlates with ectatic vessels by recent CTA. There is no edema, consolidation, effusion, or pneumothorax. No acute osseous finding. Exaggerated thoracic kyphosis. IMPRESSION: No evidence of active disease. Electronically Signed   By: Monte Fantasia M.D.   On: 10/27/2016 16:45   Ct Head Wo Contrast  Result Date: 10/27/2016 CLINICAL DATA:  Brief altered level consciousness, possibly sleeping. History of stroke and hypertension. EXAM: CT HEAD WITHOUT CONTRAST TECHNIQUE: Contiguous axial images were obtained from the base of the skull through the vertex without intravenous contrast. COMPARISON:  MRI of the head October 18, 2016 and CT HEAD  January 07, 2016 FINDINGS: [place]: No intraparenchymal hemorrhage, mass effect nor midline shift. RIGHT frontotemporal parietal encephalomalacia with ex vacuo dilatation RIGHT lateral ventricle. No hydrocephalus. Bold small cerebellar infarcts better seen on prior MRI. Confluent supratentorial white matter hypodensities. No acute large vascular territory infarct. No abnormal extra-axial fluid collections. Basal cisterns are patent. VASCULAR: Moderate to severe calcific atherosclerosis of the carotid siphons. SKULL: Mildly expansile permeative lesion LEFT frontal calvarium corresponding to prior enhancing lesion on MRI, relatively stable from prior CT. No skull fracture. Osteopenia.  Severe LEFT temporomandibular osteoarthrosis. No significant scalp soft tissue swelling. SINUSES/ORBITS: Trace paranasal sinus mucosal thickening. Mastoid air cells are well aerated. The included ocular globes and orbital contents are non-suspicious. Status post bilateral ocular lens implants. OTHER: None. IMPRESSION: 1. No acute intracranial process. 2. Old large RIGHT MCA territory infarct and old small cerebellar infarcts. 3. Moderate to severe chronic small vessel ischemic disease. 4. Expansile LEFT frontal calvarial lesion, relatively stable from 2017. Findings favor hemangioma, less likely metastatic disease. Electronically Signed   By: Elon Alas M.D.   On: 10/27/2016 22:49    ____________________________________________   PROCEDURES  Procedure(s) performed:   Procedures  None ____________________________________________   INITIAL IMPRESSION / ASSESSMENT AND PLAN / ED COURSE  Pertinent labs & imaging results that were available during my care of the patient were reviewed by me and considered in my medical decision making (see chart for details).  Patient resents to the emergency department for evaluation of increased sleepiness and possible syncope event. She is apparently hard to wake up at her SNF today. No reported fall or head trauma. No symptoms at this time. Patient with recent ECHO and admission last week for similar presentation. Patient A&O x 4. No neuro deficits. Plan for labs, UA, CXR, and reassess.   Patient labs are largely unremarkable. Patient BP rising rapidly. Seems more confused with elevated BP. Concern for HTN urgency. Given PO Hydralazine per discharge plan. Will discuss re-admission with Family Medicine team.   Discussed patient's case with Family Medicine team to request admission. Patient and family (if present) updated with plan. Care transferred to [facility] Medicine service.  I reviewed all nursing notes, vitals, pertinent old records, EKGs, labs,  imaging (as available).  ____________________________________________  FINAL CLINICAL IMPRESSION(S) / ED DIAGNOSES  Final diagnoses:  Transient alteration of awareness  Labile blood pressure     MEDICATIONS GIVEN DURING THIS VISIT:  Medications  sodium chloride 0.9 % bolus 500 mL (0 mLs Intravenous Hold 10/27/16 2043)  hydrALAZINE (APRESOLINE) tablet 10 mg (10 mg Oral Given 10/27/16 1809)     NEW OUTPATIENT MEDICATIONS STARTED DURING THIS VISIT:  None   Note:  This document was prepared using Dragon voice recognition software and may include unintentional dictation errors.  Nanda Quinton, MD Emergency Medicine    Long, [facility], MD 10/27/16 2300

## 2016-10-27 NOTE — ED Notes (Signed)
Pt wishes to take drink fluids.

## 2016-10-27 NOTE — ED Notes (Signed)
Virginia Meyer  [phone]

## 2016-10-27 NOTE — ED Notes (Signed)
Pt states she was drowsy when she became aware of staff trying to wake her. She is without complaints now.

## 2016-10-27 NOTE — ED Notes (Signed)
Patient transported to X-ray 

## 2016-10-27 NOTE — ED Triage Notes (Signed)
To ED via [place] from [facility] for eval of short moment of aloc- described as possibly sleeping. On EMS arrival pt was alert to her norm. Pt with reported TIA and hypotensive last week.

## 2016-10-27 NOTE — H&P (Signed)
[facility] Admission History and Physical Service Pager: [phone]  Patient name: Virginia Meyer Medical record number: [id number] Date of birth: 17-Oct-1928 Age: 83 y.o. Gender: female  Primary Care Provider: Reymundo Poll, MD Consultants: None Code Status: Full  Chief Complaint: Altered Mental Status  Assessment and Plan: Virginia Meyer is a 83 y.o. female presenting with syncopal episode at ALF . PMH is significant for previous R ACA stroke with L hemiparesis 07/2015, paroxysmal afib, benign labile hypertension, complete heart block, B/L carotid artery disease, recurrent syncope.   Possible syncopal episode: Differential diagnosis include cardiac (history of AVB. atrial fibrillation/flutter, and also without prodrome, although she reports feeling sleepy prior to the incident), orthostatic (wide pulse pressure and low DBP), vasovagal (happened after using the bathroom). Pt is wheelchair bound and will likely be difficult to obtain orthostatic vitals. No cardiopulmonary symptoms to think of ACS. History, exam and lab finding not suggestive for infectious etiology. No new focal neuro findings to suggest new CVA. CT head without new intracranial process. Patient is on multiple sedating medications including gabapentin, meclizine, tramadol and Seroquel which could contribute to her unresponsiveness at assisted living facility. She has pinpoint pupils  minimally responsive to light bilaterally. However, she is very alert and oriented x4, able to recall her daughter's phone number from her head. Another possibility is seizure on tramadol. However, she doesn't look postictal.  -Admit to telemetry for observation. Attending Dr. Nori Riis - UDS - EKG and troponin 1 - Hold sedating medication except gabapentin - Avoid tramadol - Hypertensive urgency as below - Hold losartan in the setting of AKI  Hypertensive urgency: BP 122/37 on arrival to ED and then trended up to 214/53. No  signs of end organ damage except mild AKI. She is on hydralazine 10 mg and losartan 50 mg at home.  - Hold home losartan in the setting of AKI - hydralazine 10mg  PRN IV to SBP <180, DBP <100 - Consider amlodipine - telemetry  Paroxysmal atrial fibrillation not in RVR: On amiodarone for rate and rhythm control and Eliquis for anticoagulation. Not a candidate for nodal blocking agent due to history of AV block.  Mali Vasc score  6 and HAS BLED score 4. A 12.5% risk of stroke/embolism over the next 10 years without therapy. This could be lowered to 4.1% on Apixiban but with annual major bleeding risk of 4.4%. CT head without intracranial bleeding -We will continue home amiodarone and Apixiban for now -This may need further discussion in the morning  Old CVA with residual left hemiparesis: Stable. Patient had stroke workup at recent admission including MRI Vari, carotid Doppler and echocardiogram. She had 75-64% LICA stenosis on carotid Doppler but old large right MCA infarct.  -Continue home atorvastatin  Skull lesion:  nonspecific left frontal calvarium lesion ~ 2cm. ?malignancy on recent MRI [place].  -Per discharge summary, they did not want to pursue further workup on this at last hospitalization  Acute kidney injury: Serum creatinine 1.24 on admission. Baseline 0.9-1.0. Status post 500 mL normal saline bolus in ED -Hold home losartan -Monitor BMP  History of anxiety/depression -Continue Zoloft in the morning -Discussed about [place] given side effect  Chronic pain:  -Continue gabapentin -We'll be cautious about tramadol  Patient from assisted living facility:  -Education officer, museum consulted  FEN/GI:  -Heart healthy diet  Prophylaxis: On Eliquis for atrial fibrillation  Disposition: Admit to observation  History of Present Illness:  Virginia Meyer is a 83 y.o. female presenting with episode of unresponsiveness  at ALF.  Patient reports she was playing a game with the activity director  at her ALF when she bedcame sleepy and needed to use the restroom.  She was wheeled to the bathroom in a wheelchair and then stood up to use the bathroom with no difficulty. She stated she used the bathroom as normal with no burning or pain with urination. She does not remember what happened next but woke up to being loaded by EMS.  She denies any prodromal symptoms such as lightheadedness, dizziness, vision changes, numbness or tingling.  She denies fever, chest pain, SOB, nausea, vomiting, diarrhea, but does endorse constipation.  She does report a slight headache after she arrived at the ED.  She denies any events, symptoms, or recent falls since her last admission last week (see below). She denies any medication changes and states she has been taking her medication as prescribed since last admission.  In the ED, she remained alert and oriented x4 with no neuro deficits. CBC, PT/INR, CXR, U/A, troponin all within normal limits. CMP indicative of slight AKI with Cr of 1.24, elevated from last Cr during previous admission of 0.97. While in the ED, patient became hypertensive up to 214/53 with pulse of 59.  She received 10mg  Hydralazine with subsequent decrease in BP.  She has had 5 ED visits since November 2017 for similar episodes and was hospitalized recently from 10/18/16 - 10/21/16 for a similar syncopal episode and worked up for possible TIA. Neurological work up including EEG, head CT, and [place] MRI was negative apart from a 2cm lesion in the L frontal calvarium concerning for possible malignancy but patient refused further workup.  Cardiac workup at that time included telemetry, EKG that was unchanged.  Of note, her Amiodarone and Cozaar were both decreased during that admission and could contribute to rising BP in the ED.   Review Of Systems: Per HPI with the following additions:  Review of Systems  Constitutional: Negative for chills and fever.  HENT: Negative for hearing loss and sore throat.    Eyes: Negative for blurred vision and double vision.  Respiratory: Negative for cough and shortness of breath.   Cardiovascular: Negative for chest pain, palpitations and leg swelling.  Gastrointestinal: Negative for abdominal pain and vomiting.  Genitourinary: Negative for dysuria and hematuria.  Musculoskeletal: Negative for myalgias.  Skin: Negative for rash.  Neurological: Positive for headaches. Negative for tingling, speech change and seizures.       Mild headache  Psychiatric/Behavioral: Negative for memory loss.    Patient Active Problem List   Diagnosis Date Noted  . Decreased responsiveness   . Bradycardia 10/19/2016  . Hypotension 10/19/2016  . Skull lesion 10/19/2016  . History of cerebrovascular accident (CVA) with residual deficit 10/19/2016  . Personal history of atrial flutter 10/19/2016  . Thoracic aortic atherosclerosis ([place]) 10/19/2016  . Intracranial atherosclerosis 10/19/2016  . Frailty syndrome in geriatric patient 10/19/2016  . Acute kidney injury ([place]) 10/19/2016  . 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 Syncope, Recurrent 10/18/2016  . Carotid artery disease ([place]) 07/06/2016  . Gait abnormality 05/13/2016  . Cerebrovascular accident (CVA) due to thrombosis of right anterior cerebral artery ([place]) 05/13/2016  . History of heart block 05/07/2016  . Benign labile hypertension 08/21/2015  . Acute CVA (cerebrovascular accident) ([place]) 08/04/2015  . Left hemiparesis ([place]) 08/04/2015  . Primary osteoarthritis of left knee 08/04/2015  . Esophageal reflux 08/04/2015  . Hyperlipidemia LDL goal <70 08/04/2015  . Paroxysmal atrial  fibrillation ([place]) 08/04/2015  . PVD (peripheral vascular disease) ([place]) 08/04/2015  . Protein-calorie malnutrition ([place]) 08/04/2015    Past Medical History: Past Medical History:  Diagnosis Date  . Anxiety   . Fall   . GERD (gastroesophageal reflux disease)   . Heart failure ([place])   . HLD  (hyperlipidemia)   . HTN (hypertension)   . Hyperlipidemia   . Hypotension   . Major depressive disorder   . Neuropathy   . PVD (peripheral vascular disease) ([place])   . Stroke [facility])    left sided deficit  . UTI (urinary tract infection)     Past Surgical History: Past Surgical History:  Procedure Laterality Date  . ABDOMINAL HYSTERECTOMY    . FOOT NEUROMA SURGERY    . SPINE SURGERY    . TONSILLECTOMY      Social History: Social History  Substance Use Topics  . Smoking status: Never Smoker  . Smokeless tobacco: Never Used  . Alcohol use No   Additional social history: resides at ALF, ok to call daughter  Please also refer to relevant sections of EMR.  Family History: Family History  Problem Relation Age of Onset  . Heart attack Brother   . Stroke Neg Hx   . Neuropathy Neg Hx     Allergies and Medications: No Known Allergies No current facility-administered medications on file prior to encounter.    Current Outpatient Prescriptions on File Prior to Encounter  Medication Sig Dispense Refill  . amiodarone (PACERONE) 100 MG tablet Take 1 tablet (100 mg total) by mouth daily. 30 tablet 0  . apixaban (ELIQUIS) 2.5 MG TABS tablet Take 1 tablet (2.5 mg total) by mouth 2 (two) times daily. 60 tablet 2  . atorvastatin (LIPITOR) 40 MG tablet Take 1 tablet (40 mg total) by mouth daily at 6 PM. 30 tablet 0  . gabapentin (NEURONTIN) 100 MG capsule Take 100-200 mg by mouth See admin instructions. 100 mg in the morning and 200 mg at bedtime    . losartan (COZAAR) 50 MG tablet Take 1 tablet (50 mg total) by mouth daily. 30 tablet 0  . QUEtiapine (SEROQUEL) 25 MG tablet Take 25 mg by mouth daily.    . sertraline (ZOLOFT) 25 MG tablet Take 25 mg by mouth daily.    . traMADol (ULTRAM) 50 MG tablet Take 25 mg by mouth every 8 (eight) hours as needed (for pain).       Objective: BP (!) 189/87   Pulse 63   Temp (!) 97.4 F (36.3 C) (Oral)   Resp 15   SpO2 98%  Exam: General:  alert, in no distress. Able to sit up in bed for exam with assistance Eyes: PERRL, EOMI, pinpoint pupils bilaterally ENTM: mucous membranes moist Neck: no bruits appreciated Cardiovascular: RRR, 2/6 SEM over RUSB and LUSB no murmurs/rubs/gallops Respiratory: CTA bilaterally Gastrointestinal: soft, slightly tender to palpation in LLQ. Normoactive BS present. Not distended. No CVA tenderness. Ext: warm and well perfused. Faint dorsal pedal pulses. Neuro: alert and oriented to person, place, time, purpose. Psych: mood and affect euthymic.  Labs and Imaging: CBC BMET   Recent Labs Lab 10/27/16 1528  WBC 7.4  HGB 11.7*  HCT 36.4  PLT 147*    Recent Labs Lab 10/27/16 1528  NA 138  K 4.1  CL 104  CO2 24  BUN 21*  CREATININE 1.24*  GLUCOSE 94  CALCIUM 9.1      Rory Percy, DO 10/27/2016, 8:16 PM PGY-1, [place]  [facility] FPTS Intern pager: [phone], text pages welcome  I have seen and evaluated the patient with Dr. Ky Barban. I am in agreement with the note above in its revised form. My additions are in red.  Wendee Beavers, MD, PGY-3 10/28/2016 12:41 AM

## 2016-10-28 ENCOUNTER — Ambulatory Visit: Payer: Medicare HMO | Admitting: Physical Therapy

## 2016-10-28 DIAGNOSIS — R001 Bradycardia, unspecified: Secondary | ICD-10-CM | POA: Diagnosis not present

## 2016-10-28 DIAGNOSIS — R0989 Other specified symptoms and signs involving the circulatory and respiratory systems: Secondary | ICD-10-CM

## 2016-10-28 DIAGNOSIS — E784 Other hyperlipidemia: Secondary | ICD-10-CM | POA: Diagnosis not present

## 2016-10-28 DIAGNOSIS — R55 Syncope and collapse: Secondary | ICD-10-CM | POA: Diagnosis not present

## 2016-10-28 DIAGNOSIS — N179 Acute kidney failure, unspecified: Secondary | ICD-10-CM | POA: Diagnosis not present

## 2016-10-28 DIAGNOSIS — I2510 Atherosclerotic heart disease of native coronary artery without angina pectoris: Secondary | ICD-10-CM | POA: Diagnosis not present

## 2016-10-28 DIAGNOSIS — R404 Transient alteration of awareness: Secondary | ICD-10-CM

## 2016-10-28 DIAGNOSIS — I480 Paroxysmal atrial fibrillation: Secondary | ICD-10-CM | POA: Diagnosis not present

## 2016-10-28 LAB — RAPID URINE DRUG SCREEN, HOSP PERFORMED
Amphetamines: NOT DETECTED
Barbiturates: NOT DETECTED
Benzodiazepines: NOT DETECTED
Cocaine: NOT DETECTED
Opiates: NOT DETECTED
Tetrahydrocannabinol: NOT DETECTED

## 2016-10-28 LAB — CBC
HCT: 36 % (ref 36.0–46.0)
HEMOGLOBIN: 11.6 g/dL — AB (ref 12.0–15.0)
MCH: 29.4 pg (ref 26.0–34.0)
MCHC: 32.2 g/dL (ref 30.0–36.0)
MCV: 91.4 fL (ref 78.0–100.0)
Platelets: 146 10*3/uL — ABNORMAL LOW (ref 150–400)
RBC: 3.94 MIL/uL (ref 3.87–5.11)
RDW: 14.2 % (ref 11.5–15.5)
WBC: 10.8 10*3/uL — AB (ref 4.0–10.5)

## 2016-10-28 LAB — URINE CULTURE: Special Requests: NORMAL

## 2016-10-28 LAB — BASIC METABOLIC PANEL
Anion gap: 7 (ref 5–15)
BUN: 17 mg/dL (ref 6–20)
CO2: 25 mmol/L (ref 22–32)
Calcium: 9.1 mg/dL (ref 8.9–10.3)
Chloride: 109 mmol/L (ref 101–111)
Creatinine, Ser: 0.96 mg/dL (ref 0.44–1.00)
GFR calc Af Amer: >60 mL/min (ref >60)
GFR calc non Af Amer: 52 mL/min — ABNORMAL LOW (ref >60)
Glucose, Bld: 88 mg/dL (ref 65–99)
Potassium: 3.9 mmol/L (ref 3.5–5.1)
Sodium: 141 mmol/L (ref 135–145)

## 2016-10-28 LAB — TROPONIN I

## 2016-10-28 MED ORDER — HYDRALAZINE HCL 20 MG/ML IJ SOLN
10.0000 mg | INTRAMUSCULAR | Status: DC | PRN
  Administered 2016-10-28 – 2016-10-29 (×3): 10 mg via INTRAVENOUS
  Filled 2016-10-28 (×3): qty 1

## 2016-10-28 MED ORDER — ATORVASTATIN CALCIUM 40 MG PO TABS
40.0000 mg | ORAL_TABLET | Freq: Every day | ORAL | Status: DC
  Administered 2016-10-28 – 2016-10-29 (×2): 40 mg via ORAL
  Filled 2016-10-28 (×2): qty 1

## 2016-10-28 MED ORDER — APIXABAN 2.5 MG PO TABS
2.5000 mg | ORAL_TABLET | Freq: Two times a day (BID) | ORAL | Status: DC
  Administered 2016-10-28 – 2016-10-29 (×4): 2.5 mg via ORAL
  Filled 2016-10-28 (×5): qty 1

## 2016-10-28 MED ORDER — AMIODARONE HCL 100 MG PO TABS
100.0000 mg | ORAL_TABLET | Freq: Every day | ORAL | Status: DC
  Administered 2016-10-28 – 2016-10-29 (×2): 100 mg via ORAL
  Filled 2016-10-28 (×3): qty 1

## 2016-10-28 MED ORDER — GABAPENTIN 100 MG PO CAPS
100.0000 mg | ORAL_CAPSULE | Freq: Two times a day (BID) | ORAL | Status: DC
  Administered 2016-10-28 – 2016-10-29 (×3): 100 mg via ORAL
  Filled 2016-10-28 (×3): qty 1

## 2016-10-28 NOTE — Progress Notes (Signed)
FPTS Interim Progress Note  Patient and daughter called asking to go home. Dr. Garlan Fillers counseled them that we would like to observe her further due to syncope, but we offered to see if it was possible to get her back to [place] at their request. CSW called and [facility] does not have staffing tonight to accept the patient. The patient will have to be discharged in the morning. Called daughter to let her know this, but phone rang many times and ultimately hung up without VM.   Sela Hilding, MD 10/28/2016, 5:40 PM PGY-2, [place] Medicine Service pager [phone]

## 2016-10-28 NOTE — Consult Note (Addendum)
Cardiology Consultation:   Patient ID: Virginia Meyer; [id number]; 05-09-1928   Admit date: 10/27/2016 Date of Consult: 10/28/2016  Primary Care Provider: Reymundo Poll, MD Primary Cardiologist: Dr. Gwenlyn Found Primary Electrophysiologist:     Patient Profile:   Virginia Meyer is a 83 y.o. female with a hx of paroxysmal Afib on amiodarone and eliquis, hx of complete heart block, HLD, HTN and labile pressures, carotid artery disease, prior CVAs with left-sided weakness, chronic diastolic heart failure who is being seen today for the evaluation of possible syncope at the request of Dr. Nori Riis.  History of Present Illness:   Virginia Meyer is known to this service and last saw Dr. Gwenlyn Found in clinic on 07/06/16. At that time, she was in her usual state of health and no medication changes were made.   She has had several hospitalizations for syncope with the most recent 10/18/16-10/21/16. At that time, she was brought to [facility] with concern for new stroke vs syncope. Imaging was negative for acute stroke. EEG with neurology stated patient may have had a seizure but did not recommend starting antiepileptic. Imaging did reveal lesion in the left frontal calvarium concerning for malignancy; however, she refused further workup at that time. Telemetry and echocardiogram were unchanged. Cardiology was consulted for hx of PAF and complete heart block. Amiodarone was reduced to 100 mg daily. Losartan was also reduced at that hospitalization given her labile BP. She was given 10 mg PO hydralazine to use as needed for HTN.  On 05/17/16 she was brought to ED after striking her head while leaning too far while on the commode. She did not have a LOC. No further workup was completed and she was discharged back to her facility.  She presented to [facility] and hospitalized on 05/07/16-05/10/16 for complete heart block and syncopal episode. She had episodes of atrial flutter treated with amiodarone and eliquis. AV nodal blocking agents should be  avoided in this patient. No PPM needed at that time.   On my interview, she states that she was participating in a game at her facility at a table. She had been constipated the day before and received laxatives. She informed staff that she needed to use the restroom and was taken to the bathroom. There, staff stated that she became drowsy. She reports to me that she never lost consciousness. She remembers the entire episode and did not want to come to the hospital. She denies chest pain, palpitations, dizziness, SOB, feelings of syncope, abdominal complaints, and blood in urine or stool. She states that she is [facility] and that its possible the staff member thought she was unresponsive because the staff member wasn't talking loudly enough.   Telemetry with NSR. EKG unchanged with evidence of old anterior infarct. Troponin x 2 negative. UA negative for UTI. Electrolytes WNL.   Past Medical History:  Diagnosis Date  . Anxiety   . Fall   . GERD (gastroesophageal reflux disease)   . Heart failure ([place])   . HLD (hyperlipidemia)   . HTN (hypertension)   . Hyperlipidemia   . Hypotension   . Major depressive disorder   . Neuropathy   . PVD (peripheral vascular disease) ([place])   . Stroke [facility])    left sided deficit  . UTI (urinary tract infection)     Past Surgical History:  Procedure Laterality Date  . ABDOMINAL HYSTERECTOMY    . FOOT NEUROMA SURGERY    . SPINE SURGERY    . TONSILLECTOMY  Home Medications:  Prior to Admission medications   Medication Sig Start Date End Date Taking? Authorizing Provider  amiodarone (PACERONE) 100 MG tablet Take 1 tablet (100 mg total) by mouth daily. 10/22/16  Yes Carlyle Dolly, MD  apixaban (ELIQUIS) 2.5 MG TABS tablet Take 1 tablet (2.5 mg total) by mouth 2 (two) times daily. 10/16/15  Yes Rosalin Hawking, MD  atorvastatin (LIPITOR) 40 MG tablet Take 1 tablet (40 mg total) by mouth daily at 6 PM. 10/21/16  Yes Everrett Coombe, MD  gabapentin (NEURONTIN)  100 MG capsule Take 100-200 mg by mouth See admin instructions. 100 mg in the morning and 200 mg at bedtime 10/02/15  Yes [provider]  hydrALAZINE (APRESOLINE) 10 MG tablet Take 10 mg by mouth 2 (two) times daily.   Yes [provider]  losartan (COZAAR) 50 MG tablet Take 1 tablet (50 mg total) by mouth daily. 10/22/16  Yes Carlyle Dolly, MD  meclizine (ANTIVERT) 12.5 MG tablet Take 12.5 mg by mouth every 8 (eight) hours as needed for dizziness. Not to exceed 3 doses/24 hours   Yes [provider]  Menthol, Topical Analgesic, (BIOFREEZE) 4 % GEL Apply 1 application topically See admin instructions. FOUR TIMES A DAY FOR BILATERAL KNEE PAIN   Yes [provider]  polyethylene glycol (MIRALAX / GLYCOLAX) packet Take 17 g by mouth daily as needed for mild constipation. To be mixed with 6 ounces of fluid   Yes [provider]  QUEtiapine (SEROQUEL) 25 MG tablet Take 25 mg by mouth daily.   Yes [provider]  sertraline (ZOLOFT) 25 MG tablet Take 25 mg by mouth daily.   Yes [provider]  traMADol (ULTRAM) 50 MG tablet Take 25 mg by mouth every 8 (eight) hours as needed (for pain).    Yes [provider]    Inpatient Medications: Scheduled Meds: . amiodarone  100 mg Oral Daily  . apixaban  2.5 mg Oral BID  . atorvastatin  40 mg Oral q1800  . gabapentin  100 mg Oral BID   Continuous Infusions:  PRN Meds: hydrALAZINE  Allergies:   No Known Allergies  Social History:   Social History   Social History  . Marital status: Widowed    Spouse name: N/A  . Number of children: 4  . Years of education: 12   Occupational History  . Retired    Social History Main Topics  . Smoking status: Never Smoker  . Smokeless tobacco: Never Used  . Alcohol use No  . Drug use: No  . Sexual activity: Not on file   Other Topics Concern  . Not on file   Social History Narrative   Lives at [facility]   Caffeine use:  1 cup tea/day   1 cup coffee/day    Family History:    Family History  Problem Relation Age of Onset  . Heart attack Brother   . Stroke Neg Hx   . Neuropathy Neg Hx      ROS:  Please see the history of present illness.  ROS  All other ROS reviewed and negative.     Physical Exam/Data:   Vitals:   10/28/16 0800 10/28/16 0830 10/28/16 0900 10/28/16 0953  BP: (!) 140/94 133/62 (!) 127/98 (!) 167/52  Pulse: 82   75  Resp:    18  Temp:    99.4 F (37.4 C)  TempSrc:    Oral  SpO2: 97%   97%  Weight:  117 lb 8.1 oz (53.3 kg)  Height:    5' (1.524 m)   No intake or output data in the 24 hours ending 10/28/16 1327 Filed Weights   10/28/16 0953  Weight: 117 lb 8.1 oz (53.3 kg)   Body mass index is 22.95 kg/m.  General:  Well nourished, well developed, in no acute distress HEENT: normal Lymph: no adenopathy Neck: no JVD Endocrine:  No thryomegaly Vascular: Carotid bruits difficult to assess with her loud systolic murmur Cardiac:  normal S1, S2; RRR; 4/6 systolic murmur Lungs:  clear to auscultation bilaterally, no wheezing, rhonchi or rales  Abd: soft, nontender, no hepatomegaly  Ext: no edema Musculoskeletal:  No deformities, Left-sided weakness Skin: warm and dry  Neuro:  Left facial droop and left-sided weakness from prior stroke Psych:  Normal affect   EKG:  The EKG was personally reviewed and demonstrates:  NSR, unchanged from previous, old anterior infarct Telemetry:  Telemetry was personally reviewed and demonstrates:  NSR  Relevant CV Studies:  Echocardiogram 10/19/16: Study Conclusions - Left ventricle: The cavity size was normal. Wall thickness was   increased in a pattern of moderate LVH. Systolic function was   vigorous. The estimated ejection fraction was in the range of 65%   to 70%. There was dynamic obstruction. Mid cavitary gradient   3.70m/s, peak gradient 35mmHg. Wall motion was normal; there were   no regional wall motion abnormalities.  Doppler parameters are   consistent with abnormal left ventricular relaxation (grade 1   diastolic dysfunction). - Aortic valve: Trileaflet; moderately thickened, moderately   calcified leaflets. There was mild stenosis. There was mild   regurgitation. Peak velocity (S): 279 cm/s. Mean gradient (S): 16   mm Hg. Valve area (VTI): 1.29 cm^2. Valve area (Vmax): 1.23 cm^2.   Valve area (Vmean): 1.34 cm^2. - Mitral valve: Moderately calcified annulus. There was trivial   regurgitation. - Left atrium: The atrium was severely dilated. - Pulmonary arteries: Systolic pressure was mildly increased. PA   peak pressure: 33 mm Hg (S).  Impressions: - Compared to the prior study, there has been no significant   interval change.   Carotid duplex 07/06/16: Heterogeneous plaque, bilaterally. 1-39% RICA stenosis. 18-29% LICA stenosis, based on peak systole, spectral broadening, and plaque. >50% bilateral ECA stenosis. Normal subclavian arteries, bilaterally. Patent vertebral arteries with antegrade flow    Laboratory Data:  Chemistry Recent Labs Lab 10/27/16 1528 10/28/16 0338  NA 138 141  K 4.1 3.9  CL 104 109  CO2 24 25  GLUCOSE 94 88  BUN 21* 17  CREATININE 1.24* 0.96  CALCIUM 9.1 9.1  GFRNONAA 38* 52*  GFRAA 45* >60  ANIONGAP 10 7     Recent Labs Lab 10/27/16 1528  PROT 6.1*  ALBUMIN 3.4*  AST 30  ALT 22  ALKPHOS 92  BILITOT 0.4   Hematology Recent Labs Lab 10/27/16 1528 10/28/16 0338  WBC 7.4 10.8*  RBC 3.95 3.94  HGB 11.7* 11.6*  HCT 36.4 36.0  MCV 92.2 91.4  MCH 29.6 29.4  MCHC 32.1 32.2  RDW 14.3 14.2  PLT 147* 146*   Cardiac Enzymes Recent Labs Lab 10/28/16 0338  TROPONINI <0.03    Recent Labs Lab 10/27/16 1540  TROPIPOC 0.00    BNPNo results for input(s): BNP, PROBNP in the last 168 hours.  DDimer No results for input(s): DDIMER in the last 168 hours.  Radiology/Studies:  Dg Chest 2 View  Result Date: 10/27/2016 CLINICAL DATA:   Weakness.  Syncope. EXAM: CHEST  2 VIEW COMPARISON:  10/19/2016 FINDINGS: Mild cardiomegaly. Negative aortic and hilar contours. Asymmetric hazy density at the medial right apex correlates with ectatic vessels by recent CTA. There is no edema, consolidation, effusion, or pneumothorax. No acute osseous finding. Exaggerated thoracic kyphosis. IMPRESSION: No evidence of active disease. Electronically Signed   By: Monte Fantasia M.D.   On: 10/27/2016 16:45   Ct Head Wo Contrast  Result Date: 10/27/2016 CLINICAL DATA:  Brief altered level consciousness, possibly sleeping. History of stroke and hypertension. EXAM: CT HEAD WITHOUT CONTRAST TECHNIQUE: Contiguous axial images were obtained from the base of the skull through the vertex without intravenous contrast. COMPARISON:  MRI of the head October 18, 2016 and CT HEAD January 07, 2016 FINDINGS: Mia: No intraparenchymal hemorrhage, mass effect nor midline shift. RIGHT frontotemporal parietal encephalomalacia with ex vacuo dilatation RIGHT lateral ventricle. No hydrocephalus. Bold small cerebellar infarcts better seen on prior MRI. Confluent supratentorial white matter hypodensities. No acute large vascular territory infarct. No abnormal extra-axial fluid collections. Basal cisterns are patent. VASCULAR: Moderate to severe calcific atherosclerosis of the carotid siphons. SKULL: Mildly expansile permeative lesion LEFT frontal calvarium corresponding to prior enhancing lesion on MRI, relatively stable from prior CT. No skull fracture. Osteopenia. Severe LEFT temporomandibular osteoarthrosis. No significant scalp soft tissue swelling. SINUSES/ORBITS: Trace paranasal sinus mucosal thickening. Mastoid air cells are well aerated. The included ocular globes and orbital contents are non-suspicious. Status post bilateral ocular lens implants. OTHER: None. IMPRESSION: 1. No acute intracranial process. 2. Old large RIGHT MCA territory infarct and old small cerebellar infarcts.  3. Moderate to severe chronic small vessel ischemic disease. 4. Expansile LEFT frontal calvarial lesion, relatively stable from 2017. Findings favor hemangioma, less likely metastatic disease. Electronically Signed   By: Elon Alas M.D.   On: 10/27/2016 22:49    Assessment and Plan:   1. Syncope, possible LOC, increased drowsiness It is unclear if the pt lost consciousness at her facility. She states that she remembers everything about the event in question. She denies being drowsy. Telemetry with NSR, EKG unchanged from prior. She is not experiencing bleeding on eliquis and is maintaining normal sinus rhythm on 100 mg amiodarone daily. She has a recent echo unchanged from previous. This episode of possible drowsiness does not sound cardiac in nature. No further cardiac workup warranted at this time.    2. Afib, hx of CHB - on amiodarone and eliquis, no bleeding - telemetry with NSR   3. Chronic diastolic heart failure - pt euvolemic on exam   4. HTN - pt has been doing well with PRN 10 mg hydralazine - she requires a wheelchair, orthostatics difficult to obtain     Signed, Ledora Bottcher, PA  10/28/2016 1:27 PM   Personally seen and examined. Agree with above.  83 year old with possible syncope, history of atrial flutter on low dose amiodarone with left ventricular outflow tract obstruction.  She is sure that she did not pass out. She took a "cat nap" and is hard of hearing and did not respond.   Exam: alert, RRR, 2/6 SM, lungs clear, neuro intact, MAE   - telemetry is benign, NSR  - ECG unremarkable  Syncope?  - she may have sleeping.  - no further cardiac workup needed. Prior ECHO reviewed.  - Suggest continued hydration to prevent increase in outflow tract obstruction.   - Reassuring tele  Parox atrial flutter  - continue low dose amio.    Please let  us know if we can be of further assistance  Candee Furbish, MD

## 2016-10-28 NOTE — ED Notes (Signed)
Attempted report 

## 2016-10-28 NOTE — Progress Notes (Signed)
Family Medicine Teaching Service Daily Progress Note Intern Pager: [phone]  Patient name: Virginia Meyer Medical record number: [id number] Date of birth: 1928/11/13 Age: 83 y.o. Gender: female  Primary Care Provider: Reymundo Poll, MD Consultants: Cardiology Code Status: PARTIAL  Pt Overview and Major Events to Date:  --Head CT showed no new ischemic changes or infarcts --CXR normal --EKG showed sinus rhythm, regular rate with LVH and abnormal repolarization, possible old anterior infarct  Assessment and Plan:  Hypertensive urgency: Patient had BP that reached 214/53 while in ED. Treated with Hydralazine and has been under 678 systolic and 938 diastolic since. -Continue with 10mg  Hydralazine PRN Q4 to keep BP below 180/120  Possible syncopal episode: Possibly from a brief arrhythmia given history of Afib. Vasovagal response is also reasonable given she was going to the bathroom at the time of the event. -Cardiology has been consulted, will f/u with their recs -Continue Amiodarone 100mg  daily -Consider restarting Losartan if BP starts to become uncontrolled, wait for Cardiology recs -Continue to monitor vitals per floor protocol.  Afib w/o RVR: Patient has history of Paroxysomal Afib. She is currently not in Afib -Continue Eliquis 2.5mg  BID -EKG showed sinus rhythm, normal rate, LVH with repolarization abnormality, and a possible old anterior infarct. -Continue Amiodarone 100mg  daily  Acute Kidney Injury: Resolved -Last BUN was 17, Creatinine was 0.96, which is her baseline.  Skull lesion: 2cm lesion found in the left frontal calvasrium -Patient has been previously notified of the lesion and requested no further workup. The benefits/risks were discussed with the patient during her previous admission.  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 CT scan on 9/5 showed previous right MCA infarct, no new intracranial process. Patient had stroke work up on  previous admission which was negative. -Continue Atorvastatin 40mg  daily  FEN/GI: heart healthy diet PPx: On Eliquis  Disposition: admit and monitor, cardiology recs  Subjective:  Patient was seen in the ED awaiting transfer to her room. Patient states she has no complaints at this time. She still has a slight headache but attributed it to not having a pillow. Patient could recall what happened but states she thinks she was just sleepy and wants to go home. She denies chest pain, SOB, abdominal pain, diarrhea, endorses recent constipation.  Objective: Temp:  [97.4 F (36.3 C)] 97.4 F (36.3 C) (09/05 1458) Pulse Rate:  [54-82] 82 (09/06 0700) Resp:  [14-27] 20 (09/06 0700) BP: (110-214)/(37-145) 173/145 (09/06 0700) SpO2:  [87 %-100 %] 87 % (09/06 0700) Physical Exam: General: alert and oriented x 3, NAD HEENT: NCAT, PERRL, EOMI, pharyngeal mucosa non-erythematous, no LAD Cardiovascular: RRR, 2/6 systolic murmur heard best at the LSB, normal S1, S2 Respiratory: CTAB anterior lung fields Abdomen: non-distended, non-tender, +BS in all four quadrants Extremities: limited range of motion in UEs, left shoulder is swollen, non-painful Skin: warm, dry, intact, no rashes  Laboratory:  Recent Labs Lab 10/27/16 1528 10/28/16 0338  WBC 7.4 10.8*  HGB 11.7* 11.6*  HCT 36.4 36.0  PLT 147* 146*    Recent Labs Lab 10/27/16 1528 10/28/16 0338  NA 138 141  K 4.1 3.9  CL 104 109  CO2 24 25  BUN 21* 17  CREATININE 1.24* 0.96  CALCIUM 9.1 9.1  PROT 6.1*  --   BILITOT 0.4  --   ALKPHOS 92  --   ALT 22  --   AST 30  --   GLUCOSE 94 88     Imaging/Diagnostic Tests: EKG was  significant for sinus rhythm, normal rate, LVH with repolarization abnormality, and a possible old anterior infarct U/A negative Troponin <0.03 PT/INR WNL Lactic Acid 1.5  Nuala Alpha, DO 10/28/2016, 1:57 PM PGY-1, [place] Intern pager: [phone], text pages welcome

## 2016-10-28 NOTE — Clinical Social Work Note (Signed)
Clinical Social Work Assessment  Patient Details  Name: Virginia Meyer MRN: [id number] Date of Birth: 1928-09-02  Date of referral:  10/28/16               Reason for consult:  Discharge Planning                Permission sought to share information with:  Chartered certified accountant granted to share information::  Yes, Verbal Permission Granted  Name::        Agency::  [facility]  Relationship::     Contact Information:     Housing/Transportation Living arrangements for the past 2 months:  [place] of Information:  Patient, Medical Team Patient Interpreter Needed:  None Criminal Activity/Legal Involvement Pertinent to Current Situation/Hospitalization:  No - Comment as needed Significant Relationships:  Adult Children Lives with:  Facility Resident Do you feel safe going back to the place where you live?  Yes Need for family participation in patient care:  Yes (Comment)  Care giving concerns:  Patient is from [facility].   Social Worker assessment / plan:  CSW met with patient. No supports at bedside. CSW introduced role and explained that discharge planning would be discussed. Patient confirmed that she is from [facility] and plans to return once stable for discharge. No further concerns. CSW encouraged patient to contact CSW as needed. CSW will continue to follow patient for support and facilitate discharge back to ALF once medically stable.  Employment status:  Retired Nurse, adult PT Recommendations:  Not assessed at this time Information / Referral to community resources:  [place]  Patient/Family's Response to care:  Patient agreeable to return to ALF. Patient's children supportive and involved in patient's care. Patient appreciated social work intervention.  Patient/Family's Understanding of and Emotional Response to Diagnosis, Current Treatment, and Prognosis:   Patient has a good understanding of the reason for admission and her need to return to ALF. Patient appears happy with hospital care.  Emotional Assessment Appearance:  Appears stated age Attitude/Demeanor/Rapport:  Other (Pleasant) Affect (typically observed):  Accepting, Appropriate, Calm, Pleasant Orientation:  Oriented to Self, Oriented to Place, Oriented to  Time, Oriented to Situation Alcohol / Substance use:  Never Used Psych involvement (Current and /or in the community):  No (Comment)  Discharge Needs  Concerns to be addressed:  Care Coordination Readmission within the last 30 days:  Yes Current discharge risk:  None Barriers to Discharge:  Continued Medical Work up   Candie Chroman, LCSW 10/28/2016, 3:39 PM

## 2016-10-29 DIAGNOSIS — R404 Transient alteration of awareness: Secondary | ICD-10-CM | POA: Diagnosis not present

## 2016-10-29 DIAGNOSIS — R0989 Other specified symptoms and signs involving the circulatory and respiratory systems: Secondary | ICD-10-CM

## 2016-10-29 LAB — GLUCOSE, CAPILLARY: Glucose-Capillary: 96 mg/dL (ref 65–99)

## 2016-10-29 MED ORDER — LOSARTAN POTASSIUM 50 MG PO TABS
50.0000 mg | ORAL_TABLET | Freq: Every day | ORAL | Status: DC
  Administered 2016-10-29: 50 mg via ORAL
  Filled 2016-10-29: qty 1

## 2016-10-29 MED ORDER — GABAPENTIN 100 MG PO CAPS: 100.0000 mg | ORAL_CAPSULE | Freq: Two times a day (BID) | ORAL | Status: AC

## 2016-10-29 NOTE — Discharge Summary (Signed)
[facility] Discharge Summary  Patient name: Virginia Meyer Medical record number: [id number] Date of birth: 09/27/1928 Age: 83 y.o. 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   Disposition: ALF  Discharge Condition: stable  Discharge Exam:   Gen: Alert and Oriented x 3, NAD HEENT: Normocephalic, atraumatic, PERRLA, EOMI, normal pharyngeal mucosa Neck: trachea midline, no thyroidmegaly, no LAD Resp: CTAB, no wheezing, rales, or rhonchi, comfortable work of breathing CV: RRR, 2/6 systolic murmur, normal S1, S2 split, +2 pulses dorsalis pedis bilaterally Abd: non-distended, non-tender, soft, +bs in all four quadrants, no hepatosplenomegaly MSK: FROM in upper extremities,wheelchair bound Ext: no clubbing, cyanosis, or edema Neuro: CN II-XII intact, no focal or gross deficits Psych: appropriate behavior, mood Skin: warm, dry, intact, no rashes   Brief Hospital Course:  [name] is an 83y/o female with PMH of HTN, HLD, GERD, PVD, prior CVA w/left sided deficits who is wheelchair bound who presented to the ED on 9/5 due to a possible syncopal episode. According to a [employer] employee she got up to go to the bathroom and became unresponsive. [name] says she remembers going to the bathroom and then she was in the EMS on the way to the hospital. She was worked up for a possible stroke and a Head CT on 9/5 showed no acute intracranial process. 9/5 CXR showed mild cardiomegaly, no edema, consolidation, effusion, or pneumothorax. [name] had a systolic BP of 401 while in the ED and it was  treated with 10mg  of Hydralazine, which lowered her BP. She continued to have fluctuating blood pressure while holding her Losartan and giving hydralazine as needed. A cardiology consult was made and they recommended no further changes.  [name] indicated on 9/6 in the afternoon she would like to go home. She denied any chest pain, shortness of breath, pain on breathing, headaches, vision changes, or nausea or vomiting at that time.  Her blood pressure has been highly variable. Her blood pressure has been consistently high on 9/7 and we have restarted her Losartan. Recommended her to have prompt follow up with her PCP to consider medication adjustments that may have contributed to her symptoms. [name] states she feels fine and that she now contends she never lost consciousness but was hard of hearing and sleepy, which was interpreted as becoming unresponsive.  She was cleared for discharge from a cardiac standpoint.  Issues for Follow Up:  1. Please see your PCP for follow up and discuss if you should retake Seroquel. It is a sedating medicine and could have contributed to your symptoms. 2. Please discuss your blood pressure medication Losartan and Hydralazine with your PCP.  Significant Procedures:  CT Head and CXR - Results noted in [facility] Course above in Discharge Summary   Significant Labs and Imaging:   Recent Labs Lab 10/27/16 1528 10/28/16 0338  WBC 7.4 10.8*  HGB 11.7* 11.6*  HCT 36.4 36.0  PLT 147* 146*    Recent Labs Lab 10/27/16 1528 10/28/16 0338  NA 138 141  K 4.1 3.9  CL 104 109  CO2 24 25  GLUCOSE 94 88  BUN 21* 17  CREATININE 1.24* 0.96  CALCIUM 9.1 9.1  ALKPHOS 92  --   AST 30  --   ALT 22  --   ALBUMIN 3.4*  --    U/A: negative for leukocytes or nitrites UCx: Multiple species present suggests recollection due to likely contamination. Troponin: negative < 0.03 Lactic Acid 1.5  Results/Tests Pending at Time of  Discharge:  none  Discharge Medications:  Allergies as of 10/29/2016   No Known Allergies     Medication List    STOP taking these medications   hydrALAZINE 10 MG tablet Commonly known as:  APRESOLINE   QUEtiapine 25 MG tablet Commonly known as:  SEROQUEL     TAKE these medications   amiodarone 100 MG tablet Commonly known as:  PACERONE Take 1 tablet (100 mg total) by mouth daily.   apixaban 2.5 MG Tabs tablet Commonly known as:  ELIQUIS Take 1 tablet (2.5 mg total) by mouth 2 (two) times daily.   atorvastatin 40 MG tablet Commonly known as:  LIPITOR Take 1 tablet (40 mg total) by mouth daily at 6 PM.   BIOFREEZE 4 % Gel Generic drug:  Menthol (Topical Analgesic) Apply 1 application topically See admin instructions. FOUR TIMES A DAY FOR BILATERAL KNEE PAIN   gabapentin 100 MG capsule Commonly known as:  NEURONTIN Take 1 capsule (100 mg total) by mouth 2 (two) times daily. 100 mg in the morning and 200 mg at bedtime What changed:  how much to take  when to take this   losartan 50 MG tablet Commonly known as:  COZAAR Take 1 tablet (50 mg total) by mouth daily.   meclizine 12.5 MG tablet Commonly known as:  ANTIVERT Take 12.5 mg by mouth every 8 (eight) hours as needed for dizziness. Not to exceed 3 doses/24 hours   polyethylene glycol packet Commonly known as:  MIRALAX / GLYCOLAX Take 17 g by mouth daily as needed for mild constipation. To be mixed with 6 ounces of fluid   sertraline 25 MG tablet Commonly known as:  ZOLOFT Take 25 mg by mouth daily.   traMADol 50 MG tablet Commonly known as:  ULTRAM Take 25 mg by mouth every 8 (eight) hours as needed (for pain).            Discharge Care Instructions        Start     Ordered   10/29/16 0000  gabapentin (NEURONTIN) 100 MG capsule  2 times daily     10/29/16 0950      Discharge Instructions: Please refer to Patient Instructions section of EMR for full details.  Patient was counseled important  signs and symptoms that should prompt return to medical care, changes in medications, dietary instructions, activity restrictions, and follow up appointments.   Follow-Up Appointments: Follow-up Information    Reymundo Poll, MD Follow up.   Specialty:  Family Medicine Why:  Please follow up with your PCP within one week due to your recent hospitalization. It is important to have prompt hospital follow up with your regular doctor. Contact information: [address] [place] [address] [phone]           Nuala Alpha, DO 10/29/2016, 1:44 PM PGY-1, [place]

## 2016-10-29 NOTE — Clinical Social Work Note (Addendum)
CSW left voicemail for RN at ALF to confirm patient can return today.  Dayton Scrape, CSW [phone]  12:24 pm Received confirmation that patient can return to ALF today. MD notified and CSW asked her to have discharge summary in by 3:30 pm at the latest so the facility can order medications.  Dayton Scrape, [place]

## 2016-10-29 NOTE — Progress Notes (Signed)
Spoke with patient's daughter, [name] @ [phone] and confirmed that discharge orders are expected today.  [name] stated she will pick her up and transport her back to [facility] once everything is in place.

## 2016-10-29 NOTE — NC FL2 (Signed)
[place] LEVEL OF CARE SCREENING TOOL     IDENTIFICATION  Patient Name: Virginia Meyer Birthdate: [birth year] Sex: female Admission Date (Current Location): 10/27/2016  [facility] and [place] Number:  Herbalist and Address:  [facility], [place] [address] [place], [place] [address]      Provider Number: [id number]  Attending Physician Name and Address:  Dickie La, MD  Relative Name and Phone Number:       Current Level of Care: Hospital Recommended Level of Care: [place] (continue PT) Prior Approval Number:    Date Approved/Denied:   PASRR Number:    Discharge Plan: Other (Comment) (ALF with continued PT)    Current Diagnoses: Patient Active Problem List   Diagnosis Date Noted  . Transient alteration of awareness   . Labile blood pressure   . Hypertensive urgency 10/27/2016  . Decreased responsiveness   . Bradycardia 10/19/2016  . Hypotension 10/19/2016  . Skull lesion 10/19/2016  . History of cerebrovascular accident (CVA) with residual deficit 10/19/2016  . Personal history of atrial flutter 10/19/2016  . Thoracic aortic atherosclerosis ([place]) 10/19/2016  . Intracranial atherosclerosis 10/19/2016  . Frailty syndrome in geriatric patient 10/19/2016  . Acute kidney injury ([place]) 10/19/2016  . 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 Syncope, Recurrent 10/18/2016  . Carotid artery disease ([place]) 07/06/2016  . Gait abnormality 05/13/2016  . Cerebrovascular accident (CVA) due to thrombosis of right anterior cerebral artery ([place]) 05/13/2016  . History of heart block 05/07/2016  . Benign labile hypertension 08/21/2015  . Acute CVA (cerebrovascular accident) ([place]) 08/04/2015  . Left hemiparesis ([place]) 08/04/2015  . Primary osteoarthritis of left knee 08/04/2015  . Esophageal reflux 08/04/2015  . Hyperlipidemia LDL goal <70 08/04/2015  . Paroxysmal atrial fibrillation ([place])  08/04/2015  . PVD (peripheral vascular disease) ([place]) 08/04/2015  . Protein-calorie malnutrition ([place]) 08/04/2015    Orientation RESPIRATION BLADDER Height & Weight     Self, Time, Situation, Place  Normal Incontinent Weight: 119 lb 7.8 oz (54.2 kg) Height:  5' (152.4 cm)  BEHAVIORAL SYMPTOMS/MOOD NEUROLOGICAL BOWEL NUTRITION STATUS   (None)  (History of CVA) Incontinent Diet (NAS)  AMBULATORY STATUS COMMUNICATION OF NEEDS Skin     Verbally Bruising                       Personal Care Assistance Level of Assistance              Functional Limitations Info  Sight, Hearing, Speech Sight Info: Adequate Hearing Info: Adequate Speech Info: Adequate    SPECIAL CARE FACTORS FREQUENCY  PT (By licensed PT), Blood pressure     PT Frequency: 3 x week              Contractures Contractures Info: Not present    Additional Factors Info  Code Status, Allergies, Isolation Precautions Code Status Info: Partial: DNI Allergies Info: NKDA     Isolation Precautions Info: Contact: MRSA     Current Medications (10/29/2016):  This is the current hospital active medication list Current Facility-Administered Medications  Medication Dose Route Frequency Provider Last Rate Last Dose  . amiodarone (PACERONE) tablet 100 mg  100 mg Oral Daily Wendee Beavers T, MD   100 mg at 10/29/16 1124  . apixaban (ELIQUIS) tablet 2.5 mg  2.5 mg Oral BID Wendee Beavers T, MD   2.5 mg at 10/29/16 1125  . atorvastatin (LIPITOR) tablet 40 mg  40 mg Oral q1800 Wendee Beavers T, MD   40 mg at 10/28/16 1744  . gabapentin (NEURONTIN) capsule 100 mg  100 mg Oral BID Wendee Beavers T, MD   100 mg at 10/29/16 1000  . hydrALAZINE (APRESOLINE) injection 10 mg  10 mg Intravenous Q4H PRN Wendee Beavers T, MD   10 mg at 10/29/16 1126  . losartan (COZAAR) tablet 50 mg  50 mg Oral Daily Smiley Houseman, MD   50 mg at 10/29/16 1343     Discharge Medications: STOP taking these medications           hydrALAZINE 10 MG  tablet Commonly known as:  APRESOLINE    QUEtiapine 25 MG tablet Commonly known as:  SEROQUEL                       TAKE these medications            amiodarone 100 MG tablet Commonly known as:  PACERONE Take 1 tablet (100 mg total) by mouth daily.    apixaban 2.5 MG Tabs tablet Commonly known as:  ELIQUIS Take 1 tablet (2.5 mg total) by mouth 2 (two) times daily.    atorvastatin 40 MG tablet Commonly known as:  LIPITOR Take 1 tablet (40 mg total) by mouth daily at 6 PM.    BIOFREEZE 4 % Gel Generic drug:  Menthol (Topical Analgesic) Apply 1 application topically See admin instructions. FOUR TIMES A DAY FOR BILATERAL KNEE PAIN    gabapentin 100 MG capsule Commonly known as:  NEURONTIN Take 1 capsule (100 mg total) by mouth 2 (two) times daily. 100 mg in the morning and 200 mg at bedtime What changed:  how much to take  when to take this    losartan 50 MG tablet Commonly known as:  COZAAR Take 1 tablet (50 mg total) by mouth daily.    meclizine 12.5 MG tablet Commonly known as:  ANTIVERT Take 12.5 mg by mouth every 8 (eight) hours as needed for dizziness. Not to exceed 3 doses/24 hours    polyethylene glycol packet Commonly known as:  MIRALAX / GLYCOLAX Take 17 g by mouth daily as needed for mild constipation. To be mixed with 6 ounces of fluid    sertraline 25 MG tablet Commonly known as:  ZOLOFT Take 25 mg by mouth daily.    traMADol 50 MG tablet Commonly known as:  ULTRAM Take 25 mg by mouth every 8 (eight) hours as needed (for pain).                                         Discharge Care Instructions               Start     Ordered   10/29/16 0000  gabapentin (NEURONTIN) 100 MG capsule  2 times daily     10/29/16 0950     Relevant Imaging Results:  Relevant Lab Results:   Additional Information SS#: [id number]  Candie Chroman, LCSW

## 2016-10-29 NOTE — Progress Notes (Signed)
Reviewed discharge instructions with patient and daughter.  Next dose due for medications entered on AVS sheet and shown to patient's daughter.  Patient and daughter confirmed she has cell phone, charger, eyeglasses, and hearing aides x 2 with her at time of discharge.  Patient dressed in personal clothing for transport to [facility].  Attempted to call report to [phone] and left message.  Daughter informed report had been attempted and will retry report again.

## 2016-10-29 NOTE — Discharge Instructions (Signed)

## 2016-10-29 NOTE — Progress Notes (Addendum)
Family Medicine Teaching Service Daily Progress Note Intern Pager: [phone]  Patient name: Virginia Meyer Medical record number: [id number] Date of birth: 03/02/1928 Age: 83 y.o. Gender: female  Primary Care Provider: Reymundo Poll, MD Consultants: Cardiology Code Status: PARTIAL  Pt Overview and Major Events to Date:  --Head CT showed no new ischemic changes or infarcts --CXR normal --EKG showed sinus rhythm, regular rate with LVH and abnormal repolarization, possible old anterior infarct  Assessment and Plan:  Hypertensive urgency: Patient had BP that reached 214/53 while in ED. Treated with Hydralazine and has been under 258 systolic and 527 diastolic since. -Continue with 10mg  Hydralazine PRN Q4 to keep BP below 180/120 -Continue to monitor BP. Past 24 hour range has been 125-167/52-86.  Possible syncopal episode: Possibly from a brief arrhythmia given history of Afib. Vasovagal response is also reasonable given she was going to the bathroom at the time of the event. -Cardiology recs no changes to her medication at this time -Continue Amiodarone 100mg  daily -Resume Losartan on discharge -Discharge to ALF today.  Afib w/o RVR: Patient has history of Paroxysomal Afib. She is currently not in Afib -Continue Eliquis 2.5mg  BID -EKG showed sinus rhythm, normal rate, LVH with repolarization abnormality, and a possible old anterior infarct. -Continue Amiodarone 100mg  daily  Acute Kidney Injury: Resolved -Last BUN was 17, Creatinine was 0.96, which is her baseline.  Skull lesion: 2cm lesion found in the left frontal calvasrium -Patient has been previously notified of the lesion and requested no further workup. The benefits/risks were discussed with the patient during her previous admission.  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 CT scan on 9/5 showed previous right MCA infarct, no new intracranial process. Patient had stroke work up on previous  admission which was negative. -Continue Atorvastatin 40mg  daily  FEN/GI: heart healthy diet PPx: On Eliquis  Disposition: admit and monitor, cardiology recs  Subjective:  Patient this morning states she feels good and would like to go back to her assisted living facility. She denies any headache, chest pain, SOB, difficulty breathing, or abdominal pain.   Objective: Temp:  [98.5 F (36.9 C)-99.7 F (37.6 C)] 98.5 F (36.9 C) (09/07 0635) Pulse Rate:  [71-82] 73 (09/07 0635) Resp:  [18-21] 20 (09/07 0635) BP: (125-190)/(44-98) 125/56 (09/07 0635) SpO2:  [93 %-97 %] 95 % (09/07 0635) Weight:  [117 lb 8.1 oz (53.3 kg)-119 lb 7.8 oz (54.2 kg)] 119 lb 7.8 oz (54.2 kg) (09/07 7824) Physical Exam: General: alert and oriented x 3, NAD HEENT: NCAT, PERRL, EOMI, pharyngeal mucosa non-erythematous, no LAD Cardiovascular: RRR, 2/6 systolic murmur heard best at the LSB, normal S1, S2 Respiratory: CTAB anterior lung fields Abdomen: non-distended, non-tender, +BS in all four quadrants Extremities: limited range of motion in UEs, left shoulder is swollen, non-painful Skin: warm, dry, intact, no rashes  Laboratory:  Recent Labs Lab 10/27/16 1528 10/28/16 0338  WBC 7.4 10.8*  HGB 11.7* 11.6*  HCT 36.4 36.0  PLT 147* 146*    Recent Labs Lab 10/27/16 1528 10/28/16 0338  NA 138 141  K 4.1 3.9  CL 104 109  CO2 24 25  BUN 21* 17  CREATININE 1.24* 0.96  CALCIUM 9.1 9.1  PROT 6.1*  --   BILITOT 0.4  --   ALKPHOS 92  --   ALT 22  --   AST 30  --   GLUCOSE 94 88     Imaging/Diagnostic Tests: EKG was significant for sinus rhythm, normal rate, LVH with  repolarization abnormality, and a possible old anterior infarct U/A negative Troponin <0.03 PT/INR WNL Lactic Acid 1.5  Nuala Alpha, DO 10/29/2016, 9:19 AM PGY-1, [place] Intern pager: [phone], text pages welcome

## 2016-10-29 NOTE — Clinical Social Work Note (Signed)
CSW facilitated patient discharge including contacting patient family and facility to confirm patient discharge plans. Clinical information faxed to facility and family agreeable with plan. Patient's daughter will transport patient back to [facility]. RN to call report prior to discharge [phone]).  CSW will sign off for now as social work intervention is no longer needed. Please consult [place] again if new needs arise.  Virginia Meyer, [place]

## 2016-11-01 DIAGNOSIS — G4700 Insomnia, unspecified: Secondary | ICD-10-CM | POA: Diagnosis not present

## 2016-11-01 DIAGNOSIS — F4322 Adjustment disorder with anxiety: Secondary | ICD-10-CM | POA: Diagnosis not present

## 2016-11-01 DIAGNOSIS — R451 Restlessness and agitation: Secondary | ICD-10-CM | POA: Diagnosis not present

## 2016-11-01 DIAGNOSIS — R69 Illness, unspecified: Secondary | ICD-10-CM | POA: Diagnosis not present

## 2016-11-01 DIAGNOSIS — F329 Major depressive disorder, single episode, unspecified: Secondary | ICD-10-CM | POA: Diagnosis not present

## 2016-11-02 ENCOUNTER — Ambulatory Visit: Payer: Medicare HMO | Attending: Neurology | Admitting: Physical Therapy

## 2016-11-02 ENCOUNTER — Telehealth: Payer: Self-pay | Admitting: Neurology

## 2016-11-02 ENCOUNTER — Encounter: Payer: Self-pay | Admitting: Physical Therapy

## 2016-11-02 DIAGNOSIS — I69354 Hemiplegia and hemiparesis following cerebral infarction affecting left non-dominant side: Secondary | ICD-10-CM | POA: Diagnosis not present

## 2016-11-02 DIAGNOSIS — M6281 Muscle weakness (generalized): Secondary | ICD-10-CM | POA: Diagnosis not present

## 2016-11-02 DIAGNOSIS — R2681 Unsteadiness on feet: Secondary | ICD-10-CM

## 2016-11-02 DIAGNOSIS — I4891 Unspecified atrial fibrillation: Secondary | ICD-10-CM | POA: Diagnosis not present

## 2016-11-02 DIAGNOSIS — I9589 Other hypotension: Secondary | ICD-10-CM | POA: Diagnosis not present

## 2016-11-02 DIAGNOSIS — R69 Illness, unspecified: Secondary | ICD-10-CM | POA: Diagnosis not present

## 2016-11-02 DIAGNOSIS — R55 Syncope and collapse: Secondary | ICD-10-CM | POA: Diagnosis not present

## 2016-11-02 DIAGNOSIS — R29898 Other symptoms and signs involving the musculoskeletal system: Secondary | ICD-10-CM | POA: Diagnosis not present

## 2016-11-02 DIAGNOSIS — Z7901 Long term (current) use of anticoagulants: Secondary | ICD-10-CM | POA: Diagnosis not present

## 2016-11-02 DIAGNOSIS — Z8673 Personal history of transient ischemic attack (TIA), and cerebral infarction without residual deficits: Secondary | ICD-10-CM | POA: Diagnosis not present

## 2016-11-02 NOTE — Telephone Encounter (Signed)
Spoke to [facility]hab and relayed ok to resume PT, per Dr. Jaynee Eagles.

## 2016-11-02 NOTE — Telephone Encounter (Signed)
Angie/Neuro OT [phone] called said the pt was admitted to [facility] for observation on 9/5 for HBP. The patients daughter said Dr Roderic Pal[place] Berry/cardiology said the pt could resume PT but she does not see that it has been documented anywhere. She said the daughter has been trying to reach out to that office but has not rec'd a return call for a resume order.   Pt has been released back to [facility] but Janace Hoard needs an order to resume PT in [facility]. The pt has an appt today at 90, if this could be done asap. Thank you!

## 2016-11-02 NOTE — Telephone Encounter (Signed)
That's fine please order thanks 

## 2016-11-03 NOTE — Therapy (Signed)
[facility] [address] [place], [place], [address] Phone: [phone]   Fax:  [phone]  Physical Therapy Treatment  Patient Details  Name: Virginia Meyer MRN: [id number] Date of Birth: 07/12/1928 Referring Provider: Sarina Ill, MD  Encounter Date: 11/02/2016      PT End of Session - 11/02/16 1728    Visit Number 66  g-code due vist number 20   Number of Visits 33  recert 0/17   Date for PT Re-Evaluation 11/15/16   Authorization Type MCR; G-Code   Authorization Time Period 07/20/16 to 09/17/16; 09/16/16 to 11/15/16   PT Start Time 1100   PT Stop Time 1144   PT Time Calculation (min) 44 min   Equipment Utilized During Treatment Gait belt   Activity Tolerance Patient tolerated treatment well   Behavior During Therapy [facility] for tasks assessed/performed      Past Medical History:  Diagnosis Date  . Anxiety   . Fall   . GERD (gastroesophageal reflux disease)   . Heart failure ([place])   . HLD (hyperlipidemia)   . HTN (hypertension)   . Hyperlipidemia   . Hypotension   . Major depressive disorder   . Neuropathy   . PVD (peripheral vascular disease) ([place])   . Stroke [facility]    left sided deficit  . UTI (urinary tract infection)     Past Surgical History:  Procedure Laterality Date  . ABDOMINAL HYSTERECTOMY    . FOOT NEUROMA SURGERY    . SPINE SURGERY    . TONSILLECTOMY      There were no vitals filed for this visit.      Subjective Assessment - 11/02/16 1712    Subjective Doesn't feel any different. Happy to be back at therapy.    Pertinent History hospitalized with CHB 04/2016, afib, HTN, peripheral neuropathy, CHF, PVD, 05/27/15 large R MCA CVA, lt foot drop due to back sx 2015; *hospitalized 8/27-8/30 and 9/5-9/7 for syncope   Currently in Pain? No/denies            [facility] PT Assessment - 11/02/16 1714      AROM   Right Knee Extension -14   Left Knee Extension -22                      [facility] Adult PT Treatment/Exercise - 11/02/16 1714      Bed Mobility   Bed Mobility Supine to Sit;Sit to Supine   Supine to Sit 4: Min assist;HOB elevated   Sit to Supine 4: Min assist     Transfers   Transfers Sit to Stand;Stand to Sit;Stand Pivot Transfers   Sit to Stand 3: Mod assist;2: Max assist;With upper extremity assist;With armrests  to kitchen sink   Sit to Stand Details (indicate cue type and reason) pt retropulses   Stand to Sit 3: Mod assist;With upper extremity assist;With armrests;Uncontrolled descent   Stand Pivot Transfers 3: Mod assist;With armrests   Stand Pivot Transfer Details (indicate cue type and reason) to left required more assist than to right; pt with difficulty pivoting her foot on the carpet; retropulsion continues to be an issue     Posture/Postural Control   Posture/Postural Control Postural limitations   Postural Limitations Rounded Shoulders;Forward head;Increased thoracic kyphosis;Posterior pelvic tilt   Posture Comments worked on improved posture in standing     Static Standing Balance   Static Standing - Balance Support Right upper extremity supported;Left upper extremity supported   Static Standing -  Level of Assistance 4: Min assist   Static Standing - Comment/# of Minutes up to 3 minutes x 3; wt-shifting, wt-bearing through LLE (on 1" step); reaching with alternating UEs     Exercises   Other Exercises  MMT: Rt knee extension 3+, flexion 4; LLE knee extension 3, flexion 4 (in available ROM)     Knee/Hip Exercises: Aerobic   Other Aerobic restorator x 4 minutes with occasional min assist to secure Lt foot under pedal strap                PT Education - 11/02/16 1727    Education provided Yes   Education Details positioning LLE with towel/bolster on outside of leg to maintain neutral   Person(s) Educated Patient;Caregiver(s)   Methods Explanation;Demonstration   Comprehension Returned  demonstration;Verbalized understanding          PT Short Term Goals - 10/14/16 1810      PT SHORT TERM GOAL #1   Title Patient will increase rt knee extension to -10 degrees and Lt knee extension to -32 degrees to improve patient's ability to stand and work towards ambulation.    Baseline 8/23 Rt knee -8 knee extension, LT knee -30   Time 4   Period Weeks   Status Achieved     PT SHORT TERM GOAL #2   Title Patient will transfer wheelchair to/from bed with minimal assistance on 3 of 4 attempts with LRAD (as indicated).    Baseline 8/23 mod-max assist    Time 4   Period Weeks   Status Not Met     PT SHORT TERM GOAL #3   Title Patient will stand with left foot on 2-3" step and perform reaching tasks with single UE support for 6 minutes (to increase strength/endurance with ultimate goal to progress to ambulation).    Baseline 8/22 7 minutes standing wiht brief sitting when checkers fell to floor   Time 4   Period Weeks   Status Achieved           PT Long Term Goals - 09/30/16 1733      PT LONG TERM GOAL #1   Title Patient and caregiver/family will demonstrate independence with updated HEP     Time 8   Period Weeks   Status Unable to assess  due to lack of time; will be ongoing goal   Target Date 11/15/16     PT LONG TERM GOAL #4   Status --  due to lack of time; shortened session going to restroom     PT LONG TERM GOAL #5   Title Patient will have a method to educate her caregivers at the ALF on the best method to help her transfer (ex. laminated sheet).    Baseline Patient has been unable to remember sequence for transfer.    Time 8   Period Weeks   Status New     PT LONG TERM GOAL #6   Title Patient will stand with LRAD with min assist.    Time 8   Period Weeks   Status New     PT LONG TERM GOAL #7   Title Patient will perform pre-gait/balance activities x 5 minutes (no step under left foot) with LRAD.    Time 8   Period Weeks   Status New     PT LONG  TERM GOAL #8   Title Patient will be independent with her updated HEP for stretching and strengthening using handout.    Time  8   Period Weeks   Status New               Plan - 11/02/16 1730    Clinical Impression Statement Patient returned to clinic after twice hospitalized for syncope (with uncontrolled HTN). Her strength in bil LEs, balance, and transfers are close to her last visit on 10/14/16. Remaining LTGs are appropriate. Anticipate will request recertification as she continues to make improvements in bil LE ROM and current progress has been slowed due to health issues and missing 2 weeks of therapy.     Rehab Potential Fair   Clinical Impairments Affecting Rehab Potential duration >12 months since onset CVA; left foot drop from back surgery prior to CVA; bil LE contractures; need for assistance to carryover HEP and other mobility education   PT Frequency 2x / week   PT Duration 8 weeks   PT Treatment/Interventions ADLs/Self Care Home Management;DME Instruction;Gait training;Functional mobility training;Therapeutic activities;Therapeutic exercise;Balance training;Neuromuscular re-education;Cognitive remediation;Patient/family education;Orthotic Fit/Training;Wheelchair mobility training;Manual techniques;Moist Heat;Ultrasound;Passive range of motion;Splinting;Taping   PT Next Visit Plan Needs to schedule 2x/wk x 8 weeks beyond her 9/25 appt; Transfers, transfers, transfers. Continue to work on independence with sequencing in performance of squat pivot vs stand-pivot and sit to stand transfers ; improving standing tolerance (try reaching); increasing bilateral LE ROM.    Consulted and Agree with Plan of Care Patient;Family member/caregiver   Family Member Consulted Caregiver      Patient will benefit from skilled therapeutic intervention in order to improve the following deficits and impairments:  Decreased balance, Decreased coordination, Decreased knowledge of use of DME, Decreased  mobility, Decreased range of motion, Decreased strength, Difficulty walking, Impaired flexibility, Impaired sensation, Impaired UE functional use, Postural dysfunction, Decreased cognition  Visit Diagnosis: Other symptoms and signs involving the musculoskeletal system  Muscle weakness (generalized)  Unsteadiness on feet     Problem List Patient Active Problem List   Diagnosis Date Noted  . Transient alteration of awareness   . Labile blood pressure   . Hypertensive urgency 10/27/2016  . Decreased responsiveness   . Bradycardia 10/19/2016  . Hypotension 10/19/2016  . Skull lesion 10/19/2016  . History of cerebrovascular accident (CVA) with residual deficit 10/19/2016  . Personal history of atrial flutter 10/19/2016  . Thoracic aortic atherosclerosis ([place]) 10/19/2016  . Intracranial atherosclerosis 10/19/2016  . Frailty syndrome in geriatric patient 10/19/2016  . Acute kidney injury ([place]) 10/19/2016  . 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 Syncope, Recurrent 10/18/2016  . Carotid artery disease ([place]) 07/06/2016  . Gait abnormality 05/13/2016  . Cerebrovascular accident (CVA) due to thrombosis of right anterior cerebral artery ([place]) 05/13/2016  . History of heart block 05/07/2016  . Benign labile hypertension 08/21/2015  . Acute CVA (cerebrovascular accident) ([place]) 08/04/2015  . Left hemiparesis ([place]) 08/04/2015  . Primary osteoarthritis of left knee 08/04/2015  . Esophageal reflux 08/04/2015  . Hyperlipidemia LDL goal <70 08/04/2015  . Paroxysmal atrial fibrillation ([place]) 08/04/2015  . PVD (peripheral vascular disease) ([place]) 08/04/2015  . Protein-calorie malnutrition ([place]) 08/04/2015    Rexanne Mano, PT 11/03/2016, 7:48 AM  [facility] [address] [place], [place], [address] Phone: [phone]   Fax:  [phone]  Name: Virginia Meyer MRN: [id number] Date of Birth:  1928/12/24

## 2016-11-09 ENCOUNTER — Ambulatory Visit: Payer: Medicare HMO | Admitting: Physical Therapy

## 2016-11-09 ENCOUNTER — Encounter: Payer: Self-pay | Admitting: Physical Therapy

## 2016-11-09 DIAGNOSIS — I69354 Hemiplegia and hemiparesis following cerebral infarction affecting left non-dominant side: Secondary | ICD-10-CM | POA: Diagnosis not present

## 2016-11-09 DIAGNOSIS — R29898 Other symptoms and signs involving the musculoskeletal system: Secondary | ICD-10-CM

## 2016-11-09 DIAGNOSIS — R2681 Unsteadiness on feet: Secondary | ICD-10-CM | POA: Diagnosis not present

## 2016-11-09 DIAGNOSIS — M6281 Muscle weakness (generalized): Secondary | ICD-10-CM | POA: Diagnosis not present

## 2016-11-10 NOTE — Therapy (Signed)
[facility] [address] [place], [place], [address] Phone: [phone]   Fax:  [phone]  Physical Therapy Treatment  Patient Details  Name: Virginia Meyer MRN: [id number] Date of Birth: 25-Mar-1928 Referring Provider: Sarina Ill, MD  Encounter Date: 11/09/2016   11/09/16 1022  PT Visits / Re-Eval  Visit Number 24 (g-code due vist number 70)  Number of Visits 33 (recert 4/76)  Date for PT Re-Evaluation 11/15/16  Authorization  Authorization Type MCR; G-Code  Authorization Time Period 07/20/16 to 09/17/16; 09/16/16 to 11/15/16  PT Time Calculation  PT Start Time 1018  PT Stop Time 1100  PT Time Calculation (min) 42 min  PT - End of Session  Equipment Utilized During Treatment Gait belt  Activity Tolerance Patient tolerated treatment well  Behavior During Therapy [facility] for tasks assessed/performed      Past Medical History:  Diagnosis Date  . Anxiety   . Fall   . GERD (gastroesophageal reflux disease)   . Heart failure ([place])   . HLD (hyperlipidemia)   . HTN (hypertension)   . Hyperlipidemia   . Hypotension   . Major depressive disorder   . Neuropathy   . PVD (peripheral vascular disease) ([place])   . Stroke [facility])    left sided deficit  . UTI (urinary tract infection)     Past Surgical History:  Procedure Laterality Date  . ABDOMINAL HYSTERECTOMY    . FOOT NEUROMA SURGERY    . SPINE SURGERY    . TONSILLECTOMY      There were no vitals filed for this visit.     11/09/16 1022  PT Visits / Re-Eval  Visit Number 55 (g-code due vist number 20)  Number of Visits 33 (recert 5/46)  Date for PT Re-Evaluation 11/15/16  Authorization  Authorization Type MCR; G-Code  Authorization Time Period 07/20/16 to 09/17/16; 09/16/16 to 11/15/16  PT Time Calculation  PT Start Time 1018  PT Stop Time 1100  PT Time Calculation (min) 42 min  PT - End of Session  Equipment Utilized During Treatment Gait belt   Activity Tolerance Patient tolerated treatment well  Behavior During Therapy [facility] for tasks assessed/performed      11/09/16 1022  Transfers  Transfers Sit to Stand;Stand to [employer] Transfers  Sit to Stand 3: Mod assist;With upper extremity assist;4: Min assist;From chair/3-in-1;From bed  Sit to Stand Details Tactile cues for weight shifting;Tactile cues for sequencing;Verbal cues for sequencing;Verbal cues for technique;Manual facilitation for weight shifting;Manual facilitation for placement;Manual facilitation for weight bearing  Stand to Sit 3: Mod assist;4: Min assist;With upper extremity assist;To bed;To chair/3-in-1  Stand to Sit Details (indicate cue type and reason) Tactile cues for sequencing;Tactile cues for weight shifting;Verbal cues for sequencing;Verbal cues for technique;Manual facilitation for weight shifting;Manual facilitation for placement;Manual facilitation for weight bearing  Stand Pivot Transfers 3: Mod assist;4: Min assist  Stand Pivot Transfer Details (indicate cue type and reason) blocked practice for stand pivot transfers wheelchair<>mat table with multimodal cues for sequencing and technique with pt progressing from mod assist to min assist as reps progressed with max cues initally progressing to min reminder cues                               Comments sit<>stand x2 reps with emphasis on standing posture (separate from ones performed with stand pivot transfers): each stand progressed from max assist to min/mod assist with cues for trunk/hip/knee  extension. facilitation provided at hips and kness as needed. pt was able to progress to alternating UE raises on 2cd stand for 4-5 reps each side.                             Knee/Hip Exercises: Supine  Short Arc Quad Sets AROM;Strengthening;Both;2 sets;10 reps;Limitations  Short Arc Quad Sets Limitations cues for correct ex form and technique. cues for full knee extension with each rep as well.  Bridges Limitations  with bil legs: 2 sets of 10 reps performed with manual stability provided at feet/knees.   Heel Slides AROM;Strengthening;Both;1 set;10 reps;Limitations  Heel Slides Limitations foot on pillow case on sliding board: manual resistance provided for strengthening with each rep  Manual Therapy  Other Manual Therapy contract relax techniqe to both knee for knee extension          PT Short Term Goals - 10/14/16 1810      PT SHORT TERM GOAL #1   Title Patient will increase rt knee extension to -10 degrees and Lt knee extension to -32 degrees to improve patient's ability to stand and work towards ambulation.    Baseline 8/23 Rt knee -8 knee extension, LT knee -30   Time 4   Period Weeks   Status Achieved     PT SHORT TERM GOAL #2   Title Patient will transfer wheelchair to/from bed with minimal assistance on 3 of 4 attempts with LRAD (as indicated).    Baseline 8/23 mod-max assist    Time 4   Period Weeks   Status Not Met     PT SHORT TERM GOAL #3   Title Patient will stand with left foot on 2-3" step and perform reaching tasks with single UE support for 6 minutes (to increase strength/endurance with ultimate goal to progress to ambulation).    Baseline 8/22 7 minutes standing wiht brief sitting when checkers fell to floor   Time 4   Period Weeks   Status Achieved           PT Long Term Goals - 09/30/16 1733      PT LONG TERM GOAL #1   Title Patient and caregiver/family will demonstrate independence with updated HEP     Time 8   Period Weeks   Status Unable to assess  due to lack of time; will be ongoing goal   Target Date 11/15/16     PT LONG TERM GOAL #4   Status --  due to lack of time; shortened session going to restroom     PT LONG TERM GOAL #5   Title Patient will have a method to educate her caregivers at the ALF on the best method to help her transfer (ex. laminated sheet).    Baseline Patient has been unable to remember sequence for transfer.    Time 8    Period Weeks   Status New     PT LONG TERM GOAL #6   Title Patient will stand with LRAD with min assist.    Time 8   Period Weeks   Status New     PT LONG TERM GOAL #7   Title Patient will perform pre-gait/balance activities x 5 minutes (no step under left foot) with LRAD.    Time 8   Period Weeks   Status New     PT LONG TERM GOAL #8   Title Patient will be independent with her updated HEP for  stretching and strengthening using handout.    Time 8   Period Weeks   Status New        11/09/16 1022  Plan  Clinical Impression Statement Today's skilled session continued to address LE knee extension, strengthening and transfer training with no issues reported in session. Pt continues to need step by step instruction with stand/pivot transfers, howvever requires less assistance to perform them when given time and cues. Pt is progressing toward goals and should benefit from                               Pt will benefit from skilled therapeutic intervention in order to improve on the following deficits Decreased balance;Decreased coordination;Decreased knowledge of use of DME;Decreased mobility;Decreased range of motion;Decreased strength;Difficulty walking;Impaired flexibility;Impaired sensation;Impaired UE functional use;Postural dysfunction;Decreased cognition  Rehab Potential Fair  Clinical Impairments Affecting Rehab Potential duration >12 months since onset CVA; left foot drop from back surgery prior to CVA; bil LE contractures; need for assistance to carryover HEP and other mobility education  PT Frequency 2x / week  PT Duration 8 weeks  PT Treatment/Interventions ADLs/Self Care Home Management;DME Instruction;Gait training;Functional mobility training;Therapeutic activities;Therapeutic exercise;Balance training;Neuromuscular re-education;Cognitive remediation;Patient/family education;Orthotic Fit/Training;Wheelchair mobility training;Manual techniques;Moist Heat;Ultrasound;Passive range  of motion;Splinting;Taping  PT Next Visit Plan Needs to schedule 2x/wk x 8 weeks beyond her 9/25 appt; Transfers, transfers, transfers. Continue to work on independence with sequencing in performance of squat pivot vs stand-pivot and sit to stand transfers ; improving standing tolerance (try reaching); increasing bilateral LE ROM.   Consulted and Agree with Plan of Care Patient;Family member/caregiver  Family Member Consulted Caregiver          Patient will benefit from skilled therapeutic intervention in order to improve the following deficits and impairments:  Decreased balance, Decreased coordination, Decreased knowledge of use of DME, Decreased mobility, Decreased range of motion, Decreased strength, Difficulty walking, Impaired flexibility, Impaired sensation, Impaired UE functional use, Postural dysfunction, Decreased cognition  Visit Diagnosis: Other symptoms and signs involving the musculoskeletal system  Muscle weakness (generalized)  Unsteadiness on feet  Hemiplegia and hemiparesis following cerebral infarction affecting left non-dominant side [facility])     Problem List Patient Active Problem List   Diagnosis Date Noted  . Transient alteration of awareness   . Labile blood pressure   . Hypertensive urgency 10/27/2016  . Decreased responsiveness   . Bradycardia 10/19/2016  . Hypotension 10/19/2016  . Skull lesion 10/19/2016  . History of cerebrovascular accident (CVA) with residual deficit 10/19/2016  . Personal history of atrial flutter 10/19/2016  . Thoracic aortic atherosclerosis ([place]) 10/19/2016  . Intracranial atherosclerosis 10/19/2016  . Frailty syndrome in geriatric patient 10/19/2016  . Acute kidney injury ([place]) 10/19/2016  . 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 Syncope, Recurrent 10/18/2016  . Carotid artery disease ([place]) 07/06/2016  . Gait abnormality 05/13/2016  . Cerebrovascular accident (CVA) due to thrombosis of right  anterior cerebral artery ([place]) 05/13/2016  . History of heart block 05/07/2016  . Benign labile hypertension 08/21/2015  . Acute CVA (cerebrovascular accident) ([place]) 08/04/2015  . Left hemiparesis ([place]) 08/04/2015  . Primary osteoarthritis of left knee 08/04/2015  . Esophageal reflux 08/04/2015  . Hyperlipidemia LDL goal <70 08/04/2015  . Paroxysmal atrial fibrillation ([place]) 08/04/2015  . PVD (peripheral vascular disease) ([place]) 08/04/2015  . Protein-calorie malnutrition ([place]) 08/04/2015    Willow Ora, PTA, [facility] [address]  [place], [place] [address] [phone] 11/10/16, 8:58 PM   Name: Britny Riel MRN: [id number] Date of Birth: 1928-03-28

## 2016-11-11 ENCOUNTER — Ambulatory Visit: Payer: Medicare HMO | Admitting: Physical Therapy

## 2016-11-16 ENCOUNTER — Ambulatory Visit: Payer: Medicare HMO | Admitting: Physical Therapy

## 2016-11-18 ENCOUNTER — Ambulatory Visit: Payer: Medicare HMO | Admitting: Physical Therapy

## 2016-11-23 ENCOUNTER — Ambulatory Visit: Payer: Medicare HMO | Attending: Neurology | Admitting: Physical Therapy

## 2016-11-23 ENCOUNTER — Encounter: Payer: Self-pay | Admitting: Physical Therapy

## 2016-11-23 DIAGNOSIS — R2681 Unsteadiness on feet: Secondary | ICD-10-CM | POA: Diagnosis present

## 2016-11-23 DIAGNOSIS — M6281 Muscle weakness (generalized): Secondary | ICD-10-CM

## 2016-11-23 DIAGNOSIS — R2689 Other abnormalities of gait and mobility: Secondary | ICD-10-CM | POA: Diagnosis present

## 2016-11-23 DIAGNOSIS — R29898 Other symptoms and signs involving the musculoskeletal system: Secondary | ICD-10-CM | POA: Diagnosis not present

## 2016-11-24 DIAGNOSIS — R29898 Other symptoms and signs involving the musculoskeletal system: Secondary | ICD-10-CM | POA: Diagnosis not present

## 2016-11-24 NOTE — Therapy (Signed)
[facility] [address] [place], [place], [address] Phone: [phone]   Fax:  [phone]  Physical Therapy Treatment  Patient Details  Name: Virginia Meyer MRN: [id number] Date of Birth: February 15, 1929 Referring Provider: Sarina Ill, MD  Encounter Date: 11/23/2016      PT End of Session - 11/23/16 2327    Visit Number 20  g-code due vist number 30   Number of Visits 45     Date for PT Re-Evaluation 01/22/17   Authorization Type MCR; G-Code   Authorization Time Period 07/20/16 to 09/17/16; 09/16/16 to 11/15/16; 11/23/16 to 01/22/17   PT Start Time 1018   PT Stop Time 1057   PT Time Calculation (min) 39 min   Equipment Utilized During Treatment Gait belt   Activity Tolerance Patient tolerated treatment well   Behavior During Therapy [facility] for tasks assessed/performed      Past Medical History:  Diagnosis Date  . Anxiety   . Fall   . GERD (gastroesophageal reflux disease)   . Heart failure ([place])   . HLD (hyperlipidemia)   . HTN (hypertension)   . Hyperlipidemia   . Hypotension   . Major depressive disorder   . Neuropathy   . PVD (peripheral vascular disease) ([place])   . Stroke [facility])    left sided deficit  . UTI (urinary tract infection)     Past Surgical History:  Procedure Laterality Date  . ABDOMINAL HYSTERECTOMY    . FOOT NEUROMA SURGERY    . SPINE SURGERY    . TONSILLECTOMY      There were no vitals filed for this visit.      Subjective Assessment - 11/23/16 1016    Subjective No new complaints. No falls. Reports she missed seveal appointments due to difficulty with transportation.    Patient is accompained by: Family member  caregiver   Pertinent History hospitalized with CHB 04/2016, afib, HTN, peripheral neuropathy, CHF, PVD, 05/27/15 large R MCA CVA, lt foot drop due to back sx 2015; *hospitalized 8/27-8/30 and 9/5-9/7 for syncope   Limitations Standing;Walking   How long can you stand  comfortably? unable   How long can you walk comfortably? unable; self-propels her w/c with bil LEs and RUE   Patient Stated Goals she wants to learn to stand and walk again.   Currently in Pain? No/denies  at beginning of session (seated)            [facility] PT Assessment - 11/24/16 0001      AROM   Right Knee Extension -11   Left Knee Extension -23                     [facility] Adult PT Treatment/Exercise - 11/24/16 0001      Bed Mobility   Bed Mobility Supine to Sit;Sit to Supine   Supine to Sit 4: Min assist   Sit to Supine 3: Mod assist   Sit to Supine - Details (indicate cue type and reason) incr assist provided to speed up the process     Transfers   Transfers Sit to Stand;Stand to Sit;Stand Pivot Transfers;Squat Pivot Transfers   Sit to Stand 3: Mod assist;With upper extremity assist;From chair/3-in-1;Uncontrolled descent   Sit to Stand Details Tactile cues for weight shifting;Tactile cues for sequencing;Verbal cues for sequencing;Verbal cues for technique;Manual facilitation for weight shifting;Manual facilitation for placement;Manual facilitation for weight bearing   Sit to Stand Details (indicate cue type and reason) better anterior wt-shift  with left foot on 2" step   Stand to Sit 3: Mod assist   Stand to Sit Details (indicate cue type and reason) Tactile cues for sequencing;Tactile cues for weight shifting;Verbal cues for sequencing;Verbal cues for technique;Manual facilitation for weight shifting;Manual facilitation for placement;Manual facilitation for weight bearing   Stand Pivot Transfers 3: Mod assist;With armrests   Squat Pivot Transfers 3: Mod assist;With upper extremity assistance   Comments sit to stand x 3 in // bars with mod assist, x 2 to rollator with mod assist     Balance   Balance Assessed Yes     Static Sitting Balance   Static Sitting - Balance Support No upper extremity supported;Feet supported   Static Sitting - Level of Assistance 7:  Independent     Static Standing Balance   Static Standing - Balance Support Bilateral upper extremity supported   Static Standing - Level of Assistance 4: Min assist   Static Standing - Comment/# of Minutes in // bars with left foot on 2 inch block to level pelvis and above.                   PT Short Term Goals - 11/24/16 1728      PT SHORT TERM GOAL #1   Title Patient will either improve Lt knee extension to -15 degrees or will have consulted orthotist to obtain built up shoe to allow progression to walking. (Target for all STGs 12/23/16)   Baseline 10/02  Lt knee extension -23    Time 4   Period Weeks   Status Revised   Target Date 12/23/16     PT SHORT TERM GOAL #2   Title Patient will transfer wheelchair to/from bed with minimal assistance on tile flooring at least 50% of the attempted transfers.     Baseline 8/23 mod-max assist    Time 4   Period Weeks   Status Revised     PT SHORT TERM GOAL #3   Title Patient will sit to stand to rollator with up to mod assist and perform pre-gait tasks with up to min assist x 3 minutes.    Time 4   Period Weeks   Status Revised           PT Long Term Goals - 11/23/16 2308      PT LONG TERM GOAL #1   Title Patient and caregiver/family will demonstrate independence with updated HEP  (11/23/16 see updated goal below)   Time 8   Period Weeks   Status On-going  Patient with many missed appt recently due to transportation issues     PT LONG TERM GOAL #3   Title Patient will perform sit to stand with LRAD with minimal assistance. (Target 01/22/17)   Baseline Added 11/23/16   Time 8   Period Weeks   Status Revised     PT LONG TERM GOAL #4   Title Patient will ambulate 10 ft with LRAD and up to moderate assistance and chair to follow.    Time 8   Period Weeks   Status New       PT LONG TERM GOAL #5   Title Patient will have a method to educate her caregivers at the ALF on the best method to help her transfer (ex.  laminated sheet).    Baseline Patient has been unable to remember sequence for transfer. 10/02 No successful method developed (pt forgets to verbally instruct them in how to help her; pt and aide  report the staff "move so fast, they don't slow down to look at pictures or ask quesions.   Time 8   Period Weeks   Status Not Met     PT LONG TERM GOAL #6   Title Patient will stand with LRAD with mod assist.    Baseline 10/02 stood to rollator with mod assist, and able to maintain standing with up to min assist   Time 8   Period Weeks   Status Achieved     PT LONG TERM GOAL #7   Title Patient will perform pre-gait/balance activities x 5 minutes (no step under left foot) with LRAD. (11/23/16 goal updated/replaced above--no date extension)   Baseline 10/02 can do 5 minutes WITH 2" step under LLE in // bars   Time 8   Period Weeks   Status Partially Met     PT LONG TERM GOAL #8   Title Patient will be independent with her updated HEP for stretching and strengthening using handout. (Target 01/22/17)   Time 8   Period Weeks   Status New               Plan - 11/23/16 2315    Clinical Impression Statement Session focused on LTG assessment with patient fully meeting 1 of 5 goals. 3 goals were partially met (improved but not to goal level) and one goal was not met. Today marked pt's return to clinic after being absent since 11/09/16 (she missed 3 appts). It is unclear if she would have performed at goal level had she not missed those appointments, however her absences most certainly interrupted her progress. She has continued to make slow, steady progress with regaining ROM in bil knees and improving her standing tolerance (with 2" block under left foot). Her goal remains to return to walking. She may now be at a point that consideration of a built up shoe may allow her to walk. She should continue to benefit forn PT to address her deficits with recertification period of 8 weeks.    Rehab  Potential Fair   Clinical Impairments Affecting Rehab Potential duration >12 months since onset CVA; left foot drop from back surgery prior to CVA; bil LE contractures; need for assistance to carryover HEP and other mobility education   PT Frequency 2x / week   PT Duration 8 weeks   PT Treatment/Interventions ADLs/Self Care Home Management;DME Instruction;Gait training;Functional mobility training;Therapeutic activities;Therapeutic exercise;Balance training;Neuromuscular re-education;Cognitive remediation;Patient/family education;Orthotic Fit/Training;Wheelchair mobility training;Manual techniques;Moist Heat;Ultrasound;Passive range of motion;Splinting;Taping   PT Next Visit Plan Discuss idea of built up shoe for left shoe (? if we have enough "lifts" to add to her shoe to simulate?), Transfers, transfers, transfers--try these on tile floor (she does not transfer on carpet anywhere at ALF and she cannot pivot on carpet). Continue to work on independence with sequencing in performance of squat pivot vs stand-pivot and sit to stand transfers (done to rollator last visit); improving standing tolerance (try reaching); increasing bilateral LE ROM.    Consulted and Agree with Plan of Care Patient;Family member/caregiver   Family Member Consulted Caregiver      Patient will benefit from skilled therapeutic intervention in order to improve the following deficits and impairments:  Decreased balance, Decreased coordination, Decreased knowledge of use of DME, Decreased mobility, Decreased range of motion, Decreased strength, Difficulty walking, Impaired flexibility, Impaired sensation, Impaired UE functional use, Postural dysfunction, Decreased cognition  Visit Diagnosis: Other symptoms and signs involving the musculoskeletal system  Muscle weakness (generalized)  Unsteadiness  on feet  Other abnormalities of gait and mobility       G-Codes - 23-Dec-2016 1741    Functional Assessment Tool Used (Outpatient  Only) mod assistance to stand limited in part due to L knee flexion contracture of 23 degrees   Functional Limitation Mobility: Walking and moving around   Mobility: Walking and Moving Around Current Status ([id number]) At least 40 percent but less than 60 percent impaired, limited or restricted   Mobility: Walking and Moving Around Goal Status [phone]) At least 40 percent but less than 60 percent impaired, limited or restricted      Problem List Patient Active Problem List   Diagnosis Date Noted  . Transient alteration of awareness   . Labile blood pressure   . Hypertensive urgency 10/27/2016  . Decreased responsiveness   . Bradycardia 10/19/2016  . Hypotension 10/19/2016  . Skull lesion 10/19/2016  . History of cerebrovascular accident (CVA) with residual deficit 10/19/2016  . Personal history of atrial flutter 10/19/2016  . Thoracic aortic atherosclerosis ([place]) 10/19/2016  . Intracranial atherosclerosis 10/19/2016  . Frailty syndrome in geriatric patient 10/19/2016  . Acute kidney injury ([facility]) 10/19/2016  . 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 Syncope, Recurrent 10/18/2016  . Carotid artery disease ([place]) 07/06/2016  . Gait abnormality 05/13/2016  . Cerebrovascular accident (CVA) due to thrombosis of right anterior cerebral artery ([place]) 05/13/2016  . History of heart block 05/07/2016  . Benign labile hypertension 08/21/2015  . Acute CVA (cerebrovascular accident) ([place]) 08/04/2015  . Left hemiparesis ([place]) 08/04/2015  . Primary osteoarthritis of left knee 08/04/2015  . Esophageal reflux 08/04/2015  . Hyperlipidemia LDL goal <70 08/04/2015  . Paroxysmal atrial fibrillation ([place]) 08/04/2015  . PVD (peripheral vascular disease) ([place]) 08/04/2015  . Protein-calorie malnutrition ([place]) 08/04/2015   Physical Therapy Progress Note  Dates of Reporting Period: 09/11/16 to 11/23/16  Objective Reports of Subjective Statement: Patient feels her knee ROM is  improving (and measurements show this). Does not feel her caregivers are doing her transfers any differently (they go so fast)  Objective Measurements: see above  Goal Update: see above; updated today  Plan: In next 4 weeks need to determine if Lt knee ROM will improve no further and if built up shoe will be needed to allow progression to walking.  Reason Skilled Services are Required: Patient has made slow progress (in part due to missed appointments due to medical issues or transportation issues). If she can get to the point she can do standing transfer with a walker, this would dramatically increase her physical activity and safety with transfers (safety for her and her caregivers).     Rexanne Mano, PT 2016-12-23, 5:50 PM  [facility] [address] [place], [place], [address] Phone: [phone]   Fax:  [phone]  Name: Lachae Hohler MRN: [id number] Date of Birth: 27-Sep-1928

## 2016-11-25 ENCOUNTER — Ambulatory Visit: Payer: Medicare HMO | Admitting: Rehabilitative and Restorative Service Providers"

## 2016-11-29 DIAGNOSIS — R69 Illness, unspecified: Secondary | ICD-10-CM | POA: Diagnosis not present

## 2016-11-29 DIAGNOSIS — G4700 Insomnia, unspecified: Secondary | ICD-10-CM | POA: Diagnosis not present

## 2016-11-29 DIAGNOSIS — R451 Restlessness and agitation: Secondary | ICD-10-CM | POA: Diagnosis not present

## 2016-11-29 DIAGNOSIS — F329 Major depressive disorder, single episode, unspecified: Secondary | ICD-10-CM | POA: Diagnosis not present

## 2016-11-29 DIAGNOSIS — F4322 Adjustment disorder with anxiety: Secondary | ICD-10-CM | POA: Diagnosis not present

## 2016-11-30 ENCOUNTER — Encounter: Payer: Self-pay | Admitting: Physical Therapy

## 2016-11-30 ENCOUNTER — Ambulatory Visit: Payer: Medicare HMO | Admitting: Physical Therapy

## 2016-11-30 DIAGNOSIS — R29898 Other symptoms and signs involving the musculoskeletal system: Secondary | ICD-10-CM

## 2016-11-30 DIAGNOSIS — R2681 Unsteadiness on feet: Secondary | ICD-10-CM

## 2016-11-30 DIAGNOSIS — M6281 Muscle weakness (generalized): Secondary | ICD-10-CM

## 2016-11-30 NOTE — Therapy (Signed)
[facility] [address] [place], [place], [address] Phone: [phone]   Fax:  [phone]  Physical Therapy Treatment  Patient Details  Name: Virginia Meyer MRN: [id number] Date of Birth: 1928-04-02 Referring Provider: Sarina Ill, MD  Encounter Date: 11/30/2016      PT End of Session - 11/30/16 1120    Visit Number 21  g-code due vist number 30   Number of Visits 45   Date for PT Re-Evaluation 01/22/17   Authorization Type MCR; G-Code   Authorization Time Period 07/20/16 to 09/17/16; 09/16/16 to 11/15/16; 11/23/16 to 01/22/17   PT Start Time 1017   PT Stop Time 1058   PT Time Calculation (min) 41 min   Equipment Utilized During Treatment Gait belt   Activity Tolerance Patient tolerated treatment well   Behavior During Therapy [facility] for tasks assessed/performed      Past Medical History:  Diagnosis Date  . Anxiety   . Fall   . GERD (gastroesophageal reflux disease)   . Heart failure ([place])   . HLD (hyperlipidemia)   . HTN (hypertension)   . Hyperlipidemia   . Hypotension   . Major depressive disorder   . Neuropathy   . PVD (peripheral vascular disease) ([place])   . Stroke [facility])    left sided deficit  . UTI (urinary tract infection)     Past Surgical History:  Procedure Laterality Date  . ABDOMINAL HYSTERECTOMY    . FOOT NEUROMA SURGERY    . SPINE SURGERY    . TONSILLECTOMY      There were no vitals filed for this visit.      Subjective Assessment - 11/30/16 1022    Subjective No new complaints. No falls.  Reports her transportation came at the wrong time for her last visit.    Patient is accompained by: Family member  caregiver   Pertinent History hospitalized with CHB 04/2016, afib, HTN, peripheral neuropathy, CHF, PVD, 05/27/15 large R MCA CVA, lt foot drop due to back sx 2015; *hospitalized 8/27-8/30 and 9/5-9/7 for syncope   Limitations Standing;Walking   How long can you stand comfortably?  unable   How long can you walk comfortably? unable; self-propels her w/c with bil LEs and RUE   Patient Stated Goals she wants to learn to stand and walk again.   Currently in Pain? No/denies                         [facility] Adult PT Treatment/Exercise - 11/30/16 1033      Transfers   Transfers Sit to Stand;Stand to Sit   Sit to Stand 3: Mod assist;With upper extremity assist;From chair/3-in-1;Uncontrolled descent;2: Max assist   Stand to Sit 3: Mod assist   Stand to Sit Details cues to reach back for chair (difficult in // bars due to spacing)     Ambulation/Gait   Ambulation/Gait Yes   Ambulation/Gait Assistance 3: Mod assist   Ambulation/Gait Assistance Details iin // bars with clinic "built up shoe" (black 3/4" inside "cast shoe"). pt able to advance LLE (with built up shoe), however required assist with placement at times (appeared more due to imbalance and quickly placing foot than a coordination issue). Pt advancing RLE with min-mod support to LLE to prevent buckling.    Ambulation Distance (Feet) 8 Feet  seated rest, 5 ft   Assistive device Parallel bars   Gait Pattern Step-through pattern;Decreased step length - right;Decreased step length - left;Decreased  hip/knee flexion - right;Decreased hip/knee flexion - left;Right flexed knee in stance;Left flexed knee in stance;Lateral trunk lean to left   Ambulation Surface Level     Posture/Postural Control   Posture/Postural Control Postural limitations   Posture Comments in standing working on hips forward over her BOS, shoulders upright an over her hips     Static Standing Balance   Static Standing - Balance Support Bilateral upper extremity supported   Static Standing - Level of Assistance 4: Min assist   Static Standing - Comment/# of Minutes "built up shoe" on LLE; x 4 minutes     Knee/Hip Exercises: Aerobic   Other Aerobic Sci-Fit sitting in wheelchair x 3 minutes L2.5 with legs only                 PT Education - 11/30/16 1118    Education provided Yes   Education Details purpose of built up shoe for her LLE   Person(s) Educated Patient   Methods Explanation;Demonstration   Comprehension Verbalized understanding;Returned demonstration;Need further instruction          PT Short Term Goals - 11/24/16 1728      PT SHORT TERM GOAL #1   Title Patient will either improve Lt knee extension to -15 degrees or will have consulted orthotist to obtain built up shoe to allow progression to walking. (Target for all STGs 12/23/16)   Baseline 10/02  Lt knee extension -23    Time 4   Period Weeks   Status Revised   Target Date 12/23/16     PT SHORT TERM GOAL #2   Title Patient will transfer wheelchair to/from bed with minimal assistance on tile flooring at least 50% of the attempted transfers.     Baseline 8/23 mod-max assist    Time 4   Period Weeks   Status Revised     PT SHORT TERM GOAL #3   Title Patient will sit to stand to rollator with up to mod assist and perform pre-gait tasks with up to min assist x 3 minutes.    Time 4   Period Weeks   Status Revised           PT Long Term Goals - 11/23/16 2308      PT LONG TERM GOAL #1   Title Patient and caregiver/family will demonstrate independence with updated HEP  (11/23/16 see updated goal below)   Time 8   Period Weeks   Status On-going  Patient with many missed appt recently due to transportation issues     PT LONG TERM GOAL #3   Title Patient will perform sit to stand with LRAD with minimal assistance. (Target 01/22/17)   Baseline Added 11/23/16   Time 8   Period Weeks   Status Revised     PT LONG TERM GOAL #4   Title Patient will ambulate 10 ft with LRAD and up to moderate assistance and chair to follow.    Time 8   Period Weeks   Status New  due to lack of time; shortened session going to restroom     PT LONG TERM GOAL #5   Title Patient will have a method to educate her caregivers at the ALF on the best  method to help her transfer (ex. laminated sheet).    Baseline Patient has been unable to remember sequence for transfer. 10/02 No successful method developed (pt forgets to verbally instruct them in how to help her; pt and aide report the staff "move  so fast, they don't slow down to look at pictures or ask quesions.   Time 8   Period Weeks   Status Not Met     PT LONG TERM GOAL #6   Title Patient will stand with LRAD with mod assist.    Baseline 10/02 stood to rollator with mod assist, and able to maintain standing with up to min assist   Time 8   Period Weeks   Status Achieved     PT LONG TERM GOAL #7   Title Patient will perform pre-gait/balance activities x 5 minutes (no step under left foot) with LRAD. (11/23/16 goal updated/replaced above--no date extension)   Baseline 10/02 can do 5 minutes WITH 2" step under LLE in // bars   Time 8   Period Weeks   Status Partially Met     PT LONG TERM GOAL #8   Title Patient will be independent with her updated HEP for stretching and strengthening using handout. (Target 01/22/17)   Time 8   Period Weeks   Status New               Plan - 11/30/16 1121    Clinical Impression Statement Session focused on standing balance, pre-gait/gait training, and strength training. With utilizing clinic "built up shoe" for LLE, pt was able to ambulate in // bars x 8 ft and then 5 ft. Patient interested in pursuing a built up shoe for LLE. Anticipate continued progress towards goals.    Rehab Potential Fair   Clinical Impairments Affecting Rehab Potential duration >12 months since onset CVA; left foot drop from back surgery prior to CVA; bil LE contractures; need for assistance to carryover HEP and other mobility education   PT Frequency 2x / week   PT Duration 8 weeks   PT Treatment/Interventions ADLs/Self Care Home Management;DME Instruction;Gait training;Functional mobility training;Therapeutic activities;Therapeutic exercise;Balance  training;Neuromuscular re-education;Cognitive remediation;Patient/family education;Orthotic Fit/Training;Wheelchair mobility training;Manual techniques;Moist Heat;Ultrasound;Passive range of motion;Splinting;Taping   PT Next Visit Plan Transfers, transfers, transfers--try these on tile floor (she does not transfer on carpet anywhere at ALF and she cannot pivot on carpet). Continue to work on independence with sequencing in performance of squat pivot vs stand-pivot and sit to stand transfers; walking in // bars with clinic "built up shoe" on left with black insert into cast shoe and donned over her shoe. increasing bilateral LE ROM.    Consulted and Agree with Plan of Care Patient;Family member/caregiver   Family Member Consulted Caregiver      Patient will benefit from skilled therapeutic intervention in order to improve the following deficits and impairments:  Decreased balance, Decreased coordination, Decreased knowledge of use of DME, Decreased mobility, Decreased range of motion, Decreased strength, Difficulty walking, Impaired flexibility, Impaired sensation, Impaired UE functional use, Postural dysfunction, Decreased cognition  Visit Diagnosis: Muscle weakness (generalized)  Unsteadiness on feet  Other symptoms and signs involving the musculoskeletal system     Problem List Patient Active Problem List   Diagnosis Date Noted  . Transient alteration of awareness   . Labile blood pressure   . Hypertensive urgency 10/27/2016  . Decreased responsiveness   . Bradycardia 10/19/2016  . Hypotension 10/19/2016  . Skull lesion 10/19/2016  . History of cerebrovascular accident (CVA) with residual deficit 10/19/2016  . Personal history of atrial flutter 10/19/2016  . Thoracic aortic atherosclerosis ([place]) 10/19/2016  . Intracranial atherosclerosis 10/19/2016  . Frailty syndrome in geriatric patient 10/19/2016  . Acute kidney injury ([place]) 10/19/2016  . Old Cerebral infarction  involving  cerebellar artery, bilateral   . Syncope, Recurrent 10/18/2016  . Carotid artery disease ([place]) 07/06/2016  . Gait abnormality 05/13/2016  . Cerebrovascular accident (CVA) due to thrombosis of right anterior cerebral artery ([place]) 05/13/2016  . History of heart block 05/07/2016  . Benign labile hypertension 08/21/2015  . Acute CVA (cerebrovascular accident) ([place]) 08/04/2015  . Left hemiparesis ([place]) 08/04/2015  . Primary osteoarthritis of left knee 08/04/2015  . Esophageal reflux 08/04/2015  . Hyperlipidemia LDL goal <70 08/04/2015  . Paroxysmal atrial fibrillation ([place]) 08/04/2015  . PVD (peripheral vascular disease) ([place]) 08/04/2015  . Protein-calorie malnutrition ([place]) 08/04/2015    Rexanne Mano, PT 11/30/2016, 11:28 AM  [facility] [address] [place], [place], [address] Phone: [phone]   Fax:  [phone]  Name: Claris Pech MRN: [id number] Date of Birth: December 26, 1928

## 2016-12-02 ENCOUNTER — Encounter: Payer: Self-pay | Admitting: Physical Therapy

## 2016-12-02 ENCOUNTER — Ambulatory Visit: Payer: Medicare HMO | Admitting: Physical Therapy

## 2016-12-02 DIAGNOSIS — R2689 Other abnormalities of gait and mobility: Secondary | ICD-10-CM

## 2016-12-02 DIAGNOSIS — R29898 Other symptoms and signs involving the musculoskeletal system: Secondary | ICD-10-CM

## 2016-12-02 DIAGNOSIS — M6281 Muscle weakness (generalized): Secondary | ICD-10-CM

## 2016-12-02 NOTE — Therapy (Signed)
[facility] [address] [place], [place], [address] Phone: [phone]   Fax:  [phone]  Physical Therapy Treatment  Patient Details  Name: Virginia Meyer MRN: [id number] Date of Birth: 09/13/1928 Referring Provider: Antonia Ahern, MD  Encounter Date: 12/02/2016      PT End of Session - 12/02/16 1040    Visit Number 22  g-code due vist number [id number]   Number of Visits 45   Date for PT Re-Evaluation 01/22/17   Authorization Type MCR; G-Code   Authorization Time Period 07/20/16 to 09/17/16; 09/16/16 to 11/15/16; 11/23/16 to 01/22/17   PT Start Time 0932   PT Stop Time 1014   PT Time Calculation (min) 42 min   Equipment Utilized During Treatment Gait belt   Activity Tolerance Patient tolerated treatment well   Behavior During Therapy WFL for tasks assessed/performed      Past Medical History:  Diagnosis Date  . Anxiety   . Fall   . GERD (gastroesophageal reflux disease)   . Heart failure (HCC)   . HLD (hyperlipidemia)   . HTN (hypertension)   . Hyperlipidemia   . Hypotension   . Major depressive disorder   . Neuropathy   . PVD (peripheral vascular disease) (HCC)   . Stroke (HCC)    left sided deficit  . UTI (urinary tract infection)     Past Surgical History:  Procedure Laterality Date  . ABDOMINAL HYSTERECTOMY    . FOOT NEUROMA SURGERY    . SPINE SURGERY    . TONSILLECTOMY      There were no vitals filed for this visit.      Subjective Assessment - 12/02/16 1022    Subjective No new complaints. No falls.  Reports her right knee pain remains increased compared to normal. States it gets twisted when caregivers transfer her.   Patient is accompained by: Family member  caregiver   Pertinent History hospitalized with CHB 04/2016, afib, HTN, peripheral neuropathy, CHF, PVD, 05/27/15 large R MCA CVA, lt foot drop due to back sx 2015; *hospitalized 8/27-8/30 and 9/5-9/7 for syncope   Limitations  Standing;Walking   How long can you stand comfortably? unable   How long can you walk comfortably? unable; self-propels her w/c with bil LEs and RUE   Patient Stated Goals she wants to learn to stand and walk again.   Currently in Pain? Yes   Pain Score 4    Pain Location Knee   Pain Orientation Right   Pain Descriptors / Indicators Aching   Pain Type Chronic pain   Pain Onset More than a month ago   Pain Frequency Constant                         [facility] Adult PT Treatment/Exercise - 12/02/16 1024      Transfers   Transfers Sit to Stand;Stand to Sit;Stand Pivot Transfers;Squat Pivot Transfers   Sit to Stand 3: Mod assist;With upper extremity assist;From chair/3-in-1;Uncontrolled descent;2: Max assist;With armrests;From bed   Stand to Sit 3: Mod assist   Stand Pivot Transfers 2: Max assist   Stand Pivot Transfer Details (indicate cue type and reason) attempted, came to stand, however could not transfer weight between feet or onto ball of foot for pivot and returned to sitting.    Squat Pivot Transfers 3: Mod assist;With upper extremity assistance   Squat Pivot Transfer Details (indicate cue type and reason) Worked on anterior wt-shift with pt's hands on   PT's shoulders and "pushing PT away" with much improved anterior wt-shift and ability to lift hips(x5); then progressed to squat pivot 1/2 way to chair and 2nd squat-pivot onto bed; return to w/c with 2 squat-pivots and reseting feet mid-way     Ambulation/Gait   Ambulation/Gait Yes   Ambulation/Gait Assistance 3: Mod assist   Ambulation/Gait Assistance Details in // bars working on taking weight on LLE iwth built-up shoe; pt losing balance/leaning to the left vs upright wt-shift    Ambulation Distance (Feet) --  6 steps total in 2 attempts   Assistive device Parallel bars   Gait Pattern Step-through pattern;Decreased step length - right;Decreased step length - left;Decreased hip/knee flexion - right;Decreased hip/knee  flexion - left;Right flexed knee in stance;Left flexed knee in stance;Lateral trunk lean to left   Ambulation Surface Level     Posture/Postural Control   Posture/Postural Control Postural limitations   Posture Comments in standing working on hips forward over her BOS, shoulders upright an over her hips     Static Standing Balance   Static Standing - Balance Support Bilateral upper extremity supported   Static Standing - Level of Assistance 4: Min assist   Static Standing - Comment/# of Minutes "built up shoe" on LLE; x 1 minutes     Knee/Hip Exercises: Aerobic   Other Aerobic Sci-Fit sitting in wheelchair x 3 minutes L2.0 with legs only for warm-up of bil knees prior to standing and transfers                PT Education - 12/02/16 1040    Education provided Yes   Education Details how and why to shift anteriorly over BOS with transfers   Person(s) Educated Patient   Methods Explanation;Demonstration;Tactile cues;Verbal cues   Comprehension Verbalized understanding;Returned demonstration;Verbal cues required;Tactile cues required;Need further instruction          PT Short Term Goals - 11/24/16 1728      PT SHORT TERM GOAL #1   Title Patient will either improve Lt knee extension to -15 degrees or will have consulted orthotist to obtain built up shoe to allow progression to walking. (Target for all STGs 12/23/16)   Baseline 10/02  Lt knee extension -23    Time 4   Period Weeks   Status Revised   Target Date 12/23/16     PT SHORT TERM GOAL #2   Title Patient will transfer wheelchair to/from bed with minimal assistance on tile flooring at least 50% of the attempted transfers.     Baseline 8/23 mod-max assist    Time 4   Period Weeks   Status Revised     PT SHORT TERM GOAL #3   Title Patient will sit to stand to rollator with up to mod assist and perform pre-gait tasks with up to min assist x 3 minutes.    Time 4   Period Weeks   Status Revised           PT  Long Term Goals - 11/23/16 2308      PT LONG TERM GOAL #1   Title Patient and caregiver/family will demonstrate independence with updated HEP  (11/23/16 see updated goal below)   Time 8   Period Weeks   Status On-going  Patient with many missed appt recently due to transportation issues     PT LONG TERM GOAL #3   Title Patient will perform sit to stand with LRAD with minimal assistance. (Target 01/22/17)   Baseline Added 11/23/16  Time 8   Period Weeks   Status Revised     PT LONG TERM GOAL #4   Title Patient will ambulate 10 ft with LRAD and up to moderate assistance and chair to follow.    Time 8   Period Weeks   Status New  due to lack of time; shortened session going to restroom     PT LONG TERM GOAL #5   Title Patient will have a method to educate her caregivers at the ALF on the best method to help her transfer (ex. laminated sheet).    Baseline Patient has been unable to remember sequence for transfer. 10/02 No successful method developed (pt forgets to verbally instruct them in how to help her; pt and aide report the staff "move so fast, they don't slow down to look at pictures or ask quesions.   Time 8   Period Weeks   Status Not Met     PT LONG TERM GOAL #6   Title Patient will stand with LRAD with mod assist.    Baseline 10/02 stood to rollator with mod assist, and able to maintain standing with up to min assist   Time 8   Period Weeks   Status Achieved     PT LONG TERM GOAL #7   Title Patient will perform pre-gait/balance activities x 5 minutes (no step under left foot) with LRAD. (11/23/16 goal updated/replaced above--no date extension)   Baseline 10/02 can do 5 minutes WITH 2" step under LLE in // bars   Time 8   Period Weeks   Status Partially Met     PT LONG TERM GOAL #8   Title Patient will be independent with her updated HEP for stretching and strengthening using handout. (Target 01/22/17)   Time 8   Period Weeks   Status New                Plan - 12/02/16 1042    Clinical Impression Statement Session focused on transfers, pre-gait, and gait training. Patient reporting rt knee pain (not as much as the other day, but more than usual). She was better able to state the steps of the transfer, however continues to need much work on anterior weight-shift. No progress noted this date; more limited due to right knee pain.    Rehab Potential Fair   Clinical Impairments Affecting Rehab Potential duration >12 months since onset CVA; left foot drop from back surgery prior to CVA; bil LE contractures; need for assistance to carryover HEP and other mobility education   PT Frequency 2x / week   PT Duration 8 weeks   PT Treatment/Interventions ADLs/Self Care Home Management;DME Instruction;Gait training;Functional mobility training;Therapeutic activities;Therapeutic exercise;Balance training;Neuromuscular re-education;Cognitive remediation;Patient/family education;Orthotic Fit/Training;Wheelchair mobility training;Manual techniques;Moist Heat;Ultrasound;Passive range of motion;Splinting;Taping   PT Next Visit Plan Transfers, transfers, transfers--try these on tile floor (she does not transfer on carpet anywhere at ALF and she cannot pivot on carpet). Continue to work on independence with sequencing in performance of squat pivot vs stand-pivot and sit to stand transfers; pre-gait and walking in // bars with clinic "built up shoe" on left with black insert into cast shoe and donned over her shoe. increasing bilateral LE ROM.    Consulted and Agree with Plan of Care Patient;Family member/caregiver   Family Member Consulted Caregiver      Patient will benefit from skilled therapeutic intervention in order to improve the following deficits and impairments:  Decreased balance, Decreased coordination, Decreased knowledge of use of DME,   Decreased mobility, Decreased range of motion, Decreased strength, Difficulty walking, Impaired flexibility, Impaired  sensation, Impaired UE functional use, Postural dysfunction, Decreased cognition  Visit Diagnosis: Muscle weakness (generalized)  Other symptoms and signs involving the musculoskeletal system  Other abnormalities of gait and mobility     Problem List Patient Active Problem List   Diagnosis Date Noted  . Transient alteration of awareness   . Labile blood pressure   . Hypertensive urgency 10/27/2016  . Decreased responsiveness   . Bradycardia 10/19/2016  . Hypotension 10/19/2016  . Skull lesion 10/19/2016  . History of cerebrovascular accident (CVA) with residual deficit 10/19/2016  . Personal history of atrial flutter 10/19/2016  . Thoracic aortic atherosclerosis ([place]) 10/19/2016  . Intracranial atherosclerosis 10/19/2016  . Frailty syndrome in geriatric patient 10/19/2016  . Acute kidney injury ([place]) 10/19/2016  . 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 Syncope, Recurrent 10/18/2016  . Carotid artery disease ([place]) 07/06/2016  . Gait abnormality 05/13/2016  . Cerebrovascular accident (CVA) due to thrombosis of right anterior cerebral artery ([place]) 05/13/2016  . History of heart block 05/07/2016  . Benign labile hypertension 08/21/2015  . Acute CVA (cerebrovascular accident) ([place]) 08/04/2015  . Left hemiparesis ([place]) 08/04/2015  . Primary osteoarthritis of left knee 08/04/2015  . Esophageal reflux 08/04/2015  . Hyperlipidemia LDL goal <70 08/04/2015  . Paroxysmal atrial fibrillation ([place]) 08/04/2015  . PVD (peripheral vascular disease) ([place]) 08/04/2015  . Protein-calorie malnutrition ([place]) 08/04/2015    Rexanne Mano, PT 12/02/2016, 10:47 AM  [facility] [address] [place], [place], [address] Phone: [phone]   Fax:  [phone]  Name: Valery Amedee MRN: [id number] Date of Birth: 26-Feb-1928

## 2016-12-07 ENCOUNTER — Ambulatory Visit: Payer: Medicare HMO | Admitting: Physical Therapy

## 2016-12-07 DIAGNOSIS — D2372 Other benign neoplasm of skin of left lower limb, including hip: Secondary | ICD-10-CM | POA: Diagnosis not present

## 2016-12-07 DIAGNOSIS — L570 Actinic keratosis: Secondary | ICD-10-CM | POA: Diagnosis not present

## 2016-12-07 DIAGNOSIS — D485 Neoplasm of uncertain behavior of skin: Secondary | ICD-10-CM | POA: Diagnosis not present

## 2016-12-07 DIAGNOSIS — L308 Other specified dermatitis: Secondary | ICD-10-CM | POA: Diagnosis not present

## 2016-12-07 DIAGNOSIS — C44329 Squamous cell carcinoma of skin of other parts of face: Secondary | ICD-10-CM | POA: Diagnosis not present

## 2016-12-09 ENCOUNTER — Ambulatory Visit: Payer: Medicare HMO | Admitting: Physical Therapy

## 2016-12-09 ENCOUNTER — Ambulatory Visit: Payer: Medicare HMO

## 2016-12-09 DIAGNOSIS — M6281 Muscle weakness (generalized): Secondary | ICD-10-CM

## 2016-12-09 DIAGNOSIS — R29898 Other symptoms and signs involving the musculoskeletal system: Secondary | ICD-10-CM

## 2016-12-09 DIAGNOSIS — R2689 Other abnormalities of gait and mobility: Secondary | ICD-10-CM

## 2016-12-09 NOTE — Patient Instructions (Signed)
KNEE: Extension, Long Arc Quads - Sitting    Sit up tall. Raise right leg until knee is straight, then slowly lower leg back down. Repeat with left leg. _10__ reps per set, _2__ sets per day, _3__ days per week  Copyright  VHI. All rights reserved.   ABDUCTION: Sitting (Active)    Sit with feet flat, sit up tall. Tie red theraband just above knees. Lift right leg slightly and draw it out to side. Then bring it back to starting position. Repeat with left leg.  Complete __2_ sets of _10__ repetitions. Perform _3__ sessions per week.  Copyright  VHI. All rights reserved.    Scapular retraction: Sit up tall in your chair, tuck elbows at your side and squeeze shoulder blades together (bring elbows back). Then relax. Perform 2 sets of 10 reps. 3 times a week.    Cervical retraction: Sit up tall in chair, either with caregiver or with pillow behind head. Tuck chin and push head into caregiver's hand or into pillow on wall. Hold for 2-3 seconds, then relax.  Perform x10 reps daily.

## 2016-12-09 NOTE — Therapy (Signed)
[facility] [address] [place], [place], [address] Phone: [phone]   Fax:  [phone]  Physical Therapy Treatment  Patient Details  Name: Virginia Meyer MRN: [id number] Date of Birth: 01/20/1929 Referring Provider: Sarina Ill, MD  Encounter Date: 12/09/2016      PT End of Session - 12/09/16 1121    Visit Number 23   Number of Visits 45   Date for PT Re-Evaluation 01/22/17   Authorization Type MCR; G-Code   Authorization Time Period 07/20/16 to 09/17/16; 09/16/16 to 11/15/16; 11/23/16 to 01/22/17   PT Start Time 1018   PT Stop Time 1100   PT Time Calculation (min) 42 min   Activity Tolerance Patient tolerated treatment well   Behavior During Therapy [facility] for tasks assessed/performed      Past Medical History:  Diagnosis Date  . Anxiety   . Fall   . GERD (gastroesophageal reflux disease)   . Heart failure ([place])   . HLD (hyperlipidemia)   . HTN (hypertension)   . Hyperlipidemia   . Hypotension   . Major depressive disorder   . Neuropathy   . PVD (peripheral vascular disease) ([place])   . Stroke [facility])    left sided deficit  . UTI (urinary tract infection)     Past Surgical History:  Procedure Laterality Date  . ABDOMINAL HYSTERECTOMY    . FOOT NEUROMA SURGERY    . SPINE SURGERY    . TONSILLECTOMY      There were no vitals filed for this visit.      Subjective Assessment - 12/09/16 1022    Subjective Pt reported she fell on Tuesday, when she reached to get her purse off the floor (she was in w/c). Pt denied injury and did not see MD. Caregiver confirmed fall and reported pt's bandage over R eye is from removal of skin CA lesion not from fall. Pt reports she perform LLE hamstring stretch with LLE on stool for 1 hour, otherwise, supine HEP hard to perform as pt spends most time in w/c.   Patient is accompained by: --  caregiver: Baker Janus   Pertinent History hospitalized with CHB 04/2016, afib, HTN,  peripheral neuropathy, CHF, PVD, 05/27/15 large R MCA CVA, lt foot drop due to back sx 2015; *hospitalized 8/27-8/30 and 9/5-9/7 for syncope   Patient Stated Goals she wants to learn to stand and walk again.   Currently in Pain? No/denies                 Therex:  Performed seated in w/c with cues and demo for all exercises. Performed with S to ensure safety. Pt denied pain during session. Please see pt instructions for HEP details. Pt perform B hip abduction 2x10 reps no band and x10 reps with red theraband, otherwise, HEP instructions indicate all exercises performed during session.                PT Education - 12/09/16 1121    Education provided Yes   Education Details PT added seated postural and strengthening exercises to HEP.    Person(s) Educated Patient;Caregiver(s)   Methods Explanation;Demonstration;Tactile cues;Verbal cues;Handout   Comprehension Verbalized understanding;Returned demonstration;Need further instruction          PT Short Term Goals - 11/24/16 1728      PT SHORT TERM GOAL #1   Title Patient will either improve Lt knee extension to -15 degrees or will have consulted orthotist to obtain built up shoe to allow progression  to walking. (Target for all STGs 12/23/16)   Baseline 10/02  Lt knee extension -23    Time 4   Period Weeks   Status Revised   Target Date 12/23/16     PT SHORT TERM GOAL #2   Title Patient will transfer wheelchair to/from bed with minimal assistance on tile flooring at least 50% of the attempted transfers.     Baseline 8/23 mod-max assist    Time 4   Period Weeks   Status Revised     PT SHORT TERM GOAL #3   Title Patient will sit to stand to rollator with up to mod assist and perform pre-gait tasks with up to min assist x 3 minutes.    Time 4   Period Weeks   Status Revised           PT Long Term Goals - 11/23/16 2308      PT LONG TERM GOAL #1   Title Patient and caregiver/family will demonstrate  independence with updated HEP  (11/23/16 see updated goal below)   Time 8   Period Weeks   Status On-going  Patient with many missed appt recently due to transportation issues     PT LONG TERM GOAL #3   Title Patient will perform sit to stand with LRAD with minimal assistance. (Target 01/22/17)   Baseline Added 11/23/16   Time 8   Period Weeks   Status Revised     PT LONG TERM GOAL #4   Title Patient will ambulate 10 ft with LRAD and up to moderate assistance and chair to follow.    Time 8   Period Weeks   Status New  due to lack of time; shortened session going to restroom     PT LONG TERM GOAL #5   Title Patient will have a method to educate her caregivers at the ALF on the best method to help her transfer (ex. laminated sheet).    Baseline Patient has been unable to remember sequence for transfer. 10/02 No successful method developed (pt forgets to verbally instruct them in how to help her; pt and aide report the staff "move so fast, they don't slow down to look at pictures or ask quesions.   Time 8   Period Weeks   Status Not Met     PT LONG TERM GOAL #6   Title Patient will stand with LRAD with mod assist.    Baseline 10/02 stood to rollator with mod assist, and able to maintain standing with up to min assist   Time 8   Period Weeks   Status Achieved     PT LONG TERM GOAL #7   Title Patient will perform pre-gait/balance activities x 5 minutes (no step under left foot) with LRAD. (11/23/16 goal updated/replaced above--no date extension)   Baseline 10/02 can do 5 minutes WITH 2" step under LLE in // bars   Time 8   Period Weeks   Status Partially Met     PT LONG TERM GOAL #8   Title Patient will be independent with her updated HEP for stretching and strengthening using handout. (Target 01/22/17)   Time 8   Period Weeks   Status New               Plan - 12/09/16 1121    Clinical Impression Statement Skilled session focused on postural exercises and BLE  strengthening exercises in seated position, as pt spends most of her day seated in w/c. Pt required  cues and demo for technique but tolerated HEP well. Pt would continue to benefit from skilled PT to improve safety during functional mobility.    Rehab Potential Fair   Clinical Impairments Affecting Rehab Potential duration >12 months since onset CVA; left foot drop from back surgery prior to CVA; bil LE contractures; need for assistance to carryover HEP and other mobility education   PT Frequency 2x / week   PT Duration 8 weeks   PT Treatment/Interventions ADLs/Self Care Home Management;DME Instruction;Gait training;Functional mobility training;Therapeutic activities;Therapeutic exercise;Balance training;Neuromuscular re-education;Cognitive remediation;Patient/family education;Orthotic Fit/Training;Wheelchair mobility training;Manual techniques;Moist Heat;Ultrasound;Passive range of motion;Splinting;Taping   PT Next Visit Plan Transfers, transfers, transfers--try these on tile floor (she does not transfer on carpet anywhere at ALF and she cannot pivot on carpet). Continue to work on independence with sequencing in performance of squat pivot vs stand-pivot and sit to stand transfers; pre-gait and walking in // bars with clinic "built up shoe" on left with black insert into cast shoe and donned over her shoe. increasing bilateral LE ROM.    Consulted and Agree with Plan of Care Patient;Family member/caregiver   Family Member Consulted Caregiver      Patient will benefit from skilled therapeutic intervention in order to improve the following deficits and impairments:  Decreased balance, Decreased coordination, Decreased knowledge of use of DME, Decreased mobility, Decreased range of motion, Decreased strength, Difficulty walking, Impaired flexibility, Impaired sensation, Impaired UE functional use, Postural dysfunction, Decreased cognition  Visit Diagnosis: Muscle weakness (generalized)  Other  abnormalities of gait and mobility  Other symptoms and signs involving the musculoskeletal system     Problem List Patient Active Problem List   Diagnosis Date Noted  . Transient alteration of awareness   . Labile blood pressure   . Hypertensive urgency 10/27/2016  . Decreased responsiveness   . Bradycardia 10/19/2016  . Hypotension 10/19/2016  . Skull lesion 10/19/2016  . History of cerebrovascular accident (CVA) with residual deficit 10/19/2016  . Personal history of atrial flutter 10/19/2016  . Thoracic aortic atherosclerosis ([place]) 10/19/2016  . Intracranial atherosclerosis 10/19/2016  . Frailty syndrome in geriatric patient 10/19/2016  . Acute kidney injury ([place]) 10/19/2016  . 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 Syncope, Recurrent 10/18/2016  . Carotid artery disease ([place]) 07/06/2016  . Gait abnormality 05/13/2016  . Cerebrovascular accident (CVA) due to thrombosis of right anterior cerebral artery ([place]) 05/13/2016  . History of heart block 05/07/2016  . Benign labile hypertension 08/21/2015  . Acute CVA (cerebrovascular accident) ([place]) 08/04/2015  . Left hemiparesis ([place]) 08/04/2015  . Primary osteoarthritis of left knee 08/04/2015  . Esophageal reflux 08/04/2015  . Hyperlipidemia LDL goal <70 08/04/2015  . Paroxysmal atrial fibrillation ([place]) 08/04/2015  . PVD (peripheral vascular disease) ([place]) 08/04/2015  . Protein-calorie malnutrition ([place]) 08/04/2015    Virginia Meyer L 12/09/2016, 11:23 AM  [facility] [address] [place], [place], [address] Phone: [phone]   Fax:  [phone]  Name: Earlyne Feeser MRN: [id number] Date of Birth: [birth year]  Geoffry Paradise, PT,DPT 12/09/16 11:26 AM Phone: [phone] Fax: [phone]

## 2016-12-13 ENCOUNTER — Other Ambulatory Visit: Payer: Self-pay | Admitting: *Deleted

## 2016-12-13 DIAGNOSIS — I6523 Occlusion and stenosis of bilateral carotid arteries: Secondary | ICD-10-CM

## 2016-12-14 ENCOUNTER — Ambulatory Visit: Payer: Medicare HMO | Admitting: Physical Therapy

## 2016-12-16 ENCOUNTER — Ambulatory Visit: Payer: Medicare HMO | Admitting: Physical Therapy

## 2016-12-16 ENCOUNTER — Encounter: Payer: Self-pay | Admitting: Physical Therapy

## 2016-12-16 DIAGNOSIS — R29898 Other symptoms and signs involving the musculoskeletal system: Secondary | ICD-10-CM | POA: Diagnosis not present

## 2016-12-16 DIAGNOSIS — R2689 Other abnormalities of gait and mobility: Secondary | ICD-10-CM

## 2016-12-16 DIAGNOSIS — M6281 Muscle weakness (generalized): Secondary | ICD-10-CM

## 2016-12-16 NOTE — Therapy (Signed)
[facility] [address] [place], [place], [address] Phone: [phone]   Fax:  [phone]  Physical Therapy Treatment  Patient Details  Name: Virginia Meyer MRN: [id number] Date of Birth: [birth year] Referring Provider: Sarina Ill, MD  Encounter Date: 12/16/2016      PT End of Session - 12/16/16 1109    Visit Number 24   Number of Visits 45   Date for PT Re-Evaluation 01/22/17   Authorization Type MCR; G-Code   Authorization Time Period 07/20/16 to 09/17/16; 09/16/16 to 11/15/16; 11/23/16 to 01/22/17   PT Start Time 0930   PT Stop Time 1013   PT Time Calculation (min) 43 min   Equipment Utilized During Treatment Gait belt   Activity Tolerance Patient tolerated treatment well   Behavior During Therapy [facility] for tasks assessed/performed      Past Medical History:  Diagnosis Date  . Anxiety   . Fall   . GERD (gastroesophageal reflux disease)   . Heart failure ([place])   . HLD (hyperlipidemia)   . HTN (hypertension)   . Hyperlipidemia   . Hypotension   . Major depressive disorder   . Neuropathy   . PVD (peripheral vascular disease) ([place])   . Stroke [facility])    left sided deficit  . UTI (urinary tract infection)     Past Surgical History:  Procedure Laterality Date  . ABDOMINAL HYSTERECTOMY    . FOOT NEUROMA SURGERY    . SPINE SURGERY    . TONSILLECTOMY      There were no vitals filed for this visit.      Subjective Assessment - 12/16/16 1026    Subjective Pt states that her knees were sore yesterday, but they feel fine today. No new complaints.   Patient is accompained by: Family member   Pertinent History hospitalized with CHB 04/2016, afib, HTN, peripheral neuropathy, CHF, PVD, 05/27/15 large R MCA CVA, lt foot drop due to back sx 2015; *hospitalized 8/27-8/30 and 9/5-9/7 for syncope   Limitations Standing;Walking   How long can you stand comfortably? unable   How long can you walk comfortably?  unable; self-propels her w/c with bil LEs and RUE   Patient Stated Goals she wants to learn to stand and walk again.   Currently in Pain? Yes   Pain Score 4    Pain Location Foot   Pain Orientation Left   Pain Descriptors / Indicators Aching   Pain Type Chronic pain   Pain Frequency Constant   Aggravating Factors  pressure (pain due to neuropathy)          [facility] Adult PT Treatment/Exercise - 12/16/16 0001      Transfers   Transfers Sit to Stand;Stand to Sit;Squat Pivot Transfers;Lateral/Scoot Transfers   Sit to Stand Details Tactile cues for weight shifting;Tactile cues for sequencing;Verbal cues for sequencing;Verbal cues for technique;Manual facilitation for weight shifting;Manual facilitation for placement;Manual facilitation for weight bearing   Stand to Sit Details (indicate cue type and reason) Tactile cues for sequencing;Tactile cues for weight shifting;Verbal cues for sequencing;Verbal cues for technique;Manual facilitation for weight shifting;Manual facilitation for placement;Manual facilitation for weight bearing   Comments performed sit<>stand x3 reps with cues/assistance each time. in standing- mod/max assist with cues on trunk extension/posture. faciliation provided at pelvis and knees with cues for pt to bring shoulders back. once in upright posture worked on lateral weight shifting, then in midline working on alternating UE raises.      Exercises   Other Exercises  Scapular retraction squeezing shoulder blades x10, 1 set; Cervical retraction pushing head into therapist hand x10, 1 set     Knee/Hip Exercises: Stretches   Passive Hamstring Stretch Both;2 reps;60 seconds   Passive Hamstring Stretch Limitations Pt limited with Rt and Lt knee ext. Performed concurrently with heel cord stretch via single UE overpressure at knee for 30 seconds each leg x2 sets; then performed hamstring stretch with both hands for prolonged overpressure for 60 seconds x2 sets.   Gastroc Stretch --      Knee/Hip Exercises: Seated   Long Arc Quad Right;Left;10 reps;1 set;AROM   Abduction/Adduction  Right;Left;1 set;10 reps;AROM     Knee/Hip Exercises: Supine   Short Arc Quad Sets Right;Left;1 set;10 reps  Bolster under knees   Straight Leg Raises Right;Left;1 set;10 reps             PT Short Term Goals - 11/24/16 1728      PT SHORT TERM GOAL #1   Title Patient will either improve Lt knee extension to -15 degrees or will have consulted orthotist to obtain built up shoe to allow progression to walking. (Target for all STGs 12/23/16)   Baseline 10/02  Lt knee extension -23    Time 4   Period Weeks   Status Revised   Target Date 12/23/16     PT SHORT TERM GOAL #2   Title Patient will transfer wheelchair to/from bed with minimal assistance on tile flooring at least 50% of the attempted transfers.     Baseline 8/23 mod-max assist    Time 4   Period Weeks   Status Revised     PT SHORT TERM GOAL #3   Title Patient will sit to stand to rollator with up to mod assist and perform pre-gait tasks with up to min assist x 3 minutes.    Time 4   Period Weeks   Status Revised           PT Long Term Goals - 11/23/16 2308      PT LONG TERM GOAL #1   Title Patient and caregiver/family will demonstrate independence with updated HEP  (11/23/16 see updated goal below)   Time 8   Period Weeks   Status On-going  Patient with many missed appt recently due to transportation issues     PT LONG TERM GOAL #3   Title Patient will perform sit to stand with LRAD with minimal assistance. (Target 01/22/17)   Baseline Added 11/23/16   Time 8   Period Weeks   Status Revised     PT LONG TERM GOAL #4   Title Patient will ambulate 10 ft with LRAD and up to moderate assistance and chair to follow.    Time 8   Period Weeks   Status New  due to lack of time; shortened session going to restroom     PT LONG TERM GOAL #5   Title Patient will have a method to educate her caregivers at the  ALF on the best method to help her transfer (ex. laminated sheet).    Baseline Patient has been unable to remember sequence for transfer. 10/02 No successful method developed (pt forgets to verbally instruct them in how to help her; pt and aide report the staff "move so fast, they don't slow down to look at pictures or ask quesions.   Time 8   Period Weeks   Status Not Met     PT LONG TERM GOAL #6   Title Patient  will stand with LRAD with mod assist.    Baseline 10/02 stood to rollator with mod assist, and able to maintain standing with up to min assist   Time 8   Period Weeks   Status Achieved     PT LONG TERM GOAL #7   Title Patient will perform pre-gait/balance activities x 5 minutes (no step under left foot) with LRAD. (11/23/16 goal updated/replaced above--no date extension)   Baseline 10/02 can do 5 minutes WITH 2" step under LLE in // bars   Time 8   Period Weeks   Status Partially Met     PT LONG TERM GOAL #8   Title Patient will be independent with her updated HEP for stretching and strengthening using handout. (Target 01/22/17)   Time 8   Period Weeks   Status New            Plan - 12/16/16 1109    Clinical Impression Statement Pt tolerated treatment well. Pt will benefit from further exercises and education with postural alignment to improve functional mobility, and will benefit from increasing strength and balance for safety with transfers.   Rehab Potential Fair   Clinical Impairments Affecting Rehab Potential duration >12 months since onset CVA; left foot drop from back surgery prior to CVA; bil LE contractures; need for assistance to carryover HEP and other mobility education   PT Frequency 2x / week   PT Duration 8 weeks   PT Treatment/Interventions ADLs/Self Care Home Management;DME Instruction;Gait training;Functional mobility training;Therapeutic activities;Therapeutic exercise;Balance training;Neuromuscular re-education;Cognitive remediation;Patient/family  education;Orthotic Fit/Training;Wheelchair mobility training;Manual techniques;Moist Heat;Ultrasound;Passive range of motion;Splinting;Taping   PT Next Visit Plan Continue working on transfers (she does not transfer on carpet at ALF, so try to practice this on tile floor), squat pivot and stand pivot. Sit to stand transfers, pre-gait and walking in // bars. Pt has "built up shoe" on left with black insert into cast shoe and donned over her shoe, increasing bilateral LE ROM.   Consulted and Agree with Plan of Care Patient;Family member/caregiver   Family Member Consulted Caregiver      Patient will benefit from skilled therapeutic intervention in order to improve the following deficits and impairments:  Decreased balance, Decreased coordination, Decreased knowledge of use of DME, Decreased mobility, Decreased range of motion, Decreased strength, Difficulty walking, Impaired flexibility, Impaired sensation, Impaired UE functional use, Postural dysfunction, Decreased cognition  Visit Diagnosis: Muscle weakness (generalized)  Other abnormalities of gait and mobility  Other symptoms and signs involving the musculoskeletal system     Problem List Patient Active Problem List   Diagnosis Date Noted  . Transient alteration of awareness   . Labile blood pressure   . Hypertensive urgency 10/27/2016  . Decreased responsiveness   . Bradycardia 10/19/2016  . Hypotension 10/19/2016  . Skull lesion 10/19/2016  . History of cerebrovascular accident (CVA) with residual deficit 10/19/2016  . Personal history of atrial flutter 10/19/2016  . Thoracic aortic atherosclerosis ([place]) 10/19/2016  . Intracranial atherosclerosis 10/19/2016  . Frailty syndrome in geriatric patient 10/19/2016  . Acute kidney injury ([place]) 10/19/2016  . 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 Syncope, Recurrent 10/18/2016  . Carotid artery disease ([place]) 07/06/2016  . Gait abnormality 05/13/2016  .  Cerebrovascular accident (CVA) due to thrombosis of right anterior cerebral artery ([place]) 05/13/2016  . History of heart block 05/07/2016  . Benign labile hypertension 08/21/2015  . Acute CVA (cerebrovascular accident) ([place]) 08/04/2015  . Left hemiparesis ([place]) 08/04/2015  . Primary  osteoarthritis of left knee 08/04/2015  . Esophageal reflux 08/04/2015  . Hyperlipidemia LDL goal <70 08/04/2015  . Paroxysmal atrial fibrillation ([place]) 08/04/2015  . PVD (peripheral vascular disease) ([place]) 08/04/2015  . Protein-calorie malnutrition ([place]) 08/04/2015    Andria Meuse, [place] 12/16/2016, 11:39 AM  [facility] [address] [place], [place], [address] Phone: [phone]   Fax:  [phone]  Name: Virginia Meyer MRN: [id number] Date of Birth: 15-Dec-1928  This note has been reviewed and edited by supervising CI.  Willow Ora, PTA, [facility] [address], [place], [place] [address] [phone] 12/16/16, 11:55 AM

## 2016-12-21 ENCOUNTER — Ambulatory Visit: Payer: Medicare HMO | Admitting: Physical Therapy

## 2016-12-21 NOTE — Therapy (Signed)
[facility] [address] [place], [place], [address] Phone: [phone]   Fax:  [phone]  Physical Therapy Treatment  Patient Details  Name: Virginia Meyer MRN: [id number] Date of Birth: [birth year] Referring Provider: Sarina Ill, MD  Encounter Date: 12/21/2016      PT End of Session - 12/21/16 1033    Visit Number --  Session canceled today   PT Start Time 5726   PT Stop Time 1021   PT Time Calculation (min) 5 min      Past Medical History:  Diagnosis Date  . Anxiety   . Fall   . GERD (gastroesophageal reflux disease)   . Heart failure ([place])   . HLD (hyperlipidemia)   . HTN (hypertension)   . Hyperlipidemia   . Hypotension   . Major depressive disorder   . Neuropathy   . PVD (peripheral vascular disease) ([place])   . Stroke [facility])    left sided deficit  . UTI (urinary tract infection)     Past Surgical History:  Procedure Laterality Date  . ABDOMINAL HYSTERECTOMY    . FOOT NEUROMA SURGERY    . SPINE SURGERY    . TONSILLECTOMY      There were no vitals filed for this visit.      Subjective Assessment - 12/21/16 1039    Subjective Pt had difficulty trying to stand yesterday. She wonders if the cold weather makes it more difficult.   Patient is accompained by: Family member   Pertinent History hospitalized with CHB 04/2016, afib, HTN, peripheral neuropathy, CHF, PVD, 05/27/15 large R MCA CVA, lt foot drop due to back sx 2015; *hospitalized 8/27-8/30 and 9/5-9/7 for syncope   Limitations Standing;Walking   How long can you stand comfortably? unable   How long can you walk comfortably? unable; self-propels her w/c with bil LEs and RUE   Patient Stated Goals she wants to learn to stand and walk again.   Currently in Pain? Yes   Pain Score 3    Pain Location Leg  Bilateral, due to neuropathy   Pain Type Chronic pain               PT Short Term Goals - 11/24/16 1728      PT SHORT TERM  GOAL #1   Title Patient will either improve Lt knee extension to -15 degrees or will have consulted orthotist to obtain built up shoe to allow progression to walking. (Target for all STGs 12/23/16)   Baseline 10/02  Lt knee extension -23    Time 4   Period Weeks   Status Revised   Target Date 12/23/16     PT SHORT TERM GOAL #2   Title Patient will transfer wheelchair to/from bed with minimal assistance on tile flooring at least 50% of the attempted transfers.     Baseline 8/23 mod-max assist    Time 4   Period Weeks   Status Revised     PT SHORT TERM GOAL #3   Title Patient will sit to stand to rollator with up to mod assist and perform pre-gait tasks with up to min assist x 3 minutes.    Time 4   Period Weeks   Status Revised           PT Long Term Goals - 11/23/16 2308      PT LONG TERM GOAL #1   Title Patient and caregiver/family will demonstrate independence with updated HEP  (11/23/16 see updated goal  below)   Time 8   Period Weeks   Status On-going  Patient with many missed appt recently due to transportation issues     PT LONG TERM GOAL #3   Title Patient will perform sit to stand with LRAD with minimal assistance. (Target 01/22/17)   Baseline Added 11/23/16   Time 8   Period Weeks   Status Revised     PT LONG TERM GOAL #4   Title Patient will ambulate 10 ft with LRAD and up to moderate assistance and chair to follow.    Time 8   Period Weeks   Status New  due to lack of time; shortened session going to restroom     PT LONG TERM GOAL #5   Title Patient will have a method to educate her caregivers at the ALF on the best method to help her transfer (ex. laminated sheet).    Baseline Patient has been unable to remember sequence for transfer. 10/02 No successful method developed (pt forgets to verbally instruct them in how to help her; pt and aide report the staff "move so fast, they don't slow down to look at pictures or ask quesions.   Time 8   Period Weeks    Status Not Met     PT LONG TERM GOAL #6   Title Patient will stand with LRAD with mod assist.    Baseline 10/02 stood to rollator with mod assist, and able to maintain standing with up to min assist   Time 8   Period Weeks   Status Achieved     PT LONG TERM GOAL #7   Title Patient will perform pre-gait/balance activities x 5 minutes (no step under left foot) with LRAD. (11/23/16 goal updated/replaced above--no date extension)   Baseline 10/02 can do 5 minutes WITH 2" step under LLE in // bars   Time 8   Period Weeks   Status Partially Met     PT LONG TERM GOAL #8   Title Patient will be independent with her updated HEP for stretching and strengthening using handout. (Target 01/22/17)   Time 8   Period Weeks   Status New               Plan - 12/21/16 1036    Clinical Impression Statement Pt had incontinence prior to arrival. No supplies were brought to clean patient, and pt needed to return to ALF for peri care. Session was canceled today.      Patient will benefit from skilled therapeutic intervention in order to improve the following deficits and impairments:     Visit Diagnosis: Muscle weakness (generalized)      Problem List Patient Active Problem List   Diagnosis Date Noted  . Transient alteration of awareness   . Labile blood pressure   . Hypertensive urgency 10/27/2016  . Decreased responsiveness   . Bradycardia 10/19/2016  . Hypotension 10/19/2016  . Skull lesion 10/19/2016  . History of cerebrovascular accident (CVA) with residual deficit 10/19/2016  . Personal history of atrial flutter 10/19/2016  . Thoracic aortic atherosclerosis ([place]) 10/19/2016  . Intracranial atherosclerosis 10/19/2016  . Frailty syndrome in geriatric patient 10/19/2016  . Acute kidney injury ([place]) 10/19/2016  . 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 Syncope, Recurrent 10/18/2016  . Carotid artery disease ([place]) 07/06/2016  . Gait abnormality  05/13/2016  . Cerebrovascular accident (CVA) due to thrombosis of right anterior cerebral artery ([place]) 05/13/2016  . History of heart block 05/07/2016  .  Benign labile hypertension 08/21/2015  . Acute CVA (cerebrovascular accident) ([place]) 08/04/2015  . Left hemiparesis ([place]) 08/04/2015  . Primary osteoarthritis of left knee 08/04/2015  . Esophageal reflux 08/04/2015  . Hyperlipidemia LDL goal <70 08/04/2015  . Paroxysmal atrial fibrillation ([place]) 08/04/2015  . PVD (peripheral vascular disease) ([place]) 08/04/2015  . Protein-calorie malnutrition ([place]) 08/04/2015    Andria Meuse, [place] 12/21/2016, 10:41 AM  [facility] [address] [place], [place], [address] Phone: [phone]   Fax:  [phone]  Name: Alexx Giambra MRN: [id number] Date of Birth: 04/09/1928  This note has been reviewed and edited by supervising CI.  Willow Ora, PTA, [facility] [address], [place], [place] [address] [phone] 12/21/16, 10:54 AM

## 2016-12-23 ENCOUNTER — Ambulatory Visit: Payer: Medicare HMO | Attending: Neurology | Admitting: Physical Therapy

## 2016-12-23 DIAGNOSIS — M6281 Muscle weakness (generalized): Secondary | ICD-10-CM

## 2016-12-23 DIAGNOSIS — R29898 Other symptoms and signs involving the musculoskeletal system: Secondary | ICD-10-CM | POA: Diagnosis not present

## 2016-12-23 DIAGNOSIS — R2689 Other abnormalities of gait and mobility: Secondary | ICD-10-CM | POA: Diagnosis not present

## 2016-12-23 NOTE — Therapy (Signed)
[facility] [address] [place], [place], [address] Phone: [phone]   Fax:  [phone]  Physical Therapy Treatment  Patient Details  Name: Virginia Meyer MRN: [id number] Date of Birth: 06-Jun-1928 Referring Provider: Sarina Ill, MD  Encounter Date: 12/23/2016      PT End of Session - 12/23/16 0930    Visit Number 25   Number of Visits 45   Date for PT Re-Evaluation 01/22/17   Authorization Type MCR; G-Code   Authorization Time Period 07/20/16 to 09/17/16; 09/16/16 to 11/15/16; 11/23/16 to 01/22/17   PT Start Time 0931   PT Stop Time 1017   PT Time Calculation (min) 46 min   Equipment Utilized During Treatment Gait belt   Activity Tolerance Patient tolerated treatment well   Behavior During Therapy [facility] for tasks assessed/performed      Past Medical History:  Diagnosis Date  . Anxiety   . Fall   . GERD (gastroesophageal reflux disease)   . Heart failure ([place])   . HLD (hyperlipidemia)   . HTN (hypertension)   . Hyperlipidemia   . Hypotension   . Major depressive disorder   . Neuropathy   . PVD (peripheral vascular disease) ([place])   . Stroke [facility])    left sided deficit  . UTI (urinary tract infection)     Past Surgical History:  Procedure Laterality Date  . ABDOMINAL HYSTERECTOMY    . FOOT NEUROMA SURGERY    . SPINE SURGERY    . TONSILLECTOMY      There were no vitals filed for this visit.      Subjective Assessment - 12/23/16 1028    Subjective Pt has no complaints and no pain. Per caregiver, pt is having a difficult time moving in the bed and sitting up in bed.    Patient is accompained by: Family member   Pertinent History hospitalized with [facility] 04/2016, afib, HTN, peripheral neuropathy, CHF, PVD, 05/27/15 large R MCA CVA, lt foot drop due to back sx 2015; *hospitalized 8/27-8/30 and 9/5-9/7 for syncope   Limitations Standing;Walking   How long can you stand comfortably? unable   How long  can you walk comfortably? unable; self-propels her w/c with bil LEs and RUE   Patient Stated Goals she wants to learn to stand and walk again.   Currently in Pain? No/denies                         [facility] Adult PT Treatment/Exercise - 12/23/16 0001      Bed Mobility   Bed Mobility Supine to Sit;Sit to Supine   Supine to Sit 4: Min assist  x2   Supine to Sit Details (indicate cue type and reason) VC's and tactile cues to bring legs to floor and bear weight through UE to assist in sitting   Sit to Supine 5: Supervision  x2     Transfers   Transfers Sit to Stand;Stand to Sit;Squat Pivot Transfers   Sit to Stand 3: Mod assist;With upper extremity assist;From bed   Sit to Stand Details Tactile cues for weight shifting;Tactile cues for sequencing;Verbal cues for sequencing;Verbal cues for technique;Manual facilitation for weight shifting;Manual facilitation for placement;Manual facilitation for weight bearing   Stand to Sit 3: Mod assist   Stand to Sit Details (indicate cue type and reason) Tactile cues for sequencing;Tactile cues for weight shifting;Verbal cues for sequencing;Verbal cues for technique;Manual facilitation for weight shifting;Manual facilitation for placement;Manual facilitation for weight  bearing   Squat Pivot Transfers With upper extremity assistance;2: Max assist   Squat Pivot Transfer Details (indicate cue type and reason) From w/c to mat table to w/c. VC's to lean forward and engage in transfer.   Comments performed sit<>stand x2 reps with cues/assistance each time. in standing- mod/max assist with cues on trunk extension/posture. Static standing balance for one minute x2. Facilitation provided at pelvis and knees with cues for pt to bring shoulders back. once in upright posture worked on lateral weight shifting, then in midline working on alternating UE raises.      Posture/Postural Control   Posture/Postural Control Postural limitations   Postural  Limitations Rounded Shoulders;Forward head;Increased thoracic kyphosis;Posterior pelvic tilt   Posture Comments in standing, worked on increasing BOS and upright shoulders     Exercises   Other Exercises  Scapular retraction squeezing shoulder blades x10, 1 set; shoulder rolls forward and backward x10 each, 1 set.     Knee/Hip Exercises: Stretches   Passive Hamstring Stretch Both;2 reps;60 seconds   Passive Hamstring Stretch Limitations Pt limited with Rt and Lt knee ext. Performed concurrently with heel cord stretch via single UE overpressure at knee for 30 seconds each leg x2 sets; then performed hamstring stretch with both hands for prolonged overpressure for 60 seconds x2 sets.   Other Knee/Hip Stretches Goniometry performed on L knee extension. AROM 35 degrees, PROM 32 degrees     Knee/Hip Exercises: Seated   Long Arc Quad --   Abduction/Adduction  --     Knee/Hip Exercises: Supine   Short Arc Quad Sets Right;Left;1 set;10 reps  Bolster under knees   Heel Slides Right;Left;1 set;10 reps   Heel Slides Limitations Shoes off for sliding; ROM limitations   Straight Leg Raises Right;Left;1 set;10 reps           PT Short Term Goals - 12/23/16 1514      PT SHORT TERM GOAL #1   Title Patient will either improve Lt knee extension to -15 degrees or will have consulted orthotist to obtain built up shoe to allow progression to walking. (Target for all STGs 12/23/16)   Baseline  measures L knee extension at -32 on 11/01 (10/02  Lt knee extension -23)   Time --   Period --   Status Not Met     PT SHORT TERM GOAL #2   Title Patient will transfer wheelchair to/from bed with minimal assistance on tile flooring at least 50% of the attempted transfers.     Baseline 8/23 mod-max assist    Status On-going     PT SHORT TERM GOAL #3   Title Patient will sit to stand to rollator with up to mod assist and perform pre-gait tasks with up to min assist x 3 minutes.    Baseline 8/22 7 minutes  standing wiht brief sitting when checkers fell to floor   Status On-going              PT Long Term Goals - 11/23/16 2308      PT LONG TERM GOAL #1   Title Patient and caregiver/family will demonstrate independence with updated HEP  (11/23/16 see updated goal below)   Time 8   Period Weeks   Status On-going  Patient with many missed appt recently due to transportation issues     PT LONG TERM GOAL #3   Title Patient will perform sit to stand with LRAD with minimal assistance. (Target 01/22/17)   Baseline Added 11/23/16  Time 8   Period Weeks   Status Revised     PT LONG TERM GOAL #4   Title Patient will ambulate 10 ft with LRAD and up to moderate assistance and chair to follow.    Time 8   Period Weeks   Status New  due to lack of time; shortened session going to restroom     PT LONG TERM GOAL #5   Title Patient will have a method to educate her caregivers at the ALF on the best method to help her transfer (ex. laminated sheet).    Baseline Patient has been unable to remember sequence for transfer. 10/02 No successful method developed (pt forgets to verbally instruct them in how to help her; pt and aide report the staff "move so fast, they don't slow down to look at pictures or ask quesions.   Time 8   Period Weeks   Status Not Met     PT LONG TERM GOAL #6   Title Patient will stand with LRAD with mod assist.    Baseline 10/02 stood to rollator with mod assist, and able to maintain standing with up to min assist   Time 8   Period Weeks   Status Achieved     PT LONG TERM GOAL #7   Title Patient will perform pre-gait/balance activities x 5 minutes (no step under left foot) with LRAD. (11/23/16 goal updated/replaced above--no date extension)   Baseline 10/02 can do 5 minutes WITH 2" step under LLE in // bars   Time 8   Period Weeks   Status Partially Met     PT LONG TERM GOAL #8   Title Patient will be independent with her updated HEP for stretching and  strengthening using handout. (Target 01/22/17)   Time 8   Period Weeks   Status New            Plan - 12/23/16 1031    Clinical Impression Statement Pt begain treatment with no pain, but felt 4/10 pain in lower back at end of session due to sit/stand and weight shifting. Pt did well bearing weight through bilateral LE. Patient tolerated treatment well and will benefit from further PT to increase LE ROM and for overall strengthening. No goals were met at this time.    Clinical Impairments Affecting Rehab Potential duration >12 months since onset CVA; left foot drop from back surgery prior to CVA; bil LE contractures; need for assistance to carryover HEP and other mobility education   PT Treatment/Interventions ADLs/Self Care Home Management;DME Instruction;Gait training;Functional mobility training;Therapeutic activities;Therapeutic exercise;Balance training;Neuromuscular re-education;Cognitive remediation;Patient/family education;Orthotic Fit/Training;Wheelchair mobility training;Manual techniques;Moist Heat;Ultrasound;Passive range of motion;Splinting;Taping   PT Next Visit Plan Check remaining STGs; Transfers, squat pivot and stand pivot. Standing at [place], weight bearing and weight shifting. Increase LE ROM.   Consulted and Agree with Plan of Care Patient;Family member/caregiver   Family Member Consulted Caregiver           Patient will benefit from skilled therapeutic intervention in order to improve the following deficits and impairments:  Decreased balance, Decreased coordination, Decreased knowledge of use of DME, Decreased mobility, Decreased range of motion, Decreased strength, Difficulty walking, Impaired flexibility, Impaired sensation, Impaired UE functional use, Postural dysfunction, Decreased cognition  Visit Diagnosis: Muscle weakness (generalized)  Other abnormalities of gait and mobility  Other symptoms and signs involving the musculoskeletal system     Problem  List Patient Active Problem List   Diagnosis Date Noted  . Transient alteration of awareness   .  Labile blood pressure   . Hypertensive urgency 10/27/2016  . Decreased responsiveness   . Bradycardia 10/19/2016  . Hypotension 10/19/2016  . Skull lesion 10/19/2016  . History of cerebrovascular accident (CVA) with residual deficit 10/19/2016  . Personal history of atrial flutter 10/19/2016  . Thoracic aortic atherosclerosis ([place]) 10/19/2016  . Intracranial atherosclerosis 10/19/2016  . Frailty syndrome in geriatric patient 10/19/2016  . Acute kidney injury ([place]) 10/19/2016  . 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 Syncope, Recurrent 10/18/2016  . Carotid artery disease ([place]) 07/06/2016  . Gait abnormality 05/13/2016  . Cerebrovascular accident (CVA) due to thrombosis of right anterior cerebral artery ([place]) 05/13/2016  . History of heart block 05/07/2016  . Benign labile hypertension 08/21/2015  . Acute CVA (cerebrovascular accident) ([place]) 08/04/2015  . Left hemiparesis ([place]) 08/04/2015  . Primary osteoarthritis of left knee 08/04/2015  . Esophageal reflux 08/04/2015  . Hyperlipidemia LDL goal <70 08/04/2015  . Paroxysmal atrial fibrillation ([place]) 08/04/2015  . PVD (peripheral vascular disease) ([place]) 08/04/2015  . Protein-calorie malnutrition ([place]) 08/04/2015    Andria Meuse, [place] 12/23/2016, 3:17 PM  [facility] [address] [place], [place], [address] Phone: [phone]   Fax:  [phone]  Name: Virginia Meyer MRN: [id number] Date of Birth: 03-Aug-1928

## 2016-12-27 DIAGNOSIS — G4700 Insomnia, unspecified: Secondary | ICD-10-CM | POA: Diagnosis not present

## 2016-12-27 DIAGNOSIS — R69 Illness, unspecified: Secondary | ICD-10-CM | POA: Diagnosis not present

## 2016-12-27 DIAGNOSIS — F329 Major depressive disorder, single episode, unspecified: Secondary | ICD-10-CM | POA: Diagnosis not present

## 2016-12-27 DIAGNOSIS — F4322 Adjustment disorder with anxiety: Secondary | ICD-10-CM | POA: Diagnosis not present

## 2016-12-27 DIAGNOSIS — R451 Restlessness and agitation: Secondary | ICD-10-CM | POA: Diagnosis not present

## 2016-12-28 ENCOUNTER — Ambulatory Visit: Payer: Medicare HMO | Admitting: Physical Therapy

## 2016-12-28 DIAGNOSIS — N183 Chronic kidney disease, stage 3 (moderate): Secondary | ICD-10-CM | POA: Diagnosis not present

## 2016-12-28 DIAGNOSIS — Z993 Dependence on wheelchair: Secondary | ICD-10-CM | POA: Diagnosis not present

## 2016-12-28 DIAGNOSIS — Z8673 Personal history of transient ischemic attack (TIA), and cerebral infarction without residual deficits: Secondary | ICD-10-CM | POA: Diagnosis not present

## 2016-12-28 DIAGNOSIS — R29898 Other symptoms and signs involving the musculoskeletal system: Secondary | ICD-10-CM | POA: Diagnosis not present

## 2016-12-28 DIAGNOSIS — I10 Essential (primary) hypertension: Secondary | ICD-10-CM | POA: Diagnosis not present

## 2016-12-28 DIAGNOSIS — M6281 Muscle weakness (generalized): Secondary | ICD-10-CM | POA: Diagnosis not present

## 2016-12-28 DIAGNOSIS — R2689 Other abnormalities of gait and mobility: Secondary | ICD-10-CM

## 2016-12-28 DIAGNOSIS — I4891 Unspecified atrial fibrillation: Secondary | ICD-10-CM | POA: Diagnosis not present

## 2016-12-28 DIAGNOSIS — M1990 Unspecified osteoarthritis, unspecified site: Secondary | ICD-10-CM | POA: Diagnosis not present

## 2016-12-28 DIAGNOSIS — G9009 Other idiopathic peripheral autonomic neuropathy: Secondary | ICD-10-CM | POA: Diagnosis not present

## 2016-12-28 DIAGNOSIS — Z7901 Long term (current) use of anticoagulants: Secondary | ICD-10-CM | POA: Diagnosis not present

## 2016-12-28 NOTE — Therapy (Signed)
[facility] [address] [place], [place], [address] Phone: [phone]   Fax:  [phone]  Physical Therapy Treatment  Patient Details  Name: Virginia Meyer MRN: [id number] Date of Birth: 1928-08-22 Referring Provider: Sarina Ill, MD   Encounter Date: 12/28/2016  PT End of Session - 12/28/16 1133    Visit Number  26    Number of Visits  45    Date for PT Re-Evaluation  01/22/17    Authorization Type  MCR; G-Code    Authorization Time Period  07/20/16 to 09/17/16; 09/16/16 to 11/15/16; 11/23/16 to 01/22/17    PT Start Time  1017    PT Stop Time  1100    PT Time Calculation (min)  43 min    Equipment Utilized During Treatment  Gait belt    Activity Tolerance  Patient tolerated treatment well    Behavior During Therapy  [facility] for tasks assessed/performed       Past Medical History:  Diagnosis Date  . Anxiety   . Fall   . GERD (gastroesophageal reflux disease)   . Heart failure ([place])   . HLD (hyperlipidemia)   . HTN (hypertension)   . Hyperlipidemia   . Hypotension   . Major depressive disorder   . Neuropathy   . PVD (peripheral vascular disease) ([place])   . Stroke [facility])    left sided deficit  . UTI (urinary tract infection)     Past Surgical History:  Procedure Laterality Date  . ABDOMINAL HYSTERECTOMY    . FOOT NEUROMA SURGERY    . SPINE SURGERY    . TONSILLECTOMY      There were no vitals filed for this visit.  Subjective Assessment - 12/28/16 1113    Subjective  Pt has no complaints and no pain. Per caregiver, pt is forgetting about her left arm.    Patient is accompained by:  Family member Caregiver is Pharmacist, hospital.   Caregiver is Baker Janus.   Pertinent History  hospitalized with CHB 04/2016, afib, HTN, peripheral neuropathy, CHF, PVD, 05/27/15 large R MCA CVA, lt foot drop due to back sx 2015; *hospitalized 8/27-8/30 and 9/5-9/7 for syncope    Limitations  Standing;Walking    How long can you stand  comfortably?  unable    How long can you walk comfortably?  unable; self-propels her w/c with bil LEs and RUE    Patient Stated Goals  she wants to learn to stand and walk again.    Currently in Pain?  No/denies           [facility] Adult PT Treatment/Exercise - 12/28/16 0001      Bed Mobility   Bed Mobility  Supine to Sit;Sit to Supine    Supine to Sit  4: Min assist x2   x2   Supine to Sit Details (indicate cue type and reason)  VC's and tactile cues to bring legs to floor and bear weight through UE to assist in sitting.    Sit to Supine  4: Min assist x2   x2   Sit to Supine - Details (indicate cue type and reason)  VC's and tactile cues to assist in raising legs to mat and slowly lower onto mat using UE for support.      Transfers   Transfers  Sit to Stand;Stand to [employer] Transfers    Sit to Stand  3: Mod assist;With upper extremity assist;From bed    Sit to Stand Details  Tactile cues for sequencing;Verbal  cues for sequencing;Verbal cues for technique;Manual facilitation for placement;Manual facilitation for weight bearing    Stand to Sit  4: Min assist    Stand to Sit Details (indicate cue type and reason)  Tactile cues for sequencing;Tactile cues for weight shifting;Verbal cues for technique;Manual facilitation for weight shifting;Manual facilitation for placement;Manual facilitation for weight bearing;Verbal cues for sequencing    Stand Pivot Transfers  3: Mod assist    Stand Pivot Transfer Details (indicate cue type and reason)  From w/c to mat table to w/c. VC's given for pt assistance in pivoting LE and lowering to sit.    Comments  All sit/stand performed with pt wearing "built up shoe" on left foot to improve pt posture. Performed sit<>stand x1 rep with cues/assistance in standing- mod/max assist with cues on trunk extension/posture. Facilitation provided at pelvis and knees with cues for pt to bring shoulders back. Performed sit<>stand from mat table to RW with mod A  +2, VC's given to shift weight forward to stand, proper posture and to straighten knees. Once in upright posture worked on lateral weight shifting, then in midline working on alternating UE raises.       Posture/Postural Control   Posture/Postural Control  Postural limitations    Postural Limitations  Rounded Shoulders;Forward head;Increased thoracic kyphosis;Posterior pelvic tilt    Posture Comments  in standing, VC's given to focus on upright shoulders      Exercises   Other Exercises   Scapular retraction squeezing shoulder blades x10, 1 set; shoulder rolls forward and backward x10 each, 1 set. Cervical retraction and cervical rotation x10 each. Pt needed VC's and tactile cues to prevent excessive leaning back at trunk.      Knee/Hip Exercises: Stretches   Passive Hamstring Stretch  Both;3 reps;30 seconds    Passive Hamstring Stretch Limitations  Pt limited with Rt and Lt knee ext. Performed concurrently with heel cord stretch via single UE overpressure at knee for 30 seconds each leg x2 sets; then performed hamstring stretch with both hands for prolonged overpressure for 30 seconds x3 sets.      Knee/Hip Exercises: Supine   Short Arc Quad Sets  Right;Left;1 set;10 reps Bolster under knees   Bolster under knees   Straight Leg Raises  Right;Left;1 set;10 reps         PT Short Term Goals - 12/23/16 1514      PT SHORT TERM GOAL #1   Title  Patient will either improve Lt knee extension to -15 degrees or will have consulted orthotist to obtain built up shoe to allow progression to walking. (Target for all STGs 12/23/16)    Baseline   measures L knee extension at -32 on 11/01 (10/02  Lt knee extension -23)    Time  --    Period  --    Status  Not Met      PT SHORT TERM GOAL #2   Title  Patient will transfer wheelchair to/from bed with minimal assistance on tile flooring at least 50% of the attempted transfers.      Baseline  8/23 mod-max assist     Status  On-going      PT SHORT TERM  GOAL #3   Title  Patient will sit to stand to rollator with up to mod assist and perform pre-gait tasks with up to min assist x 3 minutes.     Baseline  8/22 7 minutes standing wiht brief sitting when checkers fell to floor    Status  On-going        PT Long Term Goals - 11/23/16 2308      PT LONG TERM GOAL #1   Title  Patient and caregiver/family will demonstrate independence with updated HEP  (11/23/16 see updated goal below)    Time  8    Period  Weeks    Status  On-going Patient with many missed appt recently due to transportation issues   Patient with many missed appt recently due to transportation issues     PT LONG TERM GOAL #3   Title  Patient will perform sit to stand with LRAD with minimal assistance. (Target 01/22/17)    Baseline  Added 11/23/16    Time  8    Period  Weeks    Status  Revised      PT LONG TERM GOAL #4   Title  Patient will ambulate 10 ft with LRAD and up to moderate assistance and chair to follow.     Time  8    Period  Weeks    Status  New due to lack of time; shortened session going to restroom   due to lack of time; shortened session going to restroom     PT LONG TERM GOAL #5   Title  Patient will have a method to educate her caregivers at the ALF on the best method to help her transfer (ex. laminated sheet).     Baseline  Patient has been unable to remember sequence for transfer. 10/02 No successful method developed (pt forgets to verbally instruct them in how to help her; pt and aide report the staff "move so fast, they don't slow down to look at pictures or ask quesions.    Time  8    Period  Weeks    Status  Not Met      PT LONG TERM GOAL #6   Title  Patient will stand with LRAD with mod assist.     Baseline  10/02 stood to rollator with mod assist, and able to maintain standing with up to min assist    Time  8    Period  Weeks    Status  Achieved      PT LONG TERM GOAL #7   Title  Patient will perform pre-gait/balance activities x 5  minutes (no step under left foot) with LRAD. (11/23/16 goal updated/replaced above--no date extension)    Baseline  10/02 can do 5 minutes WITH 2" step under LLE in // bars    Time  8    Period  Weeks    Status  Partially Met      PT LONG TERM GOAL #8   Title  Patient will be independent with her updated HEP for stretching and strengthening using handout. (Target 01/22/17)    Time  8    Period  Weeks    Status  New            Plan - 12/28/16 1135    Clinical Impression Statement  Pt tolerated treatment well. Pt is still quite limited in knee extension in both L and R LE. Pt performed weight-bearing and weight-shifting at RW with some discomfort in R and L knee and L hip. Pt caregiver stated that pt is not using her left UE very often. Pt will benefit from continued PT to improve LE ROM and function.     Clinical Impairments Affecting Rehab Potential  duration >12 months since onset CVA; left foot drop from back  surgery prior to CVA; bil LE contractures; need for assistance to carryover HEP and other mobility education    PT Frequency  2x / week    PT Duration  8 weeks    PT Treatment/Interventions  ADLs/Self Care Home Management;DME Instruction;Gait training;Functional mobility training;Therapeutic activities;Therapeutic exercise;Balance training;Neuromuscular re-education;Cognitive remediation;Patient/family education;Orthotic Fit/Training;Wheelchair mobility training;Manual techniques;Moist Heat;Ultrasound;Passive range of motion;Splinting;Taping    PT Next Visit Plan  Transfers, squat pivot and stand pivot. Standing at [place], weight bearing and weight shifting. Increase LE ROM.    Consulted and Agree with Plan of Care  Patient;Family member/caregiver    Family Member Consulted  Caregiver       Patient will benefit from skilled therapeutic intervention in order to improve the following deficits and impairments:  Decreased balance, Decreased coordination, Decreased knowledge of use of  DME, Decreased mobility, Decreased range of motion, Decreased strength, Difficulty walking, Impaired flexibility, Impaired sensation, Impaired UE functional use, Postural dysfunction, Decreased cognition  Visit Diagnosis: Muscle weakness (generalized)  Other abnormalities of gait and mobility  Other symptoms and signs involving the musculoskeletal system     Problem List Patient Active Problem List   Diagnosis Date Noted  . Transient alteration of awareness   . Labile blood pressure   . Hypertensive urgency 10/27/2016  . Decreased responsiveness   . Bradycardia 10/19/2016  . Hypotension 10/19/2016  . Skull lesion 10/19/2016  . History of cerebrovascular accident (CVA) with residual deficit 10/19/2016  . Personal history of atrial flutter 10/19/2016  . Thoracic aortic atherosclerosis ([place]) 10/19/2016  . Intracranial atherosclerosis 10/19/2016  . Frailty syndrome in geriatric patient 10/19/2016  . Acute kidney injury ([place]) 10/19/2016  . 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 Syncope, Recurrent 10/18/2016  . Carotid artery disease ([place]) 07/06/2016  . Gait abnormality 05/13/2016  . Cerebrovascular accident (CVA) due to thrombosis of right anterior cerebral artery ([place]) 05/13/2016  . History of heart block 05/07/2016  . Benign labile hypertension 08/21/2015  . Acute CVA (cerebrovascular accident) ([place]) 08/04/2015  . Left hemiparesis ([place]) 08/04/2015  . Primary osteoarthritis of left knee 08/04/2015  . Esophageal reflux 08/04/2015  . Hyperlipidemia LDL goal <70 08/04/2015  . Paroxysmal atrial fibrillation ([place]) 08/04/2015  . PVD (peripheral vascular disease) ([place]) 08/04/2015  . Protein-calorie malnutrition ([place]) 08/04/2015    Andria Meuse, [place] 12/28/2016, 11:40 AM  [facility] [address] [place], [place], [address] Phone: [phone]   Fax:  [phone]  Name: Maudine Kluesner MRN: [id number] Date of Birth: [birth year]

## 2016-12-30 ENCOUNTER — Encounter: Payer: Self-pay | Admitting: Physical Therapy

## 2016-12-30 ENCOUNTER — Ambulatory Visit: Payer: Medicare HMO | Admitting: Physical Therapy

## 2016-12-30 DIAGNOSIS — R2689 Other abnormalities of gait and mobility: Secondary | ICD-10-CM

## 2016-12-30 DIAGNOSIS — R29898 Other symptoms and signs involving the musculoskeletal system: Secondary | ICD-10-CM

## 2016-12-30 DIAGNOSIS — M6281 Muscle weakness (generalized): Secondary | ICD-10-CM

## 2016-12-30 NOTE — Therapy (Signed)
[facility] [address] [place], [place], [address] Phone: [phone]   Fax:  [phone]  Physical Therapy Treatment  Patient Details  Name: Virginia Meyer MRN: [id number] Date of Birth: 1928-11-02 Referring Provider: Sarina Ill, MD   Encounter Date: 12/30/2016  PT End of Session - 12/30/16 1936    Visit Number  27    Number of Visits  45    Date for PT Re-Evaluation  01/22/17    Authorization Type  MCR; G-Code    Authorization Time Period  07/20/16 to 09/17/16; 09/16/16 to 11/15/16; 11/23/16 to 01/22/17    PT Start Time  1017    PT Stop Time  1101    PT Time Calculation (min)  44 min    Equipment Utilized During Treatment  Gait belt    Activity Tolerance  Patient tolerated treatment well    Behavior During Therapy  [facility] for tasks assessed/performed       Past Medical History:  Diagnosis Date  . Anxiety   . Fall   . GERD (gastroesophageal reflux disease)   . Heart failure ([place])   . HLD (hyperlipidemia)   . HTN (hypertension)   . Hyperlipidemia   . Hypotension   . Major depressive disorder   . Neuropathy   . PVD (peripheral vascular disease) ([place])   . Stroke [facility])    left sided deficit  . UTI (urinary tract infection)     Past Surgical History:  Procedure Laterality Date  . ABDOMINAL HYSTERECTOMY    . FOOT NEUROMA SURGERY    . SPINE SURGERY    . TONSILLECTOMY      There were no vitals filed for this visit.  Subjective Assessment - 12/30/16 1024    Subjective  Has a headache. No other changes. Has not noticed left knee being more stiff.    Patient is accompained by:  Family member Caregiver is Pharmacist, hospital.    Pertinent History  hospitalized with [facility] 04/2016, afib, HTN, peripheral neuropathy, CHF, PVD, 05/27/15 large R MCA CVA, lt foot drop due to back sx 2015; *hospitalized 8/27-8/30 and 9/5-9/7 for syncope    Limitations  Standing;Walking    How long can you stand comfortably?  unable    How long can  you walk comfortably?  unable; self-propels her w/c with bil LEs and RUE    Patient Stated Goals  she wants to learn to stand and walk again.    Currently in Pain?  Yes    Pain Score  3     Pain Location  Knee    Pain Orientation  Left    Pain Descriptors / Indicators  Aching    Pain Type  Chronic pain    Pain Onset  More than a month ago    Pain Frequency  Intermittent        Treatment:  Sit to stand in // bars with simulated built-up shoe on LLE ("cast shoe" plus 1 inch lift) with mod to max assist x 3 reps. Patient with slightly less retropulsion, however with weaker push through her legs. Patient required assist to advance LLE into a weightbearing position. She tolerated ~60 seconds of standing with working towards midline (leaning vs pushing to her left).   Attempted sit to stand with "stedy" transfer equipment with pt achieving ~1/2 standing with mod assist. Could not advance further due to patient's report of knee pain.   Contract relax stretching of Lt knee with pt ultimately achieving -30 degrees of  knee extension (had progressed to -23 and has regressed).                          PT Short Term Goals - 12/23/16 1514      PT SHORT TERM GOAL #1   Title  Patient will either improve Lt knee extension to -15 degrees or will have consulted orthotist to obtain built up shoe to allow progression to walking. (Target for all STGs 12/23/16)    Baseline   measures L knee extension at -32 on 11/01 (10/02  Lt knee extension -23)    Time  --    Period  --    Status  Not Met      PT SHORT TERM GOAL #2   Title  Patient will transfer wheelchair to/from bed with minimal assistance on tile flooring at least 50% of the attempted transfers.      Baseline  8/23 mod-max assist     Status  On-going      PT SHORT TERM GOAL #3   Title  Patient will sit to stand to rollator with up to mod assist and perform pre-gait tasks with up to min assist x 3 minutes.     Baseline  8/22 7  minutes standing wiht brief sitting when checkers fell to floor    Status  On-going        PT Long Term Goals - 11/23/16 2308      PT LONG TERM GOAL #1   Title  Patient and caregiver/family will demonstrate independence with updated HEP  (11/23/16 see updated goal below)    Time  8    Period  Weeks    Status  On-going Patient with many missed appt recently due to transportation issues      PT LONG TERM GOAL #3   Title  Patient will perform sit to stand with LRAD with minimal assistance. (Target 01/22/17)    Baseline  Added 11/23/16    Time  8    Period  Weeks    Status  Revised      PT LONG TERM GOAL #4   Title  Patient will ambulate 10 ft with LRAD and up to moderate assistance and chair to follow.     Time  8    Period  Weeks    Status  New due to lack of time; shortened session going to restroom      PT LONG TERM GOAL #5   Title  Patient will have a method to educate her caregivers at the ALF on the best method to help her transfer (ex. laminated sheet).     Baseline  Patient has been unable to remember sequence for transfer. 10/02 No successful method developed (pt forgets to verbally instruct them in how to help her; pt and aide report the staff "move so fast, they don't slow down to look at pictures or ask quesions.    Time  8    Period  Weeks    Status  Not Met      PT LONG TERM GOAL #6   Title  Patient will stand with LRAD with mod assist.     Baseline  10/02 stood to rollator with mod assist, and able to maintain standing with up to min assist    Time  8    Period  Weeks    Status  Achieved      PT LONG TERM GOAL #7  Title  Patient will perform pre-gait/balance activities x 5 minutes (no step under left foot) with LRAD. (11/23/16 goal updated/replaced above--no date extension)    Baseline  10/02 can do 5 minutes WITH 2" step under LLE in // bars    Time  8    Period  Weeks    Status  Partially Met      PT LONG TERM GOAL #8   Title  Patient will be  independent with her updated HEP for stretching and strengthening using handout. (Target 01/22/17)    Time  8    Period  Weeks    Status  New            Plan - 12/30/16 1937    Clinical Impression Statement  Session focused on mobility tasks of scooting forward to edge of seat, sit to stand, standing balance (including finding midline), and attempted use of "stedy" for transfers bed to chair. Patient noted to have increased inattention of left side, increased lean to left (and thought she was leaning right), and incr difficulty with sit to stand (in // bars). Caregiver with patient feels pt declined a few weeks ago. Patient gave me permission to discuss noted changes and possible need for equipment for safe transfer. Patient currently is not progressing due in part to incr bil knee pain and weakness..     Clinical Impairments Affecting Rehab Potential  duration >12 months since onset CVA; left foot drop from back surgery prior to CVA; bil LE contractures; need for assistance to carryover HEP and other mobility education    PT Frequency  2x / week    PT Duration  8 weeks    PT Treatment/Interventions  ADLs/Self Care Home Management;DME Instruction;Gait training;Functional mobility training;Therapeutic activities;Therapeutic exercise;Balance training;Neuromuscular re-education;Cognitive remediation;Patient/family education;Orthotic Fit/Training;Wheelchair mobility training;Manual techniques;Moist Heat;Ultrasound;Passive range of motion;Splinting;Taping    PT Next Visit Plan  Transfers, squat pivot and stand pivot on tile (she does not have carpet at ALF). Standing in // bars with built up shoe--, weight bearing and weight shifting. Increase LE ROM.    Consulted and Agree with Plan of Care  Patient;Family member/caregiver    Family Member Consulted  Caregiver       Patient will benefit from skilled therapeutic intervention in order to improve the following deficits and impairments:  Decreased  balance, Decreased coordination, Decreased knowledge of use of DME, Decreased mobility, Decreased range of motion, Decreased strength, Difficulty walking, Impaired flexibility, Impaired sensation, Impaired UE functional use, Postural dysfunction, Decreased cognition  Visit Diagnosis: Muscle weakness (generalized)  Other abnormalities of gait and mobility  Other symptoms and signs involving the musculoskeletal system     Problem List Patient Active Problem List   Diagnosis Date Noted  . Transient alteration of awareness   . Labile blood pressure   . Hypertensive urgency 10/27/2016  . Decreased responsiveness   . Bradycardia 10/19/2016  . Hypotension 10/19/2016  . Skull lesion 10/19/2016  . History of cerebrovascular accident (CVA) with residual deficit 10/19/2016  . Personal history of atrial flutter 10/19/2016  . Thoracic aortic atherosclerosis ([place]) 10/19/2016  . Intracranial atherosclerosis 10/19/2016  . Frailty syndrome in geriatric patient 10/19/2016  . Acute kidney injury ([place]) 10/19/2016  . 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 Syncope, Recurrent 10/18/2016  . Carotid artery disease ([place]) 07/06/2016  . Gait abnormality 05/13/2016  . Cerebrovascular accident (CVA) due to thrombosis of right anterior cerebral artery ([place]) 05/13/2016  . History of heart block 05/07/2016  .  Benign labile hypertension 08/21/2015  . Acute CVA (cerebrovascular accident) ([place]) 08/04/2015  . Left hemiparesis ([place]) 08/04/2015  . Primary osteoarthritis of left knee 08/04/2015  . Esophageal reflux 08/04/2015  . Hyperlipidemia LDL goal <70 08/04/2015  . Paroxysmal atrial fibrillation ([place]) 08/04/2015  . PVD (peripheral vascular disease) ([place]) 08/04/2015  . Protein-calorie malnutrition ([place]) 08/04/2015    Rexanne Mano, PT 12/30/2016, 7:50 PM  [facility] [address] [place], [place], [address] Phone:  [phone]   Fax:  [phone]  Name: Virginia Meyer MRN: [id number] Date of Birth: December 30, 1928

## 2017-01-02 DIAGNOSIS — I6789 Other cerebrovascular disease: Secondary | ICD-10-CM | POA: Diagnosis not present

## 2017-01-02 DIAGNOSIS — R4781 Slurred speech: Secondary | ICD-10-CM | POA: Diagnosis not present

## 2017-01-03 ENCOUNTER — Telehealth: Payer: Self-pay | Admitting: *Deleted

## 2017-01-03 NOTE — Telephone Encounter (Signed)
-----   Message from Rexanne Mano, PT sent at 01/03/2017 11:55 AM EST ----- Regarding: RE: request for HHPT Sorry! It's Home Health PT evaluation.  She lives in an [facility]) and they have home health agencies that serve their community. I would think you would fax the order to their front office and they can arrange it.   If there is something I can do to facilitate the process, please let me know.  Thanks, Jeani Hawking  ----- Message ----- From: Melvenia Beam, MD Sent: 01/03/2017  11:11 AM To: Rexanne Mano, PT, Gildardo Griffes, RN Subject: RE: request for HHPT                           Hi Jeani Hawking, sure we can order that. But what is HHPT evaluation? Let us know and we can order it :)  ----- Message ----- From: Rexanne Mano, PT Sent: 01/03/2017   7:53 AM To: Melvenia Beam, MD Subject: request for HHPT                               Dr. Jaclynn Major Badalamenti was evaluated by Outpatient Neurorehab Physical Therapy on 07/20/16.  During that time she began to make progress with left knee ROM, strengthening, and standing. However in the past few weeks, she has had more knee pain and has begun to decline. It is unclear the cause of the increase, however it could be from the way staff assist her in transfers to/from her wheelchair (i.e. It can be difficult to pivot her feet and easy to twist her knees).  The patient would benefit from HHPT evaluation for transfer training and staff education in her own environment. I have suggested this to both Ms. Heenan and her daughter and both are in agreement with this plan.     If you agree, please order HHPT for Ms. Stallings.   Thank you,  Barry Brunner, PT Outpatient Neuro[facility]habilitation [address], [place], [place] [address] [phone]

## 2017-01-03 NOTE — Telephone Encounter (Signed)
Dr. Jaynee Eagles aware, will place order for Home Health PT.

## 2017-01-04 ENCOUNTER — Ambulatory Visit: Payer: Medicare HMO | Admitting: Physical Therapy

## 2017-01-04 ENCOUNTER — Other Ambulatory Visit: Payer: Self-pay | Admitting: Neurology

## 2017-01-04 DIAGNOSIS — M25562 Pain in left knee: Secondary | ICD-10-CM

## 2017-01-04 DIAGNOSIS — Z9181 History of falling: Secondary | ICD-10-CM

## 2017-01-04 DIAGNOSIS — M25561 Pain in right knee: Secondary | ICD-10-CM

## 2017-01-04 DIAGNOSIS — R269 Unspecified abnormalities of gait and mobility: Secondary | ICD-10-CM

## 2017-01-04 DIAGNOSIS — R531 Weakness: Secondary | ICD-10-CM

## 2017-01-04 NOTE — Telephone Encounter (Signed)
Hinton Dyer, I ordered home health.   She lives in an [facility]) and they have home health agencies that serve their community. I would think you would fax the order to their front office and they can arrange it.

## 2017-01-04 NOTE — Telephone Encounter (Signed)
Order has been sent to [facility] of [place] they use [facility] telephone [phone]- fax  [phone]  .

## 2017-01-06 ENCOUNTER — Ambulatory Visit: Payer: Medicare HMO | Admitting: Physical Therapy

## 2017-01-06 ENCOUNTER — Emergency Department (HOSPITAL_COMMUNITY): Payer: Medicare HMO

## 2017-01-06 ENCOUNTER — Encounter (HOSPITAL_COMMUNITY): Payer: Self-pay | Admitting: Emergency Medicine

## 2017-01-06 ENCOUNTER — Inpatient Hospital Stay (HOSPITAL_COMMUNITY)
Admission: EM | Admit: 2017-01-06 | Discharge: 2017-01-08 | DRG: 101 | Disposition: A | Discharge status: Nursing Home (Includes ICF) | Payer: Medicare HMO | Attending: Student in an Organized Health Care Education/Training Program | Admitting: Student in an Organized Health Care Education/Training Program

## 2017-01-06 ENCOUNTER — Observation Stay (HOSPITAL_COMMUNITY): Payer: Medicare HMO

## 2017-01-06 DIAGNOSIS — I4891 Unspecified atrial fibrillation: Secondary | ICD-10-CM | POA: Diagnosis not present

## 2017-01-06 DIAGNOSIS — Z90710 Acquired absence of both cervix and uterus: Secondary | ICD-10-CM | POA: Diagnosis not present

## 2017-01-06 DIAGNOSIS — G629 Polyneuropathy, unspecified: Secondary | ICD-10-CM | POA: Diagnosis not present

## 2017-01-06 DIAGNOSIS — Z7982 Long term (current) use of aspirin: Secondary | ICD-10-CM | POA: Diagnosis not present

## 2017-01-06 DIAGNOSIS — R202 Paresthesia of skin: Secondary | ICD-10-CM | POA: Diagnosis not present

## 2017-01-06 DIAGNOSIS — Z8673 Personal history of transient ischemic attack (TIA), and cerebral infarction without residual deficits: Secondary | ICD-10-CM | POA: Diagnosis not present

## 2017-01-06 DIAGNOSIS — I161 Hypertensive emergency: Secondary | ICD-10-CM | POA: Diagnosis present

## 2017-01-06 DIAGNOSIS — R29701 NIHSS score 1: Secondary | ICD-10-CM | POA: Diagnosis present

## 2017-01-06 DIAGNOSIS — Z66 Do not resuscitate: Secondary | ICD-10-CM | POA: Diagnosis present

## 2017-01-06 DIAGNOSIS — R569 Unspecified convulsions: Secondary | ICD-10-CM

## 2017-01-06 DIAGNOSIS — E785 Hyperlipidemia, unspecified: Secondary | ICD-10-CM | POA: Diagnosis not present

## 2017-01-06 DIAGNOSIS — G4089 Other seizures: Principal | ICD-10-CM | POA: Diagnosis present

## 2017-01-06 DIAGNOSIS — Z79899 Other long term (current) drug therapy: Secondary | ICD-10-CM

## 2017-01-06 DIAGNOSIS — I69354 Hemiplegia and hemiparesis following cerebral infarction affecting left non-dominant side: Secondary | ICD-10-CM | POA: Diagnosis not present

## 2017-01-06 DIAGNOSIS — Z7901 Long term (current) use of anticoagulants: Secondary | ICD-10-CM | POA: Diagnosis not present

## 2017-01-06 DIAGNOSIS — R531 Weakness: Secondary | ICD-10-CM | POA: Diagnosis not present

## 2017-01-06 DIAGNOSIS — R2990 Unspecified symptoms and signs involving the nervous system: Secondary | ICD-10-CM

## 2017-01-06 DIAGNOSIS — I69392 Facial weakness following cerebral infarction: Secondary | ICD-10-CM | POA: Diagnosis not present

## 2017-01-06 DIAGNOSIS — I10 Essential (primary) hypertension: Secondary | ICD-10-CM | POA: Diagnosis present

## 2017-01-06 DIAGNOSIS — Z823 Family history of stroke: Secondary | ICD-10-CM

## 2017-01-06 DIAGNOSIS — K219 Gastro-esophageal reflux disease without esophagitis: Secondary | ICD-10-CM | POA: Diagnosis not present

## 2017-01-06 LAB — APTT: APTT: 27 s (ref 24–36)

## 2017-01-06 LAB — COMPREHENSIVE METABOLIC PANEL
ALBUMIN: 3.9 g/dL (ref 3.5–5.0)
ALT: 19 U/L (ref 14–54)
AST: 30 U/L (ref 15–41)
Alkaline Phosphatase: 124 U/L (ref 38–126)
Anion gap: 9 (ref 5–15)
BUN: 15 mg/dL (ref 6–20)
CHLORIDE: 107 mmol/L (ref 101–111)
CO2: 24 mmol/L (ref 22–32)
CREATININE: 0.86 mg/dL (ref 0.44–1.00)
Calcium: 9.4 mg/dL (ref 8.9–10.3)
GFR calc Af Amer: >60 mL/min (ref >60)
GFR calc non Af Amer: 60 mL/min — ABNORMAL LOW (ref >60)
GLUCOSE: 85 mg/dL (ref 65–99)
POTASSIUM: 3.6 mmol/L (ref 3.5–5.1)
Sodium: 140 mmol/L (ref 135–145)
Total Bilirubin: 0.6 mg/dL (ref 0.3–1.2)
Total Protein: 6.6 g/dL (ref 6.5–8.1)

## 2017-01-06 LAB — I-STAT CHEM 8, ED
BUN: 19 mg/dL (ref 6–20)
CHLORIDE: 105 mmol/L (ref 101–111)
CREATININE: 0.8 mg/dL (ref 0.44–1.00)
Calcium, Ion: 1.18 mmol/L (ref 1.15–1.40)
Glucose, Bld: 86 mg/dL (ref 65–99)
HEMATOCRIT: 42 % (ref 36.0–46.0)
Hemoglobin: 14.3 g/dL (ref 12.0–15.0)
POTASSIUM: 3.6 mmol/L (ref 3.5–5.1)
SODIUM: 142 mmol/L (ref 135–145)
TCO2: 29 mmol/L (ref 22–32)

## 2017-01-06 LAB — I-STAT TROPONIN, ED: Troponin i, poc: 0 ng/mL (ref 0.00–0.08)

## 2017-01-06 LAB — DIFFERENTIAL
BASOS ABS: 0 10*3/uL (ref 0.0–0.1)
BASOS PCT: 0 %
EOS ABS: 0.2 10*3/uL (ref 0.0–0.7)
Eosinophils Relative: 2 %
LYMPHS ABS: 1.1 10*3/uL (ref 0.7–4.0)
Lymphocytes Relative: 17 %
MONOS PCT: 10 %
Monocytes Absolute: 0.6 10*3/uL (ref 0.1–1.0)
NEUTROS ABS: 4.5 10*3/uL (ref 1.7–7.7)
NEUTROS PCT: 71 %

## 2017-01-06 LAB — CBC
HEMATOCRIT: 42.1 % (ref 36.0–46.0)
HEMOGLOBIN: 13.7 g/dL (ref 12.0–15.0)
MCH: 29.7 pg (ref 26.0–34.0)
MCHC: 32.5 g/dL (ref 30.0–36.0)
MCV: 91.1 fL (ref 78.0–100.0)
Platelets: 133 10*3/uL — ABNORMAL LOW (ref 150–400)
RBC: 4.62 MIL/uL (ref 3.87–5.11)
RDW: 13.7 % (ref 11.5–15.5)
WBC: 6.4 10*3/uL (ref 4.0–10.5)

## 2017-01-06 LAB — CBG MONITORING, ED: Glucose-Capillary: 80 mg/dL (ref 65–99)

## 2017-01-06 LAB — PROTIME-INR
INR: 1.12
Prothrombin Time: 14.3 seconds (ref 11.4–15.2)

## 2017-01-06 MED ORDER — GABAPENTIN 100 MG PO CAPS
200.0000 mg | ORAL_CAPSULE | Freq: Every day | ORAL | Status: DC
  Administered 2017-01-06 – 2017-01-07 (×2): 200 mg via ORAL
  Filled 2017-01-06 (×2): qty 2

## 2017-01-06 MED ORDER — NICARDIPINE HCL IN NACL 20-0.86 MG/200ML-% IV SOLN
3.0000 mg/h | Freq: Once | INTRAVENOUS | Status: AC
  Administered 2017-01-06: 5 mg/h via INTRAVENOUS
  Filled 2017-01-06: qty 200

## 2017-01-06 MED ORDER — LEVETIRACETAM 500 MG PO TABS
1000.0000 mg | ORAL_TABLET | Freq: Once | ORAL | Status: AC
  Administered 2017-01-06: 1000 mg via ORAL
  Filled 2017-01-06: qty 2

## 2017-01-06 MED ORDER — ASPIRIN EC 81 MG PO TBEC
81.0000 mg | DELAYED_RELEASE_TABLET | Freq: Every day | ORAL | Status: DC
  Administered 2017-01-07 – 2017-01-08 (×2): 81 mg via ORAL
  Filled 2017-01-06 (×2): qty 1

## 2017-01-06 MED ORDER — APIXABAN 2.5 MG PO TABS
2.5000 mg | ORAL_TABLET | Freq: Two times a day (BID) | ORAL | Status: DC
  Administered 2017-01-06 – 2017-01-08 (×4): 2.5 mg via ORAL
  Filled 2017-01-06 (×4): qty 1

## 2017-01-06 MED ORDER — LOSARTAN POTASSIUM 50 MG PO TABS
50.0000 mg | ORAL_TABLET | Freq: Every day | ORAL | Status: DC
  Administered 2017-01-06 – 2017-01-08 (×3): 50 mg via ORAL
  Filled 2017-01-06 (×3): qty 1

## 2017-01-06 MED ORDER — LORAZEPAM 2 MG/ML IJ SOLN: 0.5000 mg | Freq: Once | INTRAMUSCULAR | Status: DC | PRN

## 2017-01-06 MED ORDER — AMIODARONE HCL 200 MG PO TABS
100.0000 mg | ORAL_TABLET | Freq: Every day | ORAL | Status: DC
  Administered 2017-01-07 – 2017-01-08 (×2): 100 mg via ORAL
  Filled 2017-01-06 (×2): qty 1

## 2017-01-06 MED ORDER — ATORVASTATIN CALCIUM 40 MG PO TABS
40.0000 mg | ORAL_TABLET | Freq: Every day | ORAL | Status: DC
  Administered 2017-01-06 – 2017-01-07 (×2): 40 mg via ORAL
  Filled 2017-01-06 (×2): qty 1

## 2017-01-06 MED ORDER — SODIUM CHLORIDE 0.9 % IV SOLN
1000.0000 mg | Freq: Once | INTRAVENOUS | Status: DC
  Filled 2017-01-06: qty 10

## 2017-01-06 MED ORDER — HYDRALAZINE HCL 20 MG/ML IJ SOLN
5.0000 mg | INTRAMUSCULAR | Status: DC | PRN
  Administered 2017-01-06: 5 mg via INTRAVENOUS
  Filled 2017-01-06: qty 1

## 2017-01-06 MED ORDER — SODIUM CHLORIDE 0.9 % IV SOLN
500.0000 mg | Freq: Two times a day (BID) | INTRAVENOUS | Status: DC
  Administered 2017-01-07: 500 mg via INTRAVENOUS
  Filled 2017-01-06 (×3): qty 5

## 2017-01-06 MED ORDER — GABAPENTIN 100 MG PO CAPS
100.0000 mg | ORAL_CAPSULE | Freq: Every day | ORAL | Status: DC
  Administered 2017-01-07 – 2017-01-08 (×2): 100 mg via ORAL
  Filled 2017-01-06 (×2): qty 1

## 2017-01-06 NOTE — Code Documentation (Signed)
83yo female arriving to [facility] via [place] at [facility].  Patient from nursing facility where she was LKW at [facility].  Staff report patient with new onset left facial droop and facial twitching.  EMS called and activated a code stroke for left facial droop.  Of note, patient with h/o stroke with left sided residual deficits.  Patient takes Eliquis.  Stroke team at the bedside on patient arrival.  Labs drawn and patient cleared for CT by Dr. Thomasene Lot.  Patient to CT with team.  CT completed.  NIHSS 1, see documentation for details and code stroke times.  Patient with mild left facial droop on exam.  Patient with increased tone in left extremities on exam.  Patient reports noticing facial twitching as well as possible arm twitching that resolved.  Patient with h/o similar presentation.  Patient with suspected seizure.  Patient is contraindicated for tPA d/t taking Eliquis.  No acute stroke treatment at this time.  Code stroke canceled.  Bedside handoff with ED RN Burman Nieves.

## 2017-01-06 NOTE — H&P (Signed)
Date: 01/06/2017               Patient Name:  Virginia Meyer MRN: [id number]  DOB: 1928-10-18 Age / Sex: 83 y.o., female   PCP: Reymundo Poll, MD         Medical Service: Internal Medicine Teaching Service         Attending Physician: Dr. Evette Doffing, Mallie Mussel, *    First Contact: Virginia Meyer Pager: [phone]  Second Contact: Dr. Philipp Ovens Pager: [phone]       After Hours (After 5p/  First Contact Pager: [phone]  weekends / holidays): Second Contact Pager: [phone]   Chief Complaint: Left facial droop  History of Present Illness: Virginia Meyer is a 83 y.o. Female with a PMHx significant for HTN, HLD, and prior right MCA infarct with residual left arm / leg weakness and left facial droop who presented to the ED with worsening of her residual deficits. This morning at 8:30 AM she was in her usual state of health when nursing staff noted some left facial twitching, worsening of her left facial droop, and worsening left weakness of her arm/leg. She states that she has never had a seizure before. She did no loss consciousness, have bowel incontinences, bite her tongue, or appear to have experienced a post-ictal state. She reports being admitted several times over the past year for elevated BP. She has not been ill recently and denies any cough, abdominal pain, dysuria, or increased urinary frequency. She has not taken any of her medications this AM.    Per [place] [place]: This AM found to have uncontrollable left facial twitching and left side weakness. Has had multiple episodes worsening left sided facial droop and weakness over the past week. Refuses to go to the hospital. Patient quickly returns to baseline. Never losses consciousness, not alerted post episodes, is fatigued after. Incontinent at baseline so difficult to tell. Not been sick recently.   ED Course: She was found to be afebrile and hemodynamically stable but her BP was found to be elevated to 263/76. BMP and CBC were unremarkable.  CT head was obtained that illustrated no acute findings but a large remote right MCA territory infarct and chronic small vessel ischemia. Neurology was consulted for code stroke.   Meds:  Current Meds  Medication Sig  . apixaban (ELIQUIS) 2.5 MG TABS tablet Take 1 tablet (2.5 mg total) by mouth 2 (two) times daily.  Marland Kitchen atorvastatin (LIPITOR) 40 MG tablet Take 1 tablet (40 mg total) by mouth daily at 6 PM.  . capsaicin (ZOSTRIX) 0.025 % cream Apply 1 application at bedtime topically. APPLY BILATERAL TO FEET AT BEDTIME FOR PAIN  . gabapentin (NEURONTIN) 100 MG capsule Take 1 capsule (100 mg total) by mouth 2 (two) times daily. 100 mg in the morning and 200 mg at bedtime  . losartan (COZAAR) 50 MG tablet Take 1 tablet (50 mg total) by mouth daily.  . Menthol, Topical Analgesic, (BIOFREEZE) 4 % GEL Apply 1 application topically See admin instructions. FOUR TIMES A DAY FOR BILATERAL KNEE PAIN  . sertraline (ZOLOFT) 25 MG tablet Take 25 mg by mouth daily.  . traMADol (ULTRAM) 50 MG tablet Take 25 mg by mouth every 8 (eight) hours as needed (for pain).   . [DISCONTINUED] amiodarone (PACERONE) 100 MG tablet Take 1 tablet (100 mg total) by mouth daily.   Allergies: Allergies as of 01/06/2017  . (No Known Allergies)   Past Medical History:  Diagnosis Date  . Anxiety   .  Fall   . GERD (gastroesophageal reflux disease)   . Heart failure ([place])   . HLD (hyperlipidemia)   . HTN (hypertension)   . Hyperlipidemia   . Hypotension   . Major depressive disorder   . Neuropathy   . PVD (peripheral vascular disease) ([place])   . Stroke [facility])    left sided deficit  . UTI (urinary tract infection)    Family History: Mother: + DM, CVA  Social History:  Lives in assisted living  Denies the use of tobacco, EtOH, or illicit drug use   Review of Systems: A complete ROS was negative except as per HPI.   Physical Exam: Blood pressure (!) 174/69, pulse 67, temperature (!) 97.4 F (36.3 C), resp. rate 20,  SpO2 97 %.  General: Thin female in no acute distress HENT: Normocephalic, atraumatic, no icterus, pupils equal and reactive, no lacerations on the tongue  Pulm: Good air movement with no wheezing or crackles  CV: RRR, systolic murmur that radiates to the carotids Abdomen: Active bowel sounds, soft, non-distended, no tenderness to palpation  Extremities: No LE edema, warm and dry  Skin: No rashes noted Neuro:  - Cranial nerves: Slight left facial droop otherwise intact  - Motor: 5/5 in left upper and lower extremity. 5/5 in right upper and lower extremity  - Sensation: Sensation to touch intact in all extremities   EKG: personally reviewed: My interpretation is sinus with a regular rhythm, normal axis, and normal intervals.   CT Head: 1. No acute finding when compared to prior. 2. Large remote right MCA territory infarct. 3. Extensive chronic small vessel ischemia.  Assessment & Plan by Problem: Active Problems:   Seizure ([place])  1. Worsening Left Sided Weakness. [name] is a 83 y.o. Female with a PMHx significant for HTN, HLD, and prior right MCA infarct with residual left arm / leg weakness and left facial droop who presented to the ED with worsening of her residual deficits and facial twitching. Her symptoms have since returned to baseline. Initial imaging and labs have been unremarkable. Her BP on arrival was >260/70 it is possible that she had worsening of prior deficits due to hypertensive emergency. She does not appear to have any other metabolic derangements or infection to explain her acute worsening of symptoms. Given her recurrent history of motor symptoms followed by fatigue it is also possible she is having focal motor seizures leading to post-ictal paralysis.  - Given ASA - Neurology consulted, loaded patient with 1 g Keppra followed by 500 mg Keppra IV  - Will get MRI head and if negative start working on BP control  - Admit to tele - Frequent neuro checks  - NPO until  speech evaluation   2. Hypertension  - On losartan 50 mg QD  - Will hold off on aggressive BP control until acute CVA is completely ruled out   3. Atrial Fibrillation  - Self reported, appears to be rhythm controlled with amiodarone 100 mg QD - On Eliquis for anticoagulation   4. Prior Right MCA territory CVA  - Continue atorvastatin   5. Neuropathy  - Continue gabapentin   Diet: NPO until swallow eval  VTE ppx: Patient on Eliquis  Code Status: DNR  Dispo: Admit patient to Observation with expected length of stay less than 2 midnights.  Signed: Ina Homes, MD 01/06/2017, 2:33 PM  My Pager: [phone]

## 2017-01-06 NOTE — Progress Notes (Signed)
Patient arrived to floor.

## 2017-01-06 NOTE — ED Triage Notes (Signed)
Pt arrives via EMS from [facility] off [address] with concerns of a stroke. Staff reports pt had left facial droop and twitching. Staff reports pt has had a prior stroke that left pt with left side weakness.

## 2017-01-06 NOTE — ED Provider Notes (Signed)
[facility] EMERGENCY DEPARTMENT Provider Note   CSN: [id number] Arrival date & time: 01/06/17  1036   An emergency department physician performed an initial assessment on this suspected stroke patient at 1036([email or web address]).  History   Chief Complaint Chief Complaint  Patient presents with  . Code Stroke    HPI Virginia Meyer is a 83 y.o. female.  HPI   Patient is a 83 year old female brought in as a code stroke.  EMS actually called me from the field to determine whether it was code stroke or not.  Patient has recent history of hypertensive emergency causing left-sided worsening symptoms.  Patient has a history of left stroke and the deficits are very mild.  Staff at the nursing home reports that it gets worse every once in a while.  Today it got worse about 3 hours ago.  With worsening left facial droop and left arm weakness.  On arrival here patient did have left facial droop and left arm weakness.  Patient initial vital signs show blood pressure of 263/76.  Past Medical History:  Diagnosis Date  . Anxiety   . Fall   . GERD (gastroesophageal reflux disease)   . Heart failure ([place])   . HLD (hyperlipidemia)   . HTN (hypertension)   . Hyperlipidemia   . Hypotension   . Major depressive disorder   . Neuropathy   . PVD (peripheral vascular disease) ([place])   . Stroke [facility])    left sided deficit  . UTI (urinary tract infection)     Patient Active Problem List   Diagnosis Date Noted  . Transient alteration of awareness   . Labile blood pressure   . Hypertensive urgency 10/27/2016  . Decreased responsiveness   . Bradycardia 10/19/2016  . Hypotension 10/19/2016  . Skull lesion 10/19/2016  . History of cerebrovascular accident (CVA) with residual deficit 10/19/2016  . Personal history of atrial flutter 10/19/2016  . Thoracic aortic atherosclerosis ([place]) 10/19/2016  . Intracranial atherosclerosis 10/19/2016  . Frailty syndrome in geriatric patient 10/19/2016   . Acute kidney injury ([place]) 10/19/2016  . 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 Syncope, Recurrent 10/18/2016  . Carotid artery disease ([place]) 07/06/2016  . Gait abnormality 05/13/2016  . Cerebrovascular accident (CVA) due to thrombosis of right anterior cerebral artery ([place]) 05/13/2016  . History of heart block 05/07/2016  . Benign labile hypertension 08/21/2015  . Acute CVA (cerebrovascular accident) ([place]) 08/04/2015  . Left hemiparesis ([place]) 08/04/2015  . Primary osteoarthritis of left knee 08/04/2015  . Esophageal reflux 08/04/2015  . Hyperlipidemia LDL goal <70 08/04/2015  . Paroxysmal atrial fibrillation ([place]) 08/04/2015  . PVD (peripheral vascular disease) ([place]) 08/04/2015  . Protein-calorie malnutrition ([place]) 08/04/2015    Past Surgical History:  Procedure Laterality Date  . ABDOMINAL HYSTERECTOMY    . FOOT NEUROMA SURGERY    . SPINE SURGERY    . TONSILLECTOMY      OB History    No data available       Home Medications    Prior to Admission medications   Medication Sig Start Date End Date Taking? Authorizing Provider  amiodarone (PACERONE) 100 MG tablet Take 1 tablet (100 mg total) by mouth daily. 10/22/16   Carlyle Dolly, MD  apixaban (ELIQUIS) 2.5 MG TABS tablet Take 1 tablet (2.5 mg total) by mouth 2 (two) times daily. 10/16/15   Rosalin Hawking, MD  atorvastatin (LIPITOR) 40 MG tablet Take 1 tablet (40 mg total) by mouth daily at  6 PM. 10/21/16   Everrett Coombe, MD  gabapentin (NEURONTIN) 100 MG capsule Take 1 capsule (100 mg total) by mouth 2 (two) times daily. 100 mg in the morning and 200 mg at bedtime 10/29/16   Nuala Alpha, DO  losartan (COZAAR) 50 MG tablet Take 1 tablet (50 mg total) by mouth daily. 10/22/16   Carlyle Dolly, MD  meclizine (ANTIVERT) 12.5 MG tablet Take 12.5 mg by mouth every 8 (eight) hours as needed for dizziness. Not to exceed 3 doses/24 hours    [provider]  Menthol, Topical Analgesic,  (BIOFREEZE) 4 % GEL Apply 1 application topically See admin instructions. FOUR TIMES A DAY FOR BILATERAL KNEE PAIN    [provider]  polyethylene glycol (MIRALAX / GLYCOLAX) packet Take 17 g by mouth daily as needed for mild constipation. To be mixed with 6 ounces of fluid    [provider]  sertraline (ZOLOFT) 25 MG tablet Take 25 mg by mouth daily.    [provider]  traMADol (ULTRAM) 50 MG tablet Take 25 mg by mouth every 8 (eight) hours as needed (for pain).     [provider]    Family History Family History  Problem Relation Age of Onset  . Heart attack Brother   . Stroke Neg Hx   . Neuropathy Neg Hx     Social History Social History   Tobacco Use  . Smoking status: Never Smoker  . Smokeless tobacco: Never Used  Substance Use Topics  . Alcohol use: No    Alcohol/week: 0.0 oz  . Drug use: No     Allergies   Patient has no known allergies.   Review of Systems Review of Systems  Constitutional: Negative for activity change.  Respiratory: Negative for shortness of breath.   Cardiovascular: Negative for chest pain.  Gastrointestinal: Negative for abdominal pain.  Neurological: Positive for weakness.  All other systems reviewed and are negative.    Physical Exam Updated Vital Signs BP (!) 263/76   Pulse 65   Resp 14   SpO2 97%   Physical Exam  Constitutional: She is oriented to person, place, and time. She appears well-developed and well-nourished.  Elderly female in no acute distress.  HENT:  Head: Normocephalic and atraumatic.  Eyes: Right eye exhibits no discharge. Left eye exhibits no discharge.  Cardiovascular: Normal rate, regular rhythm and normal heart sounds.  No murmur heard. Pulmonary/Chest: Effort normal and breath sounds normal. She has no wheezes. She has no rales.  Abdominal: Soft. She exhibits no distension. There is no tenderness.  Neurological: She is oriented to person, place, and time.  Left  facial droop.  Mild pronation on pronator drift.  On left.  . Normal sensation bilaterally. Speech comprehensible, no slurring. Facial nerve tested and appears otherwise grossly normal. Alert and oriented 3.   Skin: Skin is warm and dry. She is not diaphoretic.  Psychiatric: She has a normal mood and affect.  Nursing note and vitals reviewed.    ED Treatments / Results  Labs (all labs ordered are listed, but only abnormal results are displayed) Labs Reviewed  CBC - Abnormal; Notable for the following components:      Result Value   Platelets 133 (*)    All other components within normal limits  PROTIME-INR  APTT  DIFFERENTIAL  COMPREHENSIVE METABOLIC PANEL  I-STAT TROPONIN, ED  I-STAT CHEM 8, ED  CBG MONITORING, ED    EKG  EKG Interpretation None  Radiology Ct Head Code Stroke Wo Contrast  Result Date: 01/06/2017 CLINICAL DATA:  Code stroke. Left-sided deficit including facial droop. EXAM: CT HEAD WITHOUT CONTRAST TECHNIQUE: Contiguous axial images were obtained from the base of the skull through the vertex without intravenous contrast. COMPARISON:  10/27/2016 FINDINGS: [place]: Large remote right MCA territory infarct, most dense along the insula and sylvian fissure. Chronic small vessel ischemia with gliosis throughout the cerebral white matter and in the bilateral thalamus. Ex vacuo ventriculomegaly. No hemorrhage. Vascular: Atherosclerotic calcification.  No hyperdense vessel. Skull: Stable heterogeneous calvarium which may be partially from osteopenia. Focal lucency in the left frontal bone best attributed to hemangioma. Sinuses/Orbits: Bilateral cataract resection.  No acute finding. Other: These results were called by telephone at the time of interpretation on 01/06/2017 at 10:51 am to Dr. Zenovia Jarred , who verbally acknowledged these results. ASPECTS [facility] Stroke Program Early CT Score) Not scored with the extensive chronic change. IMPRESSION: 1. No acute  finding when compared to prior. 2. Large remote right MCA territory infarct. 3. Extensive chronic small vessel ischemia. Electronically Signed   By: Monte Fantasia M.D.   On: 01/06/2017 10:52    Procedures Procedures (including critical care time)  CRITICAL CARE Performed by: Gardiner Sleeper Total critical care time: 60 minutes Critical care time was exclusive of separately billable procedures and treating other patients. Critical care was necessary to treat or prevent imminent or life-threatening deterioration. Critical care was time spent personally by me on the following activities: development of treatment plan with patient and/or surrogate as well as nursing, discussions with consultants, evaluation of patient's response to treatment, examination of patient, obtaining history from patient or surrogate, ordering and performing treatments and interventions, ordering and review of laboratory studies, ordering and review of radiographic studies, pulse oximetry and re-evaluation of patient's condition.  Medications Ordered in ED Medications  nicardipine (CARDENE) 20mg  in 0.86% saline 237ml IV infusion (0.1 mg/ml) (not administered)  levETIRAcetam (KEPPRA) 1,000 mg in sodium chloride 0.9 % 100 mL IVPB (not administered)     Initial Impression / Assessment and Plan / ED Course  I have reviewed the triage vital signs and the nursing notes.  Pertinent labs & imaging results that were available during my care of the patient were reviewed by me and considered in my medical decision making (see chart for details).     Patient is a 83 year old female brought in as a code stroke.  EMS actually called me from the field to determine whether it was code stroke or not.  Patient has recent history of hypertensive emergency causing left-sided worsening symptoms.  Patient has a history of left stroke and the deficits are very mild.  Staff at the nursing home reports that it gets worse every once in a  while.  Today it got worse about 3 hours ago.  With worsening left facial droop and left arm weakness.  On arrival here patient did have left facial droop and left arm weakness.  Patient initial vital signs show blood pressure of 263/76.  11:30 AM According to additional history, patient has some twitching for the weakness started.  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  Of note patient's blood pressure was incredibly elevated on arrival back to the room.  This could also represent  hypertensive emergency.  Will acutely lower patient's blood pressure, give her a Keppra load, and admit for her neurologic  changes in the setting of hypertension.  Final Clinical Impressions(s) / ED Diagnoses   Final diagnoses:  None    ED Discharge Orders    None       Mackuen, Fredia Sorrow, MD 01/08/17 1524

## 2017-01-06 NOTE — Consult Note (Signed)
Requesting Physician: Dr. Thomasene Lot    Chief Complaint: increased left facial droop  History obtained from:  Patient    HPI:                                                                                                                                         Virginia Meyer is an 83 y.o. female who has a known history of large right MCA infarct.  She has residual weakness on the left arm and leg and has residual left facial droop.  Due to the residual weakness she does have some increased residual tone on the left arm and leg.  Today at 8:30 in the morning she was known well soon after apparently staff at the nursing home noted some twitching of the left face and then increased left facial droop.  EMS arrived at the facility they did not know any left facial twitching but they did note that she had increased left facial droop.  While in route the facial droop seemed to improve.  Patient underwent CT of head which was negative for any acute bleed or stroke.  Patient is on Eliquis is not a TPA candidate.  During exam patient's NIH stroke scale was a 1 for left facial droop.  Date last known well: Date: 01/06/2017 Time last known well: Time: 08:30 tPA Given: No: resolving symptom Modified Rankin: Rankin Score=3  NIHSS--1  I  Past Medical History:  Diagnosis Date  . Anxiety   . Fall   . GERD (gastroesophageal reflux disease)   . Heart failure ([place])   . HLD (hyperlipidemia)   . HTN (hypertension)   . Hyperlipidemia   . Hypotension   . Major depressive disorder   . Neuropathy   . PVD (peripheral vascular disease) ([place])   . Stroke [facility])    left sided deficit  . UTI (urinary tract infection)     Past Surgical History:  Procedure Laterality Date  . ABDOMINAL HYSTERECTOMY    . FOOT NEUROMA SURGERY    . SPINE SURGERY    . TONSILLECTOMY      Family History  Problem Relation Age of Onset  . Heart attack Brother   . Stroke Neg Hx   . Neuropathy Neg Hx    Social History:  reports that   has never smoked. she has never used smokeless tobacco. She reports that she does not drink alcohol or use drugs.  Allergies: No Known Allergies  Medications:  No current facility-administered medications for this encounter.    Current Outpatient Medications  Medication Sig Dispense Refill  . amiodarone (PACERONE) 100 MG tablet Take 1 tablet (100 mg total) by mouth daily. 30 tablet 0  . apixaban (ELIQUIS) 2.5 MG TABS tablet Take 1 tablet (2.5 mg total) by mouth 2 (two) times daily. 60 tablet 2  . atorvastatin (LIPITOR) 40 MG tablet Take 1 tablet (40 mg total) by mouth daily at 6 PM. 30 tablet 0  . gabapentin (NEURONTIN) 100 MG capsule Take 1 capsule (100 mg total) by mouth 2 (two) times daily. 100 mg in the morning and 200 mg at bedtime    . losartan (COZAAR) 50 MG tablet Take 1 tablet (50 mg total) by mouth daily. 30 tablet 0  . meclizine (ANTIVERT) 12.5 MG tablet Take 12.5 mg by mouth every 8 (eight) hours as needed for dizziness. Not to exceed 3 doses/24 hours    . Menthol, Topical Analgesic, (BIOFREEZE) 4 % GEL Apply 1 application topically See admin instructions. FOUR TIMES A DAY FOR BILATERAL KNEE PAIN    . polyethylene glycol (MIRALAX / GLYCOLAX) packet Take 17 g by mouth daily as needed for mild constipation. To be mixed with 6 ounces of fluid    . sertraline (ZOLOFT) 25 MG tablet Take 25 mg by mouth daily.    . traMADol (ULTRAM) 50 MG tablet Take 25 mg by mouth every 8 (eight) hours as needed (for pain).        ROS:                                                                                                                                       History obtained from the patient  General ROS: negative for - chills, fatigue, fever, night sweats, weight gain or weight loss Psychological ROS: negative for - behavioral disorder, hallucinations, memory difficulties,  mood swings or suicidal ideation Ophthalmic ROS: negative for - blurry vision, double vision, eye pain or loss of vision ENT ROS: negative for - epistaxis, nasal discharge, oral lesions, sore throat, tinnitus or vertigo Allergy and Immunology ROS: negative for - hives or itchy/watery eyes Hematological and Lymphatic ROS: negative for - bleeding problems, bruising or swollen lymph nodes Endocrine ROS: negative for - galactorrhea, hair pattern changes, polydipsia/polyuria or temperature intolerance Respiratory ROS: negative for - cough, hemoptysis, shortness of breath or wheezing Cardiovascular ROS: negative for - chest pain, dyspnea on exertion, edema or irregular heartbeat Gastrointestinal ROS: negative for - abdominal pain, diarrhea, hematemesis, nausea/vomiting or stool incontinence Genito-Urinary ROS: negative for - dysuria, hematuria, incontinence or urinary frequency/urgency Musculoskeletal ROS: negative for - joint swelling or muscular weakness Neurological ROS: as noted in HPI Dermatological ROS: negative for rash and skin lesion changes  Neurologic Examination:  There were no vitals taken for this visit.  HEENT-  Normocephalic, no lesions, without obvious abnormality.  Normal external eye and conjunctiva.  Normal TM's bilaterally.  Normal auditory canals and external ears. Normal external nose, mucus membranes and septum.  Normal pharynx. Cardiovascular- S1, S2 normal, pulses palpable throughout   Lungs- chest clear, no wheezing, rales, normal symmetric air entry Abdomen- normal findings: bowel sounds normal Extremities- no edema Lymph-no adenopathy palpable Musculoskeletal-no joint tenderness, deformity or swelling Skin-warm and dry, no hyperpigmentation, vitiligo, or suspicious lesions  Neurological Examination Mental Status: Alert, oriented, thought content appropriate.  Speech  fluent without evidence of aphasia.  Able to follow 3 step commands without difficulty. Cranial Nerves: II:  Visual fields grossly normal,  III,IV, VI: ptosis not present, extra-ocular motions intact bilaterally, pupils equal, round, reactive to light and accommodation V,VII: smile asymmetric on the left, facial light touch sensation normal bilaterally VIII: hearing normal bilaterally IX,X: uvula rises symmetrically XI: bilateral shoulder shrug XII: midline tongue extension Motor: Right : Upper extremity   5/5    Left:     Upper extremity   5/5  Lower extremity   5/5     Lower extremity   5/5 Crease tone noted on the left arm and leg  sensory: Pinprick and light touch intact throughout, bilaterally Deep Tendon Reflexes: 2+ and symmetric throughout upper extremities with no knee jerk or ankle jerk Plantars: Right: downgoing   Left: downgoing Cerebellar: Not tested due to increased tone on the left however was normal on the right Gait: Not tested       Lab Results: Basic Metabolic Panel: Recent Labs  Lab 01/06/17 1046  NA 142  K 3.6  CL 105  GLUCOSE 86  BUN 19  CREATININE 0.80    Liver Function Tests: No results for input(s): AST, ALT, ALKPHOS, BILITOT, PROT, ALBUMIN in the last 168 hours. No results for input(s): LIPASE, AMYLASE in the last 168 hours. No results for input(s): AMMONIA in the last 168 hours.  CBC: Recent Labs  Lab 01/06/17 1040 01/06/17 1046  WBC 6.4  --   NEUTROABS 4.5  --   HGB 13.7 14.3  HCT 42.1 42.0  MCV 91.1  --   PLT 133*  --     Cardiac Enzymes: No results for input(s): CKTOTAL, CKMB, CKMBINDEX, TROPONINI in the last 168 hours.  Lipid Panel: No results for input(s): CHOL, TRIG, HDL, CHOLHDL, VLDL, LDLCALC in the last 168 hours.  CBG: No results for input(s): GLUCAP in the last 168 hours.  Microbiology: Results for orders placed or performed during the hospital encounter of 10/27/16  Urine culture     Status: Abnormal    Collection Time: 10/27/16  6:09 PM  Result Value Ref Range Status   Specimen Description URINE, CATHETERIZED  Final   Special Requests Normal  Final   Culture MULTIPLE SPECIES PRESENT, SUGGEST RECOLLECTION (A)  Final   Report Status 10/28/2016 FINAL  Final    Coagulation Studies: No results for input(s): LABPROT, INR in the last 72 hours.  Imaging: Ct Head Code Stroke Wo Contrast  Result Date: 01/06/2017 CLINICAL DATA:  Code stroke. Left-sided deficit including facial droop. EXAM: CT HEAD WITHOUT CONTRAST TECHNIQUE: Contiguous axial images were obtained from the base of the skull through the vertex without intravenous contrast. COMPARISON:  10/27/2016 FINDINGS: [place]: Large remote right MCA territory infarct, most dense along the insula and sylvian fissure. Chronic small vessel ischemia with gliosis throughout the cerebral white matter and in the  bilateral thalamus. Ex vacuo ventriculomegaly. No hemorrhage. Vascular: Atherosclerotic calcification.  No hyperdense vessel. Skull: Stable heterogeneous calvarium which may be partially from osteopenia. Focal lucency in the left frontal bone best attributed to hemangioma. Sinuses/Orbits: Bilateral cataract resection.  No acute finding. Other: These results were called by telephone at the time of interpretation on 01/06/2017 at 10:51 am to Dr. Zenovia Jarred , who verbally acknowledged these results. ASPECTS [facility] Stroke Program Early CT Score) Not scored with the extensive chronic change. IMPRESSION: 1. No acute finding when compared to prior. 2. Large remote right MCA territory infarct. 3. Extensive chronic small vessel ischemia. Electronically Signed   By: Monte Fantasia M.D.   On: 01/06/2017 10:52       Assessment and plan discussed with with attending physician and they are in agreement.    Etta Quill PA-C [facility] [phone]  01/06/2017, 10:57 AM   Assessment: 83 y.o. female presenting to the emergency department  with increased left facial droop and history of left facial twitching and left arm twitching prior to EMS arrival.  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  Given her severe hypertension, would also consider pres.  Stroke Risk Factors - hyperlipidemia and hypertension  Recommend: 1) load patient with 1 g of Keppra IV followed by 500 mg Keppra maintenance. 2) MRI [place] 3) hypertension control  Roland Rack, MD [facility] [phone]  If 7pm- 7am, please page neurology on call as listed in [place].

## 2017-01-07 ENCOUNTER — Other Ambulatory Visit: Payer: Self-pay

## 2017-01-07 DIAGNOSIS — Z8673 Personal history of transient ischemic attack (TIA), and cerebral infarction without residual deficits: Secondary | ICD-10-CM

## 2017-01-07 DIAGNOSIS — I4891 Unspecified atrial fibrillation: Secondary | ICD-10-CM

## 2017-01-07 DIAGNOSIS — Z79899 Other long term (current) drug therapy: Secondary | ICD-10-CM

## 2017-01-07 DIAGNOSIS — I10 Essential (primary) hypertension: Secondary | ICD-10-CM

## 2017-01-07 DIAGNOSIS — G629 Polyneuropathy, unspecified: Secondary | ICD-10-CM

## 2017-01-07 DIAGNOSIS — Z7901 Long term (current) use of anticoagulants: Secondary | ICD-10-CM

## 2017-01-07 DIAGNOSIS — R569 Unspecified convulsions: Secondary | ICD-10-CM

## 2017-01-07 LAB — MRSA PCR SCREENING: MRSA by PCR: NEGATIVE

## 2017-01-07 MED ORDER — LEVETIRACETAM 500 MG PO TABS
500.0000 mg | ORAL_TABLET | Freq: Two times a day (BID) | ORAL | Status: DC
  Administered 2017-01-07 – 2017-01-08 (×2): 500 mg via ORAL
  Filled 2017-01-07 (×2): qty 1

## 2017-01-07 NOTE — Progress Notes (Signed)
   Subjective: Doing well this AM. Just finished working with PT. She is in a wheelchair at home, so PT just helped her transfer and she did well. She feels that she is back to her baseline and has not experienced any more twitching or weakness. She would like to leave. Was able to tolerate PO intake this AM without difficulty. We will discuss with neurology. All questions and concerns addressed.   Objective: Vital signs in last 24 hours: Vitals:   01/07/17 0050 01/07/17 0250 01/07/17 0308 01/07/17 0445  BP: (!) 181/61 (!) 171/148 (!) 175/66 (!) 147/49  Pulse: 61 60 (!) 59   Resp: (!) 22 20 20 20   Temp: 98 F (36.7 C) 98 F (36.7 C)    TempSrc: Oral Oral    SpO2: 95% 97% 98% 97%  Weight:      Height:       General: Thin female in no acute distress Pulm: Good air movement with no wheezing or crackles CV: RRR, no murmurs, no rubs  Abdomen: Active bowel sounds, soft, no tenderness to palpation  Extremities: No LE edema  Assessment/Plan:  1. Seizure  - CT head and MRI head stable without acute changes  - Started on Keppra by neurology, appears to be tolerating the medication appropriately  - Will touch base with neurology today  - Restarted BP medications   2. Hypertension  - On losartan 50 mg QD   3. Atrial Fibrillation  - Rhythm controlled with amiodarone 100 mg QD - On Eliquis for anticoagulation   4. Prior Right MCA territory CVA  - Continue atorvastatin   5. Neuropathy  - Continue gabapentin   Dispo: Anticipated discharge in approximately 0-1 day(s).   Ina Homes, MD 01/07/2017, 6:25 AM My Pager: [phone]

## 2017-01-07 NOTE — Evaluation (Signed)
Physical Therapy Evaluation Patient Details Name: Virginia Meyer MRN: [id number] DOB: Jul 27, 1928 Today's Date: 01/07/2017   History of Present Illness  83 y.o. Female with a PMHx significant for HTN, HLD, and prior right MCA infarct with residual left arm / leg weakness and left facial droop who presented to the ED with worsening of her residual deficits. This morning at 8:30 AM she was in her usual state of health when nursing staff noted some left facial twitching, worsening of her left facial droop, and worsening left weakness of her arm/leg. She states that she has never had a seizure before. She did not lose consciousness, have bowel incontinences, bite her tongue, or appear to have experienced a post-ictal state  Clinical Impression  Pt admitted with above diagnosis. Pt currently with functional limitations due to the deficits listed below (see PT Problem List). Pt is limited in her mobility by weakness and decreased ROM from previous CVA. PTA pt was performing pivot transfers to [facility] with assistance. Pt currently modA for bed mobility and squat pivot transfer to recliner. Pt believed to be very close to baseline level of function. Pt currently participating in outpatient PT 2x/wk and should continue that therapy. PT will continue to follow pt acutely to maintain strength and ROM. Pt will benefit from skilled PT to increase their independence and safety with mobility to allow discharge to the venue listed below.       Follow Up Recommendations Outpatient PT;Other (comment);SNF(pt participating in outpatient PT 2x/wk at [facility])    Equipment Recommendations  None recommended by PT    Recommendations for Other Services       Precautions / Restrictions Precautions Precautions: Fall Restrictions Weight Bearing Restrictions: No      Mobility  Bed Mobility Overal bed mobility: Needs Assistance Bed Mobility: Supine to Sit     Supine to sit: Mod assist     General bed  mobility comments: modA for trunk to upright and pad scoot of hip to EoB  Transfers Overall transfer level: Needs assistance Equipment used: 1 person hand held assist Transfers: Squat Pivot Transfers     Squat pivot transfers: Mod assist     General transfer comment: modA for power up and pivot to recliner, at baseline level of function as pt was performing pivot transfers to [facility] prior to hospitalization  Ambulation/Gait             General Gait Details: not able at baseline  Stairs            Wheelchair Mobility    Modified Rankin (Stroke Patients Only)       Balance Overall balance assessment: Needs assistance Sitting-balance support: Single extremity supported Sitting balance-Leahy Scale: Poor Sitting balance - Comments: requires single UE support to maintain Postural control: Right lateral lean                                   Pertinent Vitals/Pain Pain Assessment: No/denies pain    Home Living Family/patient expects to be discharged to:: Assisted living                      Prior Function Level of Independence: Needs assistance   Gait / Transfers Assistance Needed: transfers to [facility] for mobility  ADL's / Homemaking Assistance Needed: Assistance for ADLs, dressing, bathing        Hand Dominance   Dominant Hand: Right  Extremity/Trunk Assessment   Upper Extremity Assessment Upper Extremity Assessment: LUE deficits/detail LUE Deficits / Details: ROM lacks full shoulder flexion, elbow extension, strength grossly 2+/5  LUE Sensation: decreased light touch;decreased proprioception LUE Coordination: decreased fine motor;decreased gross motor    Lower Extremity Assessment Lower Extremity Assessment: LLE deficits/detail LLE Deficits / Details: hip and knee contractures and drop foot results of previous CVA, working with outpatient PT for increasing strength and ROM, strength grossly 3-/5,  LLE Sensation: decreased light  touch LLE Coordination: decreased fine motor;decreased gross motor       Communication   Communication: HOH  Cognition Arousal/Alertness: Awake/alert Behavior During Therapy: WFL for tasks assessed/performed Overall Cognitive Status: Within Functional Limits for tasks assessed                                        General Comments General comments (skin integrity, edema, etc.): BP in supine on entry 181/54, waited 5 minutes and reassessed at 166/71, after transfer to recliner 144/57        Assessment/Plan    PT Assessment Patient needs continued PT services  PT Problem List Decreased strength;Decreased range of motion;Decreased activity tolerance;Decreased balance;Decreased mobility;Decreased coordination;Cardiopulmonary status limiting activity;Impaired sensation;Impaired tone;Obesity       PT Treatment Interventions DME instruction;Functional mobility training;Therapeutic activities;Therapeutic exercise;Balance training;Neuromuscular re-education;Patient/family education    PT Goals (Current goals can be found in the Care Plan section)  Acute Rehab PT Goals Patient Stated Goal: go home PT Goal Formulation: With patient Potential to Achieve Goals: Fair    Frequency Min 2X/week    AM-PAC PT "6 Clicks" Daily Activity  Outcome Measure Difficulty turning over in bed (including adjusting bedclothes, sheets and blankets)?: Unable Difficulty moving from lying on back to sitting on the side of the bed? : Unable Difficulty sitting down on and standing up from a chair with arms (e.g., wheelchair, bedside commode, etc,.)?: Unable Help needed moving to and from a bed to chair (including a wheelchair)?: A Lot Help needed walking in hospital room?: Total Help needed climbing 3-5 steps with a railing? : Total 6 Click Score: 7    End of Session Equipment Utilized During Treatment: Gait belt Activity Tolerance: Patient tolerated treatment well Patient left: in  chair;with call bell/phone within reach;with chair alarm set Nurse Communication: Mobility status PT Visit Diagnosis: Unsteadiness on feet (R26.81);Other abnormalities of gait and mobility (R26.89);Muscle weakness (generalized) (M62.81);Difficulty in walking, not elsewhere classified (R26.2);Hemiplegia and hemiparesis Hemiplegia - Right/Left: Left Hemiplegia - dominant/non-dominant: Non-dominant Hemiplegia - caused by: Cerebral infarction    Time: 1761-6073 PT Time Calculation (min) (ACUTE ONLY): 30 min   Charges:     PT Treatments $Therapeutic Activity: 8-22 mins   PT G Codes:   PT G-Codes **NOT FOR INPATIENT CLASS** Functional Assessment Tool Used: AM-PAC 6 Clicks Basic Mobility Functional Limitation: Mobility: Walking and moving around Mobility: Walking and Moving Around Current Status (X1062): At least 80 percent but less than 100 percent impaired, limited or restricted Mobility: Walking and Moving Around Goal Status [phone]): At least 60 percent but less than 80 percent impaired, limited or restricted    Benjamine Mola B. Migdalia Dk PT, DPT Acute Rehabilitation  [phone] Pager [phone]    [place] 01/07/2017, 9:43 AM

## 2017-01-07 NOTE — Progress Notes (Signed)
Internal Medicine Attending:   I saw and examined the patient. I reviewed the resident's note and I agree with the resident's findings and plan as documented in the resident's note.  83 year old woman with a history of a right MCA infarction several years ago and persistent left arm and leg deficits admitted with worsening weakness and left facial droop.  MRI has ruled out new ischemic infarctions, no bleeding, and ruled out PRES. Neurology consulted and is treating for likely seizure activity as the cause of the symptoms.  Patient feels well today, back to her baseline.  Sitting up in the chair, worked with physical therapy, can transfer with assistance but not walk which apparently is normal for her.  Would like to observe her one more day on antiepileptics to make sure there are no new side effects or recurrent seizures.  If she does well, plan to transfer back to assisted living facility over the weekend.

## 2017-01-07 NOTE — Progress Notes (Signed)
Received patient from [place]. Patient is alert and oriented, denies pain at this time. Another blanket given for warmth. Safety maintain.  Will continue to monitor.

## 2017-01-07 NOTE — Progress Notes (Signed)
Patient refused labs

## 2017-01-07 NOTE — Care Management Note (Signed)
Case Management Note  Patient Details  Name: Virginia Meyer MRN: [id number] Date of Birth: 1928/11/15  Subjective/Objective:    Pt in with seizure from [facility].                Action/Plan: Plan is for patient to return to [place] when medically ready. CSW spoke to [employer] and they will need d/c summary to state to continue therapies at the ALF after d/c. MD made aware. No HH or outpatient orders needed. Son to provide transportation to facility. Daughter in room and updated.   Expected Discharge Date:                  Expected Discharge Plan:  Assisted Living / Rest Home  In-House Referral:  Clinical Social Work  Discharge planning Services  CM Consult  Post Acute Care Choice:    Choice offered to:     DME Arranged:    DME Agency:     HH Arranged:    HH Agency:     Status of Service:  In process, will continue to follow  If discussed at Long Length of Stay Meetings, dates discussed:    Additional Comments:  Pollie Friar, RN 01/07/2017, 4:10 PM

## 2017-01-07 NOTE — Progress Notes (Signed)
Subjective: No further events overnight.  Patient does not feel as though the medication is making her significantly drowsy may be mildly drowsy but no other side effects  Exam: Vitals:   01/07/17 0445 01/07/17 1042  BP: (!) 147/49 (!) 148/47  Pulse:  (!) 52  Resp: 20 18  Temp:  97.9 F (36.6 C)  SpO2: 97% 98%    HEENT-  Normocephalic, no lesions, without obvious abnormality.  Normal external eye and conjunctiva.  Normal TM's bilaterally.  Normal auditory canals and external ears. Normal external nose, mucus membranes and septum.  Normal pharynx. Cardiovascular- S1, S2 normal, pulses palpable throughout   Lungs- chest clear, no wheezing, rales, normal symmetric air entry Abdomen- normal findings: bowel sounds normal Extremities- no edema  Neuro:  CN: Pupils are equal and round. They are symmetrically reactive from 3-->2 mm. EOMI without nystagmus. Facial sensation is intact to light touch. Face has a left facial droop at rest (old ) with normal strength and mobility. Hearing is intact to conversational voice. Palate elevates symmetrically and uvula is midline. Voice is normal in tone, pitch and quality. Bilateral SCM and trapezii are 5/5. Tongue is midline with normal bulk and mobility.  Motor: Normal bulk, tone, and strength. 5/5 throughout. No drift.  Sensation: Intact to light touch.  DTRs: 2+, symmetric upper extremities with no lower extremity deep tendon reflexes toes downgoing bilaterally. No pathologic reflexes.   Medications:  Scheduled: . amiodarone  100 mg Oral Daily  . apixaban  2.5 mg Oral BID  . aspirin EC  81 mg Oral Daily  . atorvastatin  40 mg Oral q1800  . gabapentin  100 mg Oral Q breakfast  . gabapentin  200 mg Oral QHS  . losartan  50 mg Oral Daily    Pertinent Labs/Diagnostics:   Mr [place] Wo Contrast  Result Date: 01/06/2017 CLINICAL DATA:  Seizure EXAM: MRI HEAD WITHOUT CONTRAST TECHNIQUE: Multiplanar, multiecho pulse sequences of the [place] and  surrounding structures were obtained without intravenous contrast. COMPARISON:  Head CT 01/06/2017, 01/07/2016 [place] MRI 10/18/2016 FINDINGS: [place]: Partially empty sella. There is no acute infarct or acute hemorrhage. No mass lesion, hydrocephalus, dural abnormality or extra-axial collection. Large area of encephalomalacia within the posterior right middle cerebral artery distribution. There is diffuse confluent hyperintense T2-weighted signal within the periventricular, deep and juxtacortical white matter, most often seen in the setting of chronic microvascular ischemia. Ex vacuo dilatation of the lateral ventricles. No chronic microhemorrhage or superficial siderosis. Vascular: Normal flow voids. Skull and upper cervical spine: Unchanged appearance of hyperintense T2-weighted signal lesion in the left frontal bone. Small right frontal bone lesion is also unchanged. Sinuses/Orbits: Negative. Other: None. IMPRESSION: 1. No acute intracranial abnormality. 2. Large area of encephalomalacia in the right posterior MCA territory, unchanged. Severe sequelae of chronic ischemic microangiopathy. 3. 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 Given the lack of change over time, these are probably benign lesions, but a neoplastic for marrow replacement process would be difficult to exclude. Electronically Signed   By: Ulyses Jarred M.D.   On: 01/06/2017 18:47   Ct Head Code Stroke Wo Contrast  Result Date: 01/06/2017 CLINICAL DATA:  Code stroke. Left-sided deficit including facial droop. EXAM: CT HEAD WITHOUT CONTRAST TECHNIQUE: Contiguous axial images were obtained from the base of the skull through the vertex without intravenous contrast. COMPARISON:  10/27/2016 FINDINGS: [place]: Large remote  right MCA territory infarct, most dense along the insula and sylvian fissure. Chronic small  vessel ischemia with gliosis throughout the cerebral white matter and in the bilateral thalamus. Ex vacuo ventriculomegaly. No hemorrhage. Vascular: Atherosclerotic calcification.  No hyperdense vessel. Skull: Stable heterogeneous calvarium which may be partially from osteopenia. Focal lucency in the left frontal bone best attributed to hemangioma. Sinuses/Orbits: Bilateral cataract resection.  No acute finding. Other: These results were called by telephone at the time of interpretation on 01/06/2017 at 10:51 am to Dr. Zenovia Jarred , who verbally acknowledged these results. ASPECTS [facility] Stroke Program Early CT Score) Not scored with the extensive chronic change. IMPRESSION: 1. No acute finding when compared to prior. 2. Large remote right MCA territory infarct. 3. Extensive chronic small vessel ischemia. Electronically Signed   By: Monte Fantasia M.D.   On: 01/06/2017 10:52     Etta Quill PA-C [facility] [phone]  Impression: 83 year old female presented emergency department with increased left facial droop in the setting of left facial twitching and left arm twitching prior to EMS arrival.  MRI [place] did not show PRES or any acute infarcts although on initial arrival her blood pressure was severely elevated.  While in the hospital patient's blood pressure has been normalized.   Recommendations: 1) continue Keppra 500 mg p.o. twice daily 2) patient will need to follow-up with neurologist as an outpatient  Neurology will sign off at this time  01/07/2017, 11:11 AM

## 2017-01-07 NOTE — Progress Notes (Signed)
Received bedside report from [facility]. Patient appears comfortable, with no complaints of pain. SCDs on, armband verified. Called and placed patient on telemetry with a second verifier.  Patient is extremely HOH, with her hearing aids out. Unable to do admission history d/t this. Nose swabbed and sent to lab. Have been in touch with Dr Jerold Coombe regarding patients BP. Hydralizine ordered PRN and one dose given.  Patient asleep right now. Will monitor every 2 hours. Bed alarm on. Safety maintained.

## 2017-01-07 NOTE — Progress Notes (Signed)
Patient's BP high/ patient moving around, not keeping arm still. Retook BP with arm still, and it was 171/66. Patient sleeping, with legs curled up. No apparent pain. Will continue to monitor.

## 2017-01-07 NOTE — Clinical Social Work Note (Signed)
Clinical Social Work Assessment  Patient Details  Name: Virginia Meyer MRN: [id number] Date of Birth: [birth year]  Date of referral:  01/07/17               Reason for consult:  Discharge Planning                Permission sought to share information with:  Family Supports Permission granted to share information::  Yes, Verbal Permission Granted  Name::     [name]  Agency::  [facility]  Relationship::  daughter  Contact Information:  [phone]  Housing/Transportation Living arrangements for the past 2 months:  [place] of Information:  Patient Patient Interpreter Needed:    Criminal Activity/Legal Involvement Pertinent to Current Situation/Hospitalization:    Significant Relationships:  Adult Children Lives with:  Facility Resident Do you feel safe going back to the place where you live?  Yes Need for family participation in patient care:  Yes (Comment)  Care giving concerns:  No family at bedside. Patient stated she has support from adult daughter and from ALF   Social Worker assessment / plan:  CSW met [facility] at bedside to offer support and discuss discharge needs. Patient confirm that she is from [facility] and would like to return back to facility. Patient stated she has been at facility for 1 1/2 years and they have been good to her. Patient stated her daughter will take her back.  Employment status:  Retired Nurse, adult PT Recommendations:  No Follow Up(pt already getting outpatient pt) Information / Referral to community resources:  Other (Comment Required)  Patient/Family's Response to care:  Patient verbalized appreciation for CSW role in care  Patient/Family's Understanding of and Emotional Response to Diagnosis, Current Treatment, and Prognosis:  Patient eager to go home   Emotional Assessment Appearance:  Appears stated age Attitude/Demeanor/Rapport:  Other Affect (typically observed):   Calm Orientation:  Oriented to Self, Oriented to Place, Oriented to  Time, Oriented to Situation Alcohol / Substance use:  Not Applicable Psych involvement (Current and /or in the community):     Discharge Needs  Concerns to be addressed:  No discharge needs identified Readmission within the last 30 days:  No Current discharge risk:  None Barriers to Discharge:  No Barriers Identified   Wende Neighbors, LCSW 01/07/2017, 2:26 PM

## 2017-01-08 DIAGNOSIS — I69354 Hemiplegia and hemiparesis following cerebral infarction affecting left non-dominant side: Secondary | ICD-10-CM

## 2017-01-08 DIAGNOSIS — I161 Hypertensive emergency: Secondary | ICD-10-CM

## 2017-01-08 DIAGNOSIS — I69392 Facial weakness following cerebral infarction: Secondary | ICD-10-CM

## 2017-01-08 DIAGNOSIS — Z7982 Long term (current) use of aspirin: Secondary | ICD-10-CM

## 2017-01-08 MED ORDER — LOSARTAN POTASSIUM 100 MG PO TABS: 100.0000 mg | ORAL_TABLET | Freq: Every day | ORAL | 1 refills | Status: DC

## 2017-01-08 MED ORDER — HYDROCHLOROTHIAZIDE 12.5 MG PO CAPS
12.5000 mg | ORAL_CAPSULE | Freq: Every day | ORAL | Status: DC
  Administered 2017-01-08: 12.5 mg via ORAL
  Filled 2017-01-08: qty 1

## 2017-01-08 MED ORDER — ASPIRIN 81 MG PO TBEC: 81.0000 mg | DELAYED_RELEASE_TABLET | Freq: Every day | ORAL | 2 refills | Status: DC

## 2017-01-08 MED ORDER — LEVETIRACETAM 500 MG PO TABS: 500.0000 mg | ORAL_TABLET | Freq: Two times a day (BID) | ORAL | 1 refills | Status: AC

## 2017-01-08 MED ORDER — HYDROCHLOROTHIAZIDE 12.5 MG PO CAPS: 12.5000 mg | ORAL_CAPSULE | Freq: Every day | ORAL | 1 refills | Status: DC

## 2017-01-08 MED ORDER — LOSARTAN POTASSIUM 50 MG PO TABS: 100.0000 mg | ORAL_TABLET | Freq: Every day | ORAL | Status: DC

## 2017-01-08 MED ORDER — LOSARTAN POTASSIUM 50 MG PO TABS
50.0000 mg | ORAL_TABLET | Freq: Once | ORAL | Status: AC
  Administered 2017-01-08: 50 mg via ORAL
  Filled 2017-01-08: qty 1

## 2017-01-08 MED ORDER — AMIODARONE HCL 100 MG PO TABS: 100.0000 mg | ORAL_TABLET | Freq: Every day | ORAL | 1 refills | Status: AC

## 2017-01-08 NOTE — Progress Notes (Signed)
Discharge to: [facility] Anticipated discharge date: 01/08/17 Transportation by: Family car (son to transport)  Report #: [phone]  CSW signing off.  Laveda Abbe LCSW [phone]

## 2017-01-08 NOTE — Discharge Summary (Signed)
Name: Virginia Meyer MRN: [id number] DOB: 13-Jul-1928 83 y.o. PCP: Reymundo Poll, MD  Date of Admission: 01/06/2017 10:40 AM Date of Discharge: 01/08/2017 Attending Physician: Axel Filler, *  Discharge Diagnosis: 1. Seizure 2. Hypertensive Emergency   Discharge Medications: Allergies as of 01/08/2017   No Known Allergies     Medication List    TAKE these medications   amiodarone 100 MG tablet Commonly known as:  PACERONE Take 1 tablet (100 mg total) daily by mouth. Start taking on:  01/09/2017   apixaban 2.5 MG Tabs tablet Commonly known as:  ELIQUIS Take 1 tablet (2.5 mg total) by mouth 2 (two) times daily.   aspirin 81 MG EC tablet Take 1 tablet (81 mg total) daily by mouth. Start taking on:  01/09/2017   atorvastatin 40 MG tablet Commonly known as:  LIPITOR Take 1 tablet (40 mg total) by mouth daily at 6 PM.   BIOFREEZE 4 % Gel Generic drug:  Menthol (Topical Analgesic) Apply 1 application topically See admin instructions. FOUR TIMES A DAY FOR BILATERAL KNEE PAIN   capsaicin 0.025 % cream Commonly known as:  ZOSTRIX Apply 1 application at bedtime topically. APPLY BILATERAL TO FEET AT BEDTIME FOR PAIN   gabapentin 100 MG capsule Commonly known as:  NEURONTIN Take 1 capsule (100 mg total) by mouth 2 (two) times daily. 100 mg in the morning and 200 mg at bedtime   hydrochlorothiazide 12.5 MG capsule Commonly known as:  MICROZIDE Take 1 capsule (12.5 mg total) daily by mouth. Start taking on:  01/09/2017   levETIRAcetam 500 MG tablet Commonly known as:  KEPPRA Take 1 tablet (500 mg total) 2 (two) times daily by mouth.   losartan 100 MG tablet Commonly known as:  COZAAR Take 1 tablet (100 mg total) daily by mouth. Start taking on:  01/09/2017 What changed:    medication strength  how much to take   meclizine 12.5 MG tablet Commonly known as:  ANTIVERT Take 12.5 mg by mouth every 8 (eight) hours as needed for dizziness. Not to exceed 3  doses/24 hours   polyethylene glycol packet Commonly known as:  MIRALAX / GLYCOLAX Take 17 g by mouth daily as needed for mild constipation. To be mixed with 6 ounces of fluid   sertraline 25 MG tablet Commonly known as:  ZOLOFT Take 25 mg by mouth daily.   traMADol 50 MG tablet Commonly known as:  ULTRAM Take 25 mg by mouth every 8 (eight) hours as needed (for pain).       Disposition and follow-up:   Ms.Jamesyn Worrell was discharged from [facility] in Stable condition.  At the hospital follow up visit please address:  1.  Seizure: Discharged on Keppra 500 mg BID. Please ensure patient has outpatient neurology follow up scheduled.   2. HTN: BP 263/76 on admission. Improved with Cardene drip in ED and resumption of home losartan 50 mg daily, but persistently elevated throughout hospitalization 341P-379K systolic. Losartan was increased to 100 mg daily and HCTZ 12.5 mg daily was added on discharge. Please follow up BP and up titrate meds as needed.   3.  Labs / imaging needed at time of follow-up: BMP   4.  Pending labs/ test needing follow-up: None  Follow-up Appointments: Follow-up Information    Reymundo Poll, MD. Schedule an appointment as soon as possible for a visit in 1 week(s).   Specialty:  Family Medicine Contact information: [place]. [address] [place] [address]  [place] by problem list:  1. 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 She denied LOC, bowel/bladder incontinence, and post ictal confusion. Per nursing facility, patient was found the morning of admission to have uncontrollable left facial twitching and left sided weakness. She reportedly had multiple similar episodes over the  past week. Patient previously refused to go to the hospital and she would quickly return to baseline. CT head and MRI [place] were negative for acute infarct or bleed. Neurology was consulted who felt symptoms were consistent with seizure activity, likely sequela from her prior stroke. She was loaded with IV keppra in the ED and discharged on Keppra 500 mg PO BID. Plan for outpatient neurology follow up.   2. HTN Emergency: BP on arrival was 263/76. She was started on a cardene drip and BP down trended appropriately. Once CT/MRI ruled out stroke, she was restarted on her home losartan for BP control. She remained persistently uncontrolled throughout hospitalization. HCTZ 12.5 mg daily was added and losartan was increased to 100 mg daily. Please follow up BP and check renal function at hospital follow up. Patient has been hospitalized multiple times in the past year for uncontrolled HTN.   3. Hx Right MCA CVA: Atorvastatin 40 mg daily was continued. ASA 81 daily was started for secondary prevention.   Discharge Vitals:   BP (!) 171/64 (BP Location: Right Arm)   Pulse 63   Temp 98.4 F (36.9 C) (Oral)   Resp 17   Ht 5' (1.524 m)   Wt 129 lb 12.8 oz (58.9 kg)   SpO2 96%   BMI 25.35 kg/m   Pertinent Labs, Studies, and Procedures:   01/06/2017 CT Head w/o Contrast: IMPRESSION: 1. No acute finding when compared to prior. 2. Large remote right MCA territory infarct. 3. Extensive chronic small vessel ischemia.  01/06/2017 MRI [place] w/o Contrast:  IMPRESSION: 1. No acute intracranial abnormality. 2. Large area of encephalomalacia in the right posterior MCA territory, unchanged. Severe sequelae of chronic ischemic microangiopathy. 3. 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 Given the lack of change over time, these are probably benign lesions, but  a neoplastic for marrow replacement process would be difficult to exclude.  Discharge Instructions: Discharge Instructions    Call MD for:  difficulty breathing, headache or visual disturbances   Complete by:  As directed    Call MD for:  persistant dizziness or light-headedness   Complete by:  As directed    Diet - low sodium heart healthy   Complete by:  As directed    Discharge instructions   Complete by:  As directed    Ms. Rosser,  It was a pleasure taking care of you. For your seizures, neurology has started a new medicine called Keppra. Please take this twice a day. I have also added another medication for your blood pressure. Please take hydrochlorothiazide once daily. Follow up with your primary care provider in 1-2 weeks. Thank you!  - Dr. Philipp Ovens   Increase activity slowly   Complete by:  As directed       Signed: Velna Ochs, MD 01/08/2017, 3:06  PM   Pager: [phone]

## 2017-01-08 NOTE — Progress Notes (Signed)
   Subjective: Doing well this am, sitting up eating breakfast. Eager for discharge. Tolerating keppra well without side effects.   Objective:  Vital signs in last 24 hours: Vitals:   01/07/17 1435 01/07/17 2019 01/08/17 0022 01/08/17 0447  BP: (!) 177/62 (!) 183/73 (!) 161/69 (!) 157/65  Pulse: 61 66 79 75  Resp: 16 18 18 18   Temp: 98.2 F (36.8 C) 98.4 F (36.9 C) 98.6 F (37 C) 98.5 F (36.9 C)  TempSrc: Oral Oral Oral Oral  SpO2: 97% 95% 96% 97%  Weight:      Height:       General: Thin female in no acute distress Pulm: Good air movement with no wheezing or crackles CV: RRR, no murmurs, no rubs  Abdomen: Active bowel sounds, soft, no tenderness to palpation  Extremities: No LE edema  Assessment/Plan:  83 yo F presenting from SNF with acute worsening of her baseline left sided facial droop and facial twitching.  1. Seizure  - CT head and MRI head stable without acute changes  - Started on Keppra by neurology, 500 mg BID, appears to be tolerating the medication appropriately  - Stable for DC per neurology with outpatient follow up  2. Hypertension: BP 263/76 on admission, improved with home meds but persistently uncontrolled 001V - 494W systolic over the past 24 hours. - Continue losartan 50 mg QD  - Added HCTZ 12.5 mg daily   3. Atrial Fibrillation  - Rhythm controlled with amiodarone100 mg QD - Continue Eliquis for anticoagulation   4. Prior Right MCA territory CVA - Continue atorvastatin   5. Neuropathy  - Continue gabapentin   Dispo: Anticipated discharge today.   Velna Ochs, MD 01/08/2017, 7:11 AM Pager: [phone]

## 2017-01-08 NOTE — NC FL2 (Signed)
[place] LEVEL OF CARE SCREENING TOOL     IDENTIFICATION  Patient Name: Virginia Meyer Birthdate: [birth year] Sex: female Admission Date (Current Location): 01/06/2017  [facility] and [place] Number:  Herbalist and Address:  [facility]. [facility], [place] [address] [place], [place] [address]      Provider Number: [id number]  Attending Physician Name and Address:  Axel Filler, *  Relative Name and Phone Number:       Current Level of Care: Hospital Recommended Level of Care: [place] Prior Approval Number:    Date Approved/Denied:   PASRR Number:    Discharge Plan: Other (Comment)(ALF)    Current Diagnoses: Patient Active Problem List   Diagnosis Date Noted  . Seizures ([place]) 01/07/2017  . Seizure ([place]) 01/06/2017  . Transient alteration of awareness   . Labile blood pressure   . Hypertensive urgency 10/27/2016  . Decreased responsiveness   . Bradycardia 10/19/2016  . Hypotension 10/19/2016  . Skull lesion 10/19/2016  . History of cerebrovascular accident (CVA) with residual deficit 10/19/2016  . Personal history of atrial flutter 10/19/2016  . Thoracic aortic atherosclerosis ([place]) 10/19/2016  . Intracranial atherosclerosis 10/19/2016  . Frailty syndrome in geriatric patient 10/19/2016  . Acute kidney injury ([place]) 10/19/2016  . 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 Syncope, Recurrent 10/18/2016  . Carotid artery disease ([place]) 07/06/2016  . Gait abnormality 05/13/2016  . Cerebrovascular accident (CVA) due to thrombosis of right anterior cerebral artery ([place]) 05/13/2016  . History of heart block 05/07/2016  . Benign labile hypertension 08/21/2015  . Acute CVA (cerebrovascular accident) ([place]) 08/04/2015  . Left hemiparesis ([place]) 08/04/2015  . Primary osteoarthritis of left knee 08/04/2015  . Esophageal reflux 08/04/2015  . Hyperlipidemia LDL goal <70 08/04/2015  .  Paroxysmal atrial fibrillation ([place]) 08/04/2015  . PVD (peripheral vascular disease) ([place]) 08/04/2015  . Protein-calorie malnutrition ([place]) 08/04/2015    Orientation RESPIRATION BLADDER Height & Weight     Self, Time, Situation, Place  Normal Incontinent Weight: 129 lb 12.8 oz (58.9 kg) Height:  5' (152.4 cm)  BEHAVIORAL SYMPTOMS/MOOD NEUROLOGICAL BOWEL NUTRITION STATUS    Convulsions/Seizures Incontinent    AMBULATORY STATUS COMMUNICATION OF NEEDS Skin   Extensive Assist Verbally Normal                       Personal Care Assistance Level of Assistance  Bathing, Dressing, Feeding Bathing Assistance: Maximum assistance Feeding assistance: Limited assistance Dressing Assistance: Maximum assistance     Functional Limitations Info  Sight, Hearing, Speech Sight Info: Adequate Hearing Info: Impaired Speech Info: Adequate    SPECIAL CARE FACTORS FREQUENCY  PT (By licensed PT), OT (By licensed OT)     PT Frequency: continue prior therapies OT Frequency: continue prior therapies            Contractures Contractures Info: Not present    Additional Factors Info  Code Status, Allergies, Isolation Precautions Code Status Info: DNR Allergies Info: NKA     Isolation Precautions Info: MRSA     Current Medications (01/08/2017):  This is the current hospital active medication list Current Facility-Administered Medications  Medication Dose Route Frequency Provider Last Rate Last Dose  . amiodarone (PACERONE) tablet 100 mg  100 mg Oral Daily Ina Homes, MD   100 mg at 01/08/17 0850  . apixaban (ELIQUIS) tablet 2.5 mg  2.5 mg Oral BID Velna Ochs, MD   2.5 mg at  01/08/17 0851  . aspirin EC tablet 81 mg  81 mg Oral Daily Velna Ochs, MD   81 mg at 01/08/17 0851  . atorvastatin (LIPITOR) tablet 40 mg  40 mg Oral q1800 Velna Ochs, MD   40 mg at 01/07/17 1721  . gabapentin (NEURONTIN) capsule 100 mg  100 mg Oral Q breakfast Velna Ochs, MD   100 mg  at 01/08/17 0850  . gabapentin (NEURONTIN) capsule 200 mg  200 mg Oral QHS Axel Filler, MD   200 mg at 01/07/17 2156  . hydrALAZINE (APRESOLINE) injection 5 mg  5 mg Intravenous Q4H PRN Nedrud, Larena Glassman, MD   5 mg at 01/06/17 2321  . hydrochlorothiazide (MICROZIDE) capsule 12.5 mg  12.5 mg Oral Daily Velna Ochs, MD   12.5 mg at 01/08/17 0851  . levETIRAcetam (KEPPRA) tablet 500 mg  500 mg Oral BID Marliss Coots, PA-C   500 mg at 01/08/17 4599  . LORazepam (ATIVAN) injection 0.5 mg  0.5 mg Intravenous Once PRN Velna Ochs, MD      . losartan (COZAAR) tablet 50 mg  50 mg Oral Daily Velna Ochs, MD   50 mg at 01/08/17 7741     Discharge Medications: Please see discharge summary for a list of discharge medications.  Relevant Imaging Results:  Relevant Lab Results:   Additional Information SS#: [id number]  Geralynn Ochs, LCSW

## 2017-01-08 NOTE — Progress Notes (Signed)
Vitals:   01/08/17 1200 01/08/17 1444  BP: (!) 194/59 (!) 171/64  Pulse:  63  Resp:  17  Temp:  98.4 F (36.9 C)  SpO2:  96%   Patient discharged after speaking with attending physician. BP decreased after second dose of losartan. Discharge instructions given to patient and her son. [place] called for report, there was no answer. I left a voicemail for staff to return my call. CSW states she spoke with attending nurse and all documents pertaining to patients encounter have been received.

## 2017-01-10 ENCOUNTER — Encounter: Payer: Self-pay | Admitting: Physical Therapy

## 2017-01-10 NOTE — Therapy (Unsigned)
[facility] [address] [place], [place], [address] Phone: [phone]   Fax:  [phone]  Patient Details  Name: Virginia Meyer MRN: [id number] Date of Birth: November 24, 1928 Referring Provider: Sarina Ill MD  Encounter Date: 01/10/2017  PHYSICAL THERAPY DISCHARGE SUMMARY  Visits from Start of Care:27   Current functional level related to goals / functional outcomes: Unable to assess; pt with medical decline and patient's daughter called to cancel all remaining appts   Remaining deficits: Unable to assess   Education / Equipment: NA  Plan: Patient agrees to discharge.  Patient goals were not met. Patient is being discharged due to meeting the stated rehab goals.  ?????      Rexanne Mano, PT 01/10/2017, 8:04 AM  [facility] [address] [place], [place], [address] Phone: [phone]   Fax:  [phone]

## 2017-01-11 ENCOUNTER — Ambulatory Visit: Payer: Medicare HMO | Admitting: Physical Therapy

## 2017-01-11 DIAGNOSIS — E876 Hypokalemia: Secondary | ICD-10-CM | POA: Diagnosis not present

## 2017-01-11 DIAGNOSIS — D696 Thrombocytopenia, unspecified: Secondary | ICD-10-CM | POA: Diagnosis not present

## 2017-01-11 DIAGNOSIS — G4089 Other seizures: Secondary | ICD-10-CM | POA: Diagnosis not present

## 2017-01-11 DIAGNOSIS — E785 Hyperlipidemia, unspecified: Secondary | ICD-10-CM | POA: Diagnosis not present

## 2017-01-11 DIAGNOSIS — R69 Illness, unspecified: Secondary | ICD-10-CM | POA: Diagnosis not present

## 2017-01-11 DIAGNOSIS — Z79899 Other long term (current) drug therapy: Secondary | ICD-10-CM | POA: Diagnosis not present

## 2017-01-11 DIAGNOSIS — I10 Essential (primary) hypertension: Secondary | ICD-10-CM | POA: Diagnosis not present

## 2017-01-11 DIAGNOSIS — Z993 Dependence on wheelchair: Secondary | ICD-10-CM | POA: Diagnosis not present

## 2017-01-11 DIAGNOSIS — Z8673 Personal history of transient ischemic attack (TIA), and cerebral infarction without residual deficits: Secondary | ICD-10-CM | POA: Diagnosis not present

## 2017-01-11 DIAGNOSIS — Z7901 Long term (current) use of anticoagulants: Secondary | ICD-10-CM | POA: Diagnosis not present

## 2017-01-14 DIAGNOSIS — I739 Peripheral vascular disease, unspecified: Secondary | ICD-10-CM | POA: Diagnosis not present

## 2017-01-14 DIAGNOSIS — M1712 Unilateral primary osteoarthritis, left knee: Secondary | ICD-10-CM | POA: Diagnosis not present

## 2017-01-14 DIAGNOSIS — I10 Essential (primary) hypertension: Secondary | ICD-10-CM | POA: Diagnosis not present

## 2017-01-14 DIAGNOSIS — I480 Paroxysmal atrial fibrillation: Secondary | ICD-10-CM | POA: Diagnosis not present

## 2017-01-14 DIAGNOSIS — G40909 Epilepsy, unspecified, not intractable, without status epilepticus: Secondary | ICD-10-CM | POA: Diagnosis not present

## 2017-01-14 DIAGNOSIS — I69354 Hemiplegia and hemiparesis following cerebral infarction affecting left non-dominant side: Secondary | ICD-10-CM | POA: Diagnosis not present

## 2017-01-14 DIAGNOSIS — N390 Urinary tract infection, site not specified: Secondary | ICD-10-CM | POA: Diagnosis not present

## 2017-01-14 DIAGNOSIS — Z7982 Long term (current) use of aspirin: Secondary | ICD-10-CM | POA: Diagnosis not present

## 2017-01-14 DIAGNOSIS — Z7901 Long term (current) use of anticoagulants: Secondary | ICD-10-CM | POA: Diagnosis not present

## 2017-01-17 DIAGNOSIS — I739 Peripheral vascular disease, unspecified: Secondary | ICD-10-CM | POA: Diagnosis not present

## 2017-01-17 DIAGNOSIS — Z7982 Long term (current) use of aspirin: Secondary | ICD-10-CM | POA: Diagnosis not present

## 2017-01-17 DIAGNOSIS — I69354 Hemiplegia and hemiparesis following cerebral infarction affecting left non-dominant side: Secondary | ICD-10-CM | POA: Diagnosis not present

## 2017-01-17 DIAGNOSIS — I10 Essential (primary) hypertension: Secondary | ICD-10-CM | POA: Diagnosis not present

## 2017-01-17 DIAGNOSIS — G40909 Epilepsy, unspecified, not intractable, without status epilepticus: Secondary | ICD-10-CM | POA: Diagnosis not present

## 2017-01-17 DIAGNOSIS — I480 Paroxysmal atrial fibrillation: Secondary | ICD-10-CM | POA: Diagnosis not present

## 2017-01-17 DIAGNOSIS — Z7901 Long term (current) use of anticoagulants: Secondary | ICD-10-CM | POA: Diagnosis not present

## 2017-01-17 DIAGNOSIS — M1712 Unilateral primary osteoarthritis, left knee: Secondary | ICD-10-CM | POA: Diagnosis not present

## 2017-01-18 ENCOUNTER — Ambulatory Visit: Payer: Medicare HMO | Admitting: Physical Therapy

## 2017-01-18 DIAGNOSIS — I10 Essential (primary) hypertension: Secondary | ICD-10-CM | POA: Diagnosis not present

## 2017-01-18 DIAGNOSIS — M1712 Unilateral primary osteoarthritis, left knee: Secondary | ICD-10-CM | POA: Diagnosis not present

## 2017-01-18 DIAGNOSIS — I480 Paroxysmal atrial fibrillation: Secondary | ICD-10-CM | POA: Diagnosis not present

## 2017-01-18 DIAGNOSIS — G40909 Epilepsy, unspecified, not intractable, without status epilepticus: Secondary | ICD-10-CM | POA: Diagnosis not present

## 2017-01-18 DIAGNOSIS — Z7901 Long term (current) use of anticoagulants: Secondary | ICD-10-CM | POA: Diagnosis not present

## 2017-01-18 DIAGNOSIS — Z7982 Long term (current) use of aspirin: Secondary | ICD-10-CM | POA: Diagnosis not present

## 2017-01-18 DIAGNOSIS — I69354 Hemiplegia and hemiparesis following cerebral infarction affecting left non-dominant side: Secondary | ICD-10-CM | POA: Diagnosis not present

## 2017-01-18 DIAGNOSIS — I739 Peripheral vascular disease, unspecified: Secondary | ICD-10-CM | POA: Diagnosis not present

## 2017-01-20 ENCOUNTER — Ambulatory Visit: Payer: Medicare HMO | Admitting: Physical Therapy

## 2017-01-20 DIAGNOSIS — I10 Essential (primary) hypertension: Secondary | ICD-10-CM | POA: Diagnosis not present

## 2017-01-20 DIAGNOSIS — Z7982 Long term (current) use of aspirin: Secondary | ICD-10-CM | POA: Diagnosis not present

## 2017-01-20 DIAGNOSIS — I739 Peripheral vascular disease, unspecified: Secondary | ICD-10-CM | POA: Diagnosis not present

## 2017-01-20 DIAGNOSIS — I69354 Hemiplegia and hemiparesis following cerebral infarction affecting left non-dominant side: Secondary | ICD-10-CM | POA: Diagnosis not present

## 2017-01-20 DIAGNOSIS — G40909 Epilepsy, unspecified, not intractable, without status epilepticus: Secondary | ICD-10-CM | POA: Diagnosis not present

## 2017-01-20 DIAGNOSIS — M1712 Unilateral primary osteoarthritis, left knee: Secondary | ICD-10-CM | POA: Diagnosis not present

## 2017-01-20 DIAGNOSIS — Z7901 Long term (current) use of anticoagulants: Secondary | ICD-10-CM | POA: Diagnosis not present

## 2017-01-20 DIAGNOSIS — I480 Paroxysmal atrial fibrillation: Secondary | ICD-10-CM | POA: Diagnosis not present

## 2017-01-24 DIAGNOSIS — F4322 Adjustment disorder with anxiety: Secondary | ICD-10-CM | POA: Diagnosis not present

## 2017-01-24 DIAGNOSIS — R451 Restlessness and agitation: Secondary | ICD-10-CM | POA: Diagnosis not present

## 2017-01-24 DIAGNOSIS — R69 Illness, unspecified: Secondary | ICD-10-CM | POA: Diagnosis not present

## 2017-01-24 DIAGNOSIS — G4700 Insomnia, unspecified: Secondary | ICD-10-CM | POA: Diagnosis not present

## 2017-01-24 DIAGNOSIS — F329 Major depressive disorder, single episode, unspecified: Secondary | ICD-10-CM | POA: Diagnosis not present

## 2017-01-25 DIAGNOSIS — I10 Essential (primary) hypertension: Secondary | ICD-10-CM | POA: Diagnosis not present

## 2017-01-25 DIAGNOSIS — Z7901 Long term (current) use of anticoagulants: Secondary | ICD-10-CM | POA: Diagnosis not present

## 2017-01-25 DIAGNOSIS — G40909 Epilepsy, unspecified, not intractable, without status epilepticus: Secondary | ICD-10-CM | POA: Diagnosis not present

## 2017-01-25 DIAGNOSIS — Z7982 Long term (current) use of aspirin: Secondary | ICD-10-CM | POA: Diagnosis not present

## 2017-01-25 DIAGNOSIS — M1712 Unilateral primary osteoarthritis, left knee: Secondary | ICD-10-CM | POA: Diagnosis not present

## 2017-01-25 DIAGNOSIS — I739 Peripheral vascular disease, unspecified: Secondary | ICD-10-CM | POA: Diagnosis not present

## 2017-01-25 DIAGNOSIS — I69354 Hemiplegia and hemiparesis following cerebral infarction affecting left non-dominant side: Secondary | ICD-10-CM | POA: Diagnosis not present

## 2017-01-25 DIAGNOSIS — I480 Paroxysmal atrial fibrillation: Secondary | ICD-10-CM | POA: Diagnosis not present

## 2017-01-26 DIAGNOSIS — Z7901 Long term (current) use of anticoagulants: Secondary | ICD-10-CM | POA: Diagnosis not present

## 2017-01-26 DIAGNOSIS — I69354 Hemiplegia and hemiparesis following cerebral infarction affecting left non-dominant side: Secondary | ICD-10-CM | POA: Diagnosis not present

## 2017-01-26 DIAGNOSIS — I480 Paroxysmal atrial fibrillation: Secondary | ICD-10-CM | POA: Diagnosis not present

## 2017-01-26 DIAGNOSIS — G40909 Epilepsy, unspecified, not intractable, without status epilepticus: Secondary | ICD-10-CM | POA: Diagnosis not present

## 2017-01-26 DIAGNOSIS — M1712 Unilateral primary osteoarthritis, left knee: Secondary | ICD-10-CM | POA: Diagnosis not present

## 2017-01-26 DIAGNOSIS — Z7982 Long term (current) use of aspirin: Secondary | ICD-10-CM | POA: Diagnosis not present

## 2017-01-26 DIAGNOSIS — I739 Peripheral vascular disease, unspecified: Secondary | ICD-10-CM | POA: Diagnosis not present

## 2017-01-26 DIAGNOSIS — I10 Essential (primary) hypertension: Secondary | ICD-10-CM | POA: Diagnosis not present

## 2017-01-27 DIAGNOSIS — M1712 Unilateral primary osteoarthritis, left knee: Secondary | ICD-10-CM | POA: Diagnosis not present

## 2017-01-27 DIAGNOSIS — Z7901 Long term (current) use of anticoagulants: Secondary | ICD-10-CM | POA: Diagnosis not present

## 2017-01-27 DIAGNOSIS — I69354 Hemiplegia and hemiparesis following cerebral infarction affecting left non-dominant side: Secondary | ICD-10-CM | POA: Diagnosis not present

## 2017-01-27 DIAGNOSIS — I739 Peripheral vascular disease, unspecified: Secondary | ICD-10-CM | POA: Diagnosis not present

## 2017-01-27 DIAGNOSIS — I480 Paroxysmal atrial fibrillation: Secondary | ICD-10-CM | POA: Diagnosis not present

## 2017-01-27 DIAGNOSIS — Z7982 Long term (current) use of aspirin: Secondary | ICD-10-CM | POA: Diagnosis not present

## 2017-01-27 DIAGNOSIS — I10 Essential (primary) hypertension: Secondary | ICD-10-CM | POA: Diagnosis not present

## 2017-01-27 DIAGNOSIS — G40909 Epilepsy, unspecified, not intractable, without status epilepticus: Secondary | ICD-10-CM | POA: Diagnosis not present

## 2017-01-28 DIAGNOSIS — Z7982 Long term (current) use of aspirin: Secondary | ICD-10-CM | POA: Diagnosis not present

## 2017-01-28 DIAGNOSIS — G40909 Epilepsy, unspecified, not intractable, without status epilepticus: Secondary | ICD-10-CM | POA: Diagnosis not present

## 2017-01-28 DIAGNOSIS — M1712 Unilateral primary osteoarthritis, left knee: Secondary | ICD-10-CM | POA: Diagnosis not present

## 2017-01-28 DIAGNOSIS — I739 Peripheral vascular disease, unspecified: Secondary | ICD-10-CM | POA: Diagnosis not present

## 2017-01-28 DIAGNOSIS — I69354 Hemiplegia and hemiparesis following cerebral infarction affecting left non-dominant side: Secondary | ICD-10-CM | POA: Diagnosis not present

## 2017-01-28 DIAGNOSIS — I480 Paroxysmal atrial fibrillation: Secondary | ICD-10-CM | POA: Diagnosis not present

## 2017-01-28 DIAGNOSIS — Z7901 Long term (current) use of anticoagulants: Secondary | ICD-10-CM | POA: Diagnosis not present

## 2017-01-28 DIAGNOSIS — I10 Essential (primary) hypertension: Secondary | ICD-10-CM | POA: Diagnosis not present

## 2017-02-01 DIAGNOSIS — I739 Peripheral vascular disease, unspecified: Secondary | ICD-10-CM | POA: Diagnosis not present

## 2017-02-01 DIAGNOSIS — I10 Essential (primary) hypertension: Secondary | ICD-10-CM | POA: Diagnosis not present

## 2017-02-01 DIAGNOSIS — G40909 Epilepsy, unspecified, not intractable, without status epilepticus: Secondary | ICD-10-CM | POA: Diagnosis not present

## 2017-02-01 DIAGNOSIS — Z7982 Long term (current) use of aspirin: Secondary | ICD-10-CM | POA: Diagnosis not present

## 2017-02-01 DIAGNOSIS — Z7901 Long term (current) use of anticoagulants: Secondary | ICD-10-CM | POA: Diagnosis not present

## 2017-02-01 DIAGNOSIS — I69354 Hemiplegia and hemiparesis following cerebral infarction affecting left non-dominant side: Secondary | ICD-10-CM | POA: Diagnosis not present

## 2017-02-01 DIAGNOSIS — M1712 Unilateral primary osteoarthritis, left knee: Secondary | ICD-10-CM | POA: Diagnosis not present

## 2017-02-01 DIAGNOSIS — I480 Paroxysmal atrial fibrillation: Secondary | ICD-10-CM | POA: Diagnosis not present

## 2017-02-03 DIAGNOSIS — Z85828 Personal history of other malignant neoplasm of skin: Secondary | ICD-10-CM | POA: Diagnosis not present

## 2017-02-03 DIAGNOSIS — L570 Actinic keratosis: Secondary | ICD-10-CM | POA: Diagnosis not present

## 2017-02-04 DIAGNOSIS — Z7901 Long term (current) use of anticoagulants: Secondary | ICD-10-CM | POA: Diagnosis not present

## 2017-02-04 DIAGNOSIS — I739 Peripheral vascular disease, unspecified: Secondary | ICD-10-CM | POA: Diagnosis not present

## 2017-02-04 DIAGNOSIS — I480 Paroxysmal atrial fibrillation: Secondary | ICD-10-CM | POA: Diagnosis not present

## 2017-02-04 DIAGNOSIS — M1712 Unilateral primary osteoarthritis, left knee: Secondary | ICD-10-CM | POA: Diagnosis not present

## 2017-02-04 DIAGNOSIS — G40909 Epilepsy, unspecified, not intractable, without status epilepticus: Secondary | ICD-10-CM | POA: Diagnosis not present

## 2017-02-04 DIAGNOSIS — I69354 Hemiplegia and hemiparesis following cerebral infarction affecting left non-dominant side: Secondary | ICD-10-CM | POA: Diagnosis not present

## 2017-02-04 DIAGNOSIS — I10 Essential (primary) hypertension: Secondary | ICD-10-CM | POA: Diagnosis not present

## 2017-02-04 DIAGNOSIS — Z7982 Long term (current) use of aspirin: Secondary | ICD-10-CM | POA: Diagnosis not present

## 2017-02-07 DIAGNOSIS — I480 Paroxysmal atrial fibrillation: Secondary | ICD-10-CM | POA: Diagnosis not present

## 2017-02-07 DIAGNOSIS — I739 Peripheral vascular disease, unspecified: Secondary | ICD-10-CM | POA: Diagnosis not present

## 2017-02-07 DIAGNOSIS — G40909 Epilepsy, unspecified, not intractable, without status epilepticus: Secondary | ICD-10-CM | POA: Diagnosis not present

## 2017-02-07 DIAGNOSIS — Z7901 Long term (current) use of anticoagulants: Secondary | ICD-10-CM | POA: Diagnosis not present

## 2017-02-07 DIAGNOSIS — I69354 Hemiplegia and hemiparesis following cerebral infarction affecting left non-dominant side: Secondary | ICD-10-CM | POA: Diagnosis not present

## 2017-02-07 DIAGNOSIS — I10 Essential (primary) hypertension: Secondary | ICD-10-CM | POA: Diagnosis not present

## 2017-02-07 DIAGNOSIS — M1712 Unilateral primary osteoarthritis, left knee: Secondary | ICD-10-CM | POA: Diagnosis not present

## 2017-02-07 DIAGNOSIS — Z7982 Long term (current) use of aspirin: Secondary | ICD-10-CM | POA: Diagnosis not present

## 2017-02-09 DIAGNOSIS — I69354 Hemiplegia and hemiparesis following cerebral infarction affecting left non-dominant side: Secondary | ICD-10-CM | POA: Diagnosis not present

## 2017-02-09 DIAGNOSIS — M1712 Unilateral primary osteoarthritis, left knee: Secondary | ICD-10-CM | POA: Diagnosis not present

## 2017-02-09 DIAGNOSIS — Z7901 Long term (current) use of anticoagulants: Secondary | ICD-10-CM | POA: Diagnosis not present

## 2017-02-09 DIAGNOSIS — I739 Peripheral vascular disease, unspecified: Secondary | ICD-10-CM | POA: Diagnosis not present

## 2017-02-09 DIAGNOSIS — I480 Paroxysmal atrial fibrillation: Secondary | ICD-10-CM | POA: Diagnosis not present

## 2017-02-09 DIAGNOSIS — G40909 Epilepsy, unspecified, not intractable, without status epilepticus: Secondary | ICD-10-CM | POA: Diagnosis not present

## 2017-02-09 DIAGNOSIS — Z7982 Long term (current) use of aspirin: Secondary | ICD-10-CM | POA: Diagnosis not present

## 2017-02-09 DIAGNOSIS — I10 Essential (primary) hypertension: Secondary | ICD-10-CM | POA: Diagnosis not present

## 2017-02-10 DIAGNOSIS — I480 Paroxysmal atrial fibrillation: Secondary | ICD-10-CM | POA: Diagnosis not present

## 2017-02-10 DIAGNOSIS — Z7982 Long term (current) use of aspirin: Secondary | ICD-10-CM | POA: Diagnosis not present

## 2017-02-10 DIAGNOSIS — Z7901 Long term (current) use of anticoagulants: Secondary | ICD-10-CM | POA: Diagnosis not present

## 2017-02-10 DIAGNOSIS — G40909 Epilepsy, unspecified, not intractable, without status epilepticus: Secondary | ICD-10-CM | POA: Diagnosis not present

## 2017-02-10 DIAGNOSIS — I10 Essential (primary) hypertension: Secondary | ICD-10-CM | POA: Diagnosis not present

## 2017-02-10 DIAGNOSIS — I69354 Hemiplegia and hemiparesis following cerebral infarction affecting left non-dominant side: Secondary | ICD-10-CM | POA: Diagnosis not present

## 2017-02-10 DIAGNOSIS — I739 Peripheral vascular disease, unspecified: Secondary | ICD-10-CM | POA: Diagnosis not present

## 2017-02-10 DIAGNOSIS — M1712 Unilateral primary osteoarthritis, left knee: Secondary | ICD-10-CM | POA: Diagnosis not present

## 2017-02-11 DIAGNOSIS — I69354 Hemiplegia and hemiparesis following cerebral infarction affecting left non-dominant side: Secondary | ICD-10-CM | POA: Diagnosis not present

## 2017-02-11 DIAGNOSIS — I10 Essential (primary) hypertension: Secondary | ICD-10-CM | POA: Diagnosis not present

## 2017-02-11 DIAGNOSIS — Z7982 Long term (current) use of aspirin: Secondary | ICD-10-CM | POA: Diagnosis not present

## 2017-02-11 DIAGNOSIS — M1712 Unilateral primary osteoarthritis, left knee: Secondary | ICD-10-CM | POA: Diagnosis not present

## 2017-02-11 DIAGNOSIS — G40909 Epilepsy, unspecified, not intractable, without status epilepticus: Secondary | ICD-10-CM | POA: Diagnosis not present

## 2017-02-11 DIAGNOSIS — I739 Peripheral vascular disease, unspecified: Secondary | ICD-10-CM | POA: Diagnosis not present

## 2017-02-11 DIAGNOSIS — Z7901 Long term (current) use of anticoagulants: Secondary | ICD-10-CM | POA: Diagnosis not present

## 2017-02-11 DIAGNOSIS — I480 Paroxysmal atrial fibrillation: Secondary | ICD-10-CM | POA: Diagnosis not present

## 2017-02-14 DIAGNOSIS — G40909 Epilepsy, unspecified, not intractable, without status epilepticus: Secondary | ICD-10-CM | POA: Diagnosis not present

## 2017-02-14 DIAGNOSIS — Z7982 Long term (current) use of aspirin: Secondary | ICD-10-CM | POA: Diagnosis not present

## 2017-02-14 DIAGNOSIS — M1712 Unilateral primary osteoarthritis, left knee: Secondary | ICD-10-CM | POA: Diagnosis not present

## 2017-02-14 DIAGNOSIS — I480 Paroxysmal atrial fibrillation: Secondary | ICD-10-CM | POA: Diagnosis not present

## 2017-02-14 DIAGNOSIS — Z7901 Long term (current) use of anticoagulants: Secondary | ICD-10-CM | POA: Diagnosis not present

## 2017-02-14 DIAGNOSIS — I10 Essential (primary) hypertension: Secondary | ICD-10-CM | POA: Diagnosis not present

## 2017-02-14 DIAGNOSIS — I69354 Hemiplegia and hemiparesis following cerebral infarction affecting left non-dominant side: Secondary | ICD-10-CM | POA: Diagnosis not present

## 2017-02-14 DIAGNOSIS — I739 Peripheral vascular disease, unspecified: Secondary | ICD-10-CM | POA: Diagnosis not present

## 2017-02-16 DIAGNOSIS — I480 Paroxysmal atrial fibrillation: Secondary | ICD-10-CM | POA: Diagnosis not present

## 2017-02-16 DIAGNOSIS — I739 Peripheral vascular disease, unspecified: Secondary | ICD-10-CM | POA: Diagnosis not present

## 2017-02-16 DIAGNOSIS — I69354 Hemiplegia and hemiparesis following cerebral infarction affecting left non-dominant side: Secondary | ICD-10-CM | POA: Diagnosis not present

## 2017-02-16 DIAGNOSIS — M1712 Unilateral primary osteoarthritis, left knee: Secondary | ICD-10-CM | POA: Diagnosis not present

## 2017-02-16 DIAGNOSIS — G40909 Epilepsy, unspecified, not intractable, without status epilepticus: Secondary | ICD-10-CM | POA: Diagnosis not present

## 2017-02-16 DIAGNOSIS — Z7901 Long term (current) use of anticoagulants: Secondary | ICD-10-CM | POA: Diagnosis not present

## 2017-02-16 DIAGNOSIS — I10 Essential (primary) hypertension: Secondary | ICD-10-CM | POA: Diagnosis not present

## 2017-02-16 DIAGNOSIS — Z7982 Long term (current) use of aspirin: Secondary | ICD-10-CM | POA: Diagnosis not present

## 2017-02-17 DIAGNOSIS — I69354 Hemiplegia and hemiparesis following cerebral infarction affecting left non-dominant side: Secondary | ICD-10-CM | POA: Diagnosis not present

## 2017-02-17 DIAGNOSIS — I10 Essential (primary) hypertension: Secondary | ICD-10-CM | POA: Diagnosis not present

## 2017-02-17 DIAGNOSIS — Z7901 Long term (current) use of anticoagulants: Secondary | ICD-10-CM | POA: Diagnosis not present

## 2017-02-17 DIAGNOSIS — M1712 Unilateral primary osteoarthritis, left knee: Secondary | ICD-10-CM | POA: Diagnosis not present

## 2017-02-17 DIAGNOSIS — Z7982 Long term (current) use of aspirin: Secondary | ICD-10-CM | POA: Diagnosis not present

## 2017-02-17 DIAGNOSIS — I739 Peripheral vascular disease, unspecified: Secondary | ICD-10-CM | POA: Diagnosis not present

## 2017-02-17 DIAGNOSIS — I480 Paroxysmal atrial fibrillation: Secondary | ICD-10-CM | POA: Diagnosis not present

## 2017-02-17 DIAGNOSIS — G40909 Epilepsy, unspecified, not intractable, without status epilepticus: Secondary | ICD-10-CM | POA: Diagnosis not present

## 2017-02-18 DIAGNOSIS — I480 Paroxysmal atrial fibrillation: Secondary | ICD-10-CM | POA: Diagnosis not present

## 2017-02-18 DIAGNOSIS — M1712 Unilateral primary osteoarthritis, left knee: Secondary | ICD-10-CM | POA: Diagnosis not present

## 2017-02-18 DIAGNOSIS — I739 Peripheral vascular disease, unspecified: Secondary | ICD-10-CM | POA: Diagnosis not present

## 2017-02-18 DIAGNOSIS — Z7982 Long term (current) use of aspirin: Secondary | ICD-10-CM | POA: Diagnosis not present

## 2017-02-18 DIAGNOSIS — G40909 Epilepsy, unspecified, not intractable, without status epilepticus: Secondary | ICD-10-CM | POA: Diagnosis not present

## 2017-02-18 DIAGNOSIS — I69354 Hemiplegia and hemiparesis following cerebral infarction affecting left non-dominant side: Secondary | ICD-10-CM | POA: Diagnosis not present

## 2017-02-18 DIAGNOSIS — Z7901 Long term (current) use of anticoagulants: Secondary | ICD-10-CM | POA: Diagnosis not present

## 2017-02-18 DIAGNOSIS — I10 Essential (primary) hypertension: Secondary | ICD-10-CM | POA: Diagnosis not present

## 2017-02-19 DIAGNOSIS — M1712 Unilateral primary osteoarthritis, left knee: Secondary | ICD-10-CM | POA: Diagnosis not present

## 2017-02-19 DIAGNOSIS — I480 Paroxysmal atrial fibrillation: Secondary | ICD-10-CM | POA: Diagnosis not present

## 2017-02-19 DIAGNOSIS — Z7982 Long term (current) use of aspirin: Secondary | ICD-10-CM | POA: Diagnosis not present

## 2017-02-19 DIAGNOSIS — I10 Essential (primary) hypertension: Secondary | ICD-10-CM | POA: Diagnosis not present

## 2017-02-19 DIAGNOSIS — G40909 Epilepsy, unspecified, not intractable, without status epilepticus: Secondary | ICD-10-CM | POA: Diagnosis not present

## 2017-02-19 DIAGNOSIS — Z7901 Long term (current) use of anticoagulants: Secondary | ICD-10-CM | POA: Diagnosis not present

## 2017-02-19 DIAGNOSIS — I69354 Hemiplegia and hemiparesis following cerebral infarction affecting left non-dominant side: Secondary | ICD-10-CM | POA: Diagnosis not present

## 2017-02-19 DIAGNOSIS — I739 Peripheral vascular disease, unspecified: Secondary | ICD-10-CM | POA: Diagnosis not present

## 2017-02-21 DIAGNOSIS — I10 Essential (primary) hypertension: Secondary | ICD-10-CM | POA: Diagnosis not present

## 2017-02-21 DIAGNOSIS — I480 Paroxysmal atrial fibrillation: Secondary | ICD-10-CM | POA: Diagnosis not present

## 2017-02-21 DIAGNOSIS — I69354 Hemiplegia and hemiparesis following cerebral infarction affecting left non-dominant side: Secondary | ICD-10-CM | POA: Diagnosis not present

## 2017-02-21 DIAGNOSIS — Z7901 Long term (current) use of anticoagulants: Secondary | ICD-10-CM | POA: Diagnosis not present

## 2017-02-21 DIAGNOSIS — G40909 Epilepsy, unspecified, not intractable, without status epilepticus: Secondary | ICD-10-CM | POA: Diagnosis not present

## 2017-02-21 DIAGNOSIS — Z7982 Long term (current) use of aspirin: Secondary | ICD-10-CM | POA: Diagnosis not present

## 2017-02-21 DIAGNOSIS — I739 Peripheral vascular disease, unspecified: Secondary | ICD-10-CM | POA: Diagnosis not present

## 2017-02-21 DIAGNOSIS — M1712 Unilateral primary osteoarthritis, left knee: Secondary | ICD-10-CM | POA: Diagnosis not present

## 2017-02-24 DIAGNOSIS — Z7982 Long term (current) use of aspirin: Secondary | ICD-10-CM | POA: Diagnosis not present

## 2017-02-24 DIAGNOSIS — I10 Essential (primary) hypertension: Secondary | ICD-10-CM | POA: Diagnosis not present

## 2017-02-24 DIAGNOSIS — G40909 Epilepsy, unspecified, not intractable, without status epilepticus: Secondary | ICD-10-CM | POA: Diagnosis not present

## 2017-02-24 DIAGNOSIS — I739 Peripheral vascular disease, unspecified: Secondary | ICD-10-CM | POA: Diagnosis not present

## 2017-02-24 DIAGNOSIS — I69354 Hemiplegia and hemiparesis following cerebral infarction affecting left non-dominant side: Secondary | ICD-10-CM | POA: Diagnosis not present

## 2017-02-24 DIAGNOSIS — Z7901 Long term (current) use of anticoagulants: Secondary | ICD-10-CM | POA: Diagnosis not present

## 2017-02-24 DIAGNOSIS — I480 Paroxysmal atrial fibrillation: Secondary | ICD-10-CM | POA: Diagnosis not present

## 2017-02-24 DIAGNOSIS — M1712 Unilateral primary osteoarthritis, left knee: Secondary | ICD-10-CM | POA: Diagnosis not present

## 2017-02-28 DIAGNOSIS — G4700 Insomnia, unspecified: Secondary | ICD-10-CM | POA: Diagnosis not present

## 2017-02-28 DIAGNOSIS — F329 Major depressive disorder, single episode, unspecified: Secondary | ICD-10-CM | POA: Diagnosis not present

## 2017-02-28 DIAGNOSIS — R69 Illness, unspecified: Secondary | ICD-10-CM | POA: Diagnosis not present

## 2017-03-02 DIAGNOSIS — Z7982 Long term (current) use of aspirin: Secondary | ICD-10-CM | POA: Diagnosis not present

## 2017-03-02 DIAGNOSIS — I69354 Hemiplegia and hemiparesis following cerebral infarction affecting left non-dominant side: Secondary | ICD-10-CM | POA: Diagnosis not present

## 2017-03-02 DIAGNOSIS — M1712 Unilateral primary osteoarthritis, left knee: Secondary | ICD-10-CM | POA: Diagnosis not present

## 2017-03-02 DIAGNOSIS — I10 Essential (primary) hypertension: Secondary | ICD-10-CM | POA: Diagnosis not present

## 2017-03-02 DIAGNOSIS — I739 Peripheral vascular disease, unspecified: Secondary | ICD-10-CM | POA: Diagnosis not present

## 2017-03-02 DIAGNOSIS — G40909 Epilepsy, unspecified, not intractable, without status epilepticus: Secondary | ICD-10-CM | POA: Diagnosis not present

## 2017-03-02 DIAGNOSIS — Z7901 Long term (current) use of anticoagulants: Secondary | ICD-10-CM | POA: Diagnosis not present

## 2017-03-02 DIAGNOSIS — I480 Paroxysmal atrial fibrillation: Secondary | ICD-10-CM | POA: Diagnosis not present

## 2017-03-04 DIAGNOSIS — G40909 Epilepsy, unspecified, not intractable, without status epilepticus: Secondary | ICD-10-CM | POA: Diagnosis not present

## 2017-03-04 DIAGNOSIS — I480 Paroxysmal atrial fibrillation: Secondary | ICD-10-CM | POA: Diagnosis not present

## 2017-03-04 DIAGNOSIS — Z7901 Long term (current) use of anticoagulants: Secondary | ICD-10-CM | POA: Diagnosis not present

## 2017-03-04 DIAGNOSIS — Z7982 Long term (current) use of aspirin: Secondary | ICD-10-CM | POA: Diagnosis not present

## 2017-03-04 DIAGNOSIS — I69354 Hemiplegia and hemiparesis following cerebral infarction affecting left non-dominant side: Secondary | ICD-10-CM | POA: Diagnosis not present

## 2017-03-04 DIAGNOSIS — I739 Peripheral vascular disease, unspecified: Secondary | ICD-10-CM | POA: Diagnosis not present

## 2017-03-04 DIAGNOSIS — I10 Essential (primary) hypertension: Secondary | ICD-10-CM | POA: Diagnosis not present

## 2017-03-04 DIAGNOSIS — M1712 Unilateral primary osteoarthritis, left knee: Secondary | ICD-10-CM | POA: Diagnosis not present

## 2017-03-10 DIAGNOSIS — I10 Essential (primary) hypertension: Secondary | ICD-10-CM | POA: Diagnosis not present

## 2017-03-10 DIAGNOSIS — I69354 Hemiplegia and hemiparesis following cerebral infarction affecting left non-dominant side: Secondary | ICD-10-CM | POA: Diagnosis not present

## 2017-03-10 DIAGNOSIS — M1712 Unilateral primary osteoarthritis, left knee: Secondary | ICD-10-CM | POA: Diagnosis not present

## 2017-03-10 DIAGNOSIS — I739 Peripheral vascular disease, unspecified: Secondary | ICD-10-CM | POA: Diagnosis not present

## 2017-03-10 DIAGNOSIS — Z7901 Long term (current) use of anticoagulants: Secondary | ICD-10-CM | POA: Diagnosis not present

## 2017-03-10 DIAGNOSIS — G40909 Epilepsy, unspecified, not intractable, without status epilepticus: Secondary | ICD-10-CM | POA: Diagnosis not present

## 2017-03-10 DIAGNOSIS — Z7982 Long term (current) use of aspirin: Secondary | ICD-10-CM | POA: Diagnosis not present

## 2017-03-10 DIAGNOSIS — I480 Paroxysmal atrial fibrillation: Secondary | ICD-10-CM | POA: Diagnosis not present

## 2017-03-11 DIAGNOSIS — I739 Peripheral vascular disease, unspecified: Secondary | ICD-10-CM | POA: Diagnosis not present

## 2017-03-11 DIAGNOSIS — I10 Essential (primary) hypertension: Secondary | ICD-10-CM | POA: Diagnosis not present

## 2017-03-11 DIAGNOSIS — I480 Paroxysmal atrial fibrillation: Secondary | ICD-10-CM | POA: Diagnosis not present

## 2017-03-11 DIAGNOSIS — M1712 Unilateral primary osteoarthritis, left knee: Secondary | ICD-10-CM | POA: Diagnosis not present

## 2017-03-11 DIAGNOSIS — Z7982 Long term (current) use of aspirin: Secondary | ICD-10-CM | POA: Diagnosis not present

## 2017-03-11 DIAGNOSIS — I69354 Hemiplegia and hemiparesis following cerebral infarction affecting left non-dominant side: Secondary | ICD-10-CM | POA: Diagnosis not present

## 2017-03-11 DIAGNOSIS — G40909 Epilepsy, unspecified, not intractable, without status epilepticus: Secondary | ICD-10-CM | POA: Diagnosis not present

## 2017-03-11 DIAGNOSIS — Z7901 Long term (current) use of anticoagulants: Secondary | ICD-10-CM | POA: Diagnosis not present

## 2017-03-22 DIAGNOSIS — Z7901 Long term (current) use of anticoagulants: Secondary | ICD-10-CM | POA: Diagnosis not present

## 2017-03-22 DIAGNOSIS — L89319 Pressure ulcer of right buttock, unspecified stage: Secondary | ICD-10-CM | POA: Diagnosis not present

## 2017-03-22 DIAGNOSIS — R69 Illness, unspecified: Secondary | ICD-10-CM | POA: Diagnosis not present

## 2017-03-22 DIAGNOSIS — I10 Essential (primary) hypertension: Secondary | ICD-10-CM | POA: Diagnosis not present

## 2017-03-22 DIAGNOSIS — Z993 Dependence on wheelchair: Secondary | ICD-10-CM | POA: Diagnosis not present

## 2017-03-22 DIAGNOSIS — I4891 Unspecified atrial fibrillation: Secondary | ICD-10-CM | POA: Diagnosis not present

## 2017-03-22 DIAGNOSIS — E7849 Other hyperlipidemia: Secondary | ICD-10-CM | POA: Diagnosis not present

## 2017-03-22 DIAGNOSIS — G4089 Other seizures: Secondary | ICD-10-CM | POA: Diagnosis not present

## 2017-04-25 DIAGNOSIS — F329 Major depressive disorder, single episode, unspecified: Secondary | ICD-10-CM | POA: Diagnosis not present

## 2017-04-25 DIAGNOSIS — R69 Illness, unspecified: Secondary | ICD-10-CM | POA: Diagnosis not present

## 2017-04-25 DIAGNOSIS — G4700 Insomnia, unspecified: Secondary | ICD-10-CM | POA: Diagnosis not present

## 2017-05-19 ENCOUNTER — Encounter: Payer: Self-pay | Admitting: Podiatry

## 2017-05-19 ENCOUNTER — Ambulatory Visit (INDEPENDENT_AMBULATORY_CARE_PROVIDER_SITE_OTHER): Payer: Medicare HMO | Admitting: Podiatry

## 2017-05-19 DIAGNOSIS — M79674 Pain in right toe(s): Secondary | ICD-10-CM | POA: Diagnosis not present

## 2017-05-19 DIAGNOSIS — B351 Tinea unguium: Secondary | ICD-10-CM

## 2017-05-19 DIAGNOSIS — M79675 Pain in left toe(s): Secondary | ICD-10-CM | POA: Diagnosis not present

## 2017-05-19 NOTE — Progress Notes (Signed)
Subjective:   Patient ID: Virginia Meyer, female   DOB: 84 y.o.   MRN: [id number]   HPI Patient presents with elongated nails 1-5 both feet that are very thickened and dystrophic patient is currently in a wheelchair   ROS      Objective:  Physical Exam  Thick yellow brittle nailbeds 1-5 both feet that are painful and impossible for her to cut with patient in wheelchair     Assessment:  Mycotic nail infection with pain 1-5 both feet     Plan:  Debride painful nailbeds 1-5 both feet with no iatrogenic bleeding noted

## 2017-05-23 DIAGNOSIS — F4322 Adjustment disorder with anxiety: Secondary | ICD-10-CM | POA: Diagnosis not present

## 2017-05-23 DIAGNOSIS — R69 Illness, unspecified: Secondary | ICD-10-CM | POA: Diagnosis not present

## 2017-05-23 DIAGNOSIS — F5102 Adjustment insomnia: Secondary | ICD-10-CM | POA: Diagnosis not present

## 2017-07-21 DIAGNOSIS — F5102 Adjustment insomnia: Secondary | ICD-10-CM | POA: Diagnosis not present

## 2017-07-21 DIAGNOSIS — R69 Illness, unspecified: Secondary | ICD-10-CM | POA: Diagnosis not present

## 2017-07-21 DIAGNOSIS — F4322 Adjustment disorder with anxiety: Secondary | ICD-10-CM | POA: Diagnosis not present

## 2017-08-22 DIAGNOSIS — R69 Illness, unspecified: Secondary | ICD-10-CM | POA: Diagnosis not present

## 2017-08-22 DIAGNOSIS — F4322 Adjustment disorder with anxiety: Secondary | ICD-10-CM | POA: Diagnosis not present

## 2017-08-22 DIAGNOSIS — F5102 Adjustment insomnia: Secondary | ICD-10-CM | POA: Diagnosis not present

## 2017-08-23 ENCOUNTER — Ambulatory Visit: Payer: Medicare HMO | Admitting: Sports Medicine

## 2017-08-31 ENCOUNTER — Encounter (HOSPITAL_COMMUNITY): Payer: Self-pay | Admitting: Emergency Medicine

## 2017-08-31 ENCOUNTER — Emergency Department (HOSPITAL_COMMUNITY): Payer: Medicare HMO

## 2017-08-31 ENCOUNTER — Emergency Department (HOSPITAL_COMMUNITY)
Admission: EM | Admit: 2017-08-31 | Discharge: 2017-08-31 | Disposition: A | Discharge status: Home / Self Care | Payer: Medicare HMO | Attending: Emergency Medicine | Admitting: Emergency Medicine

## 2017-08-31 DIAGNOSIS — Z7982 Long term (current) use of aspirin: Secondary | ICD-10-CM | POA: Diagnosis not present

## 2017-08-31 DIAGNOSIS — I110 Hypertensive heart disease with heart failure: Secondary | ICD-10-CM | POA: Insufficient documentation

## 2017-08-31 DIAGNOSIS — R4020 Unspecified coma: Secondary | ICD-10-CM | POA: Diagnosis not present

## 2017-08-31 DIAGNOSIS — I4892 Unspecified atrial flutter: Secondary | ICD-10-CM | POA: Insufficient documentation

## 2017-08-31 DIAGNOSIS — R55 Syncope and collapse: Secondary | ICD-10-CM | POA: Diagnosis not present

## 2017-08-31 DIAGNOSIS — Z79899 Other long term (current) drug therapy: Secondary | ICD-10-CM | POA: Diagnosis not present

## 2017-08-31 DIAGNOSIS — R1010 Upper abdominal pain, unspecified: Secondary | ICD-10-CM | POA: Diagnosis not present

## 2017-08-31 DIAGNOSIS — I509 Heart failure, unspecified: Secondary | ICD-10-CM | POA: Insufficient documentation

## 2017-08-31 DIAGNOSIS — Z7901 Long term (current) use of anticoagulants: Secondary | ICD-10-CM | POA: Diagnosis not present

## 2017-08-31 DIAGNOSIS — G459 Transient cerebral ischemic attack, unspecified: Secondary | ICD-10-CM | POA: Diagnosis not present

## 2017-08-31 DIAGNOSIS — M2550 Pain in unspecified joint: Secondary | ICD-10-CM | POA: Diagnosis not present

## 2017-08-31 DIAGNOSIS — Z7401 Bed confinement status: Secondary | ICD-10-CM | POA: Diagnosis not present

## 2017-08-31 DIAGNOSIS — R918 Other nonspecific abnormal finding of lung field: Secondary | ICD-10-CM | POA: Diagnosis not present

## 2017-08-31 LAB — CBC WITH DIFFERENTIAL/PLATELET
Abs Immature Granulocytes: 0 10*3/uL (ref 0.0–0.1)
BASOS PCT: 0 %
Basophils Absolute: 0 10*3/uL (ref 0.0–0.1)
EOS ABS: 0.1 10*3/uL (ref 0.0–0.7)
EOS PCT: 1 %
HEMATOCRIT: 40.9 % (ref 36.0–46.0)
Hemoglobin: 13.1 g/dL (ref 12.0–15.0)
IMMATURE GRANULOCYTES: 0 %
LYMPHS ABS: 0.6 10*3/uL — AB (ref 0.7–4.0)
Lymphocytes Relative: 6 %
MCH: 29.1 pg (ref 26.0–34.0)
MCHC: 32 g/dL (ref 30.0–36.0)
MCV: 90.9 fL (ref 78.0–100.0)
Monocytes Absolute: 0.8 10*3/uL (ref 0.1–1.0)
Monocytes Relative: 9 %
NEUTROS PCT: 84 %
Neutro Abs: 8 10*3/uL — ABNORMAL HIGH (ref 1.7–7.7)
PLATELETS: 166 10*3/uL (ref 150–400)
RBC: 4.5 MIL/uL (ref 3.87–5.11)
RDW: 13.6 % (ref 11.5–15.5)
WBC: 9.5 10*3/uL (ref 4.0–10.5)

## 2017-08-31 LAB — COMPREHENSIVE METABOLIC PANEL
ALBUMIN: 3.6 g/dL (ref 3.5–5.0)
ALT: 21 U/L (ref 0–44)
ANION GAP: 7 (ref 5–15)
AST: 27 U/L (ref 15–41)
Alkaline Phosphatase: 118 U/L (ref 38–126)
BILIRUBIN TOTAL: 0.4 mg/dL (ref 0.3–1.2)
BUN: 12 mg/dL (ref 8–23)
CO2: 30 mmol/L (ref 22–32)
Calcium: 9.5 mg/dL (ref 8.9–10.3)
Chloride: 104 mmol/L (ref 98–111)
Creatinine, Ser: 0.88 mg/dL (ref 0.44–1.00)
GFR calc Af Amer: >60 mL/min (ref >60)
GFR, EST NON AFRICAN AMERICAN: 58 mL/min — AB (ref >60)
Glucose, Bld: 93 mg/dL (ref 70–99)
POTASSIUM: 3.5 mmol/L (ref 3.5–5.1)
Sodium: 141 mmol/L (ref 135–145)
TOTAL PROTEIN: 6.3 g/dL — AB (ref 6.5–8.1)

## 2017-08-31 LAB — I-STAT TROPONIN, ED: TROPONIN I, POC: 0 ng/mL (ref 0.00–0.08)

## 2017-08-31 LAB — PROTIME-INR
INR: 1.1
PROTHROMBIN TIME: 14.1 s (ref 11.4–15.2)

## 2017-08-31 NOTE — ED Provider Notes (Addendum)
[facility]ERG[facility] DEPARTMENT Provider Note   CSN: [id number] Arrival date & time: 08/31/17  1204     History   Chief Complaint Chief Complaint  Patient presents with  . Loss of Consciousness  . Abdominal Pain    HPI Virginia Meyer is a 84 y.o. female.  84 yo F with a chief complaint of syncope.  This occurred while the patient was on the commode having a bowel movement.  Currently she is back to her baseline and does not remember the event.  She denies any injury.  Initially when evaluated by EMS there is some concern of abdominal pain.  She denies abdominal pain denies vomiting denies diarrhea.  She denies chest pain or shortness of breath.  She denies headache.  The history is provided by the patient.  Loss of Consciousness   This is a new problem. The current episode started 1 to 2 hours ago. The problem occurs rarely. The problem has been resolved. She lost consciousness for a period of 1 to 5 minutes. The problem is associated with bowel movements. Pertinent negatives include chest pain, congestion, dizziness, fever, headaches, nausea, palpitations and vomiting. She has tried nothing for the symptoms. The treatment provided no relief.    Past Medical History:  Diagnosis Date  . Anxiety   . Fall   . GERD (gastroesophageal reflux disease)   . Heart failure ([place])   . HLD (hyperlipidemia)   . HTN (hypertension)   . Hyperlipidemia   . Hypotension   . Major depressive disorder   . Neuropathy   . PVD (peripheral vascular disease) ([place])   . Stroke [facility])    left sided deficit  . UTI (urinary tract infection)     Patient Active Problem List   Diagnosis Date Noted  . Seizures ([place]) 01/07/2017  . Seizure ([place]) 01/06/2017  . Transient alteration of awareness   . Labile blood pressure   . Hypertensive urgency 10/27/2016  . Decreased responsiveness   . Bradycardia 10/19/2016  . Hypotension 10/19/2016  . Skull lesion 10/19/2016  . History of  cerebrovascular accident (CVA) with residual deficit 10/19/2016  . Personal history of atrial flutter 10/19/2016  . Thoracic aortic atherosclerosis ([place]) 10/19/2016  . Intracranial atherosclerosis 10/19/2016  . Frailty syndrome in geriatric patient 10/19/2016  . Acute kidney injury ([place]) 10/19/2016  . 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 Syncope, Recurrent 10/18/2016  . Carotid artery disease ([place]) 07/06/2016  . Gait abnormality 05/13/2016  . Cerebrovascular accident (CVA) due to thrombosis of right anterior cerebral artery ([place]) 05/13/2016  . History of heart block 05/07/2016  . Benign labile hypertension 08/21/2015  . Acute CVA (cerebrovascular accident) ([place]) 08/04/2015  . Left hemiparesis ([place]) 08/04/2015  . Primary osteoarthritis of left knee 08/04/2015  . Esophageal reflux 08/04/2015  . Hyperlipidemia LDL goal <70 08/04/2015  . Paroxysmal atrial fibrillation ([place]) 08/04/2015  . PVD (peripheral vascular disease) ([place]) 08/04/2015  . Protein-calorie malnutrition ([place]) 08/04/2015    Past Surgical History:  Procedure Laterality Date  . ABDOMINAL HYSTERECTOMY    . FOOT NEUROMA SURGERY    . SPINE SURGERY    . TONSILLECTOMY       OB History   None      Home Medications    Prior to Admission medications   Medication Sig Start Date End Date Taking? Authorizing Provider  amiodarone (PACERONE) 100 MG tablet Take 1 tablet (100 mg total) daily by mouth. 01/09/17   Velna Ochs, MD  apixaban (ELIQUIS) 2.5 MG TABS tablet Take 1 tablet (2.5 mg total) by mouth 2 (two) times daily. 10/16/15   Rosalin Hawking, MD  aspirin EC 81 MG EC tablet Take 1 tablet (81 mg total) daily by mouth. 01/09/17   Velna Ochs, MD  atorvastatin (LIPITOR) 40 MG tablet Take 1 tablet (40 mg total) by mouth daily at 6 PM. 10/21/16   Everrett Coombe, MD  capsaicin (ZOSTRIX) 0.025 % cream Apply 1 application at bedtime topically. APPLY BILATERAL TO FEET AT BEDTIME FOR PAIN     [provider]  gabapentin (NEURONTIN) 100 MG capsule Take 1 capsule (100 mg total) by mouth 2 (two) times daily. 100 mg in the morning and 200 mg at bedtime 10/29/16   Nuala Alpha, DO  hydrochlorothiazide (MICROZIDE) 12.5 MG capsule Take 1 capsule (12.5 mg total) daily by mouth. 01/09/17   Velna Ochs, MD  levETIRAcetam (KEPPRA) 500 MG tablet Take 1 tablet (500 mg total) 2 (two) times daily by mouth. 01/08/17   Velna Ochs, MD  losartan (COZAAR) 100 MG tablet Take 1 tablet (100 mg total) daily by mouth. 01/09/17   Velna Ochs, MD  meclizine (ANTIVERT) 12.5 MG tablet Take 12.5 mg by mouth every 8 (eight) hours as needed for dizziness. Not to exceed 3 doses/24 hours    [provider]  Menthol, Topical Analgesic, (BIOFREEZE) 4 % GEL Apply 1 application topically See admin instructions. FOUR TIMES A DAY FOR BILATERAL KNEE PAIN    [provider]  polyethylene glycol (MIRALAX / GLYCOLAX) packet Take 17 g by mouth daily as needed for mild constipation. To be mixed with 6 ounces of fluid    [provider]  sertraline (ZOLOFT) 25 MG tablet Take 25 mg by mouth daily.    [provider]  traMADol (ULTRAM) 50 MG tablet Take 25 mg by mouth every 8 (eight) hours as needed (for pain).     [provider]    Family History Family History  Problem Relation Age of Onset  . Heart attack Brother   . Stroke Neg Hx   . Neuropathy Neg Hx     Social History Social History   Tobacco Use  . Smoking status: Never Smoker  . Smokeless tobacco: Never Used  Substance Use Topics  . Alcohol use: No    Alcohol/week: 0.0 oz  . Drug use: No     Allergies   Patient has no known allergies.   Review of Systems Review of Systems  Constitutional: Negative for chills and fever.  HENT: Negative for congestion and rhinorrhea.   Eyes: Negative for redness and visual disturbance.  Respiratory: Negative for shortness of breath and wheezing.     Cardiovascular: Positive for syncope. Negative for chest pain and palpitations.  Gastrointestinal: Negative for nausea and vomiting.  Genitourinary: Negative for dysuria and urgency.  Musculoskeletal: Negative for arthralgias and myalgias.  Skin: Negative for pallor and wound.  Neurological: Negative for dizziness and headaches.     Physical Exam Updated Vital Signs BP (!) 188/75 (BP Location: Right Arm)   Pulse 68   Temp 97.9 F (36.6 C) (Oral)   Resp 14   SpO2 98%   Physical Exam  Constitutional: She is oriented to person, place, and time. She appears well-developed and well-nourished. No distress.  Chronically ill-appearing  HENT:  Head: Normocephalic and atraumatic.  Eyes: Pupils are equal, round, and reactive to light. EOM are normal.  Neck: Normal range of motion. Neck supple.  Cardiovascular: Normal rate  and regular rhythm. Exam reveals no gallop and no friction rub.  No murmur heard. Pulmonary/Chest: Effort normal. She has no wheezes. She has no rales.  Abdominal: Soft. She exhibits no distension. There is no tenderness.  Musculoskeletal: She exhibits no edema or tenderness.  Neurological: She is alert and oriented to person, place, and time.  Skin: Skin is warm and dry. She is not diaphoretic.  Psychiatric: She has a normal mood and affect. Her behavior is normal.  Nursing note and vitals reviewed.    ED Treatments / Results  Labs (all labs ordered are listed, but only abnormal results are displayed) Labs Reviewed  CBC WITH DIFFERENTIAL/PLATELET - Abnormal; Notable for the following components:      Result Value   Neutro Abs 8.0 (*)    Lymphs Abs 0.6 (*)    All other components within normal limits  COMPREHENSIVE METABOLIC PANEL - Abnormal; Notable for the following components:   Total Protein 6.3 (*)    GFR calc non Af Amer 58 (*)    All other components within normal limits  PROTIME-INR  I-STAT TROPONIN, ED    EKG EKG  Interpretation  Date/Time:  Wednesday August 31 2017 12:17:22 EDT Ventricular Rate:  66 PR Interval:    QRS Duration: 110 QT Interval:  479 QTC Calculation: 502 R Axis:   -12 Text Interpretation:  Sinus rhythm Short PR interval Left atrial enlargement LVH with secondary repolarization abnormality Anterior Q waves, possibly due to LVH Prolonged QT interval frequent pvc Otherwise no significant change Confirmed by Deno Etienne [phone]) on 08/31/2017 1:47:59 PM   EKG Interpretation  Date/Time:  Wednesday August 31 2017 14:04:22 EDT Ventricular Rate:  67 PR Interval:    QRS Duration: 106 QT Interval:  461 QTC Calculation: 487 R Axis:   -33 Text Interpretation:  Sinus rhythm Left ventricular hypertrophy Anterior infarct, old No significant change since last tracing Confirmed by Deno Etienne [id number]) on 08/31/2017 2:29:54 PM        Radiology Dg Chest 2 View  Result Date: 08/31/2017 CLINICAL DATA:  Syncope EXAM: CHEST - 2 VIEW COMPARISON:  October 27, 2016 and January 07, 2016 chest radiograph; neck CT including upper chest October 18, 2016 FINDINGS: There is increased opacity in the medial right apex compared to prior studies. There is mild volume loss in this area. Lungs elsewhere are clear. Heart size and pulmonary vascularity are normal. No adenopathy. There is aortic atherosclerosis. There is degenerative change in the thoracic spine and right shoulder with superior migration of the right humeral head. Bones are osteoporotic. IMPRESSION: 1. Increased opacity right upper lobe with volume loss. Advise chest CT, ideally with intravenous contrast, to further evaluate this area. The possibility of a mass arising in the medial right apex cannot be excluded. 2.  Lungs elsewhere clear. 3.  Aortic atherosclerosis. 4. Bones osteoporotic. Degenerative change in thoracic spine. Superior migration of right humeral head suggests chronic rotator cuff tear. Aortic Atherosclerosis (ICD10-I70.0). Electronically Signed    By: Lowella Grip III M.D.   On: 08/31/2017 13:29    Procedures Procedures (including critical care time)  Medications Ordered in ED Medications - No data to display   Initial Impression / Assessment and Plan / ED Course  I have reviewed the triage vital signs and the nursing notes.  Pertinent labs & imaging results that were available during my care of the patient were reviewed by me and considered in my medical decision making (see chart for details).  84 yo F with a chief complaint of syncope.  This occurred while the patient was having a bowel movement.  Most likely this is vasovagal by history.  Patient has no complaints currently.  On exam she has no signs of trauma.  Initially was complaining of abdominal pain has no abdominal pain and deep palpation in all quadrants.  On her chest x-ray she has an increased opacity in the right medial apex, this has been seen on multiple prior x-rays.  She will need a CT scan as an outpatient.  labwork otherwise unremarkable.  D/c home.  2:07 PM:  I have discussed the diagnosis/risks/treatment options with the patient and believe the pt to be eligible for discharge home to follow-up with PCP. We also discussed returning to the ED immediately if new or worsening sx occur. We discussed the sx which are most concerning (e.g., sudden worsening pain, fever, inability to tolerate by mouth) that necessitate immediate return. Medications administered to the patient during their visit and any new prescriptions provided to the patient are listed below.  Medications given during this visit Medications - No data to display    The patient appears reasonably screen and/or stabilized for discharge and I doubt any other medical condition or other [facility] requiring further screening, evaluation, or treatment in the ED at this time prior to discharge.    Final Clinical Impressions(s) / ED Diagnoses   Final diagnoses:  Vasovagal syncope    ED Discharge  Orders    None       Deno Etienne, DO 08/31/17 [place], DO 08/31/17 1430

## 2017-08-31 NOTE — ED Notes (Signed)
Patient transported to X-ray 

## 2017-08-31 NOTE — ED Notes (Signed)
Report given to [facility], nurse, at facility.

## 2017-08-31 NOTE — ED Notes (Signed)
ptar called to transport back to facility

## 2017-08-31 NOTE — Discharge Instructions (Addendum)
Eat and drink well for the next couple of days.  Follow up with your PCP, they may want to do further testing.   You had an opacity on your chest xray that needs further evaluation, the radiologist recommended a CT scan of your chest.  Please discuss with your family doctor.

## 2017-08-31 NOTE — ED Notes (Signed)
Pt resting- no signs of distress. Bilateral Hearing aides removed and placed in labeled specimen cup.

## 2017-08-31 NOTE — ED Triage Notes (Addendum)
Pt from [facility]- syncopized having BM. Stayed on commode and came to. She is complaining of back and abdominal pain. Did have BM. CBG 148

## 2017-09-06 DIAGNOSIS — K5909 Other constipation: Secondary | ICD-10-CM | POA: Diagnosis not present

## 2017-09-15 DIAGNOSIS — M6259 Muscle wasting and atrophy, not elsewhere classified, multiple sites: Secondary | ICD-10-CM | POA: Diagnosis not present

## 2017-09-15 DIAGNOSIS — R262 Difficulty in walking, not elsewhere classified: Secondary | ICD-10-CM | POA: Diagnosis not present

## 2017-09-20 DIAGNOSIS — M6259 Muscle wasting and atrophy, not elsewhere classified, multiple sites: Secondary | ICD-10-CM | POA: Diagnosis not present

## 2017-09-20 DIAGNOSIS — R262 Difficulty in walking, not elsewhere classified: Secondary | ICD-10-CM | POA: Diagnosis not present

## 2017-09-22 DIAGNOSIS — F4322 Adjustment disorder with anxiety: Secondary | ICD-10-CM | POA: Diagnosis not present

## 2017-09-22 DIAGNOSIS — R69 Illness, unspecified: Secondary | ICD-10-CM | POA: Diagnosis not present

## 2017-09-22 DIAGNOSIS — F5102 Adjustment insomnia: Secondary | ICD-10-CM | POA: Diagnosis not present

## 2017-09-27 DIAGNOSIS — I69852 Hemiplegia and hemiparesis following other cerebrovascular disease affecting left dominant side: Secondary | ICD-10-CM | POA: Diagnosis not present

## 2017-09-27 DIAGNOSIS — R262 Difficulty in walking, not elsewhere classified: Secondary | ICD-10-CM | POA: Diagnosis not present

## 2017-09-27 DIAGNOSIS — R269 Unspecified abnormalities of gait and mobility: Secondary | ICD-10-CM | POA: Diagnosis not present

## 2017-09-27 DIAGNOSIS — I10 Essential (primary) hypertension: Secondary | ICD-10-CM | POA: Diagnosis not present

## 2017-09-27 DIAGNOSIS — M6259 Muscle wasting and atrophy, not elsewhere classified, multiple sites: Secondary | ICD-10-CM | POA: Diagnosis not present

## 2017-09-27 DIAGNOSIS — R21 Rash and other nonspecific skin eruption: Secondary | ICD-10-CM | POA: Diagnosis not present

## 2017-09-27 DIAGNOSIS — Z79899 Other long term (current) drug therapy: Secondary | ICD-10-CM | POA: Diagnosis not present

## 2017-09-29 DIAGNOSIS — F5102 Adjustment insomnia: Secondary | ICD-10-CM | POA: Diagnosis not present

## 2017-09-29 DIAGNOSIS — F4322 Adjustment disorder with anxiety: Secondary | ICD-10-CM | POA: Diagnosis not present

## 2017-09-29 DIAGNOSIS — R69 Illness, unspecified: Secondary | ICD-10-CM | POA: Diagnosis not present

## 2017-09-29 DIAGNOSIS — M6259 Muscle wasting and atrophy, not elsewhere classified, multiple sites: Secondary | ICD-10-CM | POA: Diagnosis not present

## 2017-09-29 DIAGNOSIS — R262 Difficulty in walking, not elsewhere classified: Secondary | ICD-10-CM | POA: Diagnosis not present

## 2017-10-03 DIAGNOSIS — M6259 Muscle wasting and atrophy, not elsewhere classified, multiple sites: Secondary | ICD-10-CM | POA: Diagnosis not present

## 2017-10-03 DIAGNOSIS — R262 Difficulty in walking, not elsewhere classified: Secondary | ICD-10-CM | POA: Diagnosis not present

## 2017-10-10 ENCOUNTER — Encounter (HOSPITAL_COMMUNITY): Payer: Medicare HMO

## 2017-10-11 DIAGNOSIS — R69 Illness, unspecified: Secondary | ICD-10-CM | POA: Diagnosis not present

## 2017-10-11 DIAGNOSIS — K5901 Slow transit constipation: Secondary | ICD-10-CM | POA: Diagnosis not present

## 2017-10-11 DIAGNOSIS — I69852 Hemiplegia and hemiparesis following other cerebrovascular disease affecting left dominant side: Secondary | ICD-10-CM | POA: Diagnosis not present

## 2017-10-11 DIAGNOSIS — Z79899 Other long term (current) drug therapy: Secondary | ICD-10-CM | POA: Diagnosis not present

## 2017-10-11 DIAGNOSIS — I6789 Other cerebrovascular disease: Secondary | ICD-10-CM | POA: Diagnosis not present

## 2017-10-13 ENCOUNTER — Ambulatory Visit (HOSPITAL_COMMUNITY)
Admission: RE | Admit: 2017-10-13 | Discharge: 2017-10-13 | Disposition: A | Discharge status: Home / Self Care | Payer: Medicare HMO | Source: Ambulatory Visit | Attending: Cardiology | Admitting: Cardiology

## 2017-10-13 DIAGNOSIS — I6523 Occlusion and stenosis of bilateral carotid arteries: Secondary | ICD-10-CM | POA: Diagnosis not present

## 2017-10-17 ENCOUNTER — Other Ambulatory Visit: Payer: Self-pay | Admitting: *Deleted

## 2017-10-17 DIAGNOSIS — I6523 Occlusion and stenosis of bilateral carotid arteries: Secondary | ICD-10-CM

## 2017-10-18 DIAGNOSIS — H34811 Central retinal vein occlusion, right eye, with macular edema: Secondary | ICD-10-CM | POA: Diagnosis not present

## 2017-10-19 DIAGNOSIS — H348121 Central retinal vein occlusion, left eye, with retinal neovascularization: Secondary | ICD-10-CM | POA: Diagnosis not present

## 2017-10-19 DIAGNOSIS — M316 Other giant cell arteritis: Secondary | ICD-10-CM | POA: Diagnosis not present

## 2017-10-20 DIAGNOSIS — Z961 Presence of intraocular lens: Secondary | ICD-10-CM | POA: Diagnosis not present

## 2017-10-20 DIAGNOSIS — H34811 Central retinal vein occlusion, right eye, with macular edema: Secondary | ICD-10-CM | POA: Diagnosis not present

## 2017-10-20 DIAGNOSIS — I69852 Hemiplegia and hemiparesis following other cerebrovascular disease affecting left dominant side: Secondary | ICD-10-CM | POA: Diagnosis not present

## 2017-10-20 DIAGNOSIS — H53131 Sudden visual loss, right eye: Secondary | ICD-10-CM | POA: Diagnosis not present

## 2017-10-20 DIAGNOSIS — H4312 Vitreous hemorrhage, left eye: Secondary | ICD-10-CM | POA: Diagnosis not present

## 2017-10-20 DIAGNOSIS — H35351 Cystoid macular degeneration, right eye: Secondary | ICD-10-CM | POA: Diagnosis not present

## 2017-10-22 ENCOUNTER — Emergency Department (HOSPITAL_COMMUNITY): Payer: Medicare HMO

## 2017-10-22 ENCOUNTER — Encounter (HOSPITAL_COMMUNITY): Payer: Self-pay

## 2017-10-22 ENCOUNTER — Emergency Department (HOSPITAL_COMMUNITY)
Admission: EM | Admit: 2017-10-22 | Discharge: 2017-10-22 | Disposition: A | Discharge status: Home / Self Care | Payer: Medicare HMO | Attending: Emergency Medicine | Admitting: Emergency Medicine

## 2017-10-22 DIAGNOSIS — R410 Disorientation, unspecified: Secondary | ICD-10-CM

## 2017-10-22 DIAGNOSIS — Z8673 Personal history of transient ischemic attack (TIA), and cerebral infarction without residual deficits: Secondary | ICD-10-CM | POA: Insufficient documentation

## 2017-10-22 DIAGNOSIS — R0689 Other abnormalities of breathing: Secondary | ICD-10-CM | POA: Diagnosis not present

## 2017-10-22 DIAGNOSIS — Z7901 Long term (current) use of anticoagulants: Secondary | ICD-10-CM | POA: Insufficient documentation

## 2017-10-22 DIAGNOSIS — Z79899 Other long term (current) drug therapy: Secondary | ICD-10-CM | POA: Insufficient documentation

## 2017-10-22 DIAGNOSIS — I2510 Atherosclerotic heart disease of native coronary artery without angina pectoris: Secondary | ICD-10-CM | POA: Diagnosis not present

## 2017-10-22 DIAGNOSIS — I10 Essential (primary) hypertension: Secondary | ICD-10-CM | POA: Diagnosis not present

## 2017-10-22 DIAGNOSIS — R509 Fever, unspecified: Secondary | ICD-10-CM | POA: Diagnosis not present

## 2017-10-22 DIAGNOSIS — I509 Heart failure, unspecified: Secondary | ICD-10-CM | POA: Diagnosis not present

## 2017-10-22 DIAGNOSIS — R0902 Hypoxemia: Secondary | ICD-10-CM | POA: Diagnosis not present

## 2017-10-22 DIAGNOSIS — R4182 Altered mental status, unspecified: Secondary | ICD-10-CM | POA: Diagnosis not present

## 2017-10-22 DIAGNOSIS — I110 Hypertensive heart disease with heart failure: Secondary | ICD-10-CM | POA: Insufficient documentation

## 2017-10-22 DIAGNOSIS — R5383 Other fatigue: Secondary | ICD-10-CM | POA: Diagnosis not present

## 2017-10-22 DIAGNOSIS — I959 Hypotension, unspecified: Secondary | ICD-10-CM | POA: Diagnosis not present

## 2017-10-22 HISTORY — DX: Heart failure, unspecified: I50.9

## 2017-10-22 LAB — URINALYSIS, ROUTINE W REFLEX MICROSCOPIC
Bilirubin Urine: NEGATIVE
GLUCOSE, UA: NEGATIVE mg/dL
KETONES UR: NEGATIVE mg/dL
Nitrite: NEGATIVE
PH: 5 (ref 5.0–8.0)
Protein, ur: 30 mg/dL — AB
Specific Gravity, Urine: 1.016 (ref 1.005–1.030)

## 2017-10-22 LAB — COMPREHENSIVE METABOLIC PANEL
ALK PHOS: 117 U/L (ref 38–126)
ALT: 20 U/L (ref 0–44)
ANION GAP: 13 (ref 5–15)
AST: 38 U/L (ref 15–41)
Albumin: 4 g/dL (ref 3.5–5.0)
BILIRUBIN TOTAL: 0.7 mg/dL (ref 0.3–1.2)
BUN: 14 mg/dL (ref 8–23)
CALCIUM: 9.7 mg/dL (ref 8.9–10.3)
CO2: 25 mmol/L (ref 22–32)
Chloride: 101 mmol/L (ref 98–111)
Creatinine, Ser: 1.02 mg/dL — ABNORMAL HIGH (ref 0.44–1.00)
GFR calc non Af Amer: 48 mL/min — ABNORMAL LOW (ref >60)
GFR, EST AFRICAN AMERICAN: 56 mL/min — AB (ref >60)
Glucose, Bld: 123 mg/dL — ABNORMAL HIGH (ref 70–99)
Potassium: 3.2 mmol/L — ABNORMAL LOW (ref 3.5–5.1)
Sodium: 139 mmol/L (ref 135–145)
TOTAL PROTEIN: 6.9 g/dL (ref 6.5–8.1)

## 2017-10-22 LAB — CBC WITH DIFFERENTIAL/PLATELET
ABS IMMATURE GRANULOCYTES: 0 10*3/uL (ref 0.0–0.1)
BASOS ABS: 0 10*3/uL (ref 0.0–0.1)
BASOS PCT: 0 %
Eosinophils Absolute: 0 10*3/uL (ref 0.0–0.7)
Eosinophils Relative: 0 %
HCT: 46.1 % — ABNORMAL HIGH (ref 36.0–46.0)
Hemoglobin: 14.4 g/dL (ref 12.0–15.0)
IMMATURE GRANULOCYTES: 0 %
Lymphocytes Relative: 12 %
Lymphs Abs: 1.2 10*3/uL (ref 0.7–4.0)
MCH: 28.9 pg (ref 26.0–34.0)
MCHC: 31.2 g/dL (ref 30.0–36.0)
MCV: 92.6 fL (ref 78.0–100.0)
Monocytes Absolute: 0.9 10*3/uL (ref 0.1–1.0)
Monocytes Relative: 9 %
NEUTROS ABS: 7.7 10*3/uL (ref 1.7–7.7)
NEUTROS PCT: 79 %
PLATELETS: 188 10*3/uL (ref 150–400)
RBC: 4.98 MIL/uL (ref 3.87–5.11)
RDW: 14.4 % (ref 11.5–15.5)
WBC: 9.9 10*3/uL (ref 4.0–10.5)

## 2017-10-22 LAB — I-STAT CG4 LACTIC ACID, ED: Lactic Acid, Venous: 2.05 mmol/L (ref 0.5–1.9)

## 2017-10-22 MED ORDER — LOSARTAN POTASSIUM 50 MG PO TABS
100.0000 mg | ORAL_TABLET | Freq: Once | ORAL | Status: AC
  Administered 2017-10-22: 100 mg via ORAL
  Filled 2017-10-22 (×2): qty 2

## 2017-10-22 MED ORDER — HYDROCHLOROTHIAZIDE 12.5 MG PO CAPS
12.5000 mg | ORAL_CAPSULE | Freq: Once | ORAL | Status: AC
  Administered 2017-10-22: 12.5 mg via ORAL
  Filled 2017-10-22: qty 1

## 2017-10-22 MED ORDER — HYDRALAZINE HCL 20 MG/ML IJ SOLN
10.0000 mg | Freq: Once | INTRAMUSCULAR | Status: AC
  Administered 2017-10-22: 10 mg via INTRAVENOUS
  Filled 2017-10-22: qty 1

## 2017-10-22 MED ORDER — ACETAMINOPHEN 500 MG PO TABS
1000.0000 mg | ORAL_TABLET | Freq: Once | ORAL | Status: AC
  Administered 2017-10-22: 1000 mg via ORAL
  Filled 2017-10-22: qty 2

## 2017-10-22 MED ORDER — HYDRALAZINE HCL 20 MG/ML IJ SOLN
5.0000 mg | Freq: Once | INTRAMUSCULAR | Status: AC
  Administered 2017-10-22: 5 mg via INTRAVENOUS
  Filled 2017-10-22: qty 1

## 2017-10-22 NOTE — ED Notes (Signed)
Called ptar for pt transport  

## 2017-10-22 NOTE — ED Triage Notes (Signed)
Pt arrived via [facility]; pt from Urgent Care with c/o AMS; pt resides at [facility]; pt normally A/Ox4 and wheelchair bound; pt has been altered by 2 days; pt confused with disorganized thought. Pt is HOH with bilateral hearing aids. Pt currenlty A/Ox4 according to EMS. Pt is febrile with foul urine; pt is incontinent. 100.2, 172/108; 74; 28 R; 95% on RA; 121 CBG; Pt has 18G RAC and rec'd 1G tylenol (oral). Pt presents with new onset cough.

## 2017-10-22 NOTE — ED Provider Notes (Signed)
Patient was being discharged by [facility] when she was found to have elevated blood pressure.  Blood pressure was over 505 systolic.  Patient says that she has not taken her blood pressure medicine today as she is been in the ED all morning.  Patient will be given her home blood pressure medicine as well as a dose of hydralazine given her heart rate of 60.  If blood pressure is downtrending, patient will be discharged for further outpatient management.  8:56 PM Patient was given her home blood pressure medicines as well as hydralazine.  Her blood pressure improved into the 190s but then worsened again.  It is currently in the 397Q to 734 systolic range.    Patient will give another dose of hydralazine.  If blood pressures do not improve, patient will be admitted for and controlled hypertension contributing to altered mental status.  9:31 PM Patient's blood pressure drastically improved into the 193 systolic.  I suspect absence of her home blood pressure medicine throughout the day because her blood pressure to steadily rise.  After it was given it has been improving.  Patient be discharged home back to her facility for further outpatient management.     Clinical Impression: 1. Disorientation   2. Fever in adult     Disposition: Discharge  Condition: Good  I have discussed the results, Dx and Tx plan with the pt(& family if present). He/she/they expressed understanding and agree(s) with the plan. Discharge instructions discussed at great length. Strict return precautions discussed and pt &/or family have verbalized understanding of the instructions. No further questions at time of discharge.    New Prescriptions   No medications on file    Follow Up: Reymundo Poll, MD [address] [place] [address] [phone]  In 2 days      Tegeler, Gwenyth Allegra, MD 10/23/17 [phone]

## 2017-10-22 NOTE — Discharge Instructions (Signed)
Your workup here was without bacterial source of your fever.  You may have a viral illness.  Take tylenol regularly for the next couple of days.  Return to the ED for worsening.

## 2017-10-22 NOTE — ED Notes (Signed)
Advised RN Lytle Michaels of critical result for Lactic Acid.

## 2017-10-22 NOTE — ED Provider Notes (Signed)
[facility] EMERGENCY DEPARTMENT Provider Note   CSN: [id number] Arrival date & time: 10/22/17  1203     History   Chief Complaint Chief Complaint  Patient presents with  . Altered Mental Status    HPI Virginia Meyer is a 84 y.o. female.  84 yo F with a chief complaints of altered mental status and fever.  This been going on for the past couple days.  Patient is demented and provides limited history.  Level 5 caveat dementia.  She does say that she has been coughing.  Feels that her abdomen is been mildly crampy.  She feels unwell.  Provides limited other history.  The history is provided by the patient and the nursing home.  Illness  This is a new problem. The current episode started 2 days ago. The problem has not changed since onset.Pertinent negatives include no chest pain, no headaches and no shortness of breath. Nothing aggravates the symptoms. Nothing relieves the symptoms. She has tried nothing for the symptoms.    Past Medical History:  Diagnosis Date  . Anxiety   . CHF (congestive heart failure) ([place])   . Fall   . GERD (gastroesophageal reflux disease)   . Heart failure ([place])   . HLD (hyperlipidemia)   . HTN (hypertension)   . Hyperlipidemia   . Hypotension   . Major depressive disorder   . Neuropathy   . PVD (peripheral vascular disease) ([place])   . Stroke [facility])    left sided deficit  . UTI (urinary tract infection)     Patient Active Problem List   Diagnosis Date Noted  . Seizures ([place]) 01/07/2017  . Seizure ([place]) 01/06/2017  . Transient alteration of awareness   . Labile blood pressure   . Hypertensive urgency 10/27/2016  . Decreased responsiveness   . Bradycardia 10/19/2016  . Hypotension 10/19/2016  . Skull lesion 10/19/2016  . History of cerebrovascular accident (CVA) with residual deficit 10/19/2016  . Personal history of atrial flutter 10/19/2016  . Thoracic aortic atherosclerosis ([place]) 10/19/2016  . Intracranial  atherosclerosis 10/19/2016  . Frailty syndrome in geriatric patient 10/19/2016  . Acute kidney injury ([place]) 10/19/2016  . 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 Syncope, Recurrent 10/18/2016  . Carotid artery disease ([place]) 07/06/2016  . Gait abnormality 05/13/2016  . Cerebrovascular accident (CVA) due to thrombosis of right anterior cerebral artery ([place]) 05/13/2016  . History of heart block 05/07/2016  . Benign labile hypertension 08/21/2015  . Acute CVA (cerebrovascular accident) ([place]) 08/04/2015  . Left hemiparesis ([place]) 08/04/2015  . Primary osteoarthritis of left knee 08/04/2015  . Esophageal reflux 08/04/2015  . Hyperlipidemia LDL goal <70 08/04/2015  . Paroxysmal atrial fibrillation ([place]) 08/04/2015  . PVD (peripheral vascular disease) ([place]) 08/04/2015  . Protein-calorie malnutrition ([place]) 08/04/2015    Past Surgical History:  Procedure Laterality Date  . ABDOMINAL HYSTERECTOMY    . FOOT NEUROMA SURGERY    . SPINE SURGERY    . TONSILLECTOMY       OB History   None      Home Medications    Prior to Admission medications   Medication Sig Start Date End Date Taking? Authorizing Provider  amiodarone (PACERONE) 100 MG tablet Take 1 tablet (100 mg total) daily by mouth. 01/09/17   Velna Ochs, MD  apixaban (ELIQUIS) 2.5 MG TABS tablet Take 1 tablet (2.5 mg total) by mouth 2 (two) times daily. 10/16/15   Rosalin Hawking, MD  aspirin EC 81 MG  EC tablet Take 1 tablet (81 mg total) daily by mouth. 01/09/17   Velna Ochs, MD  atorvastatin (LIPITOR) 40 MG tablet Take 1 tablet (40 mg total) by mouth daily at 6 PM. 10/21/16   Everrett Coombe, MD  capsaicin (ZOSTRIX) 0.025 % cream Apply 1 application at bedtime topically. APPLY BILATERAL TO FEET AT BEDTIME FOR PAIN    [provider]  gabapentin (NEURONTIN) 100 MG capsule Take 1 capsule (100 mg total) by mouth 2 (two) times daily. 100 mg in the morning and 200 mg at bedtime 10/29/16   Nuala Alpha, DO  hydrochlorothiazide (MICROZIDE) 12.5 MG capsule Take 1 capsule (12.5 mg total) daily by mouth. 01/09/17   Velna Ochs, MD  levETIRAcetam (KEPPRA) 500 MG tablet Take 1 tablet (500 mg total) 2 (two) times daily by mouth. 01/08/17   Velna Ochs, MD  losartan (COZAAR) 100 MG tablet Take 1 tablet (100 mg total) daily by mouth. 01/09/17   Velna Ochs, MD  meclizine (ANTIVERT) 12.5 MG tablet Take 12.5 mg by mouth every 8 (eight) hours as needed for dizziness. Not to exceed 3 doses/24 hours    [provider]  Menthol, Topical Analgesic, (BIOFREEZE) 4 % GEL Apply 1 application topically See admin instructions. FOUR TIMES A DAY FOR BILATERAL KNEE PAIN    [provider]  polyethylene glycol (MIRALAX / GLYCOLAX) packet Take 17 g by mouth daily as needed for mild constipation. To be mixed with 6 ounces of fluid    [provider]  sertraline (ZOLOFT) 25 MG tablet Take 25 mg by mouth daily.    [provider]  traMADol (ULTRAM) 50 MG tablet Take 25 mg by mouth every 8 (eight) hours as needed (for pain).     [provider]    Family History Family History  Problem Relation Age of Onset  . Heart attack Brother   . Stroke Neg Hx   . Neuropathy Neg Hx     Social History Social History   Tobacco Use  . Smoking status: Never Smoker  . Smokeless tobacco: Never Used  Substance Use Topics  . Alcohol use: No    Alcohol/week: 0.0 standard drinks  . Drug use: No     Allergies   Patient has no known allergies.   Review of Systems Review of Systems  Constitutional: Positive for activity change. Negative for chills and fever.  HENT: Negative for congestion and rhinorrhea.   Eyes: Negative for redness and visual disturbance.  Respiratory: Positive for cough. Negative for shortness of breath and wheezing.   Cardiovascular: Negative for chest pain and palpitations.  Gastrointestinal: Negative for nausea and vomiting.    Genitourinary: Negative for dysuria and urgency.  Musculoskeletal: Negative for arthralgias and myalgias.  Skin: Negative for pallor and wound.  Neurological: Negative for dizziness and headaches.     Physical Exam Updated Vital Signs Temp 100 F (37.8 C) (Rectal)   Physical Exam  Constitutional: She appears well-developed and well-nourished. No distress.  Chronically ill-appearing.  HENT:  Head: Normocephalic and atraumatic.  Eyes: Pupils are equal, round, and reactive to light. EOM are normal.  Neck: Normal range of motion. Neck supple.  Cardiovascular: Normal rate and regular rhythm. Exam reveals no gallop and no friction rub.  No murmur heard. Pulmonary/Chest: Effort normal. She has no wheezes. She has no rales.  Abdominal: Soft. She exhibits no distension and no mass. There is no tenderness. There is no guarding.  Musculoskeletal: She exhibits no edema or tenderness.  Neurological: She is alert.  Skin: Skin is warm and dry. She is not diaphoretic.  Nursing note and vitals reviewed.    ED Treatments / Results  Labs (all labs ordered are listed, but only abnormal results are displayed) Labs Reviewed  COMPREHENSIVE METABOLIC PANEL - Abnormal; Notable for the following components:      Result Value   Potassium 3.2 (*)    Glucose, Bld 123 (*)    Creatinine, Ser 1.02 (*)    GFR calc non Af Amer 48 (*)    GFR calc Af Amer 56 (*)    All other components within normal limits  CBC WITH DIFFERENTIAL/PLATELET - Abnormal; Notable for the following components:   HCT 46.1 (*)    All other components within normal limits  URINALYSIS, ROUTINE W REFLEX MICROSCOPIC - Abnormal; Notable for the following components:   APPearance HAZY (*)    Hgb urine dipstick SMALL (*)    Protein, ur 30 (*)    Leukocytes, UA TRACE (*)    Bacteria, UA RARE (*)    All other components within normal limits  I-STAT CG4 LACTIC ACID, ED - Abnormal; Notable for the following components:   Lactic Acid,  Venous 2.05 (*)    All other components within normal limits  CULTURE, BLOOD (ROUTINE X 2)  CULTURE, BLOOD (ROUTINE X 2)  URINE CULTURE  I-STAT CG4 LACTIC ACID, ED    EKG EKG Interpretation  Date/Time:  Saturday October 22 2017 12:52:16 EDT Ventricular Rate:  70 PR Interval:    QRS Duration: 114 QT Interval:  455 QTC Calculation: 491 R Axis:   -29 Text Interpretation:  Accelerated junctional rhythm Left ventricular hypertrophy Anterior Q waves, possibly due to LVH Artifact in lead(s) I II aVR st morphology similar to 10/28/16 Otherwise no significant change Confirmed by Deno Etienne [phone]) on 10/22/2017 1:47:03 PM   Radiology Dg Chest 2 View  Result Date: 10/22/2017 CLINICAL DATA:  84 year old female with altered mental status for the past 2 days EXAM: CHEST - 2 VIEW COMPARISON:  Prior chest x-ray 08/31/2017 FINDINGS: Low inspiratory volumes. Cardiac and mediastinal contours are unchanged. Atherosclerotic calcifications are present in the transverse aorta. No evidence of focal airspace consolidation, pulmonary edema, pleural effusion or pneumothorax. Stable chronic bronchitic changes. Degenerative changes present in the right glenohumeral joint suggesting chronic rotator cuff injury. IMPRESSION: Stable chest x-ray without evidence of acute cardiopulmonary process. Aortic Atherosclerosis (ICD10-170.0) Electronically Signed   By: Jacqulynn Cadet M.D.   On: 10/22/2017 13:15   Ct Head Wo Contrast  Result Date: 10/22/2017 CLINICAL DATA:  Pt has AMS has been confused for 2 days EXAM: CT HEAD WITHOUT CONTRAST TECHNIQUE: Contiguous axial images were obtained from the base of the skull through the vertex without intravenous contrast. COMPARISON:  01/06/2017 CT and MRI, CT more on 01/07/2016 FINDINGS: [place]: There is significant central and cortical atrophy. Severe periventricular white matter changes are consistent with small vessel disease. Large area of encephalomalacia involving the RIGHT middle  cerebral artery distribution appears stable. There is no intra or extra-axial fluid collection or mass lesion. The basilar cisterns and ventricles have a normal appearance. There is no CT evidence for acute infarction or hemorrhage. Vascular: There is extensive atherosclerotic calcification of the internal carotid arteries. No hyperdense vessels. Skull: Lucent lesion in the LEFT frontal bone measures 2.2 centimeters, unchanged. The calvarium appears heterogeneous. Question RIGHT parietal bone lesion, 5 millimeters. Stable appearance of RIGHT frontal lesion measuring 7 millimeters. Sinuses/Orbits: Paranasal sinuses are unremarkable. Orbits  are clear. Other: None IMPRESSION: 1. Significant atrophy and small vessel disease. 2. Stable appearance of old RIGHT middle cerebral artery infarct. 3.  No evidence for acute intracranial abnormality. 4. Stable calvarial lesions. Electronically Signed   By: Nolon Nations M.D.   On: 10/22/2017 14:50    Procedures Procedures (including critical care time)  Medications Ordered in ED Medications  acetaminophen (TYLENOL) tablet 1,000 mg (has no administration in time range)     Initial Impression / Assessment and Plan / ED Course  I have reviewed the triage vital signs and the nursing notes.  Pertinent labs & imaging results that were available during my care of the patient were reviewed by me and considered in my medical decision making (see chart for details).     84 yo F with a chief complaint of altered mental status and fatigue.  Going on for the past couple days.  Was febrile at the nursing home.  Had a temperature of 100.2 here.  Patient with no focal findings on my exam.  No abdominal tenderness clear lung sounds.  Chest x-ray is negative for focal infiltrate is viewed by me.  Her potassium is mildly low at 3.2.  Creatinine is at baseline.  Will obtain a UA labs.  Her EKG has a decrease to the slope of the ST segments in the anterior leads.  I will repeat  the EKG.  She is not complaining of chest pain.  Not complaining of shortness of breath.  Workup otherwise unremarkable.  Most likely viral illness, at baseline from what I can tell.  D/c home.   3:07 PM:  I have discussed the diagnosis/risks/treatment options with the patient and family and believe the pt to be eligible for discharge home to follow-up with PCP. We also discussed returning to the ED immediately if new or worsening sx occur. We discussed the sx which are most concerning (e.g., sudden worsening pain, fever, inability to tolerate by mouth) that necessitate immediate return. Medications administered to the patient during their visit and any new prescriptions provided to the patient are listed below.  Medications given during this visit Medications  acetaminophen (TYLENOL) tablet 1,000 mg (has no administration in time range)      The patient appears reasonably screen and/or stabilized for discharge and I doubt any other medical condition or other [facility] requiring further screening, evaluation, or treatment in the ED at this time prior to discharge.    Final Clinical Impressions(s) / ED Diagnoses   Final diagnoses:  Disorientation  Fever in adult    ED Discharge Orders    None       Deno Etienne, DO 10/22/17 1507

## 2017-10-24 DIAGNOSIS — F419 Anxiety disorder, unspecified: Secondary | ICD-10-CM | POA: Diagnosis not present

## 2017-10-24 DIAGNOSIS — R69 Illness, unspecified: Secondary | ICD-10-CM | POA: Diagnosis not present

## 2017-10-25 DIAGNOSIS — G4089 Other seizures: Secondary | ICD-10-CM | POA: Diagnosis not present

## 2017-10-25 DIAGNOSIS — I69852 Hemiplegia and hemiparesis following other cerebrovascular disease affecting left dominant side: Secondary | ICD-10-CM | POA: Diagnosis not present

## 2017-10-25 DIAGNOSIS — Z79899 Other long term (current) drug therapy: Secondary | ICD-10-CM | POA: Diagnosis not present

## 2017-10-25 DIAGNOSIS — R69 Illness, unspecified: Secondary | ICD-10-CM | POA: Diagnosis not present

## 2017-10-25 DIAGNOSIS — Z5181 Encounter for therapeutic drug level monitoring: Secondary | ICD-10-CM | POA: Diagnosis not present

## 2017-10-25 DIAGNOSIS — R5381 Other malaise: Secondary | ICD-10-CM | POA: Diagnosis not present

## 2017-10-25 DIAGNOSIS — E039 Hypothyroidism, unspecified: Secondary | ICD-10-CM | POA: Diagnosis not present

## 2017-10-25 LAB — URINE CULTURE: Culture: >=100000 — AB

## 2017-10-26 ENCOUNTER — Telehealth: Payer: Self-pay | Admitting: Emergency Medicine

## 2017-10-26 NOTE — Telephone Encounter (Signed)
Post ED Visit - Positive Culture Follow-up  Culture report reviewed by antimicrobial stewardship pharmacist:  []  Elenor Quinones, Pharm.D. []  Heide Guile, Pharm.D., BCPS AQ-ID []  Parks Neptune, Pharm.D., BCPS []  Alycia Rossetti, Pharm.D., BCPS []  [place], Pharm.D., BCPS, AAHIVP []  Legrand Como, Pharm.D., BCPS, AAHIVP []  Salome Arnt, PharmD, BCPS []  Johnnette Gourd, PharmD, BCPS []  Hughes Better, PharmD, BCPS []  Leeroy Cha, PharmD  Positive urine culture Treated with none, asymptomatic,  no further patient follow-up is required at this time.  Hazle Nordmann 10/26/2017, 10:53 AM

## 2017-10-27 LAB — CULTURE, BLOOD (ROUTINE X 2)
CULTURE: NO GROWTH
CULTURE: NO GROWTH
Special Requests: ADEQUATE

## 2017-10-28 DIAGNOSIS — Z9181 History of falling: Secondary | ICD-10-CM | POA: Diagnosis not present

## 2017-10-28 DIAGNOSIS — M6281 Muscle weakness (generalized): Secondary | ICD-10-CM | POA: Diagnosis not present

## 2017-11-01 ENCOUNTER — Encounter (HOSPITAL_COMMUNITY): Payer: Self-pay | Admitting: Emergency Medicine

## 2017-11-01 ENCOUNTER — Emergency Department (HOSPITAL_COMMUNITY): Payer: Medicare HMO

## 2017-11-01 ENCOUNTER — Observation Stay (HOSPITAL_COMMUNITY)
Admission: EM | Admit: 2017-11-01 | Discharge: 2017-11-03 | Disposition: A | Discharge status: Home / Self Care | Payer: Medicare HMO | Attending: Internal Medicine | Admitting: Internal Medicine

## 2017-11-01 DIAGNOSIS — I10 Essential (primary) hypertension: Secondary | ICD-10-CM | POA: Diagnosis present

## 2017-11-01 DIAGNOSIS — I959 Hypotension, unspecified: Secondary | ICD-10-CM | POA: Diagnosis not present

## 2017-11-01 DIAGNOSIS — Z7982 Long term (current) use of aspirin: Secondary | ICD-10-CM | POA: Diagnosis not present

## 2017-11-01 DIAGNOSIS — I110 Hypertensive heart disease with heart failure: Secondary | ICD-10-CM | POA: Insufficient documentation

## 2017-11-01 DIAGNOSIS — D72829 Elevated white blood cell count, unspecified: Secondary | ICD-10-CM | POA: Insufficient documentation

## 2017-11-01 DIAGNOSIS — I69354 Hemiplegia and hemiparesis following cerebral infarction affecting left non-dominant side: Secondary | ICD-10-CM | POA: Diagnosis not present

## 2017-11-01 DIAGNOSIS — R7989 Other specified abnormal findings of blood chemistry: Secondary | ICD-10-CM

## 2017-11-01 DIAGNOSIS — F0390 Unspecified dementia without behavioral disturbance: Secondary | ICD-10-CM | POA: Diagnosis present

## 2017-11-01 DIAGNOSIS — K219 Gastro-esophageal reflux disease without esophagitis: Secondary | ICD-10-CM | POA: Insufficient documentation

## 2017-11-01 DIAGNOSIS — I447 Left bundle-branch block, unspecified: Secondary | ICD-10-CM | POA: Diagnosis not present

## 2017-11-01 DIAGNOSIS — R410 Disorientation, unspecified: Secondary | ICD-10-CM | POA: Diagnosis not present

## 2017-11-01 DIAGNOSIS — Z7901 Long term (current) use of anticoagulants: Secondary | ICD-10-CM | POA: Insufficient documentation

## 2017-11-01 DIAGNOSIS — I509 Heart failure, unspecified: Secondary | ICD-10-CM | POA: Insufficient documentation

## 2017-11-01 DIAGNOSIS — N179 Acute kidney failure, unspecified: Secondary | ICD-10-CM | POA: Diagnosis not present

## 2017-11-01 DIAGNOSIS — Z66 Do not resuscitate: Secondary | ICD-10-CM | POA: Diagnosis not present

## 2017-11-01 DIAGNOSIS — Z993 Dependence on wheelchair: Secondary | ICD-10-CM | POA: Insufficient documentation

## 2017-11-01 DIAGNOSIS — Z79899 Other long term (current) drug therapy: Secondary | ICD-10-CM | POA: Insufficient documentation

## 2017-11-01 DIAGNOSIS — G9341 Metabolic encephalopathy: Secondary | ICD-10-CM | POA: Diagnosis not present

## 2017-11-01 DIAGNOSIS — M25562 Pain in left knee: Secondary | ICD-10-CM | POA: Diagnosis not present

## 2017-11-01 DIAGNOSIS — G40909 Epilepsy, unspecified, not intractable, without status epilepticus: Secondary | ICD-10-CM | POA: Diagnosis not present

## 2017-11-01 DIAGNOSIS — R0902 Hypoxemia: Secondary | ICD-10-CM | POA: Diagnosis not present

## 2017-11-01 DIAGNOSIS — F329 Major depressive disorder, single episode, unspecified: Secondary | ICD-10-CM | POA: Diagnosis not present

## 2017-11-01 DIAGNOSIS — M25561 Pain in right knee: Secondary | ICD-10-CM | POA: Diagnosis not present

## 2017-11-01 DIAGNOSIS — R69 Illness, unspecified: Secondary | ICD-10-CM | POA: Diagnosis not present

## 2017-11-01 DIAGNOSIS — R569 Unspecified convulsions: Secondary | ICD-10-CM

## 2017-11-01 DIAGNOSIS — F419 Anxiety disorder, unspecified: Secondary | ICD-10-CM | POA: Diagnosis not present

## 2017-11-01 DIAGNOSIS — R000 Tachycardia, unspecified: Secondary | ICD-10-CM | POA: Diagnosis not present

## 2017-11-01 DIAGNOSIS — R4182 Altered mental status, unspecified: Secondary | ICD-10-CM | POA: Diagnosis not present

## 2017-11-01 DIAGNOSIS — R778 Other specified abnormalities of plasma proteins: Secondary | ICD-10-CM

## 2017-11-01 DIAGNOSIS — I739 Peripheral vascular disease, unspecified: Secondary | ICD-10-CM | POA: Insufficient documentation

## 2017-11-01 DIAGNOSIS — E785 Hyperlipidemia, unspecified: Secondary | ICD-10-CM | POA: Diagnosis present

## 2017-11-01 DIAGNOSIS — R945 Abnormal results of liver function studies: Secondary | ICD-10-CM | POA: Insufficient documentation

## 2017-11-01 DIAGNOSIS — R739 Hyperglycemia, unspecified: Secondary | ICD-10-CM | POA: Diagnosis present

## 2017-11-01 DIAGNOSIS — I480 Paroxysmal atrial fibrillation: Secondary | ICD-10-CM | POA: Diagnosis not present

## 2017-11-01 DIAGNOSIS — R5381 Other malaise: Secondary | ICD-10-CM | POA: Diagnosis not present

## 2017-11-01 DIAGNOSIS — N390 Urinary tract infection, site not specified: Secondary | ICD-10-CM | POA: Diagnosis not present

## 2017-11-01 DIAGNOSIS — I4891 Unspecified atrial fibrillation: Secondary | ICD-10-CM | POA: Diagnosis not present

## 2017-11-01 LAB — CBC
HCT: 38.7 % (ref 36.0–46.0)
Hemoglobin: 12.4 g/dL (ref 12.0–15.0)
MCH: 29.2 pg (ref 26.0–34.0)
MCHC: 32 g/dL (ref 30.0–36.0)
MCV: 91.1 fL (ref 78.0–100.0)
Platelets: 225 10*3/uL (ref 150–400)
RBC: 4.25 MIL/uL (ref 3.87–5.11)
RDW: 14.4 % (ref 11.5–15.5)
WBC: 16.6 10*3/uL — ABNORMAL HIGH (ref 4.0–10.5)

## 2017-11-01 LAB — URINALYSIS, ROUTINE W REFLEX MICROSCOPIC
Bilirubin Urine: NEGATIVE
Glucose, UA: NEGATIVE mg/dL
Hgb urine dipstick: NEGATIVE
KETONES UR: NEGATIVE mg/dL
NITRITE: NEGATIVE
PH: 5 (ref 5.0–8.0)
Protein, ur: NEGATIVE mg/dL
Specific Gravity, Urine: 1.016 (ref 1.005–1.030)

## 2017-11-01 LAB — DIFFERENTIAL
ABS IMMATURE GRANULOCYTES: 0.1 10*3/uL (ref 0.0–0.1)
Basophils Absolute: 0 10*3/uL (ref 0.0–0.1)
Basophils Relative: 0 %
Eosinophils Absolute: 0 10*3/uL (ref 0.0–0.7)
Eosinophils Relative: 0 %
Immature Granulocytes: 1 %
Lymphocytes Relative: 4 %
Lymphs Abs: 0.6 10*3/uL — ABNORMAL LOW (ref 0.7–4.0)
MONOS PCT: 9 %
Monocytes Absolute: 1.4 10*3/uL — ABNORMAL HIGH (ref 0.1–1.0)
NEUTROS ABS: 14.3 10*3/uL — AB (ref 1.7–7.7)
Neutrophils Relative %: 86 %

## 2017-11-01 LAB — COMPREHENSIVE METABOLIC PANEL
ALK PHOS: 85 U/L (ref 38–126)
ALT: 77 U/L — AB (ref 0–44)
AST: 105 U/L — AB (ref 15–41)
Albumin: 3.1 g/dL — ABNORMAL LOW (ref 3.5–5.0)
Anion gap: 18 — ABNORMAL HIGH (ref 5–15)
BILIRUBIN TOTAL: 0.5 mg/dL (ref 0.3–1.2)
BUN: 53 mg/dL — AB (ref 8–23)
CHLORIDE: 105 mmol/L (ref 98–111)
CO2: 19 mmol/L — ABNORMAL LOW (ref 22–32)
CREATININE: 2.97 mg/dL — AB (ref 0.44–1.00)
Calcium: 9.7 mg/dL (ref 8.9–10.3)
GFR calc Af Amer: 15 mL/min — ABNORMAL LOW (ref >60)
GFR, EST NON AFRICAN AMERICAN: 13 mL/min — AB (ref >60)
Glucose, Bld: 124 mg/dL — ABNORMAL HIGH (ref 70–99)
Potassium: 4.1 mmol/L (ref 3.5–5.1)
Sodium: 142 mmol/L (ref 135–145)
Total Protein: 6.3 g/dL — ABNORMAL LOW (ref 6.5–8.1)

## 2017-11-01 LAB — TROPONIN I: TROPONIN I: 0.18 ng/mL — AB (ref <0.03)

## 2017-11-01 LAB — RAPID URINE DRUG SCREEN, HOSP PERFORMED
AMPHETAMINES: NOT DETECTED
Barbiturates: NOT DETECTED
Benzodiazepines: NOT DETECTED
Cocaine: NOT DETECTED
OPIATES: NOT DETECTED
TETRAHYDROCANNABINOL: NOT DETECTED

## 2017-11-01 LAB — PROTIME-INR
INR: 1.45
PROTHROMBIN TIME: 17.5 s — AB (ref 11.4–15.2)

## 2017-11-01 LAB — ETHANOL: Alcohol, Ethyl (B): <10 mg/dL (ref <10)

## 2017-11-01 LAB — APTT: aPTT: 35 seconds (ref 24–36)

## 2017-11-01 MED ORDER — AMIODARONE HCL 200 MG PO TABS
100.0000 mg | ORAL_TABLET | Freq: Every day | ORAL | Status: DC
  Administered 2017-11-02 – 2017-11-03 (×2): 100 mg via ORAL
  Filled 2017-11-01 (×2): qty 1

## 2017-11-01 MED ORDER — HYDRALAZINE HCL 20 MG/ML IJ SOLN: 5.0000 mg | INTRAMUSCULAR | Status: DC | PRN

## 2017-11-01 MED ORDER — DOCUSATE SODIUM 100 MG PO CAPS
100.0000 mg | ORAL_CAPSULE | Freq: Two times a day (BID) | ORAL | Status: DC
  Administered 2017-11-01 – 2017-11-03 (×4): 100 mg via ORAL
  Filled 2017-11-01 (×4): qty 1

## 2017-11-01 MED ORDER — MENTHOL-ZINC OXIDE 0.44-20.6 % EX OINT: 1.0000 "application " | TOPICAL_OINTMENT | Freq: Three times a day (TID) | CUTANEOUS | Status: DC

## 2017-11-01 MED ORDER — SODIUM CHLORIDE 0.9 % IV BOLUS (SEPSIS)
1000.0000 mL | Freq: Once | INTRAVENOUS | Status: AC
  Administered 2017-11-01: 1000 mL via INTRAVENOUS

## 2017-11-01 MED ORDER — LEVOCETIRIZINE DIHYDROCHLORIDE 5 MG PO TABS: 5.0000 mg | ORAL_TABLET | Freq: Every day | ORAL | Status: DC

## 2017-11-01 MED ORDER — PANTOPRAZOLE SODIUM 40 MG PO TBEC
40.0000 mg | DELAYED_RELEASE_TABLET | Freq: Every day | ORAL | Status: DC
  Administered 2017-11-02 – 2017-11-03 (×2): 40 mg via ORAL
  Filled 2017-11-01 (×2): qty 1

## 2017-11-01 MED ORDER — LEVETIRACETAM 500 MG PO TABS
500.0000 mg | ORAL_TABLET | Freq: Two times a day (BID) | ORAL | Status: DC
  Administered 2017-11-01 – 2017-11-03 (×4): 500 mg via ORAL
  Filled 2017-11-01 (×5): qty 1

## 2017-11-01 MED ORDER — ACETAMINOPHEN 650 MG RE SUPP: 650.0000 mg | Freq: Four times a day (QID) | RECTAL | Status: DC | PRN

## 2017-11-01 MED ORDER — CAPSAICIN 0.025 % EX CREA
1.0000 "application " | TOPICAL_CREAM | Freq: Every day | CUTANEOUS | Status: DC
  Administered 2017-11-01 – 2017-11-02 (×2): 1 via TOPICAL
  Filled 2017-11-01: qty 60

## 2017-11-01 MED ORDER — ONDANSETRON HCL 4 MG PO TABS: 4.0000 mg | ORAL_TABLET | Freq: Four times a day (QID) | ORAL | Status: DC | PRN

## 2017-11-01 MED ORDER — GABAPENTIN 100 MG PO CAPS: 100.0000 mg | ORAL_CAPSULE | ORAL | Status: DC

## 2017-11-01 MED ORDER — ATORVASTATIN CALCIUM 40 MG PO TABS: 40.0000 mg | ORAL_TABLET | Freq: Every day | ORAL | Status: DC

## 2017-11-01 MED ORDER — APIXABAN 2.5 MG PO TABS
2.5000 mg | ORAL_TABLET | Freq: Two times a day (BID) | ORAL | Status: DC
  Administered 2017-11-01 – 2017-11-03 (×4): 2.5 mg via ORAL
  Filled 2017-11-01 (×4): qty 1

## 2017-11-01 MED ORDER — ACETAMINOPHEN 325 MG PO TABS
650.0000 mg | ORAL_TABLET | Freq: Four times a day (QID) | ORAL | Status: DC | PRN
  Administered 2017-11-02: 650 mg via ORAL
  Filled 2017-11-01: qty 2

## 2017-11-01 MED ORDER — SODIUM CHLORIDE 0.9 % IV SOLN
1000.0000 mL | INTRAVENOUS | Status: DC
  Administered 2017-11-01: 1000 mL via INTRAVENOUS

## 2017-11-01 MED ORDER — GABAPENTIN 100 MG PO CAPS
100.0000 mg | ORAL_CAPSULE | Freq: Every day | ORAL | Status: DC
  Administered 2017-11-02 – 2017-11-03 (×2): 100 mg via ORAL
  Filled 2017-11-01 (×2): qty 1

## 2017-11-01 MED ORDER — POLYETHYLENE GLYCOL 3350 17 G PO PACK: 17.0000 g | PACK | Freq: Every day | ORAL | Status: DC | PRN

## 2017-11-01 MED ORDER — GABAPENTIN 100 MG PO CAPS
200.0000 mg | ORAL_CAPSULE | Freq: Every day | ORAL | Status: DC
  Administered 2017-11-01 – 2017-11-02 (×2): 200 mg via ORAL
  Filled 2017-11-01 (×2): qty 2

## 2017-11-01 MED ORDER — SERTRALINE HCL 50 MG PO TABS
50.0000 mg | ORAL_TABLET | Freq: Every day | ORAL | Status: DC
  Administered 2017-11-01 – 2017-11-02 (×2): 50 mg via ORAL
  Filled 2017-11-01 (×2): qty 1

## 2017-11-01 MED ORDER — ONDANSETRON HCL 4 MG/2ML IJ SOLN: 4.0000 mg | Freq: Four times a day (QID) | INTRAMUSCULAR | Status: DC | PRN

## 2017-11-01 MED ORDER — MUSCLE RUB 10-15 % EX CREA
TOPICAL_CREAM | Freq: Three times a day (TID) | CUTANEOUS | Status: DC
  Administered 2017-11-02: 22:00:00 via TOPICAL
  Filled 2017-11-01: qty 85

## 2017-11-01 MED ORDER — LORATADINE 10 MG PO TABS
10.0000 mg | ORAL_TABLET | Freq: Every day | ORAL | Status: DC
  Administered 2017-11-01 – 2017-11-02 (×2): 10 mg via ORAL
  Filled 2017-11-01 (×2): qty 1

## 2017-11-01 MED ORDER — LACTATED RINGERS IV SOLN
INTRAVENOUS | Status: DC
  Administered 2017-11-01 – 2017-11-03 (×4): via INTRAVENOUS

## 2017-11-01 NOTE — ED Provider Notes (Signed)
[facility] EMERGENCY DEPARTMENT Provider Note   CSN: [id number] Arrival date & time: 11/01/17  1332     History   Chief Complaint Chief Complaint  Patient presents with  . Atrial Fibrillation  . Altered Mental Status    HPI Virginia Meyer is a 84 y.o. female.  HPI Patient presents to the emergency room for altered mental status.  Patient is a resident of [facility].  According to the EMS report the doctor was rounding on the patient and they noted that she was in A. fib.  She also seemed to be leaning towards the right side and appeared more confused.  According to the notes the patient at baseline has confusion but this is worse than usual.  Last time the patient was at her normal self is unknown.  The patient herself is unable to tell me what brings her here.  She does not answer questions consistently.  She denies any specific complaints. Past Medical History:  Diagnosis Date  . Anxiety   . CHF (congestive heart failure) ([place])   . Fall   . GERD (gastroesophageal reflux disease)   . Heart failure ([place])   . HLD (hyperlipidemia)   . HTN (hypertension)   . Hyperlipidemia   . Hypotension   . Major depressive disorder   . Neuropathy   . PVD (peripheral vascular disease) ([place])   . Stroke [facility])    left sided deficit  . UTI (urinary tract infection)     Patient Active Problem List   Diagnosis Date Noted  . Seizures ([place]) 01/07/2017  . Seizure ([place]) 01/06/2017  . Transient alteration of awareness   . Labile blood pressure   . Hypertensive urgency 10/27/2016  . Decreased responsiveness   . Bradycardia 10/19/2016  . Hypotension 10/19/2016  . Skull lesion 10/19/2016  . History of cerebrovascular accident (CVA) with residual deficit 10/19/2016  . Personal history of atrial flutter 10/19/2016  . Thoracic aortic atherosclerosis ([place]) 10/19/2016  . Intracranial atherosclerosis 10/19/2016  . Frailty syndrome in geriatric patient 10/19/2016  . Acute  kidney injury ([place]) 10/19/2016  . 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 Syncope, Recurrent 10/18/2016  . Carotid artery disease ([place]) 07/06/2016  . Gait abnormality 05/13/2016  . Cerebrovascular accident (CVA) due to thrombosis of right anterior cerebral artery ([place]) 05/13/2016  . History of heart block 05/07/2016  . Benign labile hypertension 08/21/2015  . Acute CVA (cerebrovascular accident) ([place]) 08/04/2015  . Left hemiparesis ([place]) 08/04/2015  . Primary osteoarthritis of left knee 08/04/2015  . Esophageal reflux 08/04/2015  . Hyperlipidemia LDL goal <70 08/04/2015  . Paroxysmal atrial fibrillation ([place]) 08/04/2015  . PVD (peripheral vascular disease) ([place]) 08/04/2015  . Protein-calorie malnutrition ([place]) 08/04/2015    Past Surgical History:  Procedure Laterality Date  . ABDOMINAL HYSTERECTOMY    . FOOT NEUROMA SURGERY    . SPINE SURGERY    . TONSILLECTOMY       OB History   None      Home Medications    Prior to Admission medications   Medication Sig Start Date End Date Taking? Authorizing Provider  amiodarone (PACERONE) 100 MG tablet Take 1 tablet (100 mg total) daily by mouth. 01/09/17   Velna Ochs, MD  apixaban (ELIQUIS) 2.5 MG TABS tablet Take 1 tablet (2.5 mg total) by mouth 2 (two) times daily. 10/16/15   Rosalin Hawking, MD  aspirin EC 81 MG EC tablet Take 1 tablet (81 mg total) daily by mouth. 01/09/17  Velna Ochs, MD  atorvastatin (LIPITOR) 40 MG tablet Take 1 tablet (40 mg total) by mouth daily at 6 PM. 10/21/16   Everrett Coombe, MD  capsaicin (ZOSTRIX) 0.025 % cream Apply 1 application at bedtime topically. APPLY BILATERAL TO FEET AT BEDTIME FOR PAIN    [provider]  gabapentin (NEURONTIN) 100 MG capsule Take 1 capsule (100 mg total) by mouth 2 (two) times daily. 100 mg in the morning and 200 mg at bedtime 10/29/16   Nuala Alpha, DO  hydrochlorothiazide (MICROZIDE) 12.5 MG capsule Take 1 capsule (12.5 mg  total) daily by mouth. 01/09/17   Velna Ochs, MD  levETIRAcetam (KEPPRA) 500 MG tablet Take 1 tablet (500 mg total) 2 (two) times daily by mouth. 01/08/17   Velna Ochs, MD  losartan (COZAAR) 100 MG tablet Take 1 tablet (100 mg total) daily by mouth. 01/09/17   Velna Ochs, MD  meclizine (ANTIVERT) 12.5 MG tablet Take 12.5 mg by mouth every 8 (eight) hours as needed for dizziness. Not to exceed 3 doses/24 hours    [provider]  Menthol, Topical Analgesic, (BIOFREEZE) 4 % GEL Apply 1 application topically See admin instructions. FOUR TIMES A DAY FOR BILATERAL KNEE PAIN    [provider]  polyethylene glycol (MIRALAX / GLYCOLAX) packet Take 17 g by mouth daily as needed for mild constipation. To be mixed with 6 ounces of fluid    [provider]  sertraline (ZOLOFT) 25 MG tablet Take 25 mg by mouth daily.    [provider]  traMADol (ULTRAM) 50 MG tablet Take 25 mg by mouth every 8 (eight) hours as needed (for pain).     [provider]    Family History Family History  Problem Relation Age of Onset  . Heart attack Brother   . Stroke Neg Hx   . Neuropathy Neg Hx     Social History Social History   Tobacco Use  . Smoking status: Never Smoker  . Smokeless tobacco: Never Used  Substance Use Topics  . Alcohol use: No    Alcohol/week: 0.0 standard drinks  . Drug use: No     Allergies   Patient has no known allergies.   Review of Systems Review of Systems  All other systems reviewed and are negative.    Physical Exam Updated Vital Signs BP (!) 99/43   Pulse (!) 104   Temp 99.1 F (37.3 C) (Oral)   Resp 20   SpO2 96%   Physical Exam  Constitutional: No distress.  HENT:  Head: Normocephalic and atraumatic.  Right Ear: External ear normal.  Left Ear: External ear normal.  Eyes: Conjunctivae are normal. Right eye exhibits no discharge. Left eye exhibits no discharge. No scleral icterus.  Neck: Neck  supple. No tracheal deviation present.  Cardiovascular: Normal rate, regular rhythm and intact distal pulses.  Pulmonary/Chest: Effort normal and breath sounds normal. No stridor. No respiratory distress. She has no wheezes. She has no rales.  Abdominal: Soft. Bowel sounds are normal. She exhibits no distension. There is no tenderness. There is no rebound and no guarding.  Musculoskeletal: She exhibits no edema or tenderness.  Neurological: She is alert. No cranial nerve deficit (no facial droop,  , no slurred speech ) or sensory deficit. She exhibits abnormal muscle tone. She displays no seizure activity. Coordination abnormal.  Patient's eyes are open and she intermittently follows commands such as wiggling her fingers when I asked her, patient's answers however are nonsensical, when asked her  how she is feeling today she answers with, "that is a beautiful baby", patient is sitting with her legs crossed and leaning to the right, I cannot get her to lift her legs off the bed although when I extend her legs she will move them back into the coronal position, she does weakly grip my fingers bilaterally  Skin: Skin is warm and dry. No rash noted.  Psychiatric: She has a normal mood and affect.  Nursing note and vitals reviewed.    ED Treatments / Results  Labs (all labs ordered are listed, but only abnormal results are displayed) Labs Reviewed  PROTIME-INR - Abnormal; Notable for the following components:      Result Value   Prothrombin Time 17.5 (*)    All other components within normal limits  CBC - Abnormal; Notable for the following components:   WBC 16.6 (*)    All other components within normal limits  DIFFERENTIAL - Abnormal; Notable for the following components:   Neutro Abs 14.3 (*)    Lymphs Abs 0.6 (*)    Monocytes Absolute 1.4 (*)    All other components within normal limits  COMPREHENSIVE METABOLIC PANEL - Abnormal; Notable for the following components:   CO2 19 (*)     Glucose, Bld 124 (*)    BUN 53 (*)    Creatinine, Ser 2.97 (*)    Total Protein 6.3 (*)    Albumin 3.1 (*)    AST 105 (*)    ALT 77 (*)    GFR calc non Af Amer 13 (*)    GFR calc Af Amer 15 (*)    Anion gap 18 (*)    All other components within normal limits  TROPONIN I - Abnormal; Notable for the following components:   Troponin I 0.18 (*)    All other components within normal limits  ETHANOL  APTT  RAPID URINE DRUG SCREEN, HOSP PERFORMED  URINALYSIS, ROUTINE W REFLEX MICROSCOPIC    EKG EKG Interpretation  Date/Time:  Tuesday November 01 2017 13:41:31 EDT Ventricular Rate:  108 PR Interval:    QRS Duration: 132 QT Interval:  381 QTC Calculation: 511 R Axis:   7 Text Interpretation:  Atrial fibrillation Left bundle branch block Baseline wander in lead(s) II III aVF atrial fibrillation new since last tracing Confirmed by Dorie Rank [phone]) on 11/01/2017 1:47:33 PM Also confirmed by Dorie Rank [phone]), editor Philomena Doheny [phone])  on 11/01/2017 2:32:32 PM   Radiology Ct Head Wo Contrast  Result Date: 11/01/2017 CLINICAL DATA:  Altered mental status EXAM: CT HEAD WITHOUT CONTRAST TECHNIQUE: Contiguous axial images were obtained from the base of the skull through the vertex without intravenous contrast. COMPARISON:  October 22, 2017 and multiple prior head CT examinations. [facility] MRI January 06, 2017 FINDINGS: [place]: There is moderate diffuse atrophy, stable. There is no evident mass, hemorrhage, extra-axial fluid collection, or midline shift. There is evidence of a prior right middle cerebral artery distribution infarct. There is extensive small vessel disease throughout the centra semiovale bilaterally. There is small vessel disease throughout the left external capsule and posterior limb of the right internal capsule. Small vessel disease is noted in each thalamus with lacunar type infarcts in each thalamus. There is a small lacunar infarct in the left mid cerebellum. No acute  appearing infarct is evident. Vascular: No hyperdense vessel evident. There is calcification in the right distal vertebral artery as well as in both carotid siphon regions. Skull: A stable lucent area the left  frontal bone measures 1.9 x 0.9 cm. There is a stable smaller lucent area in the right frontal bone measuring 7 x 5 mm. There are several smaller lytic appearing lesions throughout the skull as well, all subcentimeter. There is a degree of underlying osteoporosis. Sinuses/Orbits: There is mucosal thickening in several ethmoid air cells. Other visualized paranasal sinuses are clear. Orbits appear symmetric bilaterally. Other: Mastoid air cells are clear. IMPRESSION: 1. Prior right middle cerebral artery distribution infarct. Elsewhere there is extensive small vessel disease in the supratentorial white matter. There are lacunar type infarcts in each thalamus, stable. There is a small lacunar infarct in the left cerebellum. No acute infarct evident. No mass or hemorrhage. Moderate underlying atrophy is stable. 2. Multiple lucent lesions in the calvarium remain stable, largest in the left frontal bone. Etiology uncertain. Multiple myeloma could present in this manner. 3.  There are foci of arterial vascular calcification. 4.  There is mucosal thickening in several ethmoid air cells. Electronically Signed   By: Lowella Grip III M.D.   On: 11/01/2017 14:52   Dg Chest Portable 1 View  Result Date: 11/01/2017 CLINICAL DATA:  84 year old nursing home patient found to have atrial fibrillation on her clinical examination this morning. Acute confusion. EXAM: PORTABLE CHEST 1 VIEW COMPARISON:  10/22/2017, 08/31/2017 and earlier. FINDINGS: Suboptimal inspiration. The patient's chin obscures the RIGHT apex. Cardiac silhouette moderately enlarged, unchanged. Mild atelectasis in the RIGHT lung base. Lungs otherwise clear. Pulmonary vascularity normal. IMPRESSION: Suboptimal inspiration which accounts for mild RIGHT  basilar atelectasis. No acute cardiopulmonary disease otherwise. Stable cardiomegaly without evidence of pulmonary edema. Electronically Signed   By: Evangeline Dakin M.D.   On: 11/01/2017 14:24    Procedures Procedures (including critical care time)  Medications Ordered in ED Medications - No data to display   Initial Impression / Assessment and Plan / ED Course  I have reviewed the triage vital signs and the nursing notes.  Pertinent labs & imaging results that were available during my care of the patient were reviewed by me and considered in my medical decision making (see chart for details).  Clinical Course as of Nov 01 1612  Tue Nov 01, 2017  1608 Labs notable for acute kidney injury.  Patient's BUN and creatinine are elevated compared to previous values.  She also has a metabolic acidosis with an elevated anion gap.   [XB]  3532 Chest x-ray with pneumonia.  White blood cell count is elevated although no obvious infection at this point.  Urinalysis still pending.   [JK]    Clinical Course User Index [JK] Dorie Rank, MD    Patient presents to the emergency room for altered mental status.  She was recently seen in the emergency room on August 31.  At that time she had disorientation and fever.  The source of infection noted at that time.  Patient came to the emergency room today with worsening mental status.  Patient does appear confused in the emergency room.  No obvious focal neurologic symptoms but her neurologic exam is limited.  Her laboratory tests are notable for acute kidney injury.  Patient also has atrial fibrillation on her EKG but records indicate she does have history of paroxysmal atrial fibrillation.  I have ordered IV fluids.  Could consider doing an MRI to rule out any acute stroke.  Final Clinical Impressions(s) / ED Diagnoses   Final diagnoses:  AKI (acute kidney injury) ([place])  Elevated troponin       Dorie Rank,  MD 11/02/17 3810

## 2017-11-01 NOTE — ED Notes (Signed)
Dr. Tomi Bamberger aware of trop 0.18; no further orders recieved

## 2017-11-01 NOTE — H&P (Signed)
History and Physical    Virginia Meyer ZOX:[id number] DOB: 28-Oct-1928 DOA: 11/01/2017  PCP: Reymundo Poll, MD [facility]  Chief Complaint: AMS  HPI: Virginia Meyer is a 84 y.o. female with medical history significant of CVA; PVD; depression; dementia; HTN; HLD; and CHF presenting with AMS.  She is oriented to person and time but not place.  She was unaccompanied but reported that her son is sitting on one side of the room and her daughter is on the other.  She called me various names, [name] and [name] among others.   ED Course:  No fever, definitely confused.  Has acute renal failure, possibly primary issue.  Troponin 0.18, EKG with afib and LBBB.  WBC 16.6.  CXR negative for PNA, head CT chronic.  UA is pending.  Given IVF.  Uncertain cause for renal failure.  Review of Systems:  Unable to perform  PMH, PSH, SH, and FH were reviewed in Epic  Past Medical History:  Diagnosis Date  . Anxiety   . CHF (congestive heart failure) ([place])   . Fall   . GERD (gastroesophageal reflux disease)   . Heart failure ([place])   . HLD (hyperlipidemia)   . HTN (hypertension)   . Hypotension   . Major depressive disorder   . Neuropathy   . PVD (peripheral vascular disease) ([place])   . Stroke [facility])    left sided deficit  . UTI (urinary tract infection)     Past Surgical History:  Procedure Laterality Date  . ABDOMINAL HYSTERECTOMY    . FOOT NEUROMA SURGERY    . SPINE SURGERY    . TONSILLECTOMY      Social History   Socioeconomic History  . Marital status: Widowed    Spouse name: Not on file  . Number of children: 4  . Years of education: 32  . Highest education level: Not on file  Occupational History  . Occupation: Retired  Scientific laboratory technician  . Financial resource strain: Not on file  . Food insecurity:    Worry: Not on file    Inability: Not on file  . Transportation needs:    Medical: Not on file    Non-medical: Not on file  Tobacco Use  . Smoking status:  Never Smoker  . Smokeless tobacco: Never Used  Substance and Sexual Activity  . Alcohol use: No    Alcohol/week: 0.0 standard drinks  . Drug use: No  . Sexual activity: Not on file  Lifestyle  . Physical activity:    Days per week: Not on file    Minutes per session: Not on file  . Stress: Not on file  Relationships  . Social connections:    Talks on phone: Not on file    Gets together: Not on file    Attends religious service: Not on file    Active member of club or organization: Not on file    Attends meetings of clubs or organizations: Not on file    Relationship status: Not on file  . Intimate partner violence:    Fear of current or ex partner: Not on file    Emotionally abused: Not on file    Physically abused: Not on file    Forced sexual activity: Not on file  Other Topics Concern  . Not on file  Social History Narrative   Lives at [facility]   Caffeine use: 1 cup tea/day   1 cup coffee/day    No Known Allergies  Family  History  Problem Relation Age of Onset  . Heart attack Brother   . Stroke Neg Hx   . Neuropathy Neg Hx     Prior to Admission medications   Medication Sig Start Date End Date Taking? Authorizing Provider  amiodarone (PACERONE) 100 MG tablet Take 1 tablet (100 mg total) daily by mouth. 01/09/17   Velna Ochs, MD  apixaban (ELIQUIS) 2.5 MG TABS tablet Take 1 tablet (2.5 mg total) by mouth 2 (two) times daily. 10/16/15   Rosalin Hawking, MD  aspirin EC 81 MG EC tablet Take 1 tablet (81 mg total) daily by mouth. 01/09/17   Velna Ochs, MD  atorvastatin (LIPITOR) 40 MG tablet Take 1 tablet (40 mg total) by mouth daily at 6 PM. 10/21/16   Everrett Coombe, MD  capsaicin (ZOSTRIX) 0.025 % cream Apply 1 application at bedtime topically. APPLY BILATERAL TO FEET AT BEDTIME FOR PAIN    [provider]  gabapentin (NEURONTIN) 100 MG capsule Take 1 capsule (100 mg total) by mouth 2 (two) times daily. 100 mg in the morning and 200 mg at  bedtime 10/29/16   Nuala Alpha, DO  hydrochlorothiazide (MICROZIDE) 12.5 MG capsule Take 1 capsule (12.5 mg total) daily by mouth. 01/09/17   Velna Ochs, MD  levETIRAcetam (KEPPRA) 500 MG tablet Take 1 tablet (500 mg total) 2 (two) times daily by mouth. 01/08/17   Velna Ochs, MD  losartan (COZAAR) 100 MG tablet Take 1 tablet (100 mg total) daily by mouth. 01/09/17   Velna Ochs, MD  meclizine (ANTIVERT) 12.5 MG tablet Take 12.5 mg by mouth every 8 (eight) hours as needed for dizziness. Not to exceed 3 doses/24 hours    [provider]  Menthol, Topical Analgesic, (BIOFREEZE) 4 % GEL Apply 1 application topically See admin instructions. FOUR TIMES A DAY FOR BILATERAL KNEE PAIN    [provider]  polyethylene glycol (MIRALAX / GLYCOLAX) packet Take 17 g by mouth daily as needed for mild constipation. To be mixed with 6 ounces of fluid    [provider]  sertraline (ZOLOFT) 25 MG tablet Take 25 mg by mouth daily.    [provider]  traMADol (ULTRAM) 50 MG tablet Take 25 mg by mouth every 8 (eight) hours as needed (for pain).     [provider]    Physical Exam: Vitals:   11/01/17 1400 11/01/17 1545 11/01/17 1615 11/01/17 1730  BP: (!) 105/54 (!) 99/43 108/79 102/61  Pulse: (!) 102 (!) 104 91 90  Resp: 19 20 (!) 26 (!) 21  Temp:      TempSrc:      SpO2: 97% 96% 95% 100%     General:  Appears calm and comfortable and is NAD; she is clearly confused Eyes:  PERRL, EOMI, normal lids, iris ENT:  grossly normal hearing, lips & tongue, mmm; appropriate dentition Neck:  no LAD, masses or thyromegaly Cardiovascular:  Mild tachycardia, no m/r/g. No LE edema.  Respiratory:   CTA bilaterally with no wheezes/rales/rhonchi.  Normal respiratory effort. Abdomen:  soft, NT, ND, NABS Skin:  no rash or induration seen on limited exam Musculoskeletal:  grossly normal tone BUE/BLE, good ROM, no bony abnormality Psychiatric:  Alert,  interactive, follows some commands, oriented x 2 but clearly confused with some apparent visual hallucinations Neurologic: unable to perform    Radiological Exams on Admission: Ct Head Wo Contrast  Result Date: 11/01/2017 CLINICAL DATA:  Altered mental status EXAM: CT HEAD WITHOUT CONTRAST TECHNIQUE: Contiguous axial images  were obtained from the base of the skull through the vertex without intravenous contrast. COMPARISON:  October 22, 2017 and multiple prior head CT examinations. [place] MRI January 06, 2017 FINDINGS: Becvar: There is moderate diffuse atrophy, stable. There is no evident mass, hemorrhage, extra-axial fluid collection, or midline shift. There is evidence of a prior right middle cerebral artery distribution infarct. There is extensive small vessel disease throughout the centra semiovale bilaterally. There is small vessel disease throughout the left external capsule and posterior limb of the right internal capsule. Small vessel disease is noted in each thalamus with lacunar type infarcts in each thalamus. There is a small lacunar infarct in the left mid cerebellum. No acute appearing infarct is evident. Vascular: No hyperdense vessel evident. There is calcification in the right distal vertebral artery as well as in both carotid siphon regions. Skull: A stable lucent area the left frontal bone measures 1.9 x 0.9 cm. There is a stable smaller lucent area in the right frontal bone measuring 7 x 5 mm. There are several smaller lytic appearing lesions throughout the skull as well, all subcentimeter. There is a degree of underlying osteoporosis. Sinuses/Orbits: There is mucosal thickening in several ethmoid air cells. Other visualized paranasal sinuses are clear. Orbits appear symmetric bilaterally. Other: Mastoid air cells are clear. IMPRESSION: 1. Prior right middle cerebral artery distribution infarct. Elsewhere there is extensive small vessel disease in the supratentorial white matter. There are  lacunar type infarcts in each thalamus, stable. There is a small lacunar infarct in the left cerebellum. No acute infarct evident. No mass or hemorrhage. Moderate underlying atrophy is stable. 2. Multiple lucent lesions in the calvarium remain stable, largest in the left frontal bone. Etiology uncertain. Multiple myeloma could present in this manner. 3.  There are foci of arterial vascular calcification. 4.  There is mucosal thickening in several ethmoid air cells. Electronically Signed   By: Lowella Grip III M.D.   On: 11/01/2017 14:52   Dg Chest Portable 1 View  Result Date: 11/01/2017 CLINICAL DATA:  84 year old nursing home patient found to have atrial fibrillation on her clinical examination this morning. Acute confusion. EXAM: PORTABLE CHEST 1 VIEW COMPARISON:  10/22/2017, 08/31/2017 and earlier. FINDINGS: Suboptimal inspiration. The patient's chin obscures the RIGHT apex. Cardiac silhouette moderately enlarged, unchanged. Mild atelectasis in the RIGHT lung base. Lungs otherwise clear. Pulmonary vascularity normal. IMPRESSION: Suboptimal inspiration which accounts for mild RIGHT basilar atelectasis. No acute cardiopulmonary disease otherwise. Stable cardiomegaly without evidence of pulmonary edema. Electronically Signed   By: Evangeline Dakin M.D.   On: 11/01/2017 14:24    EKG: Independently reviewed.  Afib with rate 108; chronic LBBB without significant changes   Labs on Admission: I have personally reviewed the available labs and imaging studies at the time of the admission.  Pertinent labs:   CO2 19 Glucose 124 BUN 53/Creatinine 2.97/GFR 15 Anion gap 18 Albumin 3.1 AST 105/ALT 77 Troponin 0.18 WBC 16.6, CBC otherwise WNL INR 1.45 ETOH <10  Assessment/Plan Principal Problem:   Acute metabolic encephalopathy Active Problems:   Hyperlipidemia LDL goal <70   Paroxysmal atrial fibrillation (HCC)   Seizures (HCC)   Acute renal failure (ARF) (HCC)   Essential hypertension    Hyperglycemia   Dementia   Acute metabolic encephalopathy -Patient with underlying dementia  presenting with confusion from SNF -There is documentation from Dr. Tomi Bamberger that she "at baseline has confusion but this is worse than usual" -She appears to have delusions/halluicinations -She was previously evaluated for AMS  on 8/31 in the ER and discharged back to her facility with suspicion of viral illness -Evaluation in the ER shows acute renal failure which is suspected as the cause -Will admit, rehydrate, and follow -Of note, she was apparently taking Bactrim at her facility for uncertain reason; will hold  Acute renal failure -Baseline creatinine is 1.02 from 8/31.   -Today's creatinine is 2.97  -Likely due to prerenal failure secondary to dehydration and continuation of ARB,HCTZ, and possible recent viral illness. -Hold nephrotoxic medications for now -IVF  -Follow up renal function by BMP  Dementia -Patient with underlying dementia, which is likely exacerbated by current delirium -She does not appear to be taking medications for this issue -Based on multiple recent ER visits, her dementia may be advancing  Afib on Eliquis -Fairly rate controlled on Amio -Rate control is likely to improve with rehydration -Continue Eliquis for now  Seizure d/o -Continue Keppra  HTN -Hold ARB and HCTZ -Will add prn IV hydralazine  HLD -Continue Lipitor -Statins can exacerbate memory loss and so risk/benefits could be considered in the future  Hyperglycemia -May be stress response -Will follow with fasting AM labs -It is unlikely that he will need acute or chronic treatment for this issue   DVT prophylaxis: Eliquis Code Status:  DNR - confirmed with paperwork from her facility Family Communication: None present  Disposition Plan:  Back to ALF once clinically improved Consults called: None Admission status: Admit - It is my clinical opinion that admission to INPATIENT is reasonable and  necessary because of the expectation that this patient will require hospital care that crosses at least 2 midnights to treat this condition based on the medical complexity of the problems presented.  Given the aforementioned information, the predictability of an adverse outcome is felt to be significant.    Karmen Bongo MD [facility]  If note is complete, please contact covering daytime or nighttime physician. [email or web address] Password [facility]  11/01/2017, 6:57 PM

## 2017-11-01 NOTE — ED Triage Notes (Signed)
Pt arrives via EMS from [facility] with reports of A fib when the doctor was rounding this AM. EMS noted pt to be leaning towards right side and altered. Per facility, pt is baseline altered but more than normal. Facility unknown about LSN.

## 2017-11-02 DIAGNOSIS — G9341 Metabolic encephalopathy: Secondary | ICD-10-CM | POA: Diagnosis not present

## 2017-11-02 DIAGNOSIS — R748 Abnormal levels of other serum enzymes: Secondary | ICD-10-CM | POA: Diagnosis not present

## 2017-11-02 DIAGNOSIS — I10 Essential (primary) hypertension: Secondary | ICD-10-CM | POA: Diagnosis not present

## 2017-11-02 DIAGNOSIS — N179 Acute kidney failure, unspecified: Secondary | ICD-10-CM

## 2017-11-02 DIAGNOSIS — R69 Illness, unspecified: Secondary | ICD-10-CM | POA: Diagnosis not present

## 2017-11-02 LAB — CBC
HCT: 35.8 % — ABNORMAL LOW (ref 36.0–46.0)
Hemoglobin: 11.5 g/dL — ABNORMAL LOW (ref 12.0–15.0)
MCH: 28.7 pg (ref 26.0–34.0)
MCHC: 32.1 g/dL (ref 30.0–36.0)
MCV: 89.3 fL (ref 78.0–100.0)
Platelets: 177 10*3/uL (ref 150–400)
RBC: 4.01 MIL/uL (ref 3.87–5.11)
RDW: 14.3 % (ref 11.5–15.5)
WBC: 9.6 10*3/uL (ref 4.0–10.5)

## 2017-11-02 LAB — BASIC METABOLIC PANEL
Anion gap: 11 (ref 5–15)
BUN: 48 mg/dL — AB (ref 8–23)
CALCIUM: 9.2 mg/dL (ref 8.9–10.3)
CO2: 19 mmol/L — ABNORMAL LOW (ref 22–32)
CREATININE: 1.85 mg/dL — AB (ref 0.44–1.00)
Chloride: 110 mmol/L (ref 98–111)
GFR, EST AFRICAN AMERICAN: 27 mL/min — AB (ref >60)
GFR, EST NON AFRICAN AMERICAN: 24 mL/min — AB (ref >60)
Glucose, Bld: 85 mg/dL (ref 70–99)
Potassium: 3.6 mmol/L (ref 3.5–5.1)
SODIUM: 140 mmol/L (ref 135–145)

## 2017-11-02 LAB — MRSA PCR SCREENING: MRSA BY PCR: NEGATIVE

## 2017-11-02 NOTE — Discharge Instructions (Signed)

## 2017-11-02 NOTE — Social Work (Signed)
CSW aware pt from [facility], admitted under observation.  [place], [place] Work [phone]

## 2017-11-02 NOTE — Progress Notes (Signed)
Progress Note    Virginia Meyer  KPQ:[id number] DOB: 1928-06-05  DOA: 11/01/2017 PCP: Reymundo Poll, MD    Brief Narrative:   Chief complaint:   Medical records reviewed and are as summarized below:  Virginia Meyer is an 84 y.o. female with medical history significant of CVA; PVD; depression; dementia; HTN; HLD; and CHF presenting with AMS.  She is oriented to person and time but not place.  She was unaccompanied but reported that her son is sitting on one side of the room and her daughter is on the other.  She called the admitter various names, [name] and [name] among others.  Assessment/Plan:   Principal Problem:   Acute metabolic encephalopathy Active Problems:   Hyperlipidemia LDL goal <70   Paroxysmal atrial fibrillation (HCC)   Seizures (HCC)   Acute renal failure (ARF) (HCC)   Essential hypertension   Hyperglycemia   Dementia  Acute metabolic encephalopathy -Patient with underlying dementia presenting with confusion from SNF -I called her facility and at baseline she is wheelchair bound but is appropriate and A+Ox3 with occasional confusion -IN ER, she appears to have delusions/halluicinations -She was previously evaluated for AMS on 8/31 in the ER and discharged back to her facility with suspicion of viral illness but culture was positive and she was started on bactrim for UTI on 9/4 -Evaluation in the ER shows acute renal failure which is suspected as the cause -Will admit, rehydrate, and follow  Acute renal failure -Baseline creatinine is 1.02 from 8/31.   -Likely due to prerenal failure secondary to dehydration and continuation of ARB,HCTZ, and abx -Hold nephrotoxic medications for now -IVF  -Follow up renal function by BMP in AM  Dementia -Patient with underlying dementia (per facility baseline is A+Ox3 with occasional confusions) -from prior ER visit, she had gotten better but then worsened, her PCP saw her and was worried about her heart-- it was irregular  and has episodes of fast rate  Afib on Eliquis - Amio -add tele -Rate control may improve with rehydration -Continue Eliquis for now -elevated troponin due to fast rate and AKI  Seizure d/o -Continue Keppra  Leukocytosis -resolved  HTN -Hold ARB and HCTZ   HLD -hold Lipitor -Statins can exacerbate memory loss and so risk/benefits could be considered in the future  Elevated LFTs -hold statin  Hyperglycemia -May be stress response -glucose 85 this AM  Uric acid in urine-- no sign of gout-- does have chronic pain in b/l knees  Family Communication/Anticipated D/C date and plan/Code Status   DVT prophylaxis: eliquis Code Status: DNR Family Communication: called facility Disposition Plan: pending   Medical Consultants:    None.     Subjective:   Remains confused about place C/o b/l knee pain-- says she uses "blue rhino" for pain control  Objective:    Vitals:   11/01/17 2200 11/01/17 2300 11/02/17 0058 11/02/17 0446  BP: (!) 113/52 123/80 (!) 131/57   Pulse: 88 95 81   Resp:  18 18   Temp:   98.5 F (36.9 C)   TempSrc:   Oral   SpO2: 92% 96% 90%   Weight:    54.1 kg  Height:    _0  (1.6 m)    Intake/Output Summary (Last 24 hours) at 11/02/2017 0858 Last data filed at 11/02/2017 0600 Gross per 24 hour  Intake 2091.68 ml  Output -  Net 2091.68 ml   Filed Weights   11/02/17 0446  Weight: 54.1 kg  Exam: In bed, NAD irr +BS, soft Alert-- oriented to person, and time (initally said 2017 but self corrected to 2019) but thinks she is in [place]:   I have personally reviewed following labs and imaging studies:  Labs: Labs show the following:   Basic Metabolic Panel: Recent Labs  Lab 11/01/17 1359 11/02/17 0556  NA 142 140  K 4.1 3.6  CL 105 110  CO2 19* 19*  GLUCOSE 124* 85  BUN 53* 48*  CREATININE 2.97* 1.85*  CALCIUM 9.7 9.2   GFR Estimated Creatinine Clearance: 18.1 mL/min (A) (by C-G  formula based on SCr of 1.85 mg/dL (H)). Liver Function Tests: Recent Labs  Lab 11/01/17 1359  AST 105*  ALT 77*  ALKPHOS 85  BILITOT 0.5  PROT 6.3*  ALBUMIN 3.1*   No results for input(s): LIPASE, AMYLASE in the last 168 hours. No results for input(s): AMMONIA in the last 168 hours. Coagulation profile Recent Labs  Lab 11/01/17 1359  INR 1.45    CBC: Recent Labs  Lab 11/01/17 1359 11/02/17 0556  WBC 16.6* 9.6  NEUTROABS 14.3*  --   HGB 12.4 11.5*  HCT 38.7 35.8*  MCV 91.1 89.3  PLT 225 177   Cardiac Enzymes: Recent Labs  Lab 11/01/17 1430  TROPONINI 0.18*   BNP (last 3 results) No results for input(s): PROBNP in the last 8760 hours. CBG: No results for input(s): GLUCAP in the last 168 hours. D-Dimer: No results for input(s): DDIMER in the last 72 hours. Hgb A1c: No results for input(s): HGBA1C in the last 72 hours. Lipid Profile: No results for input(s): CHOL, HDL, LDLCALC, TRIG, CHOLHDL, LDLDIRECT in the last 72 hours. Thyroid function studies: No results for input(s): TSH, T4TOTAL, T3FREE, THYROIDAB in the last 72 hours.  Invalid input(s): FREET3 Anemia work up: No results for input(s): VITAMINB12, FOLATE, FERRITIN, TIBC, IRON, RETICCTPCT in the last 72 hours. Sepsis Labs: Recent Labs  Lab 11/01/17 1359 11/02/17 0556  WBC 16.6* 9.6    Microbiology Recent Results (from the past 240 hour(s))  MRSA PCR Screening     Status: None   Collection Time: 11/02/17  6:07 AM  Result Value Ref Range Status   MRSA by PCR NEGATIVE NEGATIVE Final    Comment:        The GeneXpert MRSA Assay (FDA approved for NASAL specimens only), is one component of a comprehensive MRSA colonization surveillance program. It is not intended to diagnose MRSA infection nor to guide or monitor treatment for MRSA infections. Performed at [facility], [place] [address] [place], [place] [address]     Procedures and diagnostic studies:  Ct Head Wo Contrast  Result  Date: 11/01/2017 CLINICAL DATA:  Altered mental status EXAM: CT HEAD WITHOUT CONTRAST TECHNIQUE: Contiguous axial images were obtained from the base of the skull through the vertex without intravenous contrast. COMPARISON:  October 22, 2017 and multiple prior head CT examinations. [place] MRI January 06, 2017 FINDINGS: Bogus: There is moderate diffuse atrophy, stable. There is no evident mass, hemorrhage, extra-axial fluid collection, or midline shift. There is evidence of a prior right middle cerebral artery distribution infarct. There is extensive small vessel disease throughout the centra semiovale bilaterally. There is small vessel disease throughout the left external capsule and posterior limb of the right internal capsule. Small vessel disease is noted in each thalamus with lacunar type infarcts in each thalamus. There is a small lacunar infarct in the left mid cerebellum.  No acute appearing infarct is evident. Vascular: No hyperdense vessel evident. There is calcification in the right distal vertebral artery as well as in both carotid siphon regions. Skull: A stable lucent area the left frontal bone measures 1.9 x 0.9 cm. There is a stable smaller lucent area in the right frontal bone measuring 7 x 5 mm. There are several smaller lytic appearing lesions throughout the skull as well, all subcentimeter. There is a degree of underlying osteoporosis. Sinuses/Orbits: There is mucosal thickening in several ethmoid air cells. Other visualized paranasal sinuses are clear. Orbits appear symmetric bilaterally. Other: Mastoid air cells are clear. IMPRESSION: 1. Prior right middle cerebral artery distribution infarct. Elsewhere there is extensive small vessel disease in the supratentorial white matter. There are lacunar type infarcts in each thalamus, stable. There is a small lacunar infarct in the left cerebellum. No acute infarct evident. No mass or hemorrhage. Moderate underlying atrophy is stable. 2. Multiple lucent  lesions in the calvarium remain stable, largest in the left frontal bone. Etiology uncertain. Multiple myeloma could present in this manner. 3.  There are foci of arterial vascular calcification. 4.  There is mucosal thickening in several ethmoid air cells. Electronically Signed   By: Lowella Grip III M.D.   On: 11/01/2017 14:52   Dg Chest Portable 1 View  Result Date: 11/01/2017 CLINICAL DATA:  84 year old nursing home patient found to have atrial fibrillation on her clinical examination this morning. Acute confusion. EXAM: PORTABLE CHEST 1 VIEW COMPARISON:  10/22/2017, 08/31/2017 and earlier. FINDINGS: Suboptimal inspiration. The patient's chin obscures the RIGHT apex. Cardiac silhouette moderately enlarged, unchanged. Mild atelectasis in the RIGHT lung base. Lungs otherwise clear. Pulmonary vascularity normal. IMPRESSION: Suboptimal inspiration which accounts for mild RIGHT basilar atelectasis. No acute cardiopulmonary disease otherwise. Stable cardiomegaly without evidence of pulmonary edema. Electronically Signed   By: Evangeline Dakin M.D.   On: 11/01/2017 14:24    Medications:   . amiodarone  100 mg Oral Daily  . apixaban  2.5 mg Oral BID  . atorvastatin  40 mg Oral q1800  . capsaicin  1 application Topical QHS  . docusate sodium  100 mg Oral BID  . gabapentin  100 mg Oral Daily  . gabapentin  200 mg Oral QHS  . levETIRAcetam  500 mg Oral BID  . loratadine  10 mg Oral QHS  . MUSCLE RUB   Topical TID  . pantoprazole  40 mg Oral Daily  . sertraline  50 mg Oral QHS   Continuous Infusions: . lactated ringers 100 mL/hr at 11/01/17 1950     LOS: 0 days   Geradine Girt  [facility]   *Please refer to [email or web address], password [email or web address] to get updated schedule on who will round on this patient, as hospitalists switch teams weekly. If 7PM-7AM, please contact night-coverage at [email or web address], password [email or web address] for any overnight needs.  11/02/2017, 8:58 AM

## 2017-11-02 NOTE — ED Notes (Signed)
Attempted report x1. 

## 2017-11-03 DIAGNOSIS — F0390 Unspecified dementia without behavioral disturbance: Secondary | ICD-10-CM

## 2017-11-03 DIAGNOSIS — I10 Essential (primary) hypertension: Secondary | ICD-10-CM | POA: Diagnosis not present

## 2017-11-03 DIAGNOSIS — R279 Unspecified lack of coordination: Secondary | ICD-10-CM | POA: Diagnosis not present

## 2017-11-03 DIAGNOSIS — Z743 Need for continuous supervision: Secondary | ICD-10-CM | POA: Diagnosis not present

## 2017-11-03 DIAGNOSIS — R748 Abnormal levels of other serum enzymes: Secondary | ICD-10-CM | POA: Diagnosis not present

## 2017-11-03 DIAGNOSIS — G9341 Metabolic encephalopathy: Secondary | ICD-10-CM | POA: Diagnosis not present

## 2017-11-03 DIAGNOSIS — R4182 Altered mental status, unspecified: Secondary | ICD-10-CM | POA: Diagnosis not present

## 2017-11-03 DIAGNOSIS — R569 Unspecified convulsions: Secondary | ICD-10-CM | POA: Diagnosis not present

## 2017-11-03 DIAGNOSIS — R410 Disorientation, unspecified: Secondary | ICD-10-CM | POA: Diagnosis not present

## 2017-11-03 DIAGNOSIS — N179 Acute kidney failure, unspecified: Secondary | ICD-10-CM | POA: Diagnosis not present

## 2017-11-03 DIAGNOSIS — R69 Illness, unspecified: Secondary | ICD-10-CM | POA: Diagnosis not present

## 2017-11-03 LAB — COMPREHENSIVE METABOLIC PANEL
ALBUMIN: 2.5 g/dL — AB (ref 3.5–5.0)
ALT: 48 U/L — AB (ref 0–44)
ANION GAP: 8 (ref 5–15)
AST: 57 U/L — ABNORMAL HIGH (ref 15–41)
Alkaline Phosphatase: 65 U/L (ref 38–126)
BILIRUBIN TOTAL: 0.6 mg/dL (ref 0.3–1.2)
BUN: 32 mg/dL — ABNORMAL HIGH (ref 8–23)
CALCIUM: 9 mg/dL (ref 8.9–10.3)
CO2: 20 mmol/L — ABNORMAL LOW (ref 22–32)
Chloride: 112 mmol/L — ABNORMAL HIGH (ref 98–111)
Creatinine, Ser: 1.15 mg/dL — ABNORMAL HIGH (ref 0.44–1.00)
GFR calc Af Amer: 48 mL/min — ABNORMAL LOW (ref >60)
GFR, EST NON AFRICAN AMERICAN: 42 mL/min — AB (ref >60)
Glucose, Bld: 87 mg/dL (ref 70–99)
Potassium: 3.8 mmol/L (ref 3.5–5.1)
Sodium: 140 mmol/L (ref 135–145)
TOTAL PROTEIN: 4.9 g/dL — AB (ref 6.5–8.1)

## 2017-11-03 LAB — CBC
HEMATOCRIT: 33.5 % — AB (ref 36.0–46.0)
HEMOGLOBIN: 10.7 g/dL — AB (ref 12.0–15.0)
MCH: 29.2 pg (ref 26.0–34.0)
MCHC: 31.9 g/dL (ref 30.0–36.0)
MCV: 91.5 fL (ref 78.0–100.0)
Platelets: 164 10*3/uL (ref 150–400)
RBC: 3.66 MIL/uL — AB (ref 3.87–5.11)
RDW: 14.1 % (ref 11.5–15.5)
WBC: 8.1 10*3/uL (ref 4.0–10.5)

## 2017-11-03 MED ORDER — LOSARTAN POTASSIUM 50 MG PO TABS: 25.0000 mg | ORAL_TABLET | Freq: Every day | ORAL | 0 refills | Status: AC

## 2017-11-03 NOTE — Clinical Social Work Placement (Signed)
   CLINICAL SOCIAL WORK PLACEMENT  NOTE Berna Spare RN to call report to [phone]  Date:  11/03/2017  Patient Details  Name: Virginia Meyer MRN: [id number] Date of Birth: [birth year]  Clinical Social Work is seeking post-discharge placement for this patient at the [place] level of care (*CSW will initial, date and re-position this form in  chart as items are completed):  No   Patient/family provided with [place] Work Department's list of facilities offering this level of care within the geographic area requested by the patient (or if unable, by the patient's family).  Yes   Patient/family informed of their freedom to choose among providers that offer the needed level of care, that participate in Medicare, Medicaid or managed care program needed by the patient, have an available bed and are willing to accept the patient.  No   Patient/family informed of [facility]'s ownership interest in [facility] and [facility], as well as of the fact that they are under no obligation to receive care at these facilities.  PASRR submitted to EDS on       PASRR number received on       Existing PASRR number confirmed on       FL2 transmitted to all facilities in geographic area requested by pt/family on 11/03/17     FL2 transmitted to all facilities within larger geographic area on       Patient informed that his/her managed care company has contracts with or will negotiate with certain facilities, including the following:        Yes   Patient/family informed of bed offers received.  Patient chooses bed at [facility]     Physician recommends and patient chooses bed at      Patient to be transferred to [facility] on 11/03/17.  Patient to be transferred to facility by PTAR     Patient family notified on 11/03/17 of transfer.  Name of family member notified:  daughter Virginia Meyer     PHYSICIAN        Additional Comment:    _______________________________________________ Alexander Mt, [place] 11/03/2017, 12:48 PM

## 2017-11-03 NOTE — Progress Notes (Signed)
Patient d/c to [facility], transferred via [place]. Discharge summary provided to PT[facility]. Report called to [place], Therapist, sports at [facility].

## 2017-11-03 NOTE — Social Work (Signed)
Clinical Social Worker facilitated patient discharge including contacting patient family and facility to confirm patient discharge plans.  Clinical information faxed to facility and family agreeable with plan.  CSW arranged ambulance transport via [place] to [facility].  RN to call [phone] with report  prior to discharge.  PTAR called at 12:45 for 1:45pm pick up.   Clinical Social Worker will sign off for now as social work intervention is no longer needed. Please consult [place] again if new need arises.  Alexander Mt, [place] Social Worker [phone]

## 2017-11-03 NOTE — Clinical Social Work Note (Signed)
Clinical Social Work Assessment  Patient Details  Name: Virginia Meyer MRN: [id number] Date of Birth: 1928-05-18  Date of referral:  11/03/17               Reason for consult:  Facility Placement, Discharge Planning                Permission sought to share information with:  Facility Sport and exercise psychologist, Family Supports Permission granted to share information::  No  Name::      Human resources officer::  [facility]  Relationship::  daughter  Contact Information:  [phone]  Housing/Transportation Living arrangements for the past 2 months:  [place] of Information:  Adult Children Patient Interpreter Needed:  None Criminal Activity/Legal Involvement Pertinent to Current Situation/Hospitalization:  No - Comment as needed Significant Relationships:  Adult Children, Community Support Lives with:  Facility Resident Do you feel safe going back to the place where you live?  Yes Need for family participation in patient care:  Yes (Comment)  Care giving concerns:  Pt lives in ALF, uses wheelchair at baseline. Pt has had periods of more intense confusion than family is used to. Per MD notes dementia may be advancing.    Social Worker assessment / plan:  CSW spoke with pt daughter via telephone. Pt daughter states pt has lived at [facility] in [place] since 2017. Pt uses wheelchair to get around which is at ALF and pt daughter amenable with her returning there. Pt daughter states that she has been worried about the level of confusion she's been having and that the pt is demonstrating more confusion than is her baseline. CSW review MD note with daughter and discussed morning report. Pt daughter understands that discharge may be today.   CSW left message with ALF resident coordinator Virginia Meyer to discuss pt discharge back to [facility] today.   Employment status:  Retired Nurse, adult PT Recommendations:  Not assessed  at this time Information / Referral to community resources:  Other (Comment Required)(ALF)  Patient/Family's Response to care:  Pt family understanding of CSW role, medical care and potential for pt to be released back to ALF today.  Patient/Family's Understanding of and Emotional Response to Diagnosis, Current Treatment, and Prognosis:  Pt daughter states understanding of diagnosis, current treatment and prognosis. Pt daughter does express concern that pts confusion has not completely resolved. Pt daughter was emotionally appropriate via telephone and sounds happy with the care being provided at [facility] and here at [facility].   Emotional Assessment Appearance:  Appears stated age Attitude/Demeanor/Rapport:    Affect (typically observed):  Unable to Assess Orientation:  Fluctuating Orientation (Suspected and/or reported Sundowners), Oriented to Self, Oriented to  Time Alcohol / Substance use:  Not Applicable Psych involvement (Current and /or in the community):  No (Comment)  Discharge Needs  Concerns to be addressed:  Care Coordination Readmission within the last 30 days:  No Current discharge risk:  Physical Impairment, Dependent with Mobility, Cognitively Impaired Barriers to Discharge:  Continued Medical Work up   [employer], [place] 11/03/2017, 10:05 AM

## 2017-11-03 NOTE — Discharge Summary (Signed)
Physician Discharge Summary  Virginia Meyer ZOX:[id number] DOB: 1928-05-09 DOA: 11/01/2017  PCP: Reymundo Poll, MD  Admit date: 11/01/2017 Discharge date: 11/03/2017  Admitted From: ALF Discharge disposition: ALF   Recommendations for Outpatient Follow-Up:   ? Worsening of dementia vs worsening depression-- on zoloft-- says she does not like living at [facility] at it is not like home ([place]) -has follow up with opthalmology for CRVO-- ? If visual issues are worsening her dementia Very hard of hearing as well may account for some incorrect answers -depending on prognosis may consider palliative care referral   Discharge Diagnosis:   Principal Problem:   Acute metabolic encephalopathy Active Problems:   Hyperlipidemia LDL goal <70   Paroxysmal atrial fibrillation (HCC)   Seizures (HCC)   Acute renal failure (ARF) (HCC)   Essential hypertension   Hyperglycemia   Dementia    Discharge Condition: Improved.  Diet recommendation: Low sodium, heart healthy.  Wound care: None.  Code status: Full.   History of Present Illness:   Virginia Meyer is an 84 y.o. female with medical history significant ofCVA; PVD; depression;dementia;HTN; HLD; and CHF presenting with AMS. She is oriented to person and time but not place. She was unaccompanied but reported that her son is sitting on one side of the room and her daughter is on the other. She called the admitter various names, [name] and [name] among others.   Hospital Course by Problem:   Acute metabolic encephalopathy -Patient with underlying dementiapresenting with confusion from SNF -I called her facility and at baseline she is wheelchair bound but is appropriate and A+Ox3 with occasional confusion -IN ER, she appeared to have delusions/halluicinations -She was previously evaluated for AMS on 8/31 in the ER and discharged back to her facility with suspicion of viral illness but culture was positive and she  was started on bactrim for UTI on 9/4 -Evaluation in the ER shows acute renal failure which is suspected as the cause -seems to be back from baseline-- suspect above episode from AKI -will need close monitoring by PCP  CRVO -defer to opthamology  Acute renal failure -Baseline creatinine is1  -Likely due to prerenal failure secondary to dehydration and continuation of ARB,HCTZ, and abx -BMP 1 week  Dementia -Patient with underlying dementia (per facility baseline is A+Ox3 with occasional confusions) -from prior ER visit, she had gotten better but then worsened, her PCP saw her and was worried about her heart-- it was irregular and has episodes of fast rate -seems to be back at baseline -hard of hearing so answers questions incorrectly but when asked louder, she will respond appropriately -? Worsening dementia/depression?-- defer to PCP  Afib on Eliquis - [place] -added tele-- fib on monitor but rate controlled -elevated troponin due to fast rate and AKI   Seizure d/o -Continue Keppra  Leukocytosis -resolved  HTN -Hold  HCTZ -start ARB at lower dose -monitor CMP   HLD -hold Lipitor -Statins can exacerbate memory loss and so hold for now  Elevated LFTs -hold statin -follow up outpatient  Hyperglycemia -stress response     Medical Consultants:      Discharge Exam:   Vitals:   11/02/17 2300 11/03/17 0509  BP: 122/72 (!) 147/68  Pulse: 62 72  Resp: 18 16  Temp: 98.7 F (37.1 C) 98.3 F (36.8 C)  SpO2: 95% 94%   Vitals:   11/02/17 0900 11/02/17 1254 11/02/17 2300 11/03/17 0509  BP: (!) 142/130 (!) 128/55 122/72 (!) 147/68  Pulse:  75 75 62 72  Resp:  _0 Temp:  98.2 F (36.8 C) 98.7 F (37.1 C) 98.3 F (36.8 C)  TempSrc:  Axillary Axillary Oral  SpO2: 95%  95% 94%  Weight:      Height:        General exam: Appears calm and comfortable, flat affect    The results of significant diagnostics from this hospitalization (including  imaging, microbiology, ancillary and laboratory) are listed below for reference.     Procedures and Diagnostic Studies:   Ct Head Wo Contrast  Result Date: 11/01/2017 CLINICAL DATA:  Altered mental status EXAM: CT HEAD WITHOUT CONTRAST TECHNIQUE: Contiguous axial images were obtained from the base of the skull through the vertex without intravenous contrast. COMPARISON:  October 22, 2017 and multiple prior head CT examinations. [place] MRI January 06, 2017 FINDINGS: [place]: There is moderate diffuse atrophy, stable. There is no evident mass, hemorrhage, extra-axial fluid collection, or midline shift. There is evidence of a prior right middle cerebral artery distribution infarct. There is extensive small vessel disease throughout the centra semiovale bilaterally. There is small vessel disease throughout the left external capsule and posterior limb of the right internal capsule. Small vessel disease is noted in each thalamus with lacunar type infarcts in each thalamus. There is a small lacunar infarct in the left mid cerebellum. No acute appearing infarct is evident. Vascular: No hyperdense vessel evident. There is calcification in the right distal vertebral artery as well as in both carotid siphon regions. Skull: A stable lucent area the left frontal bone measures 1.9 x 0.9 cm. There is a stable smaller lucent area in the right frontal bone measuring 7 x 5 mm. There are several smaller lytic appearing lesions throughout the skull as well, all subcentimeter. There is a degree of underlying osteoporosis. Sinuses/Orbits: There is mucosal thickening in several ethmoid air cells. Other visualized paranasal sinuses are clear. Orbits appear symmetric bilaterally. Other: Mastoid air cells are clear. IMPRESSION: 1. Prior right middle cerebral artery distribution infarct. Elsewhere there is extensive small vessel disease in the supratentorial white matter. There are lacunar type infarcts in each thalamus, stable. There is  a small lacunar infarct in the left cerebellum. No acute infarct evident. No mass or hemorrhage. Moderate underlying atrophy is stable. 2. Multiple lucent lesions in the calvarium remain stable, largest in the left frontal bone. Etiology uncertain. Multiple myeloma could present in this manner. 3.  There are foci of arterial vascular calcification. 4.  There is mucosal thickening in several ethmoid air cells. Electronically Signed   By: Lowella Grip III M.D.   On: 11/01/2017 14:52   Dg Chest Portable 1 View  Result Date: 11/01/2017 CLINICAL DATA:  84 year old nursing home patient found to have atrial fibrillation on her clinical examination this morning. Acute confusion. EXAM: PORTABLE CHEST 1 VIEW COMPARISON:  10/22/2017, 08/31/2017 and earlier. FINDINGS: Suboptimal inspiration. The patient's chin obscures the RIGHT apex. Cardiac silhouette moderately enlarged, unchanged. Mild atelectasis in the RIGHT lung base. Lungs otherwise clear. Pulmonary vascularity normal. IMPRESSION: Suboptimal inspiration which accounts for mild RIGHT basilar atelectasis. No acute cardiopulmonary disease otherwise. Stable cardiomegaly without evidence of pulmonary edema. Electronically Signed   By: Evangeline Dakin M.D.   On: 11/01/2017 14:24     Labs:   Basic Metabolic Panel: Recent Labs  Lab 11/01/17 1359 11/02/17 0556 11/03/17 0430  NA 142 140 140  K 4.1 3.6 3.8  CL 105 110 112*  CO2 19* 19* 20*  GLUCOSE  124* 85 87  BUN 53* 48* 32*  CREATININE 2.97* 1.85* 1.15*  CALCIUM 9.7 9.2 9.0   GFR Estimated Creatinine Clearance: 29 mL/min (A) (by C-G formula based on SCr of 1.15 mg/dL (H)). Liver Function Tests: Recent Labs  Lab 11/01/17 1359 11/03/17 0430  AST 105* 57*  ALT 77* 48*  ALKPHOS 85 65  BILITOT 0.5 0.6  PROT 6.3* 4.9*  ALBUMIN 3.1* 2.5*   No results for input(s): LIPASE, AMYLASE in the last 168 hours. No results for input(s): AMMONIA in the last 168 hours. Coagulation profile Recent Labs   Lab 11/01/17 1359  INR 1.45    CBC: Recent Labs  Lab 11/01/17 1359 11/02/17 0556 11/03/17 0430  WBC 16.6* 9.6 8.1  NEUTROABS 14.3*  --   --   HGB 12.4 11.5* 10.7*  HCT 38.7 35.8* 33.5*  MCV 91.1 89.3 91.5  PLT 225 177 164     BNP: Invalid input(s): POCBNP CBG: No results for input(s): GLUCAP in the last 168 hours. D-Dimer No results for input(s): DDIMER in the last 72 hours. Hgb A1c No results for input(s): HGBA1C in the last 72 hours. Lipid Profile No results for input(s): CHOL, HDL, LDLCALC, TRIG, CHOLHDL, LDLDIRECT in the last 72 hours. Thyroid function studies No results for input(s): TSH, T4TOTAL, T3FREE, THYROIDAB in the last 72 hours.  Invalid input(s): FREET3 Anemia work up No results for input(s): VITAMINB12, FOLATE, FERRITIN, TIBC, IRON, RETICCTPCT in the last 72 hours. Microbiology Recent Results (from the past 240 hour(s))  MRSA PCR Screening     Status: None   Collection Time: 11/02/17  6:07 AM  Result Value Ref Range Status   MRSA by PCR NEGATIVE NEGATIVE Final    Comment:        The GeneXpert MRSA Assay (FDA approved for NASAL specimens only), is one component of a comprehensive MRSA colonization surveillance program. It is not intended to diagnose MRSA infection nor to guide or monitor treatment for MRSA infections. Performed at [facility], [address] [place], [place] [address]      Discharge Instructions:   Discharge Instructions    Diet - low sodium heart healthy   Complete by:  As directed    Increase activity slowly   Complete by:  As directed      Allergies as of 11/03/2017   No Known Allergies     Medication List    STOP taking these medications   hydrochlorothiazide 12.5 MG capsule Commonly known as:  MICROZIDE   potassium chloride 20 MEQ/15ML (10%) Soln   sulfamethoxazole-trimethoprim 800-160 MG tablet Commonly known as:  BACTRIM DS,SEPTRA DS   traMADol 50 MG tablet Commonly known as:  ULTRAM       TAKE these medications   amiodarone 100 MG tablet Commonly known as:  PACERONE Take 1 tablet (100 mg total) daily by mouth.   apixaban 2.5 MG Tabs tablet Commonly known as:  ELIQUIS Take 1 tablet (2.5 mg total) by mouth 2 (two) times daily.   atorvastatin 40 MG tablet Commonly known as:  LIPITOR Take 1 tablet (40 mg total) by mouth daily at 6 PM.   CALMOSEPTINE 0.44-20.6 % Oint Generic drug:  Menthol-Zinc Oxide Apply 1 application topically 3 (three) times daily. Apply to buttocks for redness   capsaicin 0.025 % cream Commonly known as:  ZOSTRIX Apply 1 application at bedtime topically. APPLY BILATERAL TO FEET AT BEDTIME FOR PAIN   docusate sodium 100 MG capsule Commonly known as:  COLACE   Take 100 mg by mouth 2 (two) times daily.   gabapentin 100 MG capsule Commonly known as:  NEURONTIN Take 1 capsule (100 mg total) by mouth 2 (two) times daily. 100 mg in the morning and 200 mg at bedtime What changed:    when to take this  additional instructions   levETIRAcetam 500 MG tablet Commonly known as:  KEPPRA Take 1 tablet (500 mg total) 2 (two) times daily by mouth.   levocetirizine 5 MG tablet Commonly known as:  XYZAL Take 5 mg by mouth at bedtime.   losartan 50 MG tablet Commonly known as:  COZAAR Take 0.5 tablets (25 mg total) by mouth daily. What changed:  how much to take   meclizine 12.5 MG tablet Commonly known as:  ANTIVERT Take 12.5 mg by mouth every 8 (eight) hours as needed for dizziness. Not to exceed 3 doses/24 hours   Melatonin 3 MG Tabs Take 3 mg by mouth at bedtime.   omeprazole 20 MG capsule Commonly known as:  PRILOSEC Take 20 mg by mouth daily as needed (GERD, indigestion, heartburn.).   polyethylene glycol packet Commonly known as:  MIRALAX / GLYCOLAX Take 17 g by mouth daily as needed for mild constipation. To be mixed with 6 ounces of fluid   Psyllium 500 MG Caps Take 500 mg by mouth at bedtime.   sertraline 50 MG tablet Commonly  known as:  ZOLOFT Take 50 mg by mouth at bedtime.      Follow-up Information    Tripp, Henry, MD Follow up in 1 week(s).   Specialty:  Family Medicine Contact information: [address] [place] [address] [phone]            Time coordinating discharge: 25 min  Signed:   U   [facility] 11/03/2017, 11:30 AM     

## 2017-11-03 NOTE — NC FL2 (Addendum)
[place] LEVEL OF CARE SCREENING TOOL     IDENTIFICATION  Patient Name: Virginia Meyer Birthdate: [birth year] Sex: female Admission Date (Current Location): 11/01/2017  [facility] and [place] Number:  Herbalist and Address:  [facility] [place]. [facility], [place] [address] [place], [place] [address]      Provider Number: [id number]  Attending Physician Name and Address:  Geradine Girt, DO  Relative Name and Phone Number:  Fryda Molenda, son, [phone]    Current Level of Care: Hospital Recommended Level of Care: Assisted Living Facility([facility]) Prior Approval Number:    Date Approved/Denied:   PASRR Number:    Discharge Plan: Other (Comment)(ALF [facility]))    Current Diagnoses: Patient Active Problem List   Diagnosis Date Noted  . Acute renal failure (ARF) (Agency) 11/01/2017  . Acute metabolic encephalopathy 44/96/7591  . Essential hypertension 11/01/2017  . Hyperglycemia 11/01/2017  . Dementia 11/01/2017  . Seizures ([place]) 01/07/2017  . Transient alteration of awareness   . Labile blood pressure   . Hypertensive urgency 10/27/2016  . Decreased responsiveness   . Bradycardia 10/19/2016  . Hypotension 10/19/2016  . Skull lesion 10/19/2016  . History of cerebrovascular accident (CVA) with residual deficit 10/19/2016  . Personal history of atrial flutter 10/19/2016  . Thoracic aortic atherosclerosis ([place]) 10/19/2016  . Intracranial atherosclerosis 10/19/2016  . Frailty syndrome in geriatric patient 10/19/2016  . Acute kidney injury ([place]) 10/19/2016  . 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 Syncope, Recurrent 10/18/2016  . Carotid artery disease ([place]) 07/06/2016  . Gait abnormality 05/13/2016  . Cerebrovascular accident (CVA) due to thrombosis of right anterior cerebral artery ([place]) 05/13/2016  . History of heart block 05/07/2016  . Benign labile hypertension 08/21/2015  . Acute CVA  (cerebrovascular accident) ([place]) 08/04/2015  . Left hemiparesis ([place]) 08/04/2015  . Primary osteoarthritis of left knee 08/04/2015  . Esophageal reflux 08/04/2015  . Hyperlipidemia LDL goal <70 08/04/2015  . Paroxysmal atrial fibrillation ([place]) 08/04/2015  . PVD (peripheral vascular disease) ([place]) 08/04/2015  . Protein-calorie malnutrition ([place]) 08/04/2015    Orientation RESPIRATION BLADDER Height & Weight     Self, Place  Normal Incontinent, External catheter Weight: 119 lb 4.3 oz (54.1 kg) Height:  5\' 3"  (160 cm)  BEHAVIORAL SYMPTOMS/MOOD NEUROLOGICAL BOWEL NUTRITION STATUS      Incontinent Diet(regular diet)  AMBULATORY STATUS COMMUNICATION OF NEEDS Skin   Total Care(wheelchair) Verbally Normal(some cracking)                       Personal Care Assistance Level of Assistance  Bathing, Feeding, Dressing Bathing Assistance: Limited assistance Feeding assistance: Independent Dressing Assistance: Limited assistance     Functional Limitations Info  Sight, Hearing, Speech Sight Info: Adequate Hearing Info: Adequate Speech Info: Adequate    SPECIAL CARE FACTORS FREQUENCY  PT (By licensed PT), OT (By licensed OT)     PT Frequency: continue therapies OT Frequency: continue therapies             Contractures Contractures Info: Not present    Additional Factors Info  Code Status, Allergies, Psychotropic Code Status Info: DNR Allergies Info: No Known Allergies Psychotropic Info: levETIRAcetam (KEPPRA) tablet 500 mg 2x daily; sertraline (ZOLOFT) tablet 50 mg daily at bedtime         Current Medications (11/03/2017):  This is the current hospital active medication list Current Facility-Administered Medications  Medication Dose Route Frequency Provider Last Rate Last Dose  .  acetaminophen (TYLENOL) tablet 650 mg  650 mg Oral Q6H PRN Karmen Bongo, MD   650 mg at 11/02/17 2219   Or  . acetaminophen (TYLENOL) suppository 650 mg  650 mg Rectal Q6H PRN Karmen Bongo, MD      . amiodarone (PACERONE) tablet 100 mg  100 mg Oral Daily Karmen Bongo, MD   100 mg at 11/02/17 1032  . apixaban (ELIQUIS) tablet 2.5 mg  2.5 mg Oral BID Karmen Bongo, MD   2.5 mg at 11/02/17 2220  . capsaicin (ZOSTRIX) 7.915 % cream 1 application  1 application Topical QHS Karmen Bongo, MD   1 application at 05/69/79 2218  . docusate sodium (COLACE) capsule 100 mg  100 mg Oral BID Karmen Bongo, MD   100 mg at 11/02/17 2220  . gabapentin (NEURONTIN) capsule 100 mg  100 mg Oral Daily Karmen Bongo, MD   100 mg at 11/02/17 1031  . gabapentin (NEURONTIN) capsule 200 mg  200 mg Oral QHS Karmen Bongo, MD   200 mg at 11/02/17 2219  . hydrALAZINE (APRESOLINE) injection 5 mg  5 mg Intravenous Q4H PRN Karmen Bongo, MD      . lactated ringers infusion   Intravenous Continuous Karmen Bongo, MD 100 mL/hr at 11/03/17 0530    . levETIRAcetam (KEPPRA) tablet 500 mg  500 mg Oral BID Karmen Bongo, MD   500 mg at 11/02/17 1834  . loratadine (CLARITIN) tablet 10 mg  10 mg Oral Ivery Quale, MD   10 mg at 11/02/17 2218  . MUSCLE RUB CREA   Topical TID Karmen Bongo, MD      . ondansetron [facility]) tablet 4 mg  4 mg Oral Q6H PRN Karmen Bongo, MD       Or  . ondansetron [facility]) injection 4 mg  4 mg Intravenous Q6H PRN Karmen Bongo, MD      . pantoprazole (PROTONIX) EC tablet 40 mg  40 mg Oral Daily Karmen Bongo, MD   40 mg at 11/02/17 1032  . polyethylene glycol (MIRALAX / GLYCOLAX) packet 17 g  17 g Oral Daily PRN Karmen Bongo, MD      . sertraline (ZOLOFT) tablet 50 mg  50 mg Oral Ivery Quale, MD   50 mg at 11/02/17 2219     Discharge Medications: Please see discharge summary for a list of discharge medications.  Relevant Imaging Results:  Relevant Lab Results:   Additional Information SS#[id number] [place], [place]

## 2017-11-05 DIAGNOSIS — W19XXXA Unspecified fall, initial encounter: Secondary | ICD-10-CM | POA: Diagnosis not present

## 2017-11-05 DIAGNOSIS — Z91128 Patient's intentional underdosing of medication regimen for other reason: Secondary | ICD-10-CM | POA: Diagnosis not present

## 2017-11-05 DIAGNOSIS — I10 Essential (primary) hypertension: Secondary | ICD-10-CM | POA: Diagnosis not present

## 2017-11-05 DIAGNOSIS — R0902 Hypoxemia: Secondary | ICD-10-CM | POA: Diagnosis not present

## 2017-11-06 ENCOUNTER — Emergency Department (HOSPITAL_COMMUNITY): Payer: Medicare HMO

## 2017-11-06 ENCOUNTER — Encounter (HOSPITAL_COMMUNITY): Payer: Self-pay | Admitting: Emergency Medicine

## 2017-11-06 ENCOUNTER — Emergency Department (HOSPITAL_COMMUNITY)
Admission: EM | Admit: 2017-11-06 | Discharge: 2017-11-06 | Disposition: A | Discharge status: Home / Self Care | Payer: Medicare HMO | Attending: Emergency Medicine | Admitting: Emergency Medicine

## 2017-11-06 ENCOUNTER — Other Ambulatory Visit: Payer: Self-pay

## 2017-11-06 DIAGNOSIS — S199XXA Unspecified injury of neck, initial encounter: Secondary | ICD-10-CM | POA: Diagnosis not present

## 2017-11-06 DIAGNOSIS — Z79899 Other long term (current) drug therapy: Secondary | ICD-10-CM | POA: Diagnosis not present

## 2017-11-06 DIAGNOSIS — Y999 Unspecified external cause status: Secondary | ICD-10-CM | POA: Insufficient documentation

## 2017-11-06 DIAGNOSIS — I110 Hypertensive heart disease with heart failure: Secondary | ICD-10-CM | POA: Diagnosis not present

## 2017-11-06 DIAGNOSIS — Y92129 Unspecified place in nursing home as the place of occurrence of the external cause: Secondary | ICD-10-CM | POA: Diagnosis not present

## 2017-11-06 DIAGNOSIS — M2550 Pain in unspecified joint: Secondary | ICD-10-CM | POA: Diagnosis not present

## 2017-11-06 DIAGNOSIS — S0083XA Contusion of other part of head, initial encounter: Secondary | ICD-10-CM | POA: Insufficient documentation

## 2017-11-06 DIAGNOSIS — I509 Heart failure, unspecified: Secondary | ICD-10-CM | POA: Diagnosis not present

## 2017-11-06 DIAGNOSIS — Y939 Activity, unspecified: Secondary | ICD-10-CM | POA: Insufficient documentation

## 2017-11-06 DIAGNOSIS — Z7901 Long term (current) use of anticoagulants: Secondary | ICD-10-CM | POA: Insufficient documentation

## 2017-11-06 DIAGNOSIS — Z8673 Personal history of transient ischemic attack (TIA), and cerebral infarction without residual deficits: Secondary | ICD-10-CM | POA: Diagnosis not present

## 2017-11-06 DIAGNOSIS — Z7401 Bed confinement status: Secondary | ICD-10-CM | POA: Diagnosis not present

## 2017-11-06 DIAGNOSIS — R69 Illness, unspecified: Secondary | ICD-10-CM | POA: Diagnosis not present

## 2017-11-06 DIAGNOSIS — W19XXXA Unspecified fall, initial encounter: Secondary | ICD-10-CM | POA: Diagnosis not present

## 2017-11-06 DIAGNOSIS — S069X9A Unspecified intracranial injury with loss of consciousness of unspecified duration, initial encounter: Secondary | ICD-10-CM | POA: Diagnosis not present

## 2017-11-06 DIAGNOSIS — S0990XA Unspecified injury of head, initial encounter: Secondary | ICD-10-CM | POA: Diagnosis present

## 2017-11-06 NOTE — ED Notes (Signed)
Attempted to call report-no answer 

## 2017-11-06 NOTE — ED Triage Notes (Addendum)
Patient arrived from [facility] post fall. Hematoma to right head and skin tear to left forearm. Alert to self only. Currently taking Eloquis.

## 2017-11-06 NOTE — ED Provider Notes (Signed)
[facility] EMERGENCY DEPARTMENT Provider Note   CSN: [id number] Arrival date & time: 11/06/17  0015     History   Chief Complaint Chief Complaint  Patient presents with  . Fall    HPI Virginia Meyer is a 84 y.o. female.  The history is provided by the nursing home and the EMS personnel. The history is limited by the condition of the patient.  Fall  This is a recurrent problem. The current episode started less than 1 hour ago. The problem occurs constantly. The problem has not changed since onset.Pertinent negatives include no chest pain, no abdominal pain, no headaches and no shortness of breath. Nothing aggravates the symptoms. Nothing relieves the symptoms. She has tried nothing for the symptoms. The treatment provided no relief.    Past Medical History:  Diagnosis Date  . Anxiety   . CHF (congestive heart failure) ([place])   . Fall   . GERD (gastroesophageal reflux disease)   . Heart failure ([place])   . HLD (hyperlipidemia)   . HTN (hypertension)   . Hypotension   . Major depressive disorder   . Neuropathy   . PVD (peripheral vascular disease) ([employer])   . Stroke [facility])    left sided deficit  . UTI (urinary tract infection)     Patient Active Problem List   Diagnosis Date Noted  . Acute renal failure (ARF) ([place]) 11/01/2017  . Acute metabolic encephalopathy 18/56/3149  . Essential hypertension 11/01/2017  . Hyperglycemia 11/01/2017  . Dementia 11/01/2017  . Seizures ([place]) 01/07/2017  . Transient alteration of awareness   . Labile blood pressure   . Hypertensive urgency 10/27/2016  . Decreased responsiveness   . Bradycardia 10/19/2016  . Hypotension 10/19/2016  . Skull lesion 10/19/2016  . History of cerebrovascular accident (CVA) with residual deficit 10/19/2016  . Personal history of atrial flutter 10/19/2016  . Thoracic aortic atherosclerosis ([place]) 10/19/2016  . Intracranial atherosclerosis 10/19/2016  . Frailty syndrome in geriatric patient  10/19/2016  . AKI (acute kidney injury) ([place]) 10/19/2016  . 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 Syncope, Recurrent 10/18/2016  . Carotid artery disease ([place]) 07/06/2016  . Gait abnormality 05/13/2016  . Cerebrovascular accident (CVA) due to thrombosis of right anterior cerebral artery ([place]) 05/13/2016  . History of heart block 05/07/2016  . Benign labile hypertension 08/21/2015  . Acute CVA (cerebrovascular accident) ([employer]) 08/04/2015  . Left hemiparesis ([place]) 08/04/2015  . Primary osteoarthritis of left knee 08/04/2015  . Esophageal reflux 08/04/2015  . Hyperlipidemia LDL goal <70 08/04/2015  . Paroxysmal atrial fibrillation ([place]) 08/04/2015  . PVD (peripheral vascular disease) ([place]) 08/04/2015  . Protein-calorie malnutrition ([place]) 08/04/2015    Past Surgical History:  Procedure Laterality Date  . ABDOMINAL HYSTERECTOMY    . FOOT NEUROMA SURGERY    . SPINE SURGERY    . TONSILLECTOMY       OB History   None      Home Medications    Prior to Admission medications   Medication Sig Start Date End Date Taking? Authorizing Provider  amiodarone (PACERONE) 100 MG tablet Take 1 tablet (100 mg total) daily by mouth. 01/09/17   Velna Ochs, MD  apixaban (ELIQUIS) 2.5 MG TABS tablet Take 1 tablet (2.5 mg total) by mouth 2 (two) times daily. 10/16/15   Rosalin Hawking, MD  capsaicin (ZOSTRIX) 0.025 % cream Apply 1 application at bedtime topically. APPLY BILATERAL TO FEET AT BEDTIME FOR PAIN    [provider]  docusate sodium (COLACE) 100 MG capsule Take 100 mg by mouth 2 (two) times daily.    [provider]  gabapentin (NEURONTIN) 100 MG capsule Take 1 capsule (100 mg total) by mouth 2 (two) times daily. 100 mg in the morning and 200 mg at bedtime Patient taking differently: Take 100 mg by mouth See admin instructions. Take 1 capsule (100mg ) in the morning. Then take 2 capsules (200mg ) at bedtime. 10/29/16   Nuala Alpha, DO    levETIRAcetam (KEPPRA) 500 MG tablet Take 1 tablet (500 mg total) 2 (two) times daily by mouth. 01/08/17   Velna Ochs, MD  levocetirizine (XYZAL) 5 MG tablet Take 5 mg by mouth at bedtime.    [provider]  losartan (COZAAR) 50 MG tablet Take 0.5 tablets (25 mg total) by mouth daily. 11/03/17   Geradine Girt, DO  meclizine (ANTIVERT) 12.5 MG tablet Take 12.5 mg by mouth every 8 (eight) hours as needed for dizziness. Not to exceed 3 doses/24 hours    [provider]  Melatonin 3 MG TABS Take 3 mg by mouth at bedtime.    [provider]  Menthol-Zinc Oxide (CALMOSEPTINE) 0.44-20.6 % OINT Apply 1 application topically 3 (three) times daily. Apply to buttocks for redness    [provider]  omeprazole (PRILOSEC) 20 MG capsule Take 20 mg by mouth daily as needed (GERD, indigestion, heartburn.).    [provider]  polyethylene glycol (MIRALAX / GLYCOLAX) packet Take 17 g by mouth daily as needed for mild constipation. To be mixed with 6 ounces of fluid    [provider]  Psyllium 500 MG CAPS Take 500 mg by mouth at bedtime.    [provider]  sertraline (ZOLOFT) 50 MG tablet Take 50 mg by mouth at bedtime.    [provider]    Family History Family History  Problem Relation Age of Onset  . Heart attack Brother   . Stroke Neg Hx   . Neuropathy Neg Hx     Social History Social History   Tobacco Use  . Smoking status: Never Smoker  . Smokeless tobacco: Never Used  Substance Use Topics  . Alcohol use: No    Alcohol/week: 0.0 standard drinks  . Drug use: No     Allergies   Patient has no known allergies.   Review of Systems Review of Systems  Unable to perform ROS: Dementia  Respiratory: Negative for shortness of breath.   Cardiovascular: Negative for chest pain.  Gastrointestinal: Negative for abdominal pain.  Neurological: Negative for headaches.     Physical Exam Updated Vital Signs BP (!)  191/55   Pulse 69   Temp (!) 97 F (36.1 C) (Oral)   Resp 16   Ht 5' (1.524 m)   Wt 54.1 kg   SpO2 97%   BMI 23.29 kg/m   Physical Exam  Constitutional: She appears well-developed and well-nourished. No distress.  HENT:  Head: Normocephalic and atraumatic.  Right Ear: External ear normal.  Left Ear: External ear normal.  Nose: Nose normal.  Mouth/Throat: Oropharynx is clear and moist. No oropharyngeal exudate.  Eyes: Pupils are equal, round, and reactive to light. Conjunctivae are normal.  Neck: Normal range of motion. Neck supple.  Cardiovascular: Normal rate, regular rhythm, normal heart sounds and intact distal pulses.  Pulmonary/Chest: Effort normal and breath sounds normal. No stridor. She has no wheezes. She has no rales.  Abdominal: Soft. Bowel sounds are normal. She exhibits no mass. There  is no tenderness. There is no rebound and no guarding.  Musculoskeletal: Normal range of motion.  Neurological: She is alert. She displays normal reflexes.  Skin: Skin is warm and dry. Capillary refill takes less than 2 seconds.  Psychiatric: She has a normal mood and affect.     ED Treatments / Results  Labs (all labs ordered are listed, but only abnormal results are displayed) Labs Reviewed - No data to display  EKG None  Radiology Ct Head Wo Contrast  Result Date: 11/06/2017 CLINICAL DATA:  [facility] at skilled nursing facility.  Unresponsive. EXAM: CT HEAD WITHOUT CONTRAST CT CERVICAL SPINE WITHOUT CONTRAST TECHNIQUE: Multidetector CT imaging of the head and cervical spine was performed following the standard protocol without intravenous contrast. Multiplanar CT image reconstructions of the cervical spine were also generated. COMPARISON:  CT HEAD November 01, 2017 FINDINGS: CT HEAD FINDINGS [place]: No intraparenchymal hemorrhage, mass effect nor midline shift. Large area RIGHT frontotemporal parietal encephalomalacia. Contiguous old RIGHT basal ganglia infarct with bilateral old  thalami lacunar infarcts. Moderate to severe parenchymal [place] volume loss. Ex vacuo dilatation RIGHT lateral ventricle. Old small cerebellar infarcts. Confluent supratentorial white matter hypodensities compatible with chronic small vessel ischemic changes. VASCULAR: Moderate calcific atherosclerosis of the carotid siphons. SKULL: No skull fracture. Osteopenia. Severe LEFT temporomandibular osteoarthrosis. No significant scalp soft tissue swelling. SINUSES/ORBITS: Trace paranasal sinus mucosal thickening. Mastoid air cells are well aerated.The included ocular globes and orbital contents are non-suspicious. OTHER: None. CT CERVICAL SPINE FINDINGS ALIGNMENT: Maintained lordosis. Vertebral bodies in alignment. SKULL BASE AND VERTEBRAE: Cervical vertebral bodies and posterior elements are intact. Severe C4-5 and C5-6 disc height loss, moderate C3-4 and C6-7 vacuum disc and endplate spurring consistent with degenerative discs. Multilevel moderate facet arthropathy, RIGHT C4-5 facets are fused on degenerative basis. Osteopenia. No destructive bony lesions. C1-2 articulation maintained. SOFT TISSUES AND SPINAL CANAL: Nonacute. Severe calcific atherosclerosis carotid bifurcations. Heterogeneous thyroid without dominant nodule. DISC LEVELS: Mild canal stenosis C5-6. Multilevel mild neural foraminal narrowing. UPPER CHEST: Lung apices are clear. OTHER: None. IMPRESSION: CT HEAD: 1. No acute intracranial process. 2. Stable examination including large old RIGHT MCA territory infarct and multiple old small supra and infratentorial infarcts. CT CERVICAL SPINE: 1. No fracture or malalignment. Aortic Atherosclerosis (ICD10-I70.0). Electronically Signed   By: Elon Alas M.D.   On: 11/06/2017 01:43   Ct Cervical Spine Wo Contrast  Result Date: 11/06/2017 CLINICAL DATA:  [facility] at skilled nursing facility.  Unresponsive. EXAM: CT HEAD WITHOUT CONTRAST CT CERVICAL SPINE WITHOUT CONTRAST TECHNIQUE: Multidetector CT imaging of  the head and cervical spine was performed following the standard protocol without intravenous contrast. Multiplanar CT image reconstructions of the cervical spine were also generated. COMPARISON:  CT HEAD November 01, 2017 FINDINGS: CT HEAD FINDINGS [place]: No intraparenchymal hemorrhage, mass effect nor midline shift. Large area RIGHT frontotemporal parietal encephalomalacia. Contiguous old RIGHT basal ganglia infarct with bilateral old thalami lacunar infarcts. Moderate to severe parenchymal [place] volume loss. Ex vacuo dilatation RIGHT lateral ventricle. Old small cerebellar infarcts. Confluent supratentorial white matter hypodensities compatible with chronic small vessel ischemic changes. VASCULAR: Moderate calcific atherosclerosis of the carotid siphons. SKULL: No skull fracture. Osteopenia. Severe LEFT temporomandibular osteoarthrosis. No significant scalp soft tissue swelling. SINUSES/ORBITS: Trace paranasal sinus mucosal thickening. Mastoid air cells are well aerated.The included ocular globes and orbital contents are non-suspicious. OTHER: None. CT CERVICAL SPINE FINDINGS ALIGNMENT: Maintained lordosis. Vertebral bodies in alignment. SKULL BASE AND VERTEBRAE: Cervical vertebral bodies and posterior elements are intact. Severe C4-5  and C5-6 disc height loss, moderate C3-4 and C6-7 vacuum disc and endplate spurring consistent with degenerative discs. Multilevel moderate facet arthropathy, RIGHT C4-5 facets are fused on degenerative basis. Osteopenia. No destructive bony lesions. C1-2 articulation maintained. SOFT TISSUES AND SPINAL CANAL: Nonacute. Severe calcific atherosclerosis carotid bifurcations. Heterogeneous thyroid without dominant nodule. DISC LEVELS: Mild canal stenosis C5-6. Multilevel mild neural foraminal narrowing. UPPER CHEST: Lung apices are clear. OTHER: None. IMPRESSION: CT HEAD: 1. No acute intracranial process. 2. Stable examination including large old RIGHT MCA territory infarct and  multiple old small supra and infratentorial infarcts. CT CERVICAL SPINE: 1. No fracture or malalignment. Aortic Atherosclerosis (ICD10-I70.0). Electronically Signed   By: Elon Alas M.D.   On: 11/06/2017 01:43    Procedures Procedures (including critical care time)  Medications Ordered in ED Medications - No data to display    Final Clinical Impressions(s) / ED Diagnoses    Return for fevers >100.4 unrelieved by medication, shortness of breath, intractable vomiting, or diarrhea, Inability to tolerate liquids or food, cough, altered mental status or any concerns. No signs of systemic illness or infection. The patient is nontoxic-appearing on exam and vital signs are within normal limits.   I have reviewed the triage vital signs and the nursing notes. Pertinent labs &imaging results that were available during my care of the patient were reviewed by me and considered in my medical decision making (see chart for details).  After history, exam, and medical workup I feel the patient has been appropriately medically screened and is safe for discharge home. Pertinent diagnoses were discussed with the patient. Patient was given return precautions.      Onofrio Klemp, MD 11/06/17 [id number]

## 2017-11-07 NOTE — Consult Note (Signed)
            [facility] CM Primary Care Navigator  11/07/2017  Virginia Meyer April 30, 1928 [id number]   Attempt to see patient at the bedside to identify possible discharge needs but she was already discharged to ALF- assisted living facility [facility]).  Per chart review, primary care provider listed is Dr. Reymundo Poll with [facility] which is not under [facility] network, therefore, [facility] will not follow.   For additional questions please contact:  Edwena Felty A. Lysa Livengood, BSN, RN-BC [facility] PRIMARY CARE Navigator Cell: [phone]

## 2017-11-08 DIAGNOSIS — I4891 Unspecified atrial fibrillation: Secondary | ICD-10-CM | POA: Diagnosis not present

## 2017-11-08 DIAGNOSIS — Z79899 Other long term (current) drug therapy: Secondary | ICD-10-CM | POA: Diagnosis not present

## 2017-11-08 DIAGNOSIS — G9009 Other idiopathic peripheral autonomic neuropathy: Secondary | ICD-10-CM | POA: Diagnosis not present

## 2017-11-08 DIAGNOSIS — R296 Repeated falls: Secondary | ICD-10-CM | POA: Diagnosis not present

## 2017-11-08 DIAGNOSIS — I509 Heart failure, unspecified: Secondary | ICD-10-CM | POA: Diagnosis not present

## 2017-11-15 DIAGNOSIS — R5381 Other malaise: Secondary | ICD-10-CM | POA: Diagnosis not present

## 2017-11-15 DIAGNOSIS — I69852 Hemiplegia and hemiparesis following other cerebrovascular disease affecting left dominant side: Secondary | ICD-10-CM | POA: Diagnosis not present

## 2017-11-15 DIAGNOSIS — Z79899 Other long term (current) drug therapy: Secondary | ICD-10-CM | POA: Diagnosis not present

## 2017-11-15 DIAGNOSIS — E860 Dehydration: Secondary | ICD-10-CM | POA: Diagnosis not present

## 2017-11-15 DIAGNOSIS — L299 Pruritus, unspecified: Secondary | ICD-10-CM | POA: Diagnosis not present

## 2017-11-20 DIAGNOSIS — I69852 Hemiplegia and hemiparesis following other cerebrovascular disease affecting left dominant side: Secondary | ICD-10-CM | POA: Diagnosis not present

## 2017-11-21 ENCOUNTER — Encounter (HOSPITAL_COMMUNITY): Payer: Self-pay | Admitting: *Deleted

## 2017-11-21 ENCOUNTER — Encounter (HOSPITAL_COMMUNITY): Payer: Self-pay | Admitting: Physician Assistant

## 2017-11-21 ENCOUNTER — Other Ambulatory Visit: Payer: Self-pay

## 2017-11-21 ENCOUNTER — Ambulatory Visit: Payer: Self-pay | Admitting: Ophthalmology

## 2017-11-21 NOTE — Progress Notes (Addendum)
Pt is a resident at [facility]. Spoke with Karena Addison, LPN for pre-op call. She states she was not aware that pt is having surgery. We reviewed pt's medical history. Pt has hx of A-fib and is on Eliquis. The nursing facility has not received any instructions for stopping or not stopping Eliquis. Karena Addison states pt is alert, but confused. Have called Dr. Dareen Piano Patel's office to request pre-op orders and also to find out about Eliquis. I spoke with Luellen Pucker at the office and she states that pt did not have to stop Eliquis prior to procedure. She states she will also have Dr. Posey Pronto put orders in Epic.   Have left a message for pt's daughter to call me back so I can give her time of arrival if she wants to meet pt here.   EKG - 11/06/17 Echo - 10/19/17   10:45 AM. Pt's daughter returned my call. She will be meeting pt here. I gave her pt's arrival time.

## 2017-11-21 NOTE — Progress Notes (Addendum)
Anesthesia Chart Review: SAME DAY WORKUP   Case:  [id number] Date/Time:  11/22/17 1345   Procedures:      LEFT EYE VITRECTOMY PARS PLANA APPROACH WITH ENDOLASER PANRENTIAL PHOTOCOAGULATION (Left Eye)     PHOTOCOAGULATION WITH ENDOLASER (Left Eye)   Anesthesia type:  Monitor Anesthesia Care   Pre-op diagnosis:  H43.12 Vitreous hemorrhage, left eye   Location:  [place] OR ROOM 08 / [place] OR   Surgeon:  Jalene Mullet, MD      DISCUSSION: 84 yo female never smoker. Pertinent hx includes CKD, HTN, Diastolic HF, Hx of CHB, Paroxysmal Afib on Eliquis, GERD, Stroke with left side hemiparesis, Seizures, HOH, Dementia, Depression, Anxiety.  Pt seen by Cardiology in 2018 during admissions for syncope. At that time she was in NSR. Echo showed EF 65-70%.  G1 DD. Dynamic left ventricular outflow tract obstruction. Mild AS, LA severely dilated. PA pressure 31 mmHg. At that time is was recommended continue Eliquis and low dose amiodarone, hydration to prevent increase in outflow tract obstruction. No further workup was advised at that time. PPM not recommended.   Seen at ED 10/22/17 for AMS, treated for UTI. Thenhospitalized 9/10-9/12/19 for acute metabolic encephalopathy in the setting of underlying dementia. Per discharge summary the pt is wheelchair bound at baseline but, according to staff at [facility], she is generally A+Ox3 with occasional confusion. She was brought to the ED having delusions/hallucinations and found to have AKI thought to be the cause. Condition improved with resolution of AKI. She was again seen in the ED 11/06/17 s/p fall. At that time is was documented that she was alert to self only and ROS could not be completed due to dementia.  Per Dr. Serita Grit office the pt does not need to stop Eliquis. PAT nurse Lilia Pro has also requested Dr. Posey Pronto enter orders in [place].  She will need DOS eval by anesthesia. Recent notes indicate possibly worsening dementia with multiple episodes of AMS.  Anticipate she can proceed as planned if medically appropriate on DOS.  VS: There were no vitals taken for this visit.  PROVIDERSReymundo Poll, MD is PCP   LABS: Most recent labs below: Hx of renal insufficiency baseline creatinine appears ~1.0-1.1. Recently elevated liver enzymes. Trending down: AST (U/L)  Date Value  11/03/2017 57 (H)  11/01/2017 105 (H)  10/22/2017 38   ALT (U/L)  Date Value  11/03/2017 48 (H)  11/01/2017 77 (H)  10/22/2017 20      Ref. Range 11/03/2017 04:30  COMPREHENSIVE METABOLIC PANEL Unknown Rpt (A)  Sodium Latest Ref Range: 135 - 145 mmol/L 140  Potassium Latest Ref Range: 3.5 - 5.1 mmol/L 3.8  Chloride Latest Ref Range: 98 - 111 mmol/L 112 (H)  CO2 Latest Ref Range: 22 - 32 mmol/L 20 (L)  Glucose Latest Ref Range: 70 - 99 mg/dL 87  BUN Latest Ref Range: 8 - 23 mg/dL 32 (H)  Creatinine Latest Ref Range: 0.44 - 1.00 mg/dL 1.15 (H)  Calcium Latest Ref Range: 8.9 - 10.3 mg/dL 9.0  Anion gap Latest Ref Range: 5 - 15  8  Alkaline Phosphatase Latest Ref Range: 38 - 126 U/L 65  Albumin Latest Ref Range: 3.5 - 5.0 g/dL 2.5 (L)  AST Latest Ref Range: 15 - 41 U/L 57 (H)  ALT Latest Ref Range: 0 - 44 U/L 48 (H)  Total Protein Latest Ref Range: 6.5 - 8.1 g/dL 4.9 (L)  Total Bilirubin Latest Ref Range: 0.3 - 1.2 mg/dL 0.6  GFR, Est  Non African American Latest Ref Range: >60 mL/min 42 (L)  GFR, Est African American Latest Ref Range: >60 mL/min 48 (L)  WBC Latest Ref Range: 4.0 - 10.5 K/uL 8.1  RBC Latest Ref Range: 3.87 - 5.11 MIL/uL 3.66 (L)  Hemoglobin Latest Ref Range: 12.0 - 15.0 g/dL 10.7 (L)  HCT Latest Ref Range: 36.0 - 46.0 % 33.5 (L)  MCV Latest Ref Range: 78.0 - 100.0 fL 91.5  MCH Latest Ref Range: 26.0 - 34.0 pg 29.2  MCHC Latest Ref Range: 30.0 - 36.0 g/dL 31.9  RDW Latest Ref Range: 11.5 - 15.5 % 14.1  Platelets Latest Ref Range: 150 - 400 K/uL 164     IMAGES: CHEST - 2 VIEW 10/22/2017  COMPARISON:  Prior chest x-ray  08/31/2017  FINDINGS: Low inspiratory volumes. Cardiac and mediastinal contours are unchanged. Atherosclerotic calcifications are present in the transverse aorta. No evidence of focal airspace consolidation, pulmonary edema, pleural effusion or pneumothorax. Stable chronic bronchitic changes. Degenerative changes present in the right glenohumeral joint suggesting chronic rotator cuff injury.  IMPRESSION: Stable chest x-ray without evidence of acute cardiopulmonary process.   EKG: 11/06/2017: Sinus rhythm 68bpm. Anteroseptal infarct, old  CV: Carotid US 10/13/2017: Right Carotid: Velocities in the right ICA are consistent with a 1-39% stenosis.        Non-hemodynamically significant plaque <50% noted in the CCA.  Left Carotid: Velocities in the left ICA are consistent with a 40-59% stenosis.       Non-hemodynamically significant plaque noted in the CCA.  Vertebrals: Right vertebral artery demonstrates antegrade flow. Left vertebral       artery was not visualized. Subclavians: Normal flow hemodynamics were seen in bilateral subclavian       arteries.  TTE 10/19/2017: Study Conclusions  - Left ventricle: The cavity size was normal. Wall thickness was   increased in a pattern of moderate LVH. Systolic function was   vigorous. The estimated ejection fraction was in the range of 65%   to 70%. There was dynamic obstruction. Mid cavitary gradient   3.53m/s, peak gradient 81mmHg. Wall motion was normal; there were   no regional wall motion abnormalities. Doppler parameters are   consistent with abnormal left ventricular relaxation (grade 1   diastolic dysfunction). - Aortic valve: Trileaflet; moderately thickened, moderately   calcified leaflets. There was mild stenosis. There was mild   regurgitation. Peak velocity (S): 279 cm/s. Mean gradient (S): 16   mm Hg. Valve area (VTI): 1.29 cm^2. Valve area (Vmax): 1.23 cm^2.   Valve area (Vmean): 1.34  cm^2. - Mitral valve: Moderately calcified annulus. There was trivial   regurgitation. - Left atrium: The atrium was severely dilated. - Pulmonary arteries: Systolic pressure was mildly increased. PA   peak pressure: 33 mm Hg (S).  Impressions:  - Compared to the prior study, there has been no significant   interval change.  Past Medical History:  Diagnosis Date  . Anxiety   . Atrial fibrillation ([place])   . CHF (congestive heart failure) ([place])   . Chronic kidney disease    Acute Kidney Injury - 11/01/17  . Fall   . GERD (gastroesophageal reflux disease)   . Heart failure ([place])   . HLD (hyperlipidemia)   . HTN (hypertension)   . Hypotension   . Major depressive disorder   . Neuropathy   . PVD (peripheral vascular disease) ([place])   . Seizures ([place])   . Stroke [facility])    left sided deficit  . UTI (urinary  tract infection)     Past Surgical History:  Procedure Laterality Date  . ABDOMINAL HYSTERECTOMY    . FOOT NEUROMA SURGERY    . SPINE SURGERY    . TONSILLECTOMY      MEDICATIONS: No current facility-administered medications for this encounter.    Marland Kitchen acetaminophen (TYLENOL) 500 MG tablet  . ALPRAZolam (XANAX) 0.25 MG tablet  . amiodarone (PACERONE) 100 MG tablet  . apixaban (ELIQUIS) 2.5 MG TABS tablet  . atorvastatin (LIPITOR) 20 MG tablet  . capsaicin (ZOSTRIX) 0.025 % cream  . docusate sodium (COLACE) 100 MG capsule  . gabapentin (NEURONTIN) 100 MG capsule  . levETIRAcetam (KEPPRA) 500 MG tablet  . levocetirizine (XYZAL) 5 MG tablet  . losartan (COZAAR) 50 MG tablet  . meclizine (ANTIVERT) 12.5 MG tablet  . Melatonin 3 MG TABS  . Menthol-Zinc Oxide (CALMOSEPTINE) 0.44-20.6 % OINT  . omeprazole (PRILOSEC) 20 MG capsule  . polyethylene glycol (MIRALAX / GLYCOLAX) packet  . pramoxine (SARNA SENSITIVE) 1 % LOTN  . Psyllium 500 MG CAPS  . sertraline (ZOLOFT) 50 MG tablet    Wynonia Musty [facility] Short Stay Center/Anesthesiology Phone [phone] 11/21/2017 10:31 AM

## 2017-11-21 NOTE — Pre-Procedure Instructions (Addendum)
   Camera Hergert  11/21/2017    Mrs. Augspurger's procedure is scheduled on Tuesday, 11/22/17 at 2:00 PM.   Report to [facility] Entrance "A" Admitting Office at 11:30 AM.   Call this number if you have problems the morning of surgery: [phone]   Remember:  Patient is not to eat or drink after midnight tonight.  Take these medicines the morning of surgery with A SIP OF WATER: Amiodarone (Pacerone), Gabapentin (Neurontin), Levetiracetam (Keppra), Tylenol, Omeprazole (Prilosec) - if needed, Alprazolam (Xanax) - if needed  Per Order of Dr. Howie Ill, RN    Do not wear jewelry, make-up or nail polish.  Do not wear lotions, powders, perfumes or deodorant.  Do not shave 48 hours prior to surgery.    Do not bring valuables to the hospital.  [facility] is not responsible for any belongings or valuables.  Contacts, dentures or bridgework may not be worn into surgery.  Leave your suitcase in the car.  After surgery it may be brought to your room.  For patients admitted to the hospital, discharge time will be determined by your treatment team.  If any questions today, please call me, Lilia Pro, RN at [phone].

## 2017-11-21 NOTE — H&P (Deleted)
  The note originally documented on this encounter has been moved the the encounter in which it belongs.  

## 2017-11-21 NOTE — H&P (Signed)
Date of examination:  11/117  Indication for surgery: vitreous hemorrhage left eye  Pertinent past medical history:  Past Medical History:  Diagnosis Date  . Anxiety   . Atrial fibrillation ([place])   . CHF (congestive heart failure) ([place])   . Chronic kidney disease    Acute Kidney Injury - 11/01/17  . Fall   . GERD (gastroesophageal reflux disease)   . Heart failure ([place])   . HLD (hyperlipidemia)   . HTN (hypertension)   . Hypotension   . Major depressive disorder   . Neuropathy   . PVD (peripheral vascular disease) ([place])   . Seizures ([place])   . Stroke [facility])    left sided deficit  . UTI (urinary tract infection)     Pertinent ocular history:  Non-clearing vitreous hemorrhage left eye  Pertinent family history:  Family History  Problem Relation Age of Onset  . Heart attack Brother   . Stroke Neg Hx   . Neuropathy Neg Hx     General:  Healthy appearing patient in no distress.    Eyes:    Acuity   OS CF    External: Within normal limits     Anterior segment: Within normal limits     Fundus: no view OS - B-scan retina attached.     Impression: vitreous hemorrhage left eye  Plan: Pars plana vitrectomy with endolaser left eye  Virginia Meyer

## 2017-11-22 ENCOUNTER — Emergency Department (HOSPITAL_COMMUNITY)
Admission: RE | Admit: 2017-11-22 | Discharge: 2017-11-22 | Disposition: A | Discharge status: Home / Self Care | Payer: Medicare HMO | Source: Ambulatory Visit | Attending: Emergency Medicine | Admitting: Emergency Medicine

## 2017-11-22 ENCOUNTER — Encounter (HOSPITAL_COMMUNITY): Payer: Self-pay | Admitting: Emergency Medicine

## 2017-11-22 ENCOUNTER — Encounter (HOSPITAL_COMMUNITY)
Admission: RE | Disposition: A | Discharge status: Home / Self Care | Payer: Self-pay | Source: Ambulatory Visit | Attending: Emergency Medicine

## 2017-11-22 DIAGNOSIS — I159 Secondary hypertension, unspecified: Secondary | ICD-10-CM | POA: Diagnosis not present

## 2017-11-22 DIAGNOSIS — I509 Heart failure, unspecified: Secondary | ICD-10-CM | POA: Diagnosis not present

## 2017-11-22 DIAGNOSIS — F419 Anxiety disorder, unspecified: Secondary | ICD-10-CM | POA: Diagnosis not present

## 2017-11-22 DIAGNOSIS — R69 Illness, unspecified: Secondary | ICD-10-CM | POA: Diagnosis not present

## 2017-11-22 DIAGNOSIS — N189 Chronic kidney disease, unspecified: Secondary | ICD-10-CM | POA: Insufficient documentation

## 2017-11-22 DIAGNOSIS — Z79899 Other long term (current) drug therapy: Secondary | ICD-10-CM | POA: Insufficient documentation

## 2017-11-22 DIAGNOSIS — I130 Hypertensive heart and chronic kidney disease with heart failure and stage 1 through stage 4 chronic kidney disease, or unspecified chronic kidney disease: Secondary | ICD-10-CM | POA: Insufficient documentation

## 2017-11-22 HISTORY — DX: Unspecified convulsions: R56.9

## 2017-11-22 HISTORY — DX: Chronic kidney disease, unspecified: N18.9

## 2017-11-22 HISTORY — DX: Unspecified atrial fibrillation: I48.91

## 2017-11-22 SURGERY — VITRECTOMY, PARS PLANA APPROACH, WITH IOL INSERTION OR REPLACEMENT
Anesthesia: Monitor Anesthesia Care | Site: Eye | Laterality: Left

## 2017-11-22 MED ORDER — TROPICAMIDE 1 % OP SOLN: 1.0000 [drp] | OPHTHALMIC | Status: DC | PRN

## 2017-11-22 MED ORDER — ROCURONIUM BROMIDE 50 MG/5ML IV SOSY
PREFILLED_SYRINGE | INTRAVENOUS | Status: AC
  Filled 2017-11-22: qty 5

## 2017-11-22 MED ORDER — OFLOXACIN 0.3 % OP SOLN: 1.0000 [drp] | OPHTHALMIC | Status: DC | PRN

## 2017-11-22 MED ORDER — CEFAZOLIN SUBCONJUNCTIVAL INJECTION 100 MG/0.5 ML
200.0000 mg | INJECTION | SUBCONJUNCTIVAL | Status: DC
  Filled 2017-11-22: qty 5

## 2017-11-22 MED ORDER — LIDOCAINE 2% (20 MG/ML) 5 ML SYRINGE
INTRAMUSCULAR | Status: AC
  Filled 2017-11-22: qty 5

## 2017-11-22 MED ORDER — PHENYLEPHRINE HCL 10 % OP SOLN: 1.0000 [drp] | OPHTHALMIC | Status: DC | PRN

## 2017-11-22 MED ORDER — ONDANSETRON HCL 4 MG/2ML IJ SOLN
INTRAMUSCULAR | Status: AC
  Filled 2017-11-22: qty 2

## 2017-11-22 MED ORDER — PHENYLEPHRINE 40 MCG/ML (10ML) SYRINGE FOR IV PUSH (FOR BLOOD PRESSURE SUPPORT)
PREFILLED_SYRINGE | INTRAVENOUS | Status: AC
  Filled 2017-11-22: qty 10

## 2017-11-22 MED ORDER — FENTANYL CITRATE (PF) 250 MCG/5ML IJ SOLN
INTRAMUSCULAR | Status: AC
  Filled 2017-11-22: qty 5

## 2017-11-22 MED ORDER — EPHEDRINE 5 MG/ML INJ
INTRAVENOUS | Status: AC
  Filled 2017-11-22: qty 10

## 2017-11-22 MED ORDER — SODIUM CHLORIDE 0.9 % IJ SOLN
INTRAMUSCULAR | Status: AC
  Filled 2017-11-22: qty 10

## 2017-11-22 MED ORDER — ARTIFICIAL TEARS OPHTHALMIC OINT
TOPICAL_OINTMENT | OPHTHALMIC | Status: AC
  Filled 2017-11-22: qty 3.5

## 2017-11-22 MED ORDER — SUCCINYLCHOLINE CHLORIDE 200 MG/10ML IV SOSY
PREFILLED_SYRINGE | INTRAVENOUS | Status: AC
  Filled 2017-11-22: qty 10

## 2017-11-22 MED ORDER — CYCLOPENTOLATE HCL 1 % OP SOLN: 1.0000 [drp] | OPHTHALMIC | Status: DC | PRN

## 2017-11-22 MED ORDER — PROPOFOL 10 MG/ML IV BOLUS
INTRAVENOUS | Status: AC
  Filled 2017-11-22: qty 20

## 2017-11-22 MED ORDER — GLYCOPYRROLATE PF 0.2 MG/ML IJ SOSY
PREFILLED_SYRINGE | INTRAMUSCULAR | Status: AC
  Filled 2017-11-22: qty 1

## 2017-11-22 MED ORDER — NEOSTIGMINE METHYLSULFATE 3 MG/3ML IV SOSY
PREFILLED_SYRINGE | INTRAVENOUS | Status: AC
  Filled 2017-11-22: qty 3

## 2017-11-22 MED ORDER — SODIUM CHLORIDE 0.9 % IV SOLN: INTRAVENOUS | Status: DC

## 2017-11-22 MED ORDER — DEXAMETHASONE SODIUM PHOSPHATE 10 MG/ML IJ SOLN
INTRAMUSCULAR | Status: AC
  Filled 2017-11-22: qty 1

## 2017-11-22 NOTE — ED Notes (Signed)
Verified with MD that no new orders needed and that pt was heading back to facility.

## 2017-11-22 NOTE — ED Provider Notes (Signed)
[facility] EMERGENCY DEPARTMENT Provider Note   CSN: [id number] Arrival date & time: 11/22/17  1253     History   Chief Complaint Chief Complaint  Patient presents with  . Hypertension    HPI Virginia Meyer is a 84 y.o. female.  Patient with history of hypertension, atrial fibrillation on Eliquis who presents to the ED after being found to have hypertension while in preop.  Patient has taken her morning hypertension medicines.  Family member states that her blood pressure is very labile and will be very high at times and very low at times.  Patient does not have any symptoms.  No chest pain.  No new neurological symptoms.  Has left-sided weakness at baseline.  Was to undergo surgery on her left eye today.  Patient is overall well-appearing.  Manual blood pressure upon arrival is 148/58.  The history is provided by the patient and a caregiver.  Hypertension  This is a chronic problem. The current episode started more than 1 week ago. The problem occurs daily. The problem has not changed since onset.Pertinent negatives include no chest pain, no abdominal pain, no headaches and no shortness of breath. Nothing aggravates the symptoms. Nothing relieves the symptoms. She has tried nothing for the symptoms. The treatment provided no relief.    Past Medical History:  Diagnosis Date  . Anxiety   . Atrial fibrillation ([place])   . CHF (congestive heart failure) ([place])   . Chronic kidney disease    Acute Kidney Injury - 11/01/17  . Fall   . GERD (gastroesophageal reflux disease)   . Heart failure ([place])   . HLD (hyperlipidemia)   . HTN (hypertension)   . Hypotension   . Major depressive disorder   . Neuropathy   . PVD (peripheral vascular disease) ([place])   . Seizures ([place])   . Stroke [facility])    left sided deficit  . UTI (urinary tract infection)     Patient Active Problem List   Diagnosis Date Noted  . Acute renal failure (ARF) ([place]) 11/01/2017  . Acute metabolic  encephalopathy 95/28/4132  . Essential hypertension 11/01/2017  . Hyperglycemia 11/01/2017  . Dementia ([place]) 11/01/2017  . Seizures ([place]) 01/07/2017  . Transient alteration of awareness   . Labile blood pressure   . Hypertensive urgency 10/27/2016  . Decreased responsiveness   . Bradycardia 10/19/2016  . Hypotension 10/19/2016  . Skull lesion 10/19/2016  . History of cerebrovascular accident (CVA) with residual deficit 10/19/2016  . Personal history of atrial flutter 10/19/2016  . Thoracic aortic atherosclerosis ([place]) 10/19/2016  . Intracranial atherosclerosis 10/19/2016  . Frailty syndrome in geriatric patient 10/19/2016  . AKI (acute kidney injury) ([place]) 10/19/2016  . 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 Syncope, Recurrent 10/18/2016  . Carotid artery disease ([place]) 07/06/2016  . Gait abnormality 05/13/2016  . Cerebrovascular accident (CVA) due to thrombosis of right anterior cerebral artery ([place]) 05/13/2016  . History of heart block 05/07/2016  . Benign labile hypertension 08/21/2015  . Acute CVA (cerebrovascular accident) ([place]) 08/04/2015  . Left hemiparesis ([place]) 08/04/2015  . Primary osteoarthritis of left knee 08/04/2015  . Esophageal reflux 08/04/2015  . Hyperlipidemia LDL goal <70 08/04/2015  . Paroxysmal atrial fibrillation ([place]) 08/04/2015  . PVD (peripheral vascular disease) ([place]) 08/04/2015  . Protein-calorie malnutrition ([place]) 08/04/2015    Past Surgical History:  Procedure Laterality Date  . ABDOMINAL HYSTERECTOMY    . FOOT NEUROMA SURGERY    . SPINE SURGERY    .  TONSILLECTOMY       OB History   None      Home Medications    Prior to Admission medications   Medication Sig Start Date End Date Taking? Authorizing Provider  acetaminophen (TYLENOL) 500 MG tablet Take 500 mg by mouth 3 (three) times daily. (0900, 1600, 2100)   Yes [provider]  amiodarone (PACERONE) 100 MG tablet Take 1 tablet (100 mg total)  daily by mouth. Patient taking differently: Take 100 mg by mouth daily. (1000) 01/09/17  Yes Velna Ochs, MD  apixaban (ELIQUIS) 2.5 MG TABS tablet Take 1 tablet (2.5 mg total) by mouth 2 (two) times daily. Patient taking differently: Take 2.5 mg by mouth 2 (two) times daily. (1000 & 2100) 10/16/15  Yes Rosalin Hawking, MD  atorvastatin (LIPITOR) 20 MG tablet Take 20 mg by mouth daily at 8 pm. (2100)   Yes [provider]  capsaicin (ZOSTRIX) 0.025 % cream Apply 1 application topically at bedtime. (2100) APPLY BILATERAL TO FEET AT BEDTIME FOR PAIN   Yes [provider]  docusate sodium (COLACE) 100 MG capsule Take 100 mg by mouth 2 (two) times daily. (1000 & 2100)   Yes [provider]  gabapentin (NEURONTIN) 100 MG capsule Take 1 capsule (100 mg total) by mouth 2 (two) times daily. 100 mg in the morning and 200 mg at bedtime Patient taking differently: Take 100-200 mg by mouth See admin instructions. Take 1 capsule (100mg ) in the morning. Then take 2 capsules (200mg ) at bedtime.(1000 & 2100) 10/29/16  Yes Lockamy, Timothy, DO  levETIRAcetam (KEPPRA) 500 MG tablet Take 1 tablet (500 mg total) 2 (two) times daily by mouth. Patient taking differently: Take 500 mg by mouth 2 (two) times daily. (1000 & 2100) 01/08/17  Yes Velna Ochs, MD  levocetirizine (XYZAL) 5 MG tablet Take 5 mg by mouth at bedtime. (2100)   Yes [provider]  losartan (COZAAR) 50 MG tablet Take 0.5 tablets (25 mg total) by mouth daily. Patient taking differently: Take 25 mg by mouth daily. (1000) 11/03/17  Yes Vann, Jessica U, DO  Melatonin 3 MG TABS Take 3 mg by mouth at bedtime. (2100)   Yes [provider]  Menthol-Zinc Oxide (CALMOSEPTINE) 0.44-20.6 % OINT Apply 1 application topically 3 (three) times daily. Apply to buttocks for redness   Yes [provider]  pramoxine (SARNA SENSITIVE) 1 % LOTN Apply 1 application topically 2 (two) times daily. (0900 & 2100)   Yes  [provider]  Psyllium 500 MG CAPS Take 500 mg by mouth at bedtime. (2100)   Yes [provider]  sertraline (ZOLOFT) 50 MG tablet Take 50 mg by mouth at bedtime. (2100)   Yes [provider]  ALPRAZolam (XANAX) 0.25 MG tablet Take 0.25 mg by mouth every 12 (twelve) hours as needed (ANXIETY/AGITATION.).    [provider]  meclizine (ANTIVERT) 12.5 MG tablet Take 12.5 mg by mouth every 8 (eight) hours as needed for dizziness. Not to exceed 3 doses/24 hours    [provider]  omeprazole (PRILOSEC) 20 MG capsule Take 20 mg by mouth daily as needed (GERD, indigestion, heartburn.).    [provider]  polyethylene glycol (MIRALAX / GLYCOLAX) packet Take 17 g by mouth daily as needed for mild constipation. To be mixed with 6 ounces of fluid    [provider]    Family History Family History  Problem Relation Age of Onset  . Heart attack Brother   . Stroke  Neg Hx   . Neuropathy Neg Hx     Social History Social History   Tobacco Use  . Smoking status: Never Smoker  . Smokeless tobacco: Never Used  Substance Use Topics  . Alcohol use: No    Alcohol/week: 0.0 standard drinks  . Drug use: No     Allergies   Patient has no known allergies.   Review of Systems Review of Systems  Constitutional: Negative for chills and fever.  HENT: Negative for ear pain and sore throat.   Eyes: Negative for pain and visual disturbance.  Respiratory: Negative for cough and shortness of breath.   Cardiovascular: Negative for chest pain and palpitations.  Gastrointestinal: Negative for abdominal pain and vomiting.  Genitourinary: Negative for dysuria and hematuria.  Musculoskeletal: Negative for arthralgias and back pain.  Skin: Negative for color change and rash.  Neurological: Negative for seizures, syncope and headaches.  All other systems reviewed and are negative.    Physical Exam Updated Vital Signs  ED Triage Vitals  Enc  Vitals Group     BP 11/22/17 1157 (!) 263/72     Pulse Rate 11/22/17 1157 68     Resp 11/22/17 1157 20     Temp 11/22/17 1157 97.8 F (36.6 C)     Temp Source 11/22/17 1157 Oral     SpO2 11/22/17 1157 98 %     Weight 11/22/17 1157 120 lb (54.4 kg)     Height 11/22/17 1157 5\' 1"  (1.549 m)     Head Circumference --      Peak Flow --      Pain Score 11/22/17 1313 0     Pain Loc --      Pain Edu? --      Excl. in [place]? --     Physical Exam  Constitutional: She is oriented to person, place, and time. She appears well-developed and well-nourished. No distress.  HENT:  Head: Normocephalic and atraumatic.  Eyes: Conjunctivae and EOM are normal.  Neck: Normal range of motion. Neck supple.  Cardiovascular: Normal rate, regular rhythm, normal heart sounds and intact distal pulses.  No murmur heard. Pulmonary/Chest: Effort normal and breath sounds normal. No respiratory distress.  Abdominal: Soft. There is no tenderness.  Musculoskeletal: She exhibits no edema or tenderness.  Neurological: She is alert and oriented to person, place, and time.  5+ out of 5 strength on the right side, 1+ out of 5 strength in the left side, normal sensation, cranial nerves appear intact, patient is alert and pleasant  Skin: Skin is warm and dry.  Psychiatric: She has a normal mood and affect.  Nursing note and vitals reviewed.    ED Treatments / Results  Labs (all labs ordered are listed, but only abnormal results are displayed) Labs Reviewed - No data to display  EKG None  Radiology No results found.  Procedures Procedures (including critical care time)  Medications Ordered in ED Medications - No data to display   Initial Impression / Assessment and Plan / ED Course  I have reviewed the triage vital signs and the nursing notes.  Pertinent labs & imaging results that were available during my care of the patient were reviewed by me and considered in my medical decision making (see chart for  details).     Virginia Meyer is an 84 year old female history of hypertension, atrial fibrillation on Eliquis who presents to the ED with hypertension.  Patient with manual blood pressure upon arrival 148/58.  She  appears to tense up with manual cuff which may be affecting blood pressure readings.  Patient has taken her morning blood pressure medications.  She was in preop today for left eye surgery and found to have elevated blood pressure.  Family member at the bedside states that this is normal for the patient to have very labile blood pressure.  Patient is asymptomatic.  No chest pain.  No new stroke symptoms.  Has left-sided weakness at baseline but otherwise is neurologically intact.  She is alert and pleasant.  Family given reassurance.  No concern for stroke or ACS at this time.  Recommend that facility checks her blood pressure twice a day for the next 2 weeks and follow-up with the primary care doctor for any adjustments in blood pressure medications.  Recommend return to the ED if she develops severe chest pain, new strokelike symptoms.  Discharged from ED in good condition and understands return precautions and follow-up.  This chart was dictated using voice recognition software.  Despite best efforts to proofread,  errors can occur which can change the documentation meaning.   Final Clinical Impressions(s) / ED Diagnoses   Final diagnoses:  Secondary hypertension    ED Discharge Orders    None       Lennice Sites, DO 11/22/17 1414

## 2017-11-22 NOTE — Discharge Instructions (Addendum)
Please check the patient's blood pressure twice a day and record it.  Patient should be sent to the ED if she has severe chest pain or new strokelike symptoms.  Otherwise if she is asymptomatic and with high blood pressure please observe her.  She needs to follow-up with her primary care doctor and have those blood pressure readings with the primary care doctor to adjust long-term medications.

## 2017-11-22 NOTE — Anesthesia Preprocedure Evaluation (Deleted)
Anesthesia Evaluation  Patient identified by MRN, date of birth, ID band Patient awake    Reviewed: Allergy & Precautions, H&P , NPO status , Patient's Chart, lab work & pertinent test results  Airway Mallampati: II   Neck ROM: full    Dental   Pulmonary neg pulmonary ROS,    breath sounds clear to auscultation       Cardiovascular hypertension, + Peripheral Vascular Disease and +CHF  + dysrhythmias Atrial Fibrillation  Rhythm:regular Rate:Normal     Neuro/Psych PSYCHIATRIC DISORDERS Anxiety Depression Dementia CVA, Residual Symptoms    GI/Hepatic GERD  ,  Endo/Other    Renal/GU      Musculoskeletal  (+) Arthritis ,   Abdominal   Peds  Hematology   Anesthesia Other Findings   Reproductive/Obstetrics                             Anesthesia Physical Anesthesia Plan  ASA: III  Anesthesia Plan: MAC   Post-op Pain Management:    Induction: Intravenous  PONV Risk Score and Plan: 2 and Ondansetron and Treatment may vary due to age or medical condition  Airway Management Planned: Nasal Cannula  Additional Equipment:   Intra-op Plan:   Post-operative Plan:   Informed Consent: I have reviewed the patients History and Physical, chart, labs and discussed the procedure including the risks, benefits and alternatives for the proposed anesthesia with the patient or authorized representative who has indicated his/her understanding and acceptance.     Plan Discussed with: CRNA, Anesthesiologist and Surgeon  Anesthesia Plan Comments:         Anesthesia Quick Evaluation

## 2017-11-22 NOTE — ED Notes (Signed)
Pt's L pupil was noticed to be larger than R pupil by this tech. MD notified, attributed to prior hx of vision issue.

## 2017-11-22 NOTE — ED Notes (Signed)
Pt is being picked up in circle by [facility].

## 2017-11-22 NOTE — Progress Notes (Signed)
Patient's initial BP per NT was 263/72. RN student rechecked by BP 186/92. I manually rechecked BP 242/86 and 232/84. Patient alert and oriented x 4. Skin warm and dry. Dr. Cherene Altes and Dr. Posey Pronto notified

## 2017-11-24 DIAGNOSIS — R69 Illness, unspecified: Secondary | ICD-10-CM | POA: Diagnosis not present

## 2017-11-24 DIAGNOSIS — H348192 Central retinal vein occlusion, unspecified eye, stable: Secondary | ICD-10-CM | POA: Diagnosis not present

## 2017-11-24 DIAGNOSIS — I110 Hypertensive heart disease with heart failure: Secondary | ICD-10-CM | POA: Diagnosis not present

## 2017-11-24 DIAGNOSIS — I509 Heart failure, unspecified: Secondary | ICD-10-CM | POA: Diagnosis not present

## 2017-11-24 DIAGNOSIS — I480 Paroxysmal atrial fibrillation: Secondary | ICD-10-CM | POA: Diagnosis not present

## 2017-11-24 DIAGNOSIS — F329 Major depressive disorder, single episode, unspecified: Secondary | ICD-10-CM | POA: Diagnosis not present

## 2017-11-24 DIAGNOSIS — I739 Peripheral vascular disease, unspecified: Secondary | ICD-10-CM | POA: Diagnosis not present

## 2017-11-26 ENCOUNTER — Emergency Department (HOSPITAL_COMMUNITY)
Admission: EM | Admit: 2017-11-26 | Discharge: 2017-11-27 | Disposition: A | Discharge status: Home / Self Care | Payer: Medicare HMO | Attending: Emergency Medicine | Admitting: Emergency Medicine

## 2017-11-26 DIAGNOSIS — S161XXA Strain of muscle, fascia and tendon at neck level, initial encounter: Secondary | ICD-10-CM

## 2017-11-26 DIAGNOSIS — W19XXXA Unspecified fall, initial encounter: Secondary | ICD-10-CM

## 2017-11-26 DIAGNOSIS — Z8673 Personal history of transient ischemic attack (TIA), and cerebral infarction without residual deficits: Secondary | ICD-10-CM | POA: Insufficient documentation

## 2017-11-26 DIAGNOSIS — Y999 Unspecified external cause status: Secondary | ICD-10-CM | POA: Insufficient documentation

## 2017-11-26 DIAGNOSIS — Y9389 Activity, other specified: Secondary | ICD-10-CM | POA: Insufficient documentation

## 2017-11-26 DIAGNOSIS — W06XXXA Fall from bed, initial encounter: Secondary | ICD-10-CM | POA: Insufficient documentation

## 2017-11-26 DIAGNOSIS — S098XXA Other specified injuries of head, initial encounter: Secondary | ICD-10-CM | POA: Insufficient documentation

## 2017-11-26 DIAGNOSIS — R42 Dizziness and giddiness: Secondary | ICD-10-CM | POA: Diagnosis not present

## 2017-11-26 DIAGNOSIS — R69 Illness, unspecified: Secondary | ICD-10-CM | POA: Diagnosis not present

## 2017-11-26 DIAGNOSIS — R51 Headache: Secondary | ICD-10-CM | POA: Diagnosis not present

## 2017-11-26 DIAGNOSIS — Y92122 Bedroom in nursing home as the place of occurrence of the external cause: Secondary | ICD-10-CM | POA: Diagnosis not present

## 2017-11-26 DIAGNOSIS — I2510 Atherosclerotic heart disease of native coronary artery without angina pectoris: Secondary | ICD-10-CM | POA: Diagnosis not present

## 2017-11-26 DIAGNOSIS — F0390 Unspecified dementia without behavioral disturbance: Secondary | ICD-10-CM | POA: Diagnosis not present

## 2017-11-26 DIAGNOSIS — S0990XA Unspecified injury of head, initial encounter: Secondary | ICD-10-CM

## 2017-11-26 DIAGNOSIS — I509 Heart failure, unspecified: Secondary | ICD-10-CM | POA: Diagnosis not present

## 2017-11-26 DIAGNOSIS — Z79899 Other long term (current) drug therapy: Secondary | ICD-10-CM | POA: Diagnosis not present

## 2017-11-26 DIAGNOSIS — Z7901 Long term (current) use of anticoagulants: Secondary | ICD-10-CM | POA: Insufficient documentation

## 2017-11-26 DIAGNOSIS — I110 Hypertensive heart disease with heart failure: Secondary | ICD-10-CM | POA: Diagnosis not present

## 2017-11-26 DIAGNOSIS — R0902 Hypoxemia: Secondary | ICD-10-CM | POA: Diagnosis not present

## 2017-11-26 DIAGNOSIS — I10 Essential (primary) hypertension: Secondary | ICD-10-CM | POA: Diagnosis not present

## 2017-11-26 DIAGNOSIS — S199XXA Unspecified injury of neck, initial encounter: Secondary | ICD-10-CM | POA: Diagnosis not present

## 2017-11-27 ENCOUNTER — Emergency Department (HOSPITAL_COMMUNITY): Payer: Medicare HMO

## 2017-11-27 ENCOUNTER — Encounter (HOSPITAL_COMMUNITY): Payer: Self-pay | Admitting: Emergency Medicine

## 2017-11-27 DIAGNOSIS — M2550 Pain in unspecified joint: Secondary | ICD-10-CM | POA: Diagnosis not present

## 2017-11-27 DIAGNOSIS — I509 Heart failure, unspecified: Secondary | ICD-10-CM | POA: Diagnosis not present

## 2017-11-27 DIAGNOSIS — Z7901 Long term (current) use of anticoagulants: Secondary | ICD-10-CM | POA: Diagnosis not present

## 2017-11-27 DIAGNOSIS — R69 Illness, unspecified: Secondary | ICD-10-CM | POA: Diagnosis not present

## 2017-11-27 DIAGNOSIS — I2510 Atherosclerotic heart disease of native coronary artery without angina pectoris: Secondary | ICD-10-CM | POA: Diagnosis not present

## 2017-11-27 DIAGNOSIS — I110 Hypertensive heart disease with heart failure: Secondary | ICD-10-CM | POA: Diagnosis not present

## 2017-11-27 DIAGNOSIS — S098XXA Other specified injuries of head, initial encounter: Secondary | ICD-10-CM | POA: Diagnosis not present

## 2017-11-27 DIAGNOSIS — S199XXA Unspecified injury of neck, initial encounter: Secondary | ICD-10-CM | POA: Diagnosis not present

## 2017-11-27 DIAGNOSIS — W06XXXA Fall from bed, initial encounter: Secondary | ICD-10-CM | POA: Diagnosis not present

## 2017-11-27 DIAGNOSIS — Z8673 Personal history of transient ischemic attack (TIA), and cerebral infarction without residual deficits: Secondary | ICD-10-CM | POA: Diagnosis not present

## 2017-11-27 DIAGNOSIS — S0990XA Unspecified injury of head, initial encounter: Secondary | ICD-10-CM | POA: Diagnosis not present

## 2017-11-27 DIAGNOSIS — Z79899 Other long term (current) drug therapy: Secondary | ICD-10-CM | POA: Diagnosis not present

## 2017-11-27 DIAGNOSIS — R51 Headache: Secondary | ICD-10-CM | POA: Diagnosis not present

## 2017-11-27 DIAGNOSIS — Z7401 Bed confinement status: Secondary | ICD-10-CM | POA: Diagnosis not present

## 2017-11-27 NOTE — ED Provider Notes (Signed)
[facility] EMERGENCY DEPARTMENT Provider Note   CSN: [id number] Arrival date & time: 11/26/17  2357     History   Chief Complaint Chief Complaint  Patient presents with  . Fall    HPI Virginia Meyer Daily is a 84 y.o. female.  Patient is an 84 year old female with past medical history of CHF, A. fib, anxiety, depression, and dementia.  She is brought from her nursing home for evaluation of fall.  I am told she rolled out of bed this evening and hit her head on the nightstand.  There was unknown loss of consciousness.  The patient adds little additional history secondary to dementia.  She does not recall the fall.   The history is provided by the patient.  Fall  This is a new problem. The current episode started less than 1 hour ago. The problem occurs constantly. The problem has not changed since onset.Associated symptoms include headaches. Nothing aggravates the symptoms. Nothing relieves the symptoms. She has tried nothing for the symptoms.    Past Medical History:  Diagnosis Date  . Anxiety   . Atrial fibrillation ([place])   . CHF (congestive heart failure) ([place])   . Chronic kidney disease    Acute Kidney Injury - 11/01/17  . Fall   . GERD (gastroesophageal reflux disease)   . Heart failure ([place])   . HLD (hyperlipidemia)   . HTN (hypertension)   . Hypotension   . Major depressive disorder   . Neuropathy   . PVD (peripheral vascular disease) ([place])   . Seizures ([place])   . Stroke [facility])    left sided deficit  . UTI (urinary tract infection)     Patient Active Problem List   Diagnosis Date Noted  . Acute renal failure (ARF) ([place]) 11/01/2017  . Acute metabolic encephalopathy 02/54/2706  . Essential hypertension 11/01/2017  . Hyperglycemia 11/01/2017  . Dementia ([place]) 11/01/2017  . Seizures ([place]) 01/07/2017  . Transient alteration of awareness   . Labile blood pressure   . Hypertensive urgency 10/27/2016  . Decreased responsiveness   . Bradycardia  10/19/2016  . Hypotension 10/19/2016  . Skull lesion 10/19/2016  . History of cerebrovascular accident (CVA) with residual deficit 10/19/2016  . Personal history of atrial flutter 10/19/2016  . Thoracic aortic atherosclerosis ([place]) 10/19/2016  . Intracranial atherosclerosis 10/19/2016  . Frailty syndrome in geriatric patient 10/19/2016  . AKI (acute kidney injury) ([place]) 10/19/2016  . 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 Syncope, Recurrent 10/18/2016  . Carotid artery disease ([place]) 07/06/2016  . Gait abnormality 05/13/2016  . Cerebrovascular accident (CVA) due to thrombosis of right anterior cerebral artery ([place]) 05/13/2016  . History of heart block 05/07/2016  . Benign labile hypertension 08/21/2015  . Acute CVA (cerebrovascular accident) ([place]) 08/04/2015  . Left hemiparesis ([place]) 08/04/2015  . Primary osteoarthritis of left knee 08/04/2015  . Esophageal reflux 08/04/2015  . Hyperlipidemia LDL goal <70 08/04/2015  . Paroxysmal atrial fibrillation ([place]) 08/04/2015  . PVD (peripheral vascular disease) ([place]) 08/04/2015  . Protein-calorie malnutrition ([place]) 08/04/2015    Past Surgical History:  Procedure Laterality Date  . ABDOMINAL HYSTERECTOMY    . FOOT NEUROMA SURGERY    . SPINE SURGERY    . TONSILLECTOMY       OB History   None      Home Medications    Prior to Admission medications   Medication Sig Start Date End Date Taking? Authorizing Provider  acetaminophen (TYLENOL) 500 MG tablet  Take 500 mg by mouth 3 (three) times daily. (0900, 1600, 2100)   Yes [provider]  ALPRAZolam (XANAX) 0.25 MG tablet Take 0.25 mg by mouth 2 (two) times daily.    Yes [provider]  ALPRAZolam (XANAX) 0.25 MG tablet Take 0.25 mg by mouth 2 (two) times daily as needed for anxiety.   Yes [provider]  amiodarone (PACERONE) 100 MG tablet Take 1 tablet (100 mg total) daily by mouth. Patient taking differently: Take 100 mg by  mouth daily. (1000) 01/09/17  Yes Velna Ochs, MD  apixaban (ELIQUIS) 2.5 MG TABS tablet Take 1 tablet (2.5 mg total) by mouth 2 (two) times daily. Patient taking differently: Take 2.5 mg by mouth 2 (two) times daily. (1000 & 2100) 10/16/15  Yes Rosalin Hawking, MD  atorvastatin (LIPITOR) 20 MG tablet Take 20 mg by mouth daily at 8 pm. (2100)   Yes [provider]  capsaicin (ZOSTRIX) 0.025 % cream Apply 1 application topically at bedtime. (2100) APPLY BILATERAL TO FEET AT BEDTIME FOR PAIN   Yes [provider]  docusate sodium (COLACE) 100 MG capsule Take 100 mg by mouth 2 (two) times daily. (1000 & 2100)   Yes [provider]  gabapentin (NEURONTIN) 100 MG capsule Take 1 capsule (100 mg total) by mouth 2 (two) times daily. 100 mg in the morning and 200 mg at bedtime Patient taking differently: Take 100-200 mg by mouth See admin instructions. Take 1 capsule (100mg ) in the morning. Then take 2 capsules (200mg ) at bedtime.(1000 & 2100) 10/29/16  Yes Lockamy, Timothy, DO  hydrALAZINE (APRESOLINE) 10 MG tablet Take 10 mg by mouth 3 (three) times daily.   Yes [provider]  levETIRAcetam (KEPPRA) 500 MG tablet Take 1 tablet (500 mg total) 2 (two) times daily by mouth. Patient taking differently: Take 500 mg by mouth 2 (two) times daily. (1000 & 2100) 01/08/17  Yes Velna Ochs, MD  levocetirizine (XYZAL) 5 MG tablet Take 5 mg by mouth at bedtime. (2100)   Yes [provider]  losartan (COZAAR) 50 MG tablet Take 0.5 tablets (25 mg total) by mouth daily. Patient taking differently: Take 25 mg by mouth daily. (1000) 11/03/17  Yes Geradine Girt, DO  meclizine (ANTIVERT) 12.5 MG tablet Take 12.5 mg by mouth every 8 (eight) hours as needed for dizziness. Not to exceed 3 doses/24 hours   Yes [provider]  Melatonin 3 MG TABS Take 3 mg by mouth at bedtime. (2100)   Yes [provider]  pramoxine (SARNA SENSITIVE) 1 % LOTN Apply 1  application topically 2 (two) times daily. (0900 & 2100)   Yes [provider]  Psyllium 500 MG CAPS Take 500 mg by mouth at bedtime. (2100)   Yes [provider]  sertraline (ZOLOFT) 50 MG tablet Take 50 mg by mouth at bedtime. (2100)   Yes [provider]    Family History Family History  Problem Relation Age of Onset  . Heart attack Brother   . Stroke Neg Hx   . Neuropathy Neg Hx     Social History Social History   Tobacco Use  . Smoking status: Never Smoker  . Smokeless tobacco: Never Used  Substance Use Topics  . Alcohol use: No    Alcohol/week: 0.0 standard drinks  . Drug use: No     Allergies   Patient has no known allergies.   Review of Systems Review of Systems  Unable to perform ROS: Dementia  Neurological: Positive for headaches.     Physical Exam Updated Vital Signs BP (!) 222/78 (BP Location: Left Arm)   Pulse 72   Temp 98 F (36.7 C) (Oral)   Resp 18   SpO2 95%   Physical Exam  Constitutional: She is oriented to person, place, and time. She appears well-developed and well-nourished. No distress.  Patient is an elderly, debilitated female in no acute distress.  HENT:  Head: Normocephalic and atraumatic.  Neck: Normal range of motion. Neck supple.  There is no bony tenderness or step-off in the cervical spine.  There is some soft tissue tenderness.  Cardiovascular: Normal rate and regular rhythm. Exam reveals no gallop and no friction rub.  No murmur heard. Pulmonary/Chest: Effort normal and breath sounds normal. No respiratory distress. She has no wheezes.  Abdominal: Soft. Bowel sounds are normal. She exhibits no distension. There is no tenderness.  Musculoskeletal: Normal range of motion.  Pelvis is stable and hips have good range of motion.  There is no obvious deformity.  Neurological: She is alert and oriented to person, place, and time. No cranial nerve deficit. She exhibits normal muscle tone. Coordination  normal.  Neuro exam somewhat limited secondary to dementia and baseline medical condition, however she does move all extremities.  Skin: Skin is warm and dry. She is not diaphoretic.  Nursing note and vitals reviewed.    ED Treatments / Results  Labs (all labs ordered are listed, but only abnormal results are displayed) Labs Reviewed - No data to display  EKG None  Radiology No results found.  Procedures Procedures (including critical care time)  Medications Ordered in ED Medications - No data to display   Initial Impression / Assessment and Plan / ED Course  I have reviewed the triage vital signs and the nursing notes.  Pertinent labs & imaging results that were available during my care of the patient were reviewed by me and considered in my medical decision making (see chart for details).  CT of head and cervical spine are unremarkable.  Will discharge, to return prn.  Final Clinical Impressions(s) / ED Diagnoses   Final diagnoses:  None    ED Discharge Orders    None       Veryl Speak, MD 11/27/17 0157

## 2017-11-27 NOTE — ED Triage Notes (Signed)
Patient arrived with EMS from [facility] rolled out from her bed this morning and hit her head against the night stand , no LOC , reports mild headache . CBG= 98 . Hypertensive at arrival = 214/61 .

## 2017-11-27 NOTE — ED Notes (Signed)
[facility] called  

## 2017-11-27 NOTE — Discharge Instructions (Addendum)
Return to the emergency department for severe headache, or other new and concerning symptoms.

## 2017-11-27 NOTE — ED Notes (Signed)
Report called to [facility]. 

## 2017-11-28 DIAGNOSIS — H348192 Central retinal vein occlusion, unspecified eye, stable: Secondary | ICD-10-CM | POA: Diagnosis not present

## 2017-11-28 DIAGNOSIS — I509 Heart failure, unspecified: Secondary | ICD-10-CM | POA: Diagnosis not present

## 2017-11-28 DIAGNOSIS — R69 Illness, unspecified: Secondary | ICD-10-CM | POA: Diagnosis not present

## 2017-11-28 DIAGNOSIS — I739 Peripheral vascular disease, unspecified: Secondary | ICD-10-CM | POA: Diagnosis not present

## 2017-11-28 DIAGNOSIS — I110 Hypertensive heart disease with heart failure: Secondary | ICD-10-CM | POA: Diagnosis not present

## 2017-11-28 DIAGNOSIS — I480 Paroxysmal atrial fibrillation: Secondary | ICD-10-CM | POA: Diagnosis not present

## 2017-11-29 DIAGNOSIS — H34811 Central retinal vein occlusion, right eye, with macular edema: Secondary | ICD-10-CM | POA: Diagnosis not present

## 2017-12-02 DIAGNOSIS — I480 Paroxysmal atrial fibrillation: Secondary | ICD-10-CM | POA: Diagnosis not present

## 2017-12-02 DIAGNOSIS — I739 Peripheral vascular disease, unspecified: Secondary | ICD-10-CM | POA: Diagnosis not present

## 2017-12-02 DIAGNOSIS — R69 Illness, unspecified: Secondary | ICD-10-CM | POA: Diagnosis not present

## 2017-12-02 DIAGNOSIS — H348192 Central retinal vein occlusion, unspecified eye, stable: Secondary | ICD-10-CM | POA: Diagnosis not present

## 2017-12-02 DIAGNOSIS — I509 Heart failure, unspecified: Secondary | ICD-10-CM | POA: Diagnosis not present

## 2017-12-02 DIAGNOSIS — I110 Hypertensive heart disease with heart failure: Secondary | ICD-10-CM | POA: Diagnosis not present

## 2017-12-03 DIAGNOSIS — R509 Fever, unspecified: Secondary | ICD-10-CM | POA: Diagnosis not present

## 2017-12-03 DIAGNOSIS — R05 Cough: Secondary | ICD-10-CM | POA: Diagnosis not present

## 2017-12-05 DIAGNOSIS — I110 Hypertensive heart disease with heart failure: Secondary | ICD-10-CM | POA: Diagnosis not present

## 2017-12-05 DIAGNOSIS — I739 Peripheral vascular disease, unspecified: Secondary | ICD-10-CM | POA: Diagnosis not present

## 2017-12-05 DIAGNOSIS — I509 Heart failure, unspecified: Secondary | ICD-10-CM | POA: Diagnosis not present

## 2017-12-05 DIAGNOSIS — H348192 Central retinal vein occlusion, unspecified eye, stable: Secondary | ICD-10-CM | POA: Diagnosis not present

## 2017-12-05 DIAGNOSIS — I480 Paroxysmal atrial fibrillation: Secondary | ICD-10-CM | POA: Diagnosis not present

## 2017-12-05 DIAGNOSIS — R69 Illness, unspecified: Secondary | ICD-10-CM | POA: Diagnosis not present

## 2017-12-06 DIAGNOSIS — R05 Cough: Secondary | ICD-10-CM | POA: Diagnosis not present

## 2017-12-06 DIAGNOSIS — Z79899 Other long term (current) drug therapy: Secondary | ICD-10-CM | POA: Diagnosis not present

## 2017-12-06 DIAGNOSIS — G4089 Other seizures: Secondary | ICD-10-CM | POA: Diagnosis not present

## 2017-12-06 DIAGNOSIS — R296 Repeated falls: Secondary | ICD-10-CM | POA: Diagnosis not present

## 2017-12-06 DIAGNOSIS — R4182 Altered mental status, unspecified: Secondary | ICD-10-CM | POA: Diagnosis not present

## 2017-12-08 DIAGNOSIS — N390 Urinary tract infection, site not specified: Secondary | ICD-10-CM | POA: Diagnosis not present

## 2017-12-08 DIAGNOSIS — I509 Heart failure, unspecified: Secondary | ICD-10-CM | POA: Diagnosis not present

## 2017-12-08 DIAGNOSIS — I480 Paroxysmal atrial fibrillation: Secondary | ICD-10-CM | POA: Diagnosis not present

## 2017-12-08 DIAGNOSIS — R69 Illness, unspecified: Secondary | ICD-10-CM | POA: Diagnosis not present

## 2017-12-08 DIAGNOSIS — H348192 Central retinal vein occlusion, unspecified eye, stable: Secondary | ICD-10-CM | POA: Diagnosis not present

## 2017-12-08 DIAGNOSIS — I739 Peripheral vascular disease, unspecified: Secondary | ICD-10-CM | POA: Diagnosis not present

## 2017-12-08 DIAGNOSIS — I110 Hypertensive heart disease with heart failure: Secondary | ICD-10-CM | POA: Diagnosis not present

## 2017-12-12 ENCOUNTER — Encounter (HOSPITAL_COMMUNITY): Payer: Self-pay | Admitting: *Deleted

## 2017-12-12 ENCOUNTER — Other Ambulatory Visit: Payer: Self-pay

## 2017-12-12 ENCOUNTER — Ambulatory Visit: Payer: Self-pay | Admitting: Ophthalmology

## 2017-12-12 DIAGNOSIS — I509 Heart failure, unspecified: Secondary | ICD-10-CM | POA: Diagnosis not present

## 2017-12-12 DIAGNOSIS — I480 Paroxysmal atrial fibrillation: Secondary | ICD-10-CM | POA: Diagnosis not present

## 2017-12-12 DIAGNOSIS — R69 Illness, unspecified: Secondary | ICD-10-CM | POA: Diagnosis not present

## 2017-12-12 DIAGNOSIS — H348192 Central retinal vein occlusion, unspecified eye, stable: Secondary | ICD-10-CM | POA: Diagnosis not present

## 2017-12-12 DIAGNOSIS — I110 Hypertensive heart disease with heart failure: Secondary | ICD-10-CM | POA: Diagnosis not present

## 2017-12-12 DIAGNOSIS — I739 Peripheral vascular disease, unspecified: Secondary | ICD-10-CM | POA: Diagnosis not present

## 2017-12-12 NOTE — Progress Notes (Addendum)
Virginia Meyer lives at [facility] in [place].  I spoke with Virginia Meyer, the wellness nurse.  Virginia Meyer responds that patient's blood pressure has been controlled since she was here for surgery and pressure was high.  Patient is a 2 person transfere.  Patient has hearing aids, her daughter, Virginia Meyer will meet her her and take hearing aids prior to surgery.  Virginia Meyer and Virginia Meyer denies any chest pain or shortness of breath.  Patient did have a cough a few weeks ago and a chest x-ray was done, it was negative for any acute issue. Virginia Meyer has some memory issuses and yells out when left alone. Virginia Posey Pronto ordered Ativan 0.25 prior to living ASL.  PCP is Virginia Meyer, Virginia's making Home visits. I called Virginia Meyer office for orders and asked if Virginia Meyer was to be held. Virginia Meyer said no.

## 2017-12-12 NOTE — H&P (Signed)
  Date of examination:  11/29/17  Indication for surgery: significant vitreous opacities left eye  Pertinent past medical history:  Past Medical History:  Diagnosis Date  . Anxiety   . Arthritis   . Atrial fibrillation ([place])   . CHF (congestive heart failure) ([place])   . Chronic kidney disease    Acute Kidney Injury - 11/01/17  . COPD (chronic obstructive pulmonary disease) ([place])   . Fall   . GERD (gastroesophageal reflux disease)   . Heart failure ([place])   . HLD (hyperlipidemia)   . HTN (hypertension)   . Hypotension   . Major depressive disorder   . Memory deficit   . Neuropathy   . PVD (peripheral vascular disease) ([place])   . Seizures ([place])   . Stroke [facility])    left sided deficit  . UTI (urinary tract infection)     Pertinent ocular history:  History of vision loss left eye  Pertinent family history:  Family History  Problem Relation Age of Onset  . Heart attack Brother   . Stroke Neg Hx   . Neuropathy Neg Hx     General:  Healthy appearing patient in no distress.   Eyes:    Acuity OS LP  External: Within normal limits  Anterior segment: Within normal limits      Fundus: No view - B-scan - retina attached OS    Impression: Significant vitreous opacities left eye  Plan: Pars plana vitrectomy with endolaser left eye  Royston Cowper

## 2017-12-13 ENCOUNTER — Encounter (HOSPITAL_COMMUNITY)
Admission: RE | Disposition: A | Discharge status: Skilled Nursing Facility | Payer: Self-pay | Source: Ambulatory Visit | Attending: Ophthalmology

## 2017-12-13 ENCOUNTER — Ambulatory Visit (HOSPITAL_COMMUNITY): Payer: Medicare HMO | Admitting: Anesthesiology

## 2017-12-13 ENCOUNTER — Ambulatory Visit (HOSPITAL_COMMUNITY)
Admission: RE | Admit: 2017-12-13 | Discharge: 2017-12-13 | Disposition: A | Discharge status: Skilled Nursing Facility | Payer: Medicare HMO | Source: Ambulatory Visit | Attending: Ophthalmology | Admitting: Ophthalmology

## 2017-12-13 ENCOUNTER — Encounter (HOSPITAL_COMMUNITY): Payer: Self-pay

## 2017-12-13 ENCOUNTER — Other Ambulatory Visit: Payer: Self-pay

## 2017-12-13 DIAGNOSIS — I6930 Unspecified sequelae of cerebral infarction: Secondary | ICD-10-CM | POA: Insufficient documentation

## 2017-12-13 DIAGNOSIS — H3322 Serous retinal detachment, left eye: Secondary | ICD-10-CM | POA: Insufficient documentation

## 2017-12-13 DIAGNOSIS — I739 Peripheral vascular disease, unspecified: Secondary | ICD-10-CM | POA: Insufficient documentation

## 2017-12-13 DIAGNOSIS — I129 Hypertensive chronic kidney disease with stage 1 through stage 4 chronic kidney disease, or unspecified chronic kidney disease: Secondary | ICD-10-CM | POA: Diagnosis not present

## 2017-12-13 DIAGNOSIS — J449 Chronic obstructive pulmonary disease, unspecified: Secondary | ICD-10-CM | POA: Insufficient documentation

## 2017-12-13 DIAGNOSIS — I509 Heart failure, unspecified: Secondary | ICD-10-CM | POA: Diagnosis not present

## 2017-12-13 DIAGNOSIS — I110 Hypertensive heart disease with heart failure: Secondary | ICD-10-CM | POA: Diagnosis not present

## 2017-12-13 DIAGNOSIS — H43392 Other vitreous opacities, left eye: Secondary | ICD-10-CM | POA: Diagnosis not present

## 2017-12-13 DIAGNOSIS — H4312 Vitreous hemorrhage, left eye: Secondary | ICD-10-CM | POA: Insufficient documentation

## 2017-12-13 DIAGNOSIS — N189 Chronic kidney disease, unspecified: Secondary | ICD-10-CM | POA: Diagnosis not present

## 2017-12-13 DIAGNOSIS — H3342 Traction detachment of retina, left eye: Secondary | ICD-10-CM | POA: Diagnosis not present

## 2017-12-13 HISTORY — PX: PARS PLANA VITRECTOMY: SHX2166

## 2017-12-13 HISTORY — DX: Chronic obstructive pulmonary disease, unspecified: J44.9

## 2017-12-13 HISTORY — DX: Unspecified osteoarthritis, unspecified site: M19.90

## 2017-12-13 HISTORY — DX: Other amnesia: R41.3

## 2017-12-13 LAB — COMPREHENSIVE METABOLIC PANEL
ALBUMIN: 3.6 g/dL (ref 3.5–5.0)
ALK PHOS: 92 U/L (ref 38–126)
ALT: 14 U/L (ref 0–44)
ANION GAP: 10 (ref 5–15)
AST: 25 U/L (ref 15–41)
BILIRUBIN TOTAL: 0.8 mg/dL (ref 0.3–1.2)
BUN: 8 mg/dL (ref 8–23)
CALCIUM: 9.6 mg/dL (ref 8.9–10.3)
CO2: 23 mmol/L (ref 22–32)
Chloride: 107 mmol/L (ref 98–111)
Creatinine, Ser: 0.76 mg/dL (ref 0.44–1.00)
Glucose, Bld: 85 mg/dL (ref 70–99)
Potassium: 3.6 mmol/L (ref 3.5–5.1)
Sodium: 140 mmol/L (ref 135–145)
TOTAL PROTEIN: 6.7 g/dL (ref 6.5–8.1)

## 2017-12-13 LAB — CBC
HCT: 41.7 % (ref 36.0–46.0)
Hemoglobin: 12.5 g/dL (ref 12.0–15.0)
MCH: 27.4 pg (ref 26.0–34.0)
MCHC: 30 g/dL (ref 30.0–36.0)
MCV: 91.4 fL (ref 80.0–100.0)
NRBC: 0 % (ref 0.0–0.2)
PLATELETS: 202 10*3/uL (ref 150–400)
RBC: 4.56 MIL/uL (ref 3.87–5.11)
RDW: 15.6 % — ABNORMAL HIGH (ref 11.5–15.5)
WBC: 6.5 10*3/uL (ref 4.0–10.5)

## 2017-12-13 SURGERY — PARS PLANA VITRECTOMY WITH 25 GAUGE
Anesthesia: General | Site: Eye | Laterality: Left

## 2017-12-13 MED ORDER — OFLOXACIN 0.3 % OP SOLN
1.0000 [drp] | Freq: Once | OPHTHALMIC | Status: AC
  Administered 2017-12-13: 1 [drp] via OPHTHALMIC
  Filled 2017-12-13: qty 5

## 2017-12-13 MED ORDER — LIDOCAINE HCL 2 % IJ SOLN
INTRAMUSCULAR | Status: AC
  Filled 2017-12-13: qty 20

## 2017-12-13 MED ORDER — MIDAZOLAM HCL 2 MG/2ML IJ SOLN
INTRAMUSCULAR | Status: AC
  Filled 2017-12-13: qty 2

## 2017-12-13 MED ORDER — SUGAMMADEX SODIUM 200 MG/2ML IV SOLN
INTRAVENOUS | Status: DC | PRN
  Administered 2017-12-13: 100 mg via INTRAVENOUS

## 2017-12-13 MED ORDER — TROPICAMIDE 1 % OP SOLN
1.0000 [drp] | OPHTHALMIC | Status: AC
  Administered 2017-12-13 (×3): 1 [drp] via OPHTHALMIC
  Filled 2017-12-13: qty 15

## 2017-12-13 MED ORDER — LACTATED RINGERS IV SOLN
INTRAVENOUS | Status: DC
  Administered 2017-12-13: 12:00:00 via INTRAVENOUS

## 2017-12-13 MED ORDER — FENTANYL CITRATE (PF) 250 MCG/5ML IJ SOLN
INTRAMUSCULAR | Status: AC
  Filled 2017-12-13: qty 5

## 2017-12-13 MED ORDER — BUPIVACAINE HCL (PF) 0.75 % IJ SOLN
INTRAMUSCULAR | Status: AC
  Filled 2017-12-13: qty 10

## 2017-12-13 MED ORDER — TOBRAMYCIN-DEXAMETHASONE 0.3-0.1 % OP OINT
TOPICAL_OINTMENT | OPHTHALMIC | Status: DC | PRN
  Administered 2017-12-13: 1 via OPHTHALMIC

## 2017-12-13 MED ORDER — DEXAMETHASONE SODIUM PHOSPHATE 10 MG/ML IJ SOLN
INTRAMUSCULAR | Status: DC | PRN
  Administered 2017-12-13: 10 mg

## 2017-12-13 MED ORDER — ONDANSETRON HCL 4 MG/2ML IJ SOLN
INTRAMUSCULAR | Status: DC | PRN
  Administered 2017-12-13: 4 mg via INTRAVENOUS

## 2017-12-13 MED ORDER — PHENYLEPHRINE HCL 2.5 % OP SOLN
1.0000 [drp] | OPHTHALMIC | Status: AC
  Administered 2017-12-13 (×3): 1 [drp] via OPHTHALMIC
  Filled 2017-12-13: qty 2

## 2017-12-13 MED ORDER — CEFAZOLIN SUBCONJUNCTIVAL INJECTION 100 MG/0.5 ML
100.0000 mg | INJECTION | SUBCONJUNCTIVAL | Status: AC
  Administered 2017-12-13: 100 mg via SUBCONJUNCTIVAL
  Filled 2017-12-13: qty 1

## 2017-12-13 MED ORDER — LABETALOL HCL 5 MG/ML IV SOLN
INTRAVENOUS | Status: AC
  Filled 2017-12-13: qty 4

## 2017-12-13 MED ORDER — HYPROMELLOSE (GONIOSCOPIC) 2.5 % OP SOLN
OPHTHALMIC | Status: DC | PRN
  Administered 2017-12-13: 2 [drp] via OPHTHALMIC

## 2017-12-13 MED ORDER — SODIUM CHLORIDE 0.9 % IV SOLN
INTRAVENOUS | Status: DC | PRN
  Administered 2017-12-13: 40 ug/min via INTRAVENOUS

## 2017-12-13 MED ORDER — ATROPINE SULFATE 1 % OP SOLN
OPHTHALMIC | Status: AC
  Filled 2017-12-13: qty 5

## 2017-12-13 MED ORDER — DEXAMETHASONE SODIUM PHOSPHATE 10 MG/ML IJ SOLN
INTRAMUSCULAR | Status: DC | PRN
  Administered 2017-12-13: 5 mg via INTRAVENOUS

## 2017-12-13 MED ORDER — HYPROMELLOSE (GONIOSCOPIC) 2.5 % OP SOLN
OPHTHALMIC | Status: AC
  Filled 2017-12-13: qty 15

## 2017-12-13 MED ORDER — FENTANYL CITRATE (PF) 250 MCG/5ML IJ SOLN
INTRAMUSCULAR | Status: DC | PRN
  Administered 2017-12-13: 25 ug via INTRAVENOUS

## 2017-12-13 MED ORDER — BSS IO SOLN
INTRAOCULAR | Status: DC | PRN
  Administered 2017-12-13: 15 mL

## 2017-12-13 MED ORDER — HYALURONIDASE HUMAN 150 UNIT/ML IJ SOLN
INTRAMUSCULAR | Status: AC
  Filled 2017-12-13: qty 1

## 2017-12-13 MED ORDER — FENTANYL CITRATE (PF) 100 MCG/2ML IJ SOLN: 25.0000 ug | INTRAMUSCULAR | Status: DC | PRN

## 2017-12-13 MED ORDER — DEXAMETHASONE SODIUM PHOSPHATE 10 MG/ML IJ SOLN
INTRAMUSCULAR | Status: AC
  Filled 2017-12-13: qty 1

## 2017-12-13 MED ORDER — BSS IO SOLN
INTRAOCULAR | Status: AC
  Filled 2017-12-13: qty 15

## 2017-12-13 MED ORDER — ONDANSETRON HCL 4 MG/2ML IJ SOLN: 4.0000 mg | Freq: Four times a day (QID) | INTRAMUSCULAR | Status: DC | PRN

## 2017-12-13 MED ORDER — HYDRALAZINE HCL 20 MG/ML IJ SOLN
INTRAMUSCULAR | Status: AC
  Administered 2017-12-13: 5 mg
  Filled 2017-12-13: qty 1

## 2017-12-13 MED ORDER — INDOCYANINE GREEN 25 MG IV SOLR
INTRAVENOUS | Status: AC
  Filled 2017-12-13: qty 25

## 2017-12-13 MED ORDER — BSS PLUS IO SOLN
INTRAOCULAR | Status: AC
  Filled 2017-12-13: qty 500

## 2017-12-13 MED ORDER — OXYCODONE HCL 5 MG PO TABS: 5.0000 mg | ORAL_TABLET | Freq: Once | ORAL | Status: DC | PRN

## 2017-12-13 MED ORDER — EPINEPHRINE PF 1 MG/ML IJ SOLN
INTRAMUSCULAR | Status: AC
  Filled 2017-12-13: qty 1

## 2017-12-13 MED ORDER — EPINEPHRINE PF 1 MG/ML IJ SOLN
INTRAOCULAR | Status: DC | PRN
  Administered 2017-12-13: 500.3 mL

## 2017-12-13 MED ORDER — PROPOFOL 10 MG/ML IV BOLUS
INTRAVENOUS | Status: AC
  Filled 2017-12-13: qty 40

## 2017-12-13 MED ORDER — CYCLOPENTOLATE HCL 1 % OP SOLN
1.0000 [drp] | OPHTHALMIC | Status: AC
  Administered 2017-12-13 (×3): 1 [drp] via OPHTHALMIC
  Filled 2017-12-13: qty 2

## 2017-12-13 MED ORDER — ROCURONIUM BROMIDE 10 MG/ML (PF) SYRINGE
PREFILLED_SYRINGE | INTRAVENOUS | Status: DC | PRN
  Administered 2017-12-13: 25 mg via INTRAVENOUS

## 2017-12-13 MED ORDER — TOBRAMYCIN-DEXAMETHASONE 0.3-0.1 % OP OINT
TOPICAL_OINTMENT | OPHTHALMIC | Status: AC
  Filled 2017-12-13: qty 3.5

## 2017-12-13 MED ORDER — PROPOFOL 10 MG/ML IV BOLUS
INTRAVENOUS | Status: DC | PRN
  Administered 2017-12-13: 90 mg via INTRAVENOUS

## 2017-12-13 MED ORDER — LIDOCAINE HCL 2 % IJ SOLN
INTRAMUSCULAR | Status: DC | PRN
  Administered 2017-12-13: 21 mL via RETROBULBAR

## 2017-12-13 MED ORDER — LIDOCAINE 2% (20 MG/ML) 5 ML SYRINGE
INTRAMUSCULAR | Status: DC | PRN
  Administered 2017-12-13: 80 mg via INTRAVENOUS

## 2017-12-13 MED ORDER — OXYCODONE HCL 5 MG/5ML PO SOLN: 5.0000 mg | Freq: Once | ORAL | Status: DC | PRN

## 2017-12-13 SURGICAL SUPPLY — 56 items
APPLICATOR COTTON TIP 6IN STRL (MISCELLANEOUS) ×2 IMPLANT
BLADE MVR KNIFE 20G (BLADE) IMPLANT
BLADE STAB KNIFE 15DEG (BLADE) IMPLANT
CABLE BIPOLOR RESECTION CORD (MISCELLANEOUS) ×2 IMPLANT
CANNULA ANT CHAM MAIN (OPHTHALMIC RELATED) IMPLANT
CANNULA DUAL BORE 23G (CANNULA) IMPLANT
CANNULA DUALBORE 25G (CANNULA) IMPLANT
CANNULA VLV SOFT TIP 25GA (OPHTHALMIC) ×4 IMPLANT
CAUTERY EYE LOW TEMP 1300F FIN (OPHTHALMIC RELATED) IMPLANT
CLSR STERI-STRIP ANTIMIC 1/2X4 (GAUZE/BANDAGES/DRESSINGS) ×2 IMPLANT
COVER MAYO STAND STRL (DRAPES) IMPLANT
COVER WAND RF STERILE (DRAPES) IMPLANT
DRAPE HALF SHEET 40X57 (DRAPES) ×2 IMPLANT
DRAPE INCISE 51X51 W/FILM STRL (DRAPES) ×2 IMPLANT
DRAPE RETRACTOR (MISCELLANEOUS) ×2 IMPLANT
ERASER HMR WETFIELD 23G BP (MISCELLANEOUS) IMPLANT
FORCEPS ECKARDT ILM 25G SERR (OPHTHALMIC RELATED) IMPLANT
FORCEPS GRIESHABER ILM 25G A (INSTRUMENTS) IMPLANT
GAS AUTO FILL CONSTEL (OPHTHALMIC)
GAS AUTO FILL CONSTELLATION (OPHTHALMIC) IMPLANT
GLOVE ECLIPSE 7.5 STRL STRAW (GLOVE) ×2 IMPLANT
GOWN STRL REUS W/ TWL LRG LVL3 (GOWN DISPOSABLE) ×1 IMPLANT
GOWN STRL REUS W/TWL LRG LVL3 (GOWN DISPOSABLE) ×1
KIT BASIN OR (CUSTOM PROCEDURE TRAY) ×2 IMPLANT
KIT TURNOVER KIT B (KITS) ×2 IMPLANT
LENS BIOM SUPER VIEW SET DISP (OPHTHALMIC RELATED) ×2 IMPLANT
MICROPICK 25G (MISCELLANEOUS)
NEEDLE 18GX1X1/2 (RX/OR ONLY) (NEEDLE) ×2 IMPLANT
NEEDLE 25GX 5/8IN NON SAFETY (NEEDLE) ×2 IMPLANT
NEEDLE FILTER BLUNT 18X 1/2SAF (NEEDLE) ×1
NEEDLE FILTER BLUNT 18X1 1/2 (NEEDLE) ×1 IMPLANT
NEEDLE HYPO 25GX1X1/2 BEV (NEEDLE) ×2 IMPLANT
NEEDLE HYPO 30X.5 LL (NEEDLE) ×4 IMPLANT
NEEDLE RETROBULBAR 25GX1.5 (NEEDLE) ×2 IMPLANT
NS IRRIG 1000ML POUR BTL (IV SOLUTION) ×2 IMPLANT
OIL SILICONE OPHTHALMIC 1000 (Ophthalmic Related) ×2 IMPLANT
PACK FRAGMATOME (OPHTHALMIC) IMPLANT
PACK VITRECTOMY CUSTOM (CUSTOM PROCEDURE TRAY) ×2 IMPLANT
PAD ARMBOARD 7.5X6 YLW CONV (MISCELLANEOUS) ×4 IMPLANT
PAK PIK VITRECTOMY CVS 25GA (OPHTHALMIC) ×2 IMPLANT
PENCIL BIPOLAR 25GA STR DISP (OPHTHALMIC RELATED) ×2 IMPLANT
PICK MICROPICK 25G (MISCELLANEOUS) IMPLANT
PROBE LASER ILLUM FLEX CVD 25G (OPHTHALMIC) ×2 IMPLANT
ROLLS DENTAL (MISCELLANEOUS) IMPLANT
SCRAPER DIAMOND 25GA (OPHTHALMIC RELATED) IMPLANT
SET INJECTOR OIL FLUID CONSTEL (OPHTHALMIC) ×2 IMPLANT
SOLUTION ANTI FOG 6CC (MISCELLANEOUS) ×2 IMPLANT
STOPCOCK 4 WAY LG BORE MALE ST (IV SETS) IMPLANT
SUT VICRYL 7 0 TG140 8 (SUTURE) ×2 IMPLANT
SUT VICRYL 8 0 TG140 8 (SUTURE) IMPLANT
SYR 10ML LL (SYRINGE) IMPLANT
SYR 20CC LL (SYRINGE) ×2 IMPLANT
SYR 5ML LL (SYRINGE) ×2 IMPLANT
SYR TB 1ML LUER SLIP (SYRINGE) ×2 IMPLANT
WATER STERILE IRR 1000ML POUR (IV SOLUTION) ×2 IMPLANT
WIPE INSTRUMENT VISIWIPE 73X73 (MISCELLANEOUS) IMPLANT

## 2017-12-13 NOTE — Progress Notes (Addendum)
BP has been elevated ever since arrival to Short Stay  213/43 211/45 241/54 210/52  All of these were taken on right arm 266/62  Was taken on left.  new orders for hydrazaline given IV After receiving 5mg  IV hydralazine, BP now 199/49  Dr Cherylynn Ridges aware.

## 2017-12-13 NOTE — Op Note (Signed)
Virginia Meyer 12/13/2017 Diagnosis: Hemorrhagic retinal detachment with significant membranes left eye  Procedure: COMPLEX RETINAL DETACHMENT REPAIR LEFT EYE WITH VITRECTOMY, MEMBRANECTOMY, ENDOCAUTERY, DRAINAGE OF SUBRETINAL FLUID, AIR/FLUID EXCHANGE, ENDOLASER PANRENTIAL PHOTOCOAGULATION, AND PLACEMENT OF SILICONE OIL TAMPONADE  (Left) Operative Eye:  left eye  Surgeon: Royston Cowper Estimated Blood Loss: minimal Specimens for Pathology:  None Complications: none    The  patient was prepped and draped in the usual fashion for ocular surgery on the  left eye .  A lid speculum was placed.  Infusion line and trocar was placed at the 4 o'clock position approximately 3.5 mm from the surgical limbus.   The infusion line was allowed to run and then clamped when placed at the cannula opening. The line was inserted and secured to the drape with an adhesive strip.   Active trocars/cannula were placed at the 10 and 2 o'clock positions approximately 3.5 mm from the surgical limbus.   The light pipe and vitreous cutter were inserted into the vitreous cavity and a core vitrectomy was performed.  There was significant vitreous hemorrhage and clot that was carefully removed to the periphery with the vitrector.  Overlying membranes were removed.  After membranectomy, scleral depression was used to removed any remaning vitreous skirt.  Endocautery was used to make a retinotomy in the inferotemporal macula to drain subretinal fluid and hemorrhage.  The extrusion cannula was used along with air/fluid exchange to flatten the retina and remove subretinal fluid.  Endolaser was applied 360 degrees with the aid of scleral indentation to ensure adequate demarcation of the peripheral retina.  Silicone oil was then placed in the eye to achieve a near complete oil fill.  The trocars were sequentially removed.  Normal intraocular pressure was noted by digital palpapation.  Subconjunctival injection of Ancef and  Dexamethasone 4mg /21ml was placed.   The speculum and drapes were removed and the eye was patched with Polymixin/Bacitracin ophthalmic ointment. An eye shield was placed and the patient was transferred alert and conversant with stable vital signs to the post operative recovery area.  The patient tolerated the procedure well and no complications were noted.  Royston Cowper MD

## 2017-12-13 NOTE — Anesthesia Preprocedure Evaluation (Addendum)
Anesthesia Evaluation  Patient identified by MRN, date of birth, ID band Patient awake    Reviewed: Allergy & Precautions, H&P , NPO status , Patient's Chart, lab work & pertinent test results  Airway Mallampati: II  TM Distance: >3 FB Neck ROM: Full    Dental no notable dental hx. (+) Teeth Intact, Dental Advisory Given   Pulmonary COPD,    Pulmonary exam normal breath sounds clear to auscultation       Cardiovascular hypertension, + Peripheral Vascular Disease and +CHF  Normal cardiovascular exam Rhythm:Regular Rate:Normal     Neuro/Psych Seizures -,  Anxiety CVA    GI/Hepatic Neg liver ROS, GERD  ,  Endo/Other  negative endocrine ROS  Renal/GU Renal disease     Musculoskeletal  (+) Arthritis ,   Abdominal   Peds  Hematology negative hematology ROS (+)   Anesthesia Other Findings   Reproductive/Obstetrics                            Anesthesia Physical Anesthesia Plan  ASA: III  Anesthesia Plan: General   Post-op Pain Management:    Induction: Intravenous  PONV Risk Score and Plan: 3 and Treatment may vary due to age or medical condition, Dexamethasone and Ondansetron  Airway Management Planned: Oral ETT  Additional Equipment:   Intra-op Plan:   Post-operative Plan: Extubation in OR  Informed Consent: I have reviewed the patients History and Physical, chart, labs and discussed the procedure including the risks, benefits and alternatives for the proposed anesthesia with the patient or authorized representative who has indicated his/her understanding and acceptance.   Dental advisory given  Plan Discussed with: CRNA  Anesthesia Plan Comments:         Anesthesia Quick Evaluation

## 2017-12-13 NOTE — Brief Op Note (Signed)
12/13/2017  4:02 PM  PATIENT:  Melton Alar Fehr  84 y.o. female  PRE-OPERATIVE DIAGNOSIS:  VITREOUS HEMORRHAGE, LEFT EYE  POST-OPERATIVE DIAGNOSIS:  VITREOUS HEMORRHAGE with hemorrhagic retinal detachment, LEFT EYE  PROCEDURE:  Procedure(s): COMPLEX RETINAL DETACHMENT REPAIR LEFT EYE WITH VITRECTOMY, MEMBRANECTOMY, ENDOCAUTERY, DRAINAGE OF SUBRETINAL FLUID, AIR/FLUID EXCHANGE, ENDOLASER PANRENTIAL PHOTOCOAGULATION, AND PLACEMENT OF SILICONE OIL TAMPONADE  (Left)  SURGEON:  Surgeon(s) and Role:    * Jalene Mullet, MD - Primary  PHYSICIAN ASSISTANT:   ASSISTANTS: none   ANESTHESIA:   local and MAC  EBL:  MINIMAL  BLOOD ADMINISTERED:none  DRAINS: none   LOCAL MEDICATIONS USED:  MARCAINE    and LIDOCAINE   SPECIMEN:  No Specimen  DISPOSITION OF SPECIMEN:  N/A  COUNTS:  YES  TOURNIQUET:  * No tourniquets in log *  DICTATION: .Note written in EPIC  PLAN OF CARE: Discharge to home after PACU  PATIENT DISPOSITION:  PACU - hemodynamically stable.   Delay start of Pharmacological VTE agent (>24hrs) due to surgical blood loss or risk of bleeding: NO

## 2017-12-13 NOTE — Anesthesia Procedure Notes (Signed)
Procedure Name: Intubation Date/Time: 12/13/2017 2:39 PM Performed by: Colin Benton, CRNA Pre-anesthesia Checklist: Patient identified, Emergency Drugs available, Suction available and Patient being monitored Patient Re-evaluated:Patient Re-evaluated prior to induction Oxygen Delivery Method: Circle system utilized Preoxygenation: Pre-oxygenation with 100% oxygen Induction Type: IV induction Ventilation: Mask ventilation without difficulty Laryngoscope Size: Miller and 2 Grade View: Grade II Tube type: Oral Tube size: 7.0 mm Number of attempts: 1 Airway Equipment and Method: Stylet Placement Confirmation: ETT inserted through vocal cords under direct vision,  positive ETCO2 and breath sounds checked- equal and bilateral Secured at: 21 cm Tube secured with: Tape Dental Injury: Teeth and Oropharynx as per pre-operative assessment

## 2017-12-13 NOTE — Discharge Instructions (Addendum)
DO NOT SLEEP ON BACK, THE EYE PRESSURE CAN GO UP AND CAUSE VISION LOSS   SLEEP ON SIDE WITH NOSE TO PILLOW  DURING DAY KEEP UPRIGHT 

## 2017-12-13 NOTE — Anesthesia Postprocedure Evaluation (Signed)
Anesthesia Post Note  Patient: Virginia Meyer  Procedure(s) Performed: COMPLEX  RETINAL DETACHMENT REPAIR WITH VITRECTOMY MEMBRANECTOMY ENDOCAUTERY DRAINAGE OF SUBRETINAL FLUID AIR/FLUID EXCHANGE ENDOLASER PANRETINAL PHOTOCOAGULATION AND PLACEMENT OF SILICONE OIL TAMPONADE , LEFT EYE (Left Eye)     Patient location during evaluation: PACU Anesthesia Type: General Level of consciousness: awake and alert Pain management: pain level controlled Vital Signs Assessment: post-procedure vital signs reviewed and stable Respiratory status: spontaneous breathing, nonlabored ventilation, respiratory function stable and patient connected to nasal cannula oxygen Cardiovascular status: blood pressure returned to baseline and stable Postop Assessment: no apparent nausea or vomiting Anesthetic complications: no    Last Vitals:  Vitals:   12/13/17 1710 12/13/17 1725  BP: (!) 144/39   Pulse: 61 60  Resp: 18 18  Temp:    SpO2: 93% 93%    Last Pain:  Vitals:   12/13/17 1800  TempSrc:   PainSc: [place]

## 2017-12-13 NOTE — H&P (Signed)
NO CHANGE IN EXAM SINCE NOTE PLACED YESTERDAY - CONFIRMED AT BEDSIDE       Signed          Date of examination:  11/29/17  Indication for surgery: significant vitreous opacities left eye  Pertinent past medical history:      Past Medical History:  Diagnosis Date  . Anxiety   . Arthritis   . Atrial fibrillation ([place])   . CHF (congestive heart failure) ([place])   . Chronic kidney disease    Acute Kidney Injury - 11/01/17  . COPD (chronic obstructive pulmonary disease) ([place])   . Fall   . GERD (gastroesophageal reflux disease)   . Heart failure ([place])   . HLD (hyperlipidemia)   . HTN (hypertension)   . Hypotension   . Major depressive disorder   . Memory deficit   . Neuropathy   . PVD (peripheral vascular disease) ([place])   . Seizures ([place])   . Stroke [facility])    left sided deficit  . UTI (urinary tract infection)     Pertinent ocular history:  History of vision loss left eye  Pertinent family history:  Family History  Problem Relation Age of Onset  . Heart attack Brother   . Stroke Neg Hx   . Neuropathy Neg Hx     General:  Healthy appearing patient in no distress.   Eyes:               Acuity OS LP             External: Within normal limits             Anterior segment: Within normal limits                            Fundus: No view - B-scan - retina attached OS               Impression: Significant vitreous opacities left eye  Plan: Pars plana vitrectomy with endolaser left eye  Royston Cowper         Electronically signed by Jalene Mullet, MD at 12/12/2017 2:27 PM

## 2017-12-13 NOTE — Transfer of Care (Signed)
Immediate Anesthesia Transfer of Care Note  Patient: Virginia Meyer  Procedure(s) Performed: LEFT EYE VITRECTOMY PARS PLANA APPROACH WITH ENDOLASER PANRENTIAL PHOTOCOAGULATION 25 GAUGE (Left Eye)  Patient Location: PACU  Anesthesia Type:General  Level of Consciousness: drowsy  Airway & Oxygen Therapy: Patient Spontanous Breathing and Patient connected to nasal cannula oxygen  Post-op Assessment: Report given to RN and Post -op Vital signs reviewed and stable  Post vital signs: Reviewed and stable  Last Vitals:  Vitals Value Taken Time  BP 177/58 12/13/2017  4:09 PM  Temp    Pulse 76 12/13/2017  4:10 PM  Resp 18 12/13/2017  4:10 PM  SpO2 94 % 12/13/2017  4:10 PM  Vitals shown include unvalidated device data.  Last Pain:  Vitals:   12/13/17 1119  TempSrc: Oral      Patients Stated Pain Goal: 3 (69/24/93 2419)  Complications: No apparent anesthesia complications

## 2017-12-14 ENCOUNTER — Encounter (HOSPITAL_COMMUNITY): Payer: Self-pay | Admitting: Ophthalmology

## 2017-12-16 DIAGNOSIS — R69 Illness, unspecified: Secondary | ICD-10-CM | POA: Diagnosis not present

## 2017-12-16 DIAGNOSIS — I509 Heart failure, unspecified: Secondary | ICD-10-CM | POA: Diagnosis not present

## 2017-12-16 DIAGNOSIS — I110 Hypertensive heart disease with heart failure: Secondary | ICD-10-CM | POA: Diagnosis not present

## 2017-12-16 DIAGNOSIS — H348192 Central retinal vein occlusion, unspecified eye, stable: Secondary | ICD-10-CM | POA: Diagnosis not present

## 2017-12-16 DIAGNOSIS — I739 Peripheral vascular disease, unspecified: Secondary | ICD-10-CM | POA: Diagnosis not present

## 2017-12-16 DIAGNOSIS — I480 Paroxysmal atrial fibrillation: Secondary | ICD-10-CM | POA: Diagnosis not present

## 2017-12-19 ENCOUNTER — Emergency Department (HOSPITAL_COMMUNITY): Payer: Medicare HMO

## 2017-12-19 ENCOUNTER — Emergency Department (HOSPITAL_COMMUNITY)
Admission: EM | Admit: 2017-12-19 | Discharge: 2017-12-19 | Disposition: A | Discharge status: Home / Self Care | Payer: Medicare HMO | Attending: Emergency Medicine | Admitting: Emergency Medicine

## 2017-12-19 DIAGNOSIS — S62646A Nondisplaced fracture of proximal phalanx of right little finger, initial encounter for closed fracture: Secondary | ICD-10-CM | POA: Diagnosis not present

## 2017-12-19 DIAGNOSIS — R410 Disorientation, unspecified: Secondary | ICD-10-CM | POA: Diagnosis not present

## 2017-12-19 DIAGNOSIS — S62236A Other nondisplaced fracture of base of first metacarpal bone, unspecified hand, initial encounter for closed fracture: Secondary | ICD-10-CM | POA: Diagnosis not present

## 2017-12-19 DIAGNOSIS — S0083XA Contusion of other part of head, initial encounter: Secondary | ICD-10-CM | POA: Insufficient documentation

## 2017-12-19 DIAGNOSIS — S62101A Fracture of unspecified carpal bone, right wrist, initial encounter for closed fracture: Secondary | ICD-10-CM | POA: Diagnosis not present

## 2017-12-19 DIAGNOSIS — S62171A Displaced fracture of trapezium [larger multangular], right wrist, initial encounter for closed fracture: Secondary | ICD-10-CM | POA: Insufficient documentation

## 2017-12-19 DIAGNOSIS — I110 Hypertensive heart disease with heart failure: Secondary | ICD-10-CM | POA: Insufficient documentation

## 2017-12-19 DIAGNOSIS — D1722 Benign lipomatous neoplasm of skin and subcutaneous tissue of left arm: Secondary | ICD-10-CM | POA: Diagnosis not present

## 2017-12-19 DIAGNOSIS — R69 Illness, unspecified: Secondary | ICD-10-CM | POA: Diagnosis not present

## 2017-12-19 DIAGNOSIS — S62340A Nondisplaced fracture of base of second metacarpal bone, right hand, initial encounter for closed fracture: Secondary | ICD-10-CM | POA: Diagnosis not present

## 2017-12-19 DIAGNOSIS — J449 Chronic obstructive pulmonary disease, unspecified: Secondary | ICD-10-CM | POA: Insufficient documentation

## 2017-12-19 DIAGNOSIS — S62114A Nondisplaced fracture of triquetrum [cuneiform] bone, right wrist, initial encounter for closed fracture: Secondary | ICD-10-CM | POA: Insufficient documentation

## 2017-12-19 DIAGNOSIS — Z7901 Long term (current) use of anticoagulants: Secondary | ICD-10-CM | POA: Diagnosis not present

## 2017-12-19 DIAGNOSIS — I959 Hypotension, unspecified: Secondary | ICD-10-CM | POA: Diagnosis not present

## 2017-12-19 DIAGNOSIS — S62342A Nondisplaced fracture of base of third metacarpal bone, right hand, initial encounter for closed fracture: Secondary | ICD-10-CM | POA: Diagnosis not present

## 2017-12-19 DIAGNOSIS — Y92122 Bedroom in nursing home as the place of occurrence of the external cause: Secondary | ICD-10-CM | POA: Diagnosis not present

## 2017-12-19 DIAGNOSIS — R279 Unspecified lack of coordination: Secondary | ICD-10-CM | POA: Diagnosis not present

## 2017-12-19 DIAGNOSIS — S62610A Displaced fracture of proximal phalanx of right index finger, initial encounter for closed fracture: Secondary | ICD-10-CM | POA: Diagnosis not present

## 2017-12-19 DIAGNOSIS — Y999 Unspecified external cause status: Secondary | ICD-10-CM | POA: Diagnosis not present

## 2017-12-19 DIAGNOSIS — W19XXXA Unspecified fall, initial encounter: Secondary | ICD-10-CM | POA: Diagnosis not present

## 2017-12-19 DIAGNOSIS — S0990XA Unspecified injury of head, initial encounter: Secondary | ICD-10-CM | POA: Diagnosis not present

## 2017-12-19 DIAGNOSIS — S4992XA Unspecified injury of left shoulder and upper arm, initial encounter: Secondary | ICD-10-CM | POA: Diagnosis not present

## 2017-12-19 DIAGNOSIS — M25512 Pain in left shoulder: Secondary | ICD-10-CM | POA: Diagnosis not present

## 2017-12-19 DIAGNOSIS — S62312A Displaced fracture of base of third metacarpal bone, right hand, initial encounter for closed fracture: Secondary | ICD-10-CM | POA: Diagnosis not present

## 2017-12-19 DIAGNOSIS — I509 Heart failure, unspecified: Secondary | ICD-10-CM | POA: Insufficient documentation

## 2017-12-19 DIAGNOSIS — S62343A Nondisplaced fracture of base of third metacarpal bone, left hand, initial encounter for closed fracture: Secondary | ICD-10-CM | POA: Diagnosis not present

## 2017-12-19 DIAGNOSIS — S62231A Other displaced fracture of base of first metacarpal bone, right hand, initial encounter for closed fracture: Secondary | ICD-10-CM | POA: Diagnosis not present

## 2017-12-19 DIAGNOSIS — S62001A Unspecified fracture of navicular [scaphoid] bone of right wrist, initial encounter for closed fracture: Secondary | ICD-10-CM | POA: Insufficient documentation

## 2017-12-19 DIAGNOSIS — S199XXA Unspecified injury of neck, initial encounter: Secondary | ICD-10-CM | POA: Diagnosis not present

## 2017-12-19 DIAGNOSIS — Z743 Need for continuous supervision: Secondary | ICD-10-CM | POA: Diagnosis not present

## 2017-12-19 DIAGNOSIS — S59902A Unspecified injury of left elbow, initial encounter: Secondary | ICD-10-CM | POA: Diagnosis not present

## 2017-12-19 DIAGNOSIS — S62234A Other nondisplaced fracture of base of first metacarpal bone, right hand, initial encounter for closed fracture: Secondary | ICD-10-CM | POA: Insufficient documentation

## 2017-12-19 DIAGNOSIS — S62341A Nondisplaced fracture of base of second metacarpal bone. left hand, initial encounter for closed fracture: Secondary | ICD-10-CM | POA: Diagnosis not present

## 2017-12-19 DIAGNOSIS — W06XXXA Fall from bed, initial encounter: Secondary | ICD-10-CM | POA: Diagnosis not present

## 2017-12-19 DIAGNOSIS — Y9389 Activity, other specified: Secondary | ICD-10-CM | POA: Diagnosis not present

## 2017-12-19 DIAGNOSIS — S6991XA Unspecified injury of right wrist, hand and finger(s), initial encounter: Secondary | ICD-10-CM | POA: Diagnosis present

## 2017-12-19 DIAGNOSIS — I10 Essential (primary) hypertension: Secondary | ICD-10-CM | POA: Diagnosis not present

## 2017-12-19 DIAGNOSIS — S0003XA Contusion of scalp, initial encounter: Secondary | ICD-10-CM | POA: Diagnosis not present

## 2017-12-19 DIAGNOSIS — Z79899 Other long term (current) drug therapy: Secondary | ICD-10-CM | POA: Insufficient documentation

## 2017-12-19 DIAGNOSIS — F0390 Unspecified dementia without behavioral disturbance: Secondary | ICD-10-CM | POA: Insufficient documentation

## 2017-12-19 MED ORDER — MORPHINE SULFATE (PF) 2 MG/ML IV SOLN
2.0000 mg | Freq: Once | INTRAVENOUS | Status: DC
  Filled 2017-12-19: qty 1

## 2017-12-19 MED ORDER — LOSARTAN POTASSIUM 50 MG PO TABS
50.0000 mg | ORAL_TABLET | Freq: Once | ORAL | Status: AC
  Administered 2017-12-19: 50 mg via ORAL
  Filled 2017-12-19 (×2): qty 1

## 2017-12-19 MED ORDER — MORPHINE SULFATE (PF) 2 MG/ML IV SOLN
2.0000 mg | Freq: Once | INTRAVENOUS | Status: AC
  Administered 2017-12-19: 2 mg via INTRAVENOUS

## 2017-12-19 MED ORDER — HYDRALAZINE HCL 20 MG/ML IJ SOLN
10.0000 mg | Freq: Once | INTRAMUSCULAR | Status: AC
  Administered 2017-12-19: 10 mg via INTRAVENOUS
  Filled 2017-12-19: qty 1

## 2017-12-19 NOTE — Discharge Instructions (Addendum)
Ms. Baldwin Crown were found to have multiple fractures of your right wrist which are not life-threatening. You can take tylenol as needed for pain.  To caregiver: Patient is to make an appointment with Dr. Lenon Curt within this week  Address: [address], [place], [place] [address] Phone: [phone]

## 2017-12-19 NOTE — ED Triage Notes (Signed)
Pt here via [facility] from [facility], staff reports a witnessed fall out of bed, pt hit her head and has a hematoma to right forehead. Pt has dementia at baseline and talks occasionally.  Pt is at her baseline per facility.  Pt c/o left shoulder pain.

## 2017-12-19 NOTE — ED Notes (Signed)
Discharge instructions discussed with Pt and PTARt. PTAR verbalized understanding. Pt stable and leaving via stretcher.

## 2017-12-19 NOTE — ED Provider Notes (Signed)
[facility] EMERGENCY DEPARTMENT Provider Note   CSN: [id number] Arrival date & time: 12/19/17  0102     History   Chief Complaint No chief complaint on file.   HPI Virginia Meyer is a 84 y.o. female with CHF, dementia, paroxysmal A. fib on Eliquis, hypertension, depression and anxiety presenting for evaluation of a witnessed fall.  Patient presented via EMS from a skilled nursing facility following a witnessed fall as she tried to get out of bed.  Report states that patient had a head trauma with hematoma on her left forehead.  Per assessment of patient, she denies syncope, chest pain, shortness of breath, palpitation, seizure-like activities, headaches, nausea or vomiting.  Her only complaint now is minimal right shoulder pain.   Past Medical History:  Diagnosis Date  . Anxiety   . Arthritis   . Atrial fibrillation ([place])   . CHF (congestive heart failure) ([place])   . Chronic kidney disease    Acute Kidney Injury - 11/01/17  . COPD (chronic obstructive pulmonary disease) ([place])   . Fall   . GERD (gastroesophageal reflux disease)   . Heart failure ([place])   . HLD (hyperlipidemia)   . HTN (hypertension)   . Hypotension   . Major depressive disorder   . Memory deficit   . Neuropathy   . PVD (peripheral vascular disease) ([place])   . Seizures ([place])   . Stroke [facility])    left sided deficit  . UTI (urinary tract infection)     Patient Active Problem List   Diagnosis Date Noted  . Acute renal failure (ARF) ([place]) 11/01/2017  . Acute metabolic encephalopathy 72/53/6644  . Essential hypertension 11/01/2017  . Hyperglycemia 11/01/2017  . Dementia ([place]) 11/01/2017  . Seizures ([place]) 01/07/2017  . Transient alteration of awareness   . Labile blood pressure   . Hypertensive urgency 10/27/2016  . Decreased responsiveness   . Bradycardia 10/19/2016  . Hypotension 10/19/2016  . Skull lesion 10/19/2016  . History of cerebrovascular accident (CVA) with residual  deficit 10/19/2016  . Personal history of atrial flutter 10/19/2016  . Thoracic aortic atherosclerosis ([place]) 10/19/2016  . Intracranial atherosclerosis 10/19/2016  . Frailty syndrome in geriatric patient 10/19/2016  . AKI (acute kidney injury) ([place]) 10/19/2016  . 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 Syncope, Recurrent 10/18/2016  . Carotid artery disease ([place]) 07/06/2016  . Gait abnormality 05/13/2016  . Cerebrovascular accident (CVA) due to thrombosis of right anterior cerebral artery ([place]) 05/13/2016  . History of heart block 05/07/2016  . Benign labile hypertension 08/21/2015  . Acute CVA (cerebrovascular accident) ([place]) 08/04/2015  . Left hemiparesis ([place]) 08/04/2015  . Primary osteoarthritis of left knee 08/04/2015  . Esophageal reflux 08/04/2015  . Hyperlipidemia LDL goal <70 08/04/2015  . Paroxysmal atrial fibrillation ([place]) 08/04/2015  . PVD (peripheral vascular disease) ([place]) 08/04/2015  . Protein-calorie malnutrition ([place]) 08/04/2015    Past Surgical History:  Procedure Laterality Date  . ABDOMINAL HYSTERECTOMY    . FOOT NEUROMA SURGERY    . PARS PLANA VITRECTOMY Left 12/13/2017   Procedure: COMPLEX  RETINAL DETACHMENT REPAIR WITH VITRECTOMY MEMBRANECTOMY ENDOCAUTERY DRAINAGE OF SUBRETINAL FLUID AIR/FLUID EXCHANGE ENDOLASER PANRETINAL PHOTOCOAGULATION AND PLACEMENT OF SILICONE OIL TAMPONADE , LEFT EYE;  Surgeon: Jalene Mullet, MD;  Location: [facility];  Service: Ophthalmology;  Laterality: Left;  . SPINE SURGERY    . TONSILLECTOMY       OB History   None      Home Medications  Prior to Admission medications   Medication Sig Start Date End Date Taking? Authorizing Provider  ALPRAZolam (XANAX) 0.25 MG tablet Take 0.125 mg by mouth 2 (two) times daily. For anxiety   Yes [provider]  ALPRAZolam (XANAX) 0.25 MG tablet Take 0.25 mg by mouth 2 (two) times daily as needed for anxiety.   Yes [provider]  amiodarone  (PACERONE) 100 MG tablet Take 1 tablet (100 mg total) daily by mouth. 01/09/17  Yes Velna Ochs, MD  apixaban (ELIQUIS) 2.5 MG TABS tablet Take 1 tablet (2.5 mg total) by mouth 2 (two) times daily. Patient taking differently: Take 2.5 mg by mouth 2 (two) times daily. (1000 & 2100) 10/16/15  Yes Rosalin Hawking, MD  atorvastatin (LIPITOR) 20 MG tablet Take 20 mg by mouth daily at 8 pm. (2100)   Yes [provider]  capsaicin (ZOSTRIX) 0.025 % cream Apply 1 application topically at bedtime. (2100) APPLY BILATERAL TO FEET AT BEDTIME FOR PAIN   Yes [provider]  docusate sodium (COLACE) 100 MG capsule Take 100 mg by mouth 2 (two) times daily. (1000 & 2100)   Yes [provider]  gabapentin (NEURONTIN) 100 MG capsule Take 1 capsule (100 mg total) by mouth 2 (two) times daily. 100 mg in the morning and 200 mg at bedtime Patient taking differently: Take 100 mg by mouth 2 (two) times daily. Taking 100mg  in the Am and 200mg  (2 capsules) at bedtime. 10/29/16  Yes Lockamy, Timothy, DO  hydrALAZINE (APRESOLINE) 10 MG tablet Take 10 mg by mouth 3 (three) times daily.   Yes [provider]  levETIRAcetam (KEPPRA) 500 MG tablet Take 1 tablet (500 mg total) 2 (two) times daily by mouth. Patient taking differently: Take 500 mg by mouth 2 (two) times daily. (1000 & 2100) 01/08/17  Yes Velna Ochs, MD  levocetirizine (XYZAL) 5 MG tablet Take 5 mg by mouth at bedtime. (2100)   Yes [provider]  losartan (COZAAR) 50 MG tablet Take 0.5 tablets (25 mg total) by mouth daily. Patient taking differently: Take 25 mg by mouth daily. (1000) 11/03/17  Yes Geradine Girt, DO  meclizine (ANTIVERT) 12.5 MG tablet Take 12.5 mg by mouth every 8 (eight) hours as needed for dizziness. Not to exceed 3 doses/24 hours   Yes [provider]  Melatonin 3 MG TABS Take 3 mg by mouth at bedtime. (2100)   Yes [provider]  Menthol-Zinc Oxide (CALMOSEPTINE) 0.44-20.6 %  OINT Apply 1 application topically See admin instructions. Apply to buttocks topically every shift for redness   Yes [provider]  ofloxacin (OCUFLOX) 0.3 % ophthalmic solution Place 1 drop into the left eye 4 (four) times daily. Duration 7 days Started on 12-15-17 12/15/17  Yes [provider]  omeprazole (PRILOSEC) 20 MG capsule Take 20 mg by mouth daily as needed (for gerd).   Yes [provider]  pramoxine (SARNA SENSITIVE) 1 % LOTN Apply 1 application topically 2 (two) times daily. (0900 & 2100)   Yes [provider]  prednisoLONE acetate (PRED FORTE) 1 % ophthalmic suspension Place 1 drop into the left eye 4 (four) times daily. Duration 7 days. Started on 12-15-17 12/15/17  Yes [provider]  Psyllium 500 MG CAPS Take 500 mg by mouth at bedtime. (2100)   Yes [provider]  sertraline (ZOLOFT) 50 MG tablet Take 50 mg by mouth at bedtime. (2100)   Yes [provider]    Family History Family  History  Problem Relation Age of Onset  . Heart attack Brother   . Stroke Neg Hx   . Neuropathy Neg Hx     Social History Social History   Tobacco Use  . Smoking status: Never Smoker  . Smokeless tobacco: Never Used  Substance Use Topics  . Alcohol use: No    Alcohol/week: 0.0 standard drinks  . Drug use: No     Allergies   Patient has no known allergies.   Review of Systems Review of Systems  Constitutional: Negative.   HENT: Negative.   Eyes: Negative.   Respiratory: Negative.   Cardiovascular: Negative.   Gastrointestinal: Negative.   Endocrine: Negative.   Genitourinary: Negative.   Musculoskeletal: Positive for arthralgias (right shoulder pain ).  Skin: Negative.   Neurological: Negative.   Psychiatric/Behavioral: Negative.      Physical Exam Updated Vital Signs BP (!) 117/53   Pulse 79   Temp 98.7 F (37.1 C) (Oral)   Resp 18   Ht 5\' 1"  (1.549 m)   Wt 54 kg   SpO2 94%   BMI 22.49 kg/m    Physical Exam  Constitutional: She is oriented to person, place, and time. She appears well-developed and well-nourished.  Conversational, speaking full sentences, alert and oriented to place time and person.  HENT:  Head: Normocephalic.  Left forehead hematoma  Eyes: Conjunctivae are normal.  Cardiovascular: Normal rate and regular rhythm.  Murmur (systolic murmur) heard. Pulmonary/Chest: Effort normal and breath sounds normal.  Abdominal: Soft. Bowel sounds are normal.  Musculoskeletal: Normal range of motion. She exhibits no edema, tenderness or deformity (possible deformity of the L. shoulder, this may be chronic. Also with lipoma of the left shoulder. Normal ROM).  Neurological: She is alert and oriented to person, place, and time.  Skin: Skin is warm.  Psychiatric: She has a normal mood and affect. Her behavior is normal. Judgment and thought content normal.     ED Treatments / Results  Labs (all labs ordered are listed, but only abnormal results are displayed) Labs Reviewed - No data to display  EKG None  Radiology Dg Elbow Complete Right  Result Date: 12/19/2017 CLINICAL DATA:  84 year old female status post fall out of bed. EXAM: RIGHT ELBOW - COMPLETE 3+ VIEW COMPARISON:  None. FINDINGS: Mild artifact from IV access. Bone mineralization is within normal limits for age. Preserved joint spaces and alignment. No definite joint effusion. The radial head appears intact. No acute osseous abnormality identified. IMPRESSION: No acute fracture or dislocation identified about the right elbow. Electronically Signed   By: Genevie Ann M.D.   On: 12/19/2017 10:56   Dg Wrist Complete Right  Result Date: 12/19/2017 CLINICAL DATA:  84 year old female with a history of fall with pain and swelling EXAM: RIGHT WRIST - COMPLETE 3+ VIEW COMPARISON:  None. FINDINGS: Osteopenia. Nondisplaced fractures at the base of the first, second, third metacarpals. Fracture of the trapezium. Fracture of the  scaphoid. Fracture of the triquetrum is suspected on the lateral view. Soft tissue swelling on the dorsum of the hand. Fracture of the proximal phalanx of the second digit visualized on the oblique image. IMPRESSION: Acute nondisplaced fracture of the first, second, third metacarpals as well as the proximal phalanx of the second digit. Acute fracture of the trapezium, scaphoid, and likely the triquetrum. Soft tissue swelling. Osteopenia. Electronically Signed   By: Corrie Mckusick D.O.   On: 12/19/2017 10:58   Ct Head Wo Contrast  Result Date: 12/19/2017 CLINICAL DATA:  Head trauma and ataxia EXAM: CT HEAD WITHOUT CONTRAST CT CERVICAL SPINE WITHOUT CONTRAST TECHNIQUE: Multidetector CT imaging of the head and cervical spine was performed following the standard protocol without intravenous contrast. Multiplanar CT image reconstructions of the cervical spine were also generated. COMPARISON:  11/27/2017 FINDINGS: CT HEAD FINDINGS [place]: No evidence of acute infarction, hemorrhage, hydrocephalus, extra-axial collection or mass lesion/mass effect. Large remote right MCA territory infarct. Confluent low-density in the cerebral white matter from chronic small vessel ischemia. Small remote cerebellar and left thalamic infarcts. Cerebral volume loss with ventriculomegaly. Vascular: No hyperdense vessel or unexpected calcification. Skull: Limited extent impacted fracture of the outer cortex of the left frontal bone overlying a hemangioma. This fracture is subjacent to a scalp hematoma. Sinuses/Orbits: Newly seen high density of the left globe from interval vitrectomy. CT CERVICAL SPINE FINDINGS Alignment: No traumatic malalignment. Skull base and vertebrae: Negative for fracture Soft tissues and spinal canal: No prevertebral fluid or swelling. No visible canal hematoma. Disc levels:  Generalized disc narrowing and facet spurring. Upper chest: Negative IMPRESSION: 1. No evidence of intracranial or cervical spine injury. 2.  Left frontal scalp hematoma with underlying external table fracture at the level of a hemangioma. 3. Advanced chronic ischemic injury. Electronically Signed   By: Monte Fantasia M.D.   On: 12/19/2017 12:14   Ct Cervical Spine Wo Contrast  Result Date: 12/19/2017 CLINICAL DATA:  Head trauma and ataxia EXAM: CT HEAD WITHOUT CONTRAST CT CERVICAL SPINE WITHOUT CONTRAST TECHNIQUE: Multidetector CT imaging of the head and cervical spine was performed following the standard protocol without intravenous contrast. Multiplanar CT image reconstructions of the cervical spine were also generated. COMPARISON:  11/27/2017 FINDINGS: CT HEAD FINDINGS [place]: No evidence of acute infarction, hemorrhage, hydrocephalus, extra-axial collection or mass lesion/mass effect. Large remote right MCA territory infarct. Confluent low-density in the cerebral white matter from chronic small vessel ischemia. Small remote cerebellar and left thalamic infarcts. Cerebral volume loss with ventriculomegaly. Vascular: No hyperdense vessel or unexpected calcification. Skull: Limited extent impacted fracture of the outer cortex of the left frontal bone overlying a hemangioma. This fracture is subjacent to a scalp hematoma. Sinuses/Orbits: Newly seen high density of the left globe from interval vitrectomy. CT CERVICAL SPINE FINDINGS Alignment: No traumatic malalignment. Skull base and vertebrae: Negative for fracture Soft tissues and spinal canal: No prevertebral fluid or swelling. No visible canal hematoma. Disc levels:  Generalized disc narrowing and facet spurring. Upper chest: Negative IMPRESSION: 1. No evidence of intracranial or cervical spine injury. 2. Left frontal scalp hematoma with underlying external table fracture at the level of a hemangioma. 3. Advanced chronic ischemic injury. Electronically Signed   By: Monte Fantasia M.D.   On: 12/19/2017 12:14   Dg Shoulder Left  Result Date: 12/19/2017 CLINICAL DATA:  Fall out of bed at  nursing facility, pain in left shoulder, unable to obtain axillary view due to pt pain, HTN, nonsmoker EXAM: LEFT SHOULDER - 2+ VIEW COMPARISON:  None. FINDINGS: No fracture or dislocation. Narrowing of the articular cartilage at the glenohumeral articulation with some subchondral sclerosis in the femoral head and glenoid. Mild diffuse osteopenia. Regional soft tissues unremarkable. IMPRESSION: Osteopenia and glenohumeral DJD.  No acute findings. Electronically Signed   By: Lucrezia Europe M.D.   On: 12/19/2017 10:55   Dg Hand Complete Right  Result Date: 12/19/2017 CLINICAL DATA:  84 year old female fell out of bed. Pain and swelling hand and wrist. EXAM: RIGHT HAND - COMPLETE 3+ VIEW COMPARISON:  Wrist films same  date dictated separately. FINDINGS: Bones are osteopenic. Fracture at the base of the right first, second and third metacarpal. Fracture proximal aspect of the right second and fifth proximal phalanx. Fracture of the trapezium. Widening of the scapholunate interval may indicate ligamentous injury or laxity. Possible fracture distal scaphoid. Difficult to exclude subtle buckle fracture of the dorsal aspect of the distal radius. Prominent soft tissue swelling surrounds the hand greatest dorsal aspect. Prominent osteoarthritis with joint space narrowing right second through fifth proximal interphalangeal joint space and right first through fifth distal interphalangeal joint space. Dense triangle fibrocartilage complex raising possibility of chondrocalcinosis. IMPRESSION: 1. Fracture at the base of the right first, second and third metacarpal. 2. Fracture proximal aspect of the right second and fifth proximal phalanx. 3. Fracture of the trapezium. 4. Widening of the scapholunate interval may indicate ligamentous injury or laxity. Possible fracture distal scaphoid. 5. Difficult to exclude subtle buckle fracture of the dorsal aspect of the distal radius. 6. Prominent soft tissue swelling surrounds the hand  greatest dorsal aspect. 7. Prominent osteoarthritis with joint space narrowing right second through fifth proximal interphalangeal joint space and right first through fifth distal interphalangeal joint space. 8. Bones are osteopenic. 9. Question chondrocalcinosis. Electronically Signed   By: Genia Del M.D.   On: 12/19/2017 11:07    Procedures Procedures (including critical care time)  Medications Ordered in ED Medications  hydrALAZINE (APRESOLINE) injection 10 mg (10 mg Intravenous Given 12/19/17 1302)  losartan (COZAAR) tablet 50 mg (50 mg Oral Given 12/19/17 1301)  morphine 2 MG/ML injection 2 mg (2 mg Intravenous Given 12/19/17 1308)     Initial Impression / Assessment and Plan / ED Course  I have reviewed the triage vital signs and the nursing notes.  Pertinent labs & imaging results that were available during my care of the patient were reviewed by me and considered in my medical decision making (see chart for details).    84 year old pleasant woman with CHF, dementia, paroxysmal A. fib on Eliquis, hypertension, depression and anxiety presenting for evaluation of a witnessed fall.  Her only complaint is pain at the right wrist pain.  On physical exam, patient was alert and oriented to place, time, person and conversational with no signs of focal neurological deficits.  She had normal range of motion of the left and right shoulder.  This seems to be chronic left shoulder deformity as well as lipoma of the left shoulder.   CT head and cervical neck shows with no evidence of intracranial or cervical spine injury, x-ray left shoulder shows no evidence of acute fracture, x-ray right hand shows a fracture at the base of the right first, second and third carpal, fracture proximal right second and fifth phalanx, fracture of trapezium, possible distal scaphoid fracture, x-ray right elbow shows no acute fracture or dislocation, x-ray right wrist shows Acute nondisplaced fracture of the first,  second, third metacarpals as well as the proximal phalanx of the second digit. Acute fracture of the trapezium, scaphoid, and likely the triquetrum.  Patient had a static splint of the second and third digit placed and received morphine for pain control.    Per discussion with hand surgery, patient is to be placed on a removable splint of the right wrist and should follow-up outpatient.  Final Clinical Impressions(s) / ED Diagnoses   Final diagnoses:  Fall, initial encounter  Closed fracture of right wrist, initial encounter    ED Discharge Orders    None  Jean Rosenthal, MD 12/19/17 [place], [place], DO 12/19/17 1538

## 2017-12-19 NOTE — ED Notes (Signed)
Patient transported to X-ray 

## 2017-12-20 DIAGNOSIS — I69852 Hemiplegia and hemiparesis following other cerebrovascular disease affecting left dominant side: Secondary | ICD-10-CM | POA: Diagnosis not present

## 2017-12-20 DIAGNOSIS — E785 Hyperlipidemia, unspecified: Secondary | ICD-10-CM | POA: Diagnosis not present

## 2017-12-20 DIAGNOSIS — I110 Hypertensive heart disease with heart failure: Secondary | ICD-10-CM | POA: Diagnosis not present

## 2017-12-20 DIAGNOSIS — R69 Illness, unspecified: Secondary | ICD-10-CM | POA: Diagnosis not present

## 2017-12-20 DIAGNOSIS — H348192 Central retinal vein occlusion, unspecified eye, stable: Secondary | ICD-10-CM | POA: Diagnosis not present

## 2017-12-20 DIAGNOSIS — S6291XA Unspecified fracture of right wrist and hand, initial encounter for closed fracture: Secondary | ICD-10-CM | POA: Diagnosis not present

## 2017-12-20 DIAGNOSIS — I739 Peripheral vascular disease, unspecified: Secondary | ICD-10-CM | POA: Diagnosis not present

## 2017-12-20 DIAGNOSIS — Z993 Dependence on wheelchair: Secondary | ICD-10-CM | POA: Diagnosis not present

## 2017-12-20 DIAGNOSIS — I480 Paroxysmal atrial fibrillation: Secondary | ICD-10-CM | POA: Diagnosis not present

## 2017-12-20 DIAGNOSIS — I509 Heart failure, unspecified: Secondary | ICD-10-CM | POA: Diagnosis not present

## 2017-12-22 DIAGNOSIS — H40052 Ocular hypertension, left eye: Secondary | ICD-10-CM | POA: Diagnosis not present

## 2017-12-26 DIAGNOSIS — S62320A Displaced fracture of shaft of second metacarpal bone, right hand, initial encounter for closed fracture: Secondary | ICD-10-CM | POA: Diagnosis not present

## 2017-12-26 DIAGNOSIS — S62316A Displaced fracture of base of fifth metacarpal bone, right hand, initial encounter for closed fracture: Secondary | ICD-10-CM | POA: Diagnosis not present

## 2018-01-03 DIAGNOSIS — H34811 Central retinal vein occlusion, right eye, with macular edema: Secondary | ICD-10-CM | POA: Diagnosis not present

## 2018-01-12 DIAGNOSIS — S62640D Nondisplaced fracture of proximal phalanx of right index finger, subsequent encounter for fracture with routine healing: Secondary | ICD-10-CM | POA: Diagnosis not present

## 2018-01-12 DIAGNOSIS — S62320A Displaced fracture of shaft of second metacarpal bone, right hand, initial encounter for closed fracture: Secondary | ICD-10-CM | POA: Diagnosis not present

## 2018-01-12 DIAGNOSIS — S62244D Nondisplaced fracture of shaft of first metacarpal bone, right hand, subsequent encounter for fracture with routine healing: Secondary | ICD-10-CM | POA: Diagnosis not present

## 2018-01-17 DIAGNOSIS — I4891 Unspecified atrial fibrillation: Secondary | ICD-10-CM | POA: Diagnosis not present

## 2018-01-17 DIAGNOSIS — Z79899 Other long term (current) drug therapy: Secondary | ICD-10-CM | POA: Diagnosis not present

## 2018-01-17 DIAGNOSIS — I10 Essential (primary) hypertension: Secondary | ICD-10-CM | POA: Diagnosis not present

## 2018-01-17 DIAGNOSIS — R69 Illness, unspecified: Secondary | ICD-10-CM | POA: Diagnosis not present

## 2018-01-17 DIAGNOSIS — I6789 Other cerebrovascular disease: Secondary | ICD-10-CM | POA: Diagnosis not present

## 2018-01-20 DIAGNOSIS — I69852 Hemiplegia and hemiparesis following other cerebrovascular disease affecting left dominant side: Secondary | ICD-10-CM | POA: Diagnosis not present

## 2018-01-23 DIAGNOSIS — R69 Illness, unspecified: Secondary | ICD-10-CM | POA: Diagnosis not present

## 2018-01-23 DIAGNOSIS — F419 Anxiety disorder, unspecified: Secondary | ICD-10-CM | POA: Diagnosis not present

## 2018-02-14 DIAGNOSIS — L299 Pruritus, unspecified: Secondary | ICD-10-CM | POA: Diagnosis not present

## 2018-02-14 DIAGNOSIS — I69852 Hemiplegia and hemiparesis following other cerebrovascular disease affecting left dominant side: Secondary | ICD-10-CM | POA: Diagnosis not present

## 2018-02-14 DIAGNOSIS — R69 Illness, unspecified: Secondary | ICD-10-CM | POA: Diagnosis not present

## 2018-02-14 DIAGNOSIS — I10 Essential (primary) hypertension: Secondary | ICD-10-CM | POA: Diagnosis not present

## 2018-04-25 DIAGNOSIS — I69852 Hemiplegia and hemiparesis following other cerebrovascular disease affecting left dominant side: Secondary | ICD-10-CM | POA: Diagnosis not present

## 2018-04-25 DIAGNOSIS — K5901 Slow transit constipation: Secondary | ICD-10-CM | POA: Diagnosis not present

## 2018-04-25 DIAGNOSIS — I4891 Unspecified atrial fibrillation: Secondary | ICD-10-CM | POA: Diagnosis not present

## 2018-04-25 DIAGNOSIS — I6789 Other cerebrovascular disease: Secondary | ICD-10-CM | POA: Diagnosis not present

## 2018-04-25 DIAGNOSIS — Z993 Dependence on wheelchair: Secondary | ICD-10-CM | POA: Diagnosis not present

## 2018-09-01 DIAGNOSIS — R69 Illness, unspecified: Secondary | ICD-10-CM | POA: Diagnosis not present

## 2018-09-01 DIAGNOSIS — F0391 Unspecified dementia with behavioral disturbance: Secondary | ICD-10-CM | POA: Diagnosis not present

## 2018-09-01 DIAGNOSIS — F329 Major depressive disorder, single episode, unspecified: Secondary | ICD-10-CM | POA: Diagnosis not present

## 2018-09-27 DIAGNOSIS — F329 Major depressive disorder, single episode, unspecified: Secondary | ICD-10-CM | POA: Diagnosis not present

## 2018-09-27 DIAGNOSIS — R69 Illness, unspecified: Secondary | ICD-10-CM | POA: Diagnosis not present

## 2018-09-27 DIAGNOSIS — F419 Anxiety disorder, unspecified: Secondary | ICD-10-CM | POA: Diagnosis not present

## 2018-10-16 ENCOUNTER — Encounter (HOSPITAL_COMMUNITY): Payer: Medicare HMO

## 2018-10-23 DIAGNOSIS — F0391 Unspecified dementia with behavioral disturbance: Secondary | ICD-10-CM | POA: Diagnosis not present

## 2018-10-23 DIAGNOSIS — R69 Illness, unspecified: Secondary | ICD-10-CM | POA: Diagnosis not present

## 2018-10-23 DIAGNOSIS — F329 Major depressive disorder, single episode, unspecified: Secondary | ICD-10-CM | POA: Diagnosis not present

## 2018-10-25 DIAGNOSIS — F329 Major depressive disorder, single episode, unspecified: Secondary | ICD-10-CM | POA: Diagnosis not present

## 2018-10-25 DIAGNOSIS — F419 Anxiety disorder, unspecified: Secondary | ICD-10-CM | POA: Diagnosis not present

## 2018-10-25 DIAGNOSIS — R69 Illness, unspecified: Secondary | ICD-10-CM | POA: Diagnosis not present

## 2018-11-13 ENCOUNTER — Emergency Department (HOSPITAL_COMMUNITY)

## 2018-11-13 ENCOUNTER — Emergency Department (HOSPITAL_COMMUNITY)
Admission: EM | Admit: 2018-11-13 | Discharge: 2018-11-14 | Disposition: A | Discharge status: Home / Self Care | Attending: Emergency Medicine | Admitting: Emergency Medicine

## 2018-11-13 DIAGNOSIS — I509 Heart failure, unspecified: Secondary | ICD-10-CM | POA: Diagnosis not present

## 2018-11-13 DIAGNOSIS — I130 Hypertensive heart and chronic kidney disease with heart failure and stage 1 through stage 4 chronic kidney disease, or unspecified chronic kidney disease: Secondary | ICD-10-CM | POA: Diagnosis not present

## 2018-11-13 DIAGNOSIS — J449 Chronic obstructive pulmonary disease, unspecified: Secondary | ICD-10-CM | POA: Diagnosis not present

## 2018-11-13 DIAGNOSIS — R69 Illness, unspecified: Secondary | ICD-10-CM | POA: Diagnosis not present

## 2018-11-13 DIAGNOSIS — Z79899 Other long term (current) drug therapy: Secondary | ICD-10-CM | POA: Diagnosis not present

## 2018-11-13 DIAGNOSIS — Y92129 Unspecified place in nursing home as the place of occurrence of the external cause: Secondary | ICD-10-CM | POA: Insufficient documentation

## 2018-11-13 DIAGNOSIS — S51811A Laceration without foreign body of right forearm, initial encounter: Secondary | ICD-10-CM | POA: Insufficient documentation

## 2018-11-13 DIAGNOSIS — N189 Chronic kidney disease, unspecified: Secondary | ICD-10-CM | POA: Diagnosis not present

## 2018-11-13 DIAGNOSIS — W19XXXA Unspecified fall, initial encounter: Secondary | ICD-10-CM | POA: Insufficient documentation

## 2018-11-13 DIAGNOSIS — Z8673 Personal history of transient ischemic attack (TIA), and cerebral infarction without residual deficits: Secondary | ICD-10-CM | POA: Diagnosis not present

## 2018-11-13 DIAGNOSIS — S0990XA Unspecified injury of head, initial encounter: Secondary | ICD-10-CM | POA: Diagnosis present

## 2018-11-13 DIAGNOSIS — Y939 Activity, unspecified: Secondary | ICD-10-CM | POA: Diagnosis not present

## 2018-11-13 DIAGNOSIS — F0390 Unspecified dementia without behavioral disturbance: Secondary | ICD-10-CM | POA: Insufficient documentation

## 2018-11-13 DIAGNOSIS — Y999 Unspecified external cause status: Secondary | ICD-10-CM | POA: Insufficient documentation

## 2018-11-13 DIAGNOSIS — Z7901 Long term (current) use of anticoagulants: Secondary | ICD-10-CM | POA: Insufficient documentation

## 2018-11-13 DIAGNOSIS — R58 Hemorrhage, not elsewhere classified: Secondary | ICD-10-CM | POA: Diagnosis not present

## 2018-11-13 DIAGNOSIS — R404 Transient alteration of awareness: Secondary | ICD-10-CM | POA: Diagnosis not present

## 2018-11-13 MED ORDER — HYDRALAZINE HCL 10 MG PO TABS
10.0000 mg | ORAL_TABLET | Freq: Once | ORAL | Status: AC
  Administered 2018-11-13: 10 mg via ORAL
  Filled 2018-11-13: qty 1

## 2018-11-13 MED ORDER — LOSARTAN POTASSIUM 50 MG PO TABS
50.0000 mg | ORAL_TABLET | Freq: Every day | ORAL | Status: DC
  Administered 2018-11-13: 50 mg via ORAL
  Filled 2018-11-13: qty 1

## 2018-11-13 NOTE — ED Triage Notes (Addendum)
Arrived by EMS from [facility]. Patient had unwitnessed fall. Patient sustained LAC to back of head and skin tear to right elbow. Hospice pt/ DNR.

## 2018-11-13 NOTE — Discharge Instructions (Signed)
[facility]' CT scan of the Virginia Meyer did not show signs of Virginia Meyer bleed or skull fracture.  I examined her wounds and determine there is no significant wound to warrant staples.  You can keep her head dry for 24 hours to allow for clotting to finish, and then perform gentle rinse or hair wash.  Please keep her elbow wound bandaged for 24 hours, then remove the bandage and let it open to air.  She does not need stitches on this wound.  Per Virginia Pavlov' present wishes to be on hospice, we made every effort to minimize her pain and time in the hospital, and to avoid unnecessary medical tests and workup.

## 2018-11-13 NOTE — ED Provider Notes (Signed)
[facility] DEPT Provider Note   CSN: [id number] Arrival date & time: 11/13/18  1957     History   Chief Complaint Chief Complaint  Patient presents with  . Fall    HPI Virginia Meyer is a 85 y.o. female who is on hospice care presenting to the Ed with unwitnessed fall.  Coming from [facility].  Patient has some element of advanced dementia and has difficulty answering my questions appropriately.  She appears unhappy and keeps asking me, "What is it now?"    HPI  Past Medical History:  Diagnosis Date  . Anxiety   . Arthritis   . Atrial fibrillation ([place])   . CHF (congestive heart failure) ([place])   . Chronic kidney disease    Acute Kidney Injury - 11/01/17  . COPD (chronic obstructive pulmonary disease) ([place])   . Fall   . GERD (gastroesophageal reflux disease)   . Heart failure ([place])   . HLD (hyperlipidemia)   . HTN (hypertension)   . Hypotension   . Major depressive disorder   . Memory deficit   . Neuropathy   . PVD (peripheral vascular disease) ([place])   . Seizures ([place])   . Stroke [facility])    left sided deficit  . UTI (urinary tract infection)     Patient Active Problem List   Diagnosis Date Noted  . Acute renal failure (ARF) ([place]) 11/01/2017  . Acute metabolic encephalopathy [id number]  . Essential hypertension 11/01/2017  . Hyperglycemia 11/01/2017  . Dementia ([place]) 11/01/2017  . Seizures ([place]) 01/07/2017  . Transient alteration of awareness   . Labile blood pressure   . Hypertensive urgency 10/27/2016  . Decreased responsiveness   . Bradycardia 10/19/2016  . Hypotension 10/19/2016  . Skull lesion 10/19/2016  . History of cerebrovascular accident (CVA) with residual deficit 10/19/2016  . Personal history of atrial flutter 10/19/2016  . Thoracic aortic atherosclerosis ([place]) 10/19/2016  . Intracranial atherosclerosis 10/19/2016  . Frailty syndrome in geriatric patient 10/19/2016  . AKI (acute kidney injury) ([place])  10/19/2016  . 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 Syncope, Recurrent 10/18/2016  . Carotid artery disease ([place]) 07/06/2016  . Gait abnormality 05/13/2016  . Cerebrovascular accident (CVA) due to thrombosis of right anterior cerebral artery ([place]) 05/13/2016  . History of heart block 05/07/2016  . Benign labile hypertension 08/21/2015  . Acute CVA (cerebrovascular accident) ([place]) 08/04/2015  . Left hemiparesis ([place]) 08/04/2015  . Primary osteoarthritis of left knee 08/04/2015  . Esophageal reflux 08/04/2015  . Hyperlipidemia LDL goal <70 08/04/2015  . Paroxysmal atrial fibrillation ([place]) 08/04/2015  . PVD (peripheral vascular disease) ([place]) 08/04/2015  . Protein-calorie malnutrition ([place]) 08/04/2015    Past Surgical History:  Procedure Laterality Date  . ABDOMINAL HYSTERECTOMY    . FOOT NEUROMA SURGERY    . PARS PLANA VITRECTOMY Left 12/13/2017   Procedure: COMPLEX  RETINAL DETACHMENT REPAIR WITH VITRECTOMY MEMBRANECTOMY ENDOCAUTERY DRAINAGE OF SUBRETINAL FLUID AIR/FLUID EXCHANGE ENDOLASER PANRETINAL PHOTOCOAGULATION AND PLACEMENT OF SILICONE OIL TAMPONADE , LEFT EYE;  Surgeon: Jalene Mullet, MD;  Location: [facility];  Service: Ophthalmology;  Laterality: Left;  . SPINE SURGERY    . TONSILLECTOMY       OB History   No obstetric history on file.      Home Medications    Prior to Admission medications   Medication Sig Start Date End Date Taking? Authorizing Provider  ALPRAZolam (XANAX) 0.25 MG tablet Take 0.125 mg by mouth 2 (two)  times daily. For anxiety    [provider]  ALPRAZolam (XANAX) 0.25 MG tablet Take 0.25 mg by mouth 2 (two) times daily as needed for anxiety.    [provider]  amiodarone (PACERONE) 100 MG tablet Take 1 tablet (100 mg total) daily by mouth. 01/09/17   Velna Ochs, MD  apixaban (ELIQUIS) 2.5 MG TABS tablet Take 1 tablet (2.5 mg total) by mouth 2 (two) times daily. Patient taking differently: Take  2.5 mg by mouth 2 (two) times daily. (1000 & 2100) 10/16/15   Rosalin Hawking, MD  atorvastatin (LIPITOR) 20 MG tablet Take 20 mg by mouth daily at 8 pm. (2100)    [provider]  capsaicin (ZOSTRIX) 0.025 % cream Apply 1 application topically at bedtime. (2100) APPLY BILATERAL TO FEET AT BEDTIME FOR PAIN    [provider]  docusate sodium (COLACE) 100 MG capsule Take 100 mg by mouth 2 (two) times daily. (1000 & 2100)    [provider]  gabapentin (NEURONTIN) 100 MG capsule Take 1 capsule (100 mg total) by mouth 2 (two) times daily. 100 mg in the morning and 200 mg at bedtime Patient taking differently: Take 100 mg by mouth 2 (two) times daily. Taking 100mg  in the Am and 200mg  (2 capsules) at bedtime. 10/29/16   Nuala Alpha, DO  hydrALAZINE (APRESOLINE) 10 MG tablet Take 10 mg by mouth 3 (three) times daily.    [provider]  levETIRAcetam (KEPPRA) 500 MG tablet Take 1 tablet (500 mg total) 2 (two) times daily by mouth. Patient taking differently: Take 500 mg by mouth 2 (two) times daily. (1000 & 2100) 01/08/17   Velna Ochs, MD  levocetirizine (XYZAL) 5 MG tablet Take 5 mg by mouth at bedtime. (2100)    [provider]  losartan (COZAAR) 50 MG tablet Take 0.5 tablets (25 mg total) by mouth daily. Patient taking differently: Take 25 mg by mouth daily. (1000) 11/03/17   Geradine Girt, DO  meclizine (ANTIVERT) 12.5 MG tablet Take 12.5 mg by mouth every 8 (eight) hours as needed for dizziness. Not to exceed 3 doses/24 hours    [provider]  Melatonin 3 MG TABS Take 3 mg by mouth at bedtime. (2100)    [provider]  Menthol-Zinc Oxide (CALMOSEPTINE) 0.44-20.6 % OINT Apply 1 application topically See admin instructions. Apply to buttocks topically every shift for redness    [provider]  ofloxacin (OCUFLOX) 0.3 % ophthalmic solution Place 1 drop into the left eye 4 (four) times daily. Duration 7 days Started on  12-15-17 12/15/17   [provider]  omeprazole (PRILOSEC) 20 MG capsule Take 20 mg by mouth daily as needed (for gerd).    [provider]  pramoxine (SARNA SENSITIVE) 1 % LOTN Apply 1 application topically 2 (two) times daily. (0900 & 2100)    [provider]  prednisoLONE acetate (PRED FORTE) 1 % ophthalmic suspension Place 1 drop into the left eye 4 (four) times daily. Duration 7 days. Started on 12-15-17 12/15/17   [provider]  Psyllium 500 MG CAPS Take 500 mg by mouth at bedtime. (2100)    [provider]  sertraline (ZOLOFT) 50 MG tablet Take 50 mg by mouth at bedtime. (2100)    [provider]    Family History Family History  Problem Relation Age of Onset  . Heart attack Brother   . Stroke Neg Hx   . Neuropathy Neg Hx  Social History Social History   Tobacco Use  . Smoking status: Never Smoker  . Smokeless tobacco: Never Used  Substance Use Topics  . Alcohol use: No    Alcohol/week: 0.0 standard drinks  . Drug use: No     Allergies   Patient has no known allergies.   Review of Systems Review of Systems  Unable to perform ROS: Dementia     Physical Exam Updated Vital Signs BP (!) 208/73   Pulse 81   Resp (!) 21   SpO2 98%   Physical Exam Vitals signs and nursing note reviewed.  Constitutional:      General: She is not in acute distress.    Appearance: She is well-developed.  HENT:     Head: Normocephalic. No raccoon eyes or Battle's sign.     Comments: Superficial laceration to the occiput of the scalp, not actively bleeding, clotted off Eyes:     Conjunctiva/sclera: Conjunctivae normal.  Neck:     Musculoskeletal: Neck supple.  Cardiovascular:     Rate and Rhythm: Normal rate and regular rhythm.     Pulses: Normal pulses.  Pulmonary:     Effort: Pulmonary effort is normal. No respiratory distress.  Abdominal:     Palpations: Abdomen is soft.     Tenderness: There is no abdominal  tenderness.  Musculoskeletal:     Comments: Contractured lower extremities (chronic) Full ROM at the right elbow  Skin:    General: Skin is warm and dry.     Comments: Small linear laceration to proximal right forearm  Neurological:     Mental Status: She is alert.      ED Treatments / Results  Labs (all labs ordered are listed, but only abnormal results are displayed) Labs Reviewed - No data to display  EKG None  Radiology Ct Head Wo Contrast  Result Date: 11/13/2018 CLINICAL DATA:  Fall.  On anticoagulation EXAM: CT HEAD WITHOUT CONTRAST TECHNIQUE: Contiguous axial images were obtained from the base of the skull through the vertex without intravenous contrast. COMPARISON:  11/27/2017 FINDINGS: [place]: Large old right MCA infarct with encephalomalacia, stable. There is atrophy and chronic small vessel disease changes. He No acute intracranial abnormality. Specifically, no hemorrhage, hydrocephalus, mass lesion, acute infarction, or significant intracranial injury. Vascular: No hyperdense vessel or unexpected calcification. Skull: No acute calvarial abnormality. Sinuses/Orbits: Visualized paranasal sinuses and mastoids clear. Orbital soft tissues unremarkable. Other: None IMPRESSION: Old large right MCA infarct with encephalomalacia. No acute intracranial abnormality. Electronically Signed   By: Rolm Baptise M.D.   On: 11/13/2018 22:14    Procedures Procedures (including critical care time)  Medications Ordered in ED Medications  losartan (COZAAR) tablet 50 mg (50 mg Oral Given 11/13/18 2246)  hydrALAZINE (APRESOLINE) tablet 10 mg (10 mg Oral Given 11/13/18 2247)     Initial Impression / Assessment and Plan / ED Course  I have reviewed the triage vital signs and the nursing notes.  Pertinent labs & imaging results that were available during my care of the patient were reviewed by me and considered in my medical decision making (see chart for details).  Patient is 85 years old  with dementia presenting from nursing facility after a fall here with minor head trauma.  She also has very small laceration near her right elbow.  The patient is DNR and on hospice care.  I believe it is reasonable to obtain a CT scan of her head but otherwise wished to minimize unnecessary work-ups at this time.  I do not believe she needs staples in her scalp laceration, as it appears superficial and was no longer bleeding.  If CTH is negative we can discharge back to facility.  Clinical Course as of Nov 14 26  Mon Nov 13, 2018  2224 No acute intracranial bleed   [MT]    Clinical Course User Index [MT] Wyvonnia Dusky, MD     Final Clinical Impressions(s) / ED Diagnoses   Final diagnoses:  Fall, initial encounter  Injury of head, initial encounter  Laceration of right forearm, initial encounter    ED Discharge Orders    None       Milind Raether, Carola Rhine, MD 11/14/18 0028

## 2018-11-14 DIAGNOSIS — M2550 Pain in unspecified joint: Secondary | ICD-10-CM | POA: Diagnosis not present

## 2018-11-14 DIAGNOSIS — I10 Essential (primary) hypertension: Secondary | ICD-10-CM | POA: Diagnosis not present

## 2018-11-14 DIAGNOSIS — R0902 Hypoxemia: Secondary | ICD-10-CM | POA: Diagnosis not present

## 2018-11-14 DIAGNOSIS — Z7401 Bed confinement status: Secondary | ICD-10-CM | POA: Diagnosis not present

## 2018-11-29 DIAGNOSIS — F329 Major depressive disorder, single episode, unspecified: Secondary | ICD-10-CM | POA: Diagnosis not present

## 2018-11-29 DIAGNOSIS — F419 Anxiety disorder, unspecified: Secondary | ICD-10-CM | POA: Diagnosis not present

## 2018-11-29 DIAGNOSIS — R69 Illness, unspecified: Secondary | ICD-10-CM | POA: Diagnosis not present

## 2018-12-08 DIAGNOSIS — R69 Illness, unspecified: Secondary | ICD-10-CM | POA: Diagnosis not present

## 2018-12-27 DIAGNOSIS — F329 Major depressive disorder, single episode, unspecified: Secondary | ICD-10-CM | POA: Diagnosis not present

## 2018-12-27 DIAGNOSIS — R69 Illness, unspecified: Secondary | ICD-10-CM | POA: Diagnosis not present

## 2018-12-27 DIAGNOSIS — F419 Anxiety disorder, unspecified: Secondary | ICD-10-CM | POA: Diagnosis not present

## 2019-01-24 DIAGNOSIS — F419 Anxiety disorder, unspecified: Secondary | ICD-10-CM | POA: Diagnosis not present

## 2019-01-24 DIAGNOSIS — F329 Major depressive disorder, single episode, unspecified: Secondary | ICD-10-CM | POA: Diagnosis not present

## 2019-01-24 DIAGNOSIS — R69 Illness, unspecified: Secondary | ICD-10-CM | POA: Diagnosis not present

## 2019-02-23 DEATH — deceased

## 2019-12-26 IMAGING — DX DG CHEST 2V
2 series · 2 of 2 positions shown · non-contrast
Comparison: Prior chest x-ray 08/31/2017

CLINICAL DATA: 88-year-old female with altered mental status for
the past 2 days

EXAM:
CHEST - 2 VIEW

[chest lat]
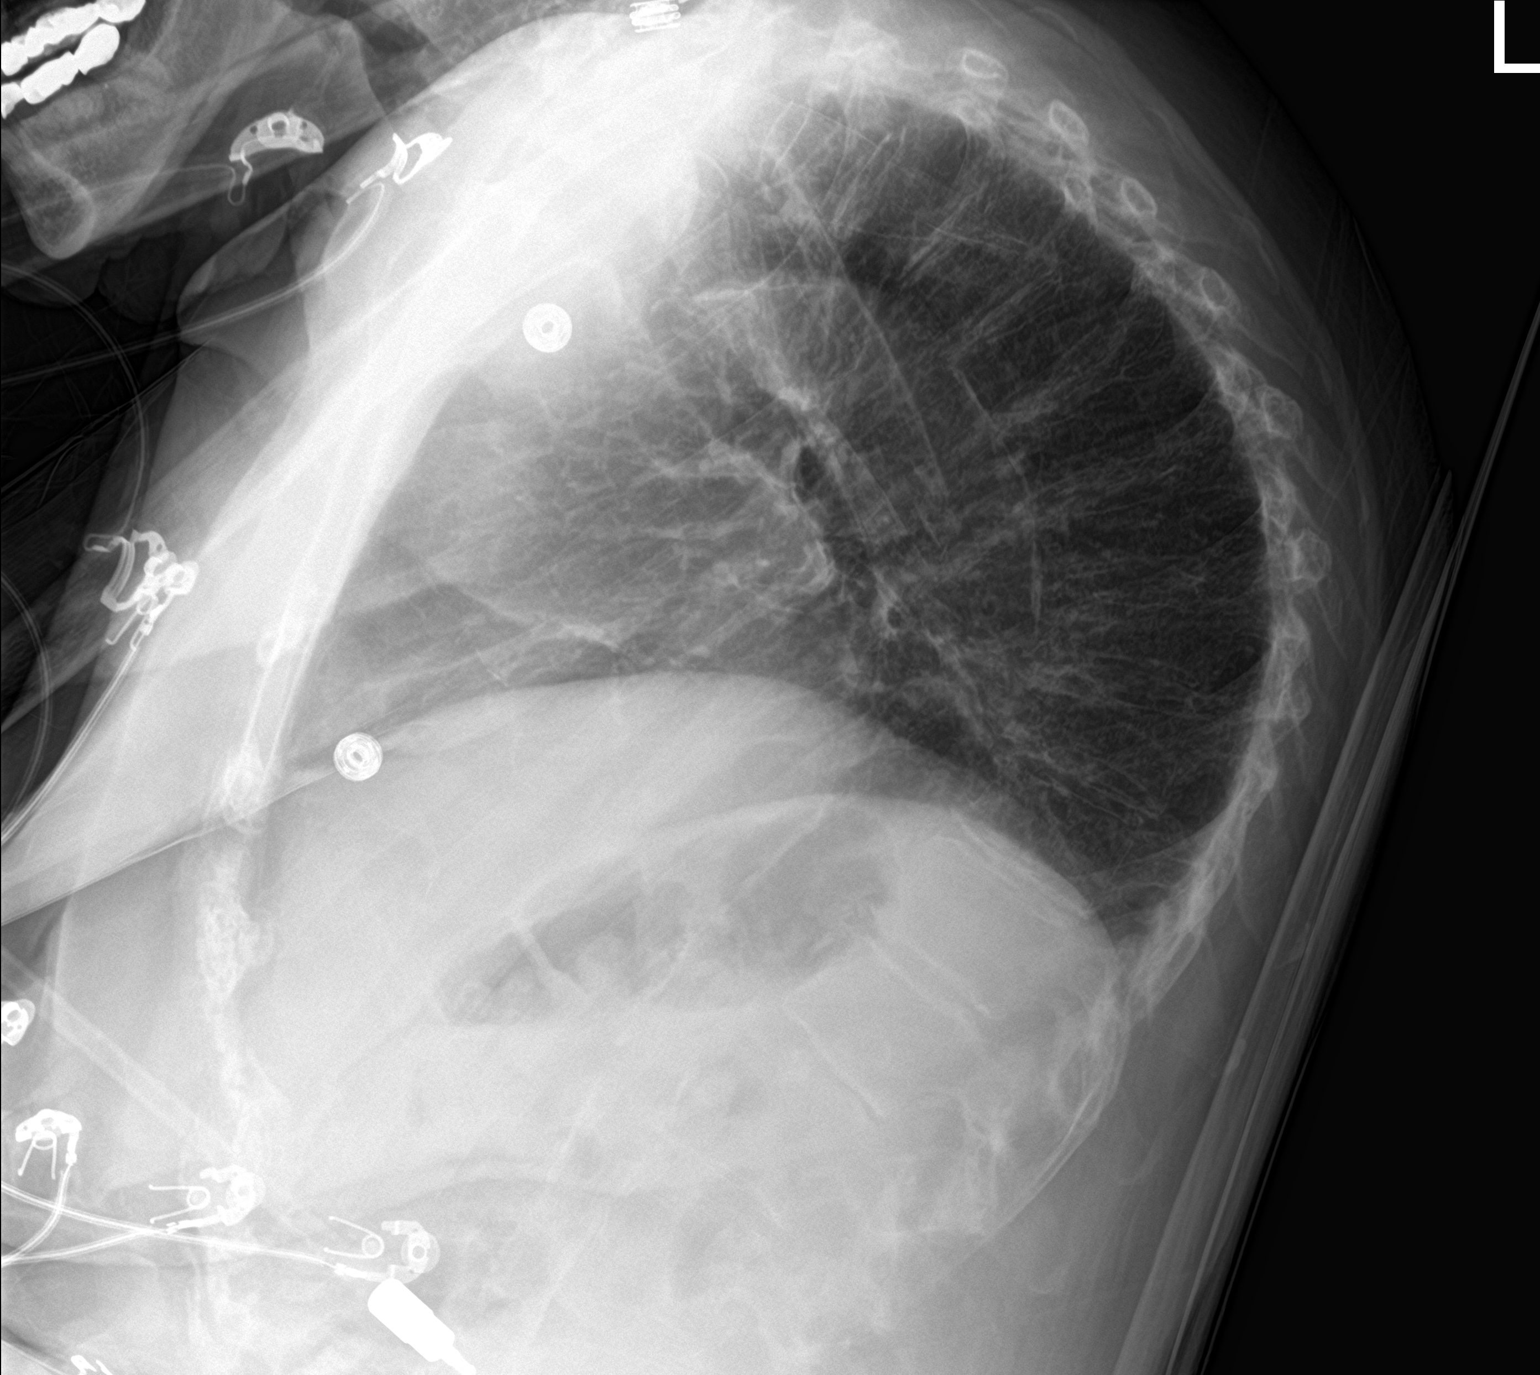

[chest ap]
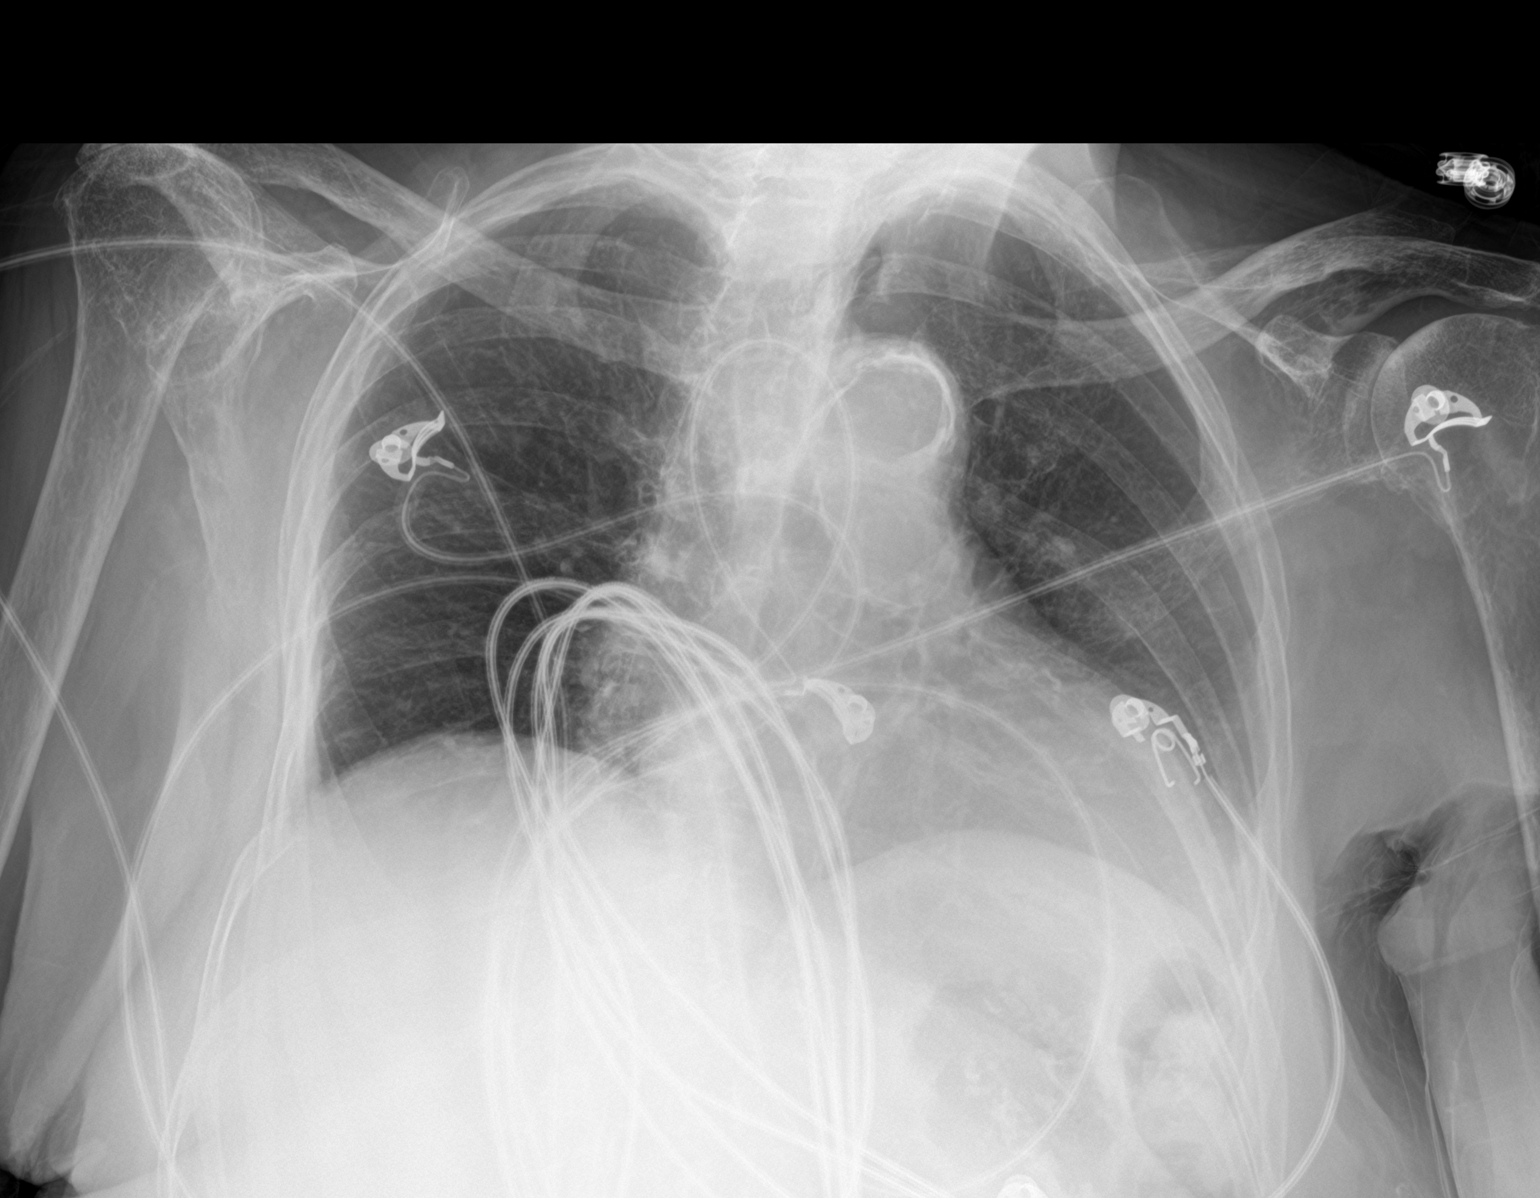

[2 of 2 positions shown; findings below may reference images not displayed]

FINDINGS: Low inspiratory volumes. Cardiac and mediastinal contours are
unchanged. Atherosclerotic calcifications are present in the
transverse aorta. No evidence of focal airspace consolidation,
pulmonary edema, pleural effusion or pneumothorax. Stable chronic
bronchitic changes. Degenerative changes present in the right
glenohumeral joint suggesting chronic rotator cuff injury.
IMPRESSION: Stable chest x-ray without evidence of acute cardiopulmonary
process.

Aortic Atherosclerosis ([id number]-170.0)

## 2019-12-26 IMAGING — CT CT HEAD W/O CM
4 series · 16 of 47 positions shown, 18 images · non-contrast
Comparison: 01/06/2017 CT and MRI, CT more on 01/07/2016

CLINICAL DATA: Pt has AMS has been confused for 2 days

EXAM:
CT HEAD WITHOUT CONTRAST
TECHNIQUE: Contiguous axial images were obtained from the base of the skull
through the vertex without intravenous contrast.

[Series 3: head without · axial · non-contrast · 0.40mm/px · z∈[-59,+61]mm · 7 of 33 slices shown, 9 images]
[im 5/33  brain]
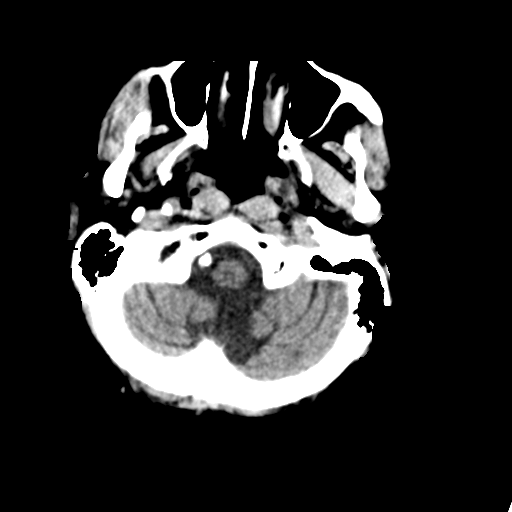
[im 5/33  bone]
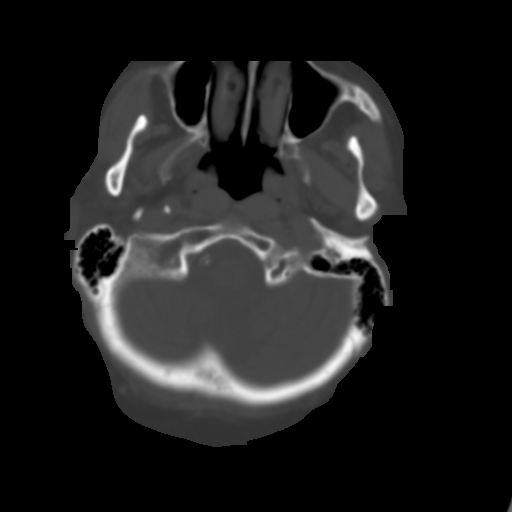
[im 9/33  brain]
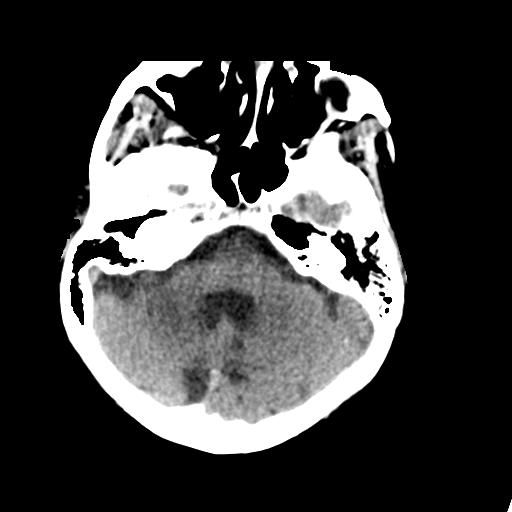
[im 13/33  brain]
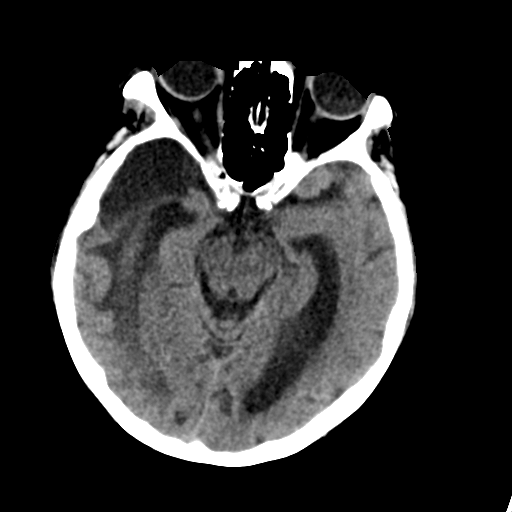
[im 17/33  brain]
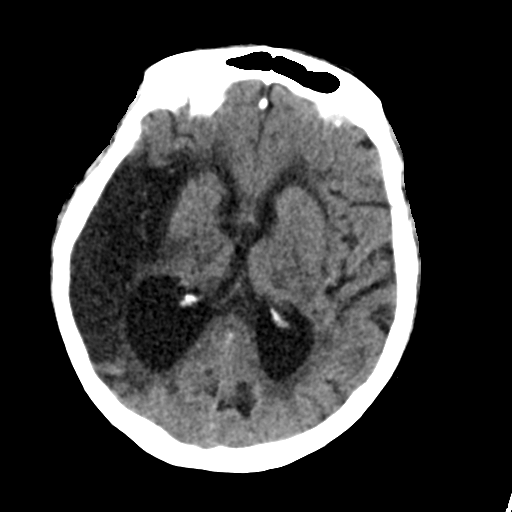
[im 21/33  brain]
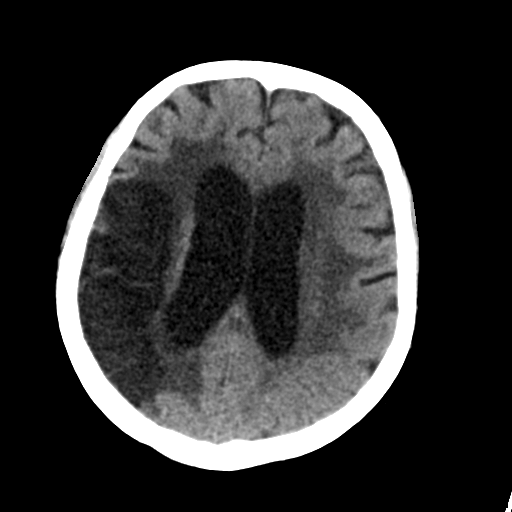
[im 21/33  bone]
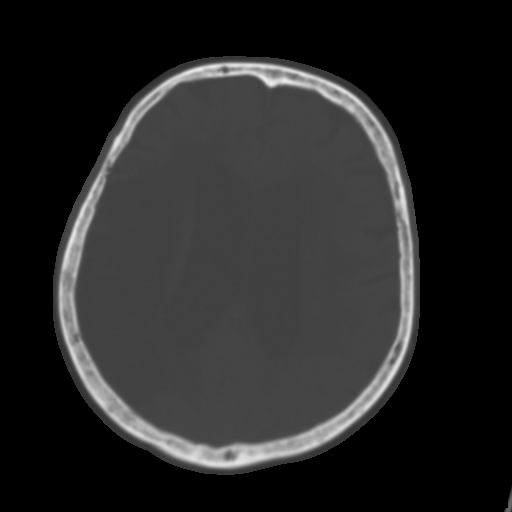
[im 25/33  brain]
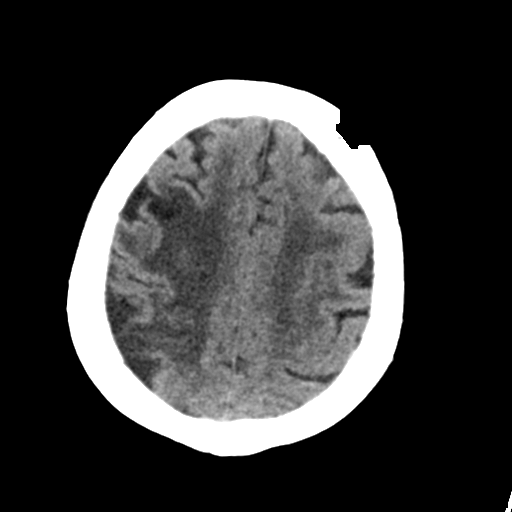
[im 29/33  brain]
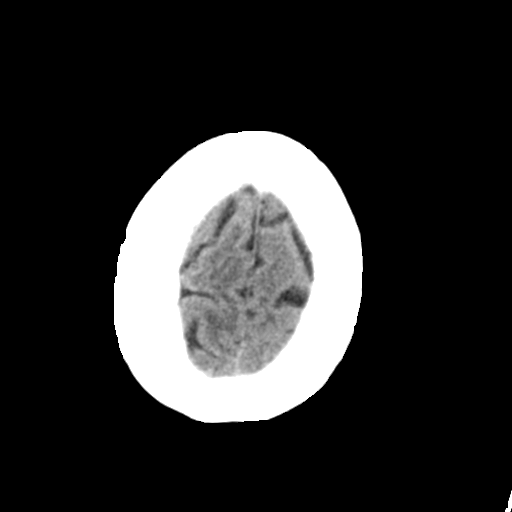

[Series 4: head bone · axial · 0.40mm/px · z∈[-63,-31]mm · 3 of 82 slices shown]
[im 9/82  bone]
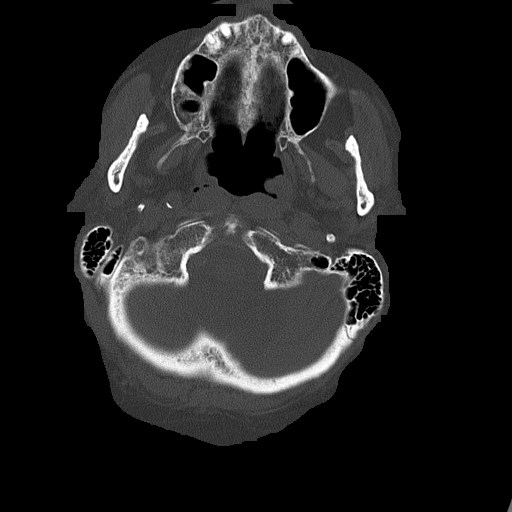
[im 17/82  bone]
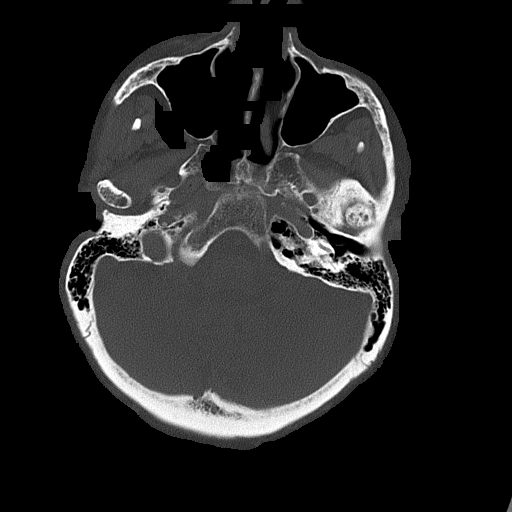
[im 25/82  bone]
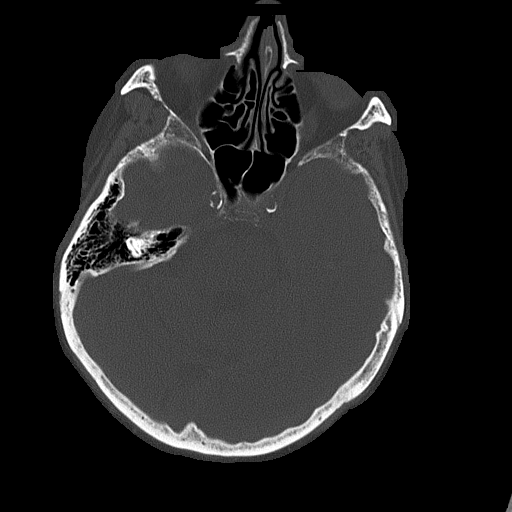

[Series 5: head without cor · coronal · non-contrast · 0.31mm/px · 3 of 67 slices shown]
[im 23/67  brain]
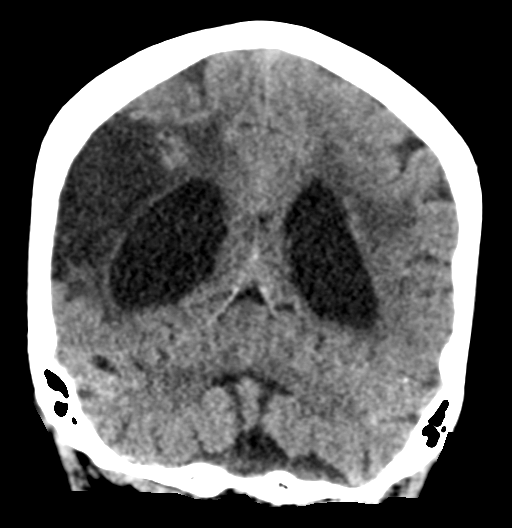
[im 30/67  brain]
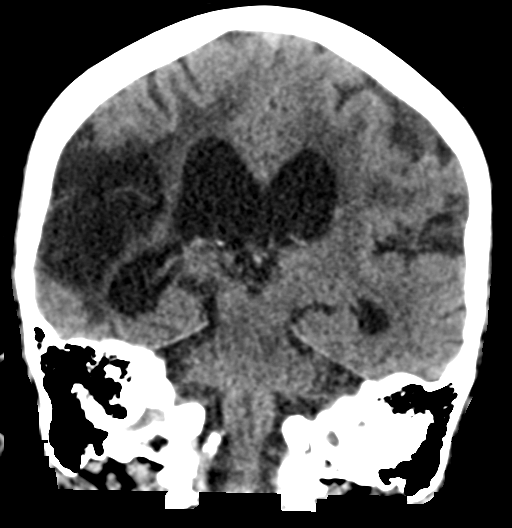
[im 37/67  brain]
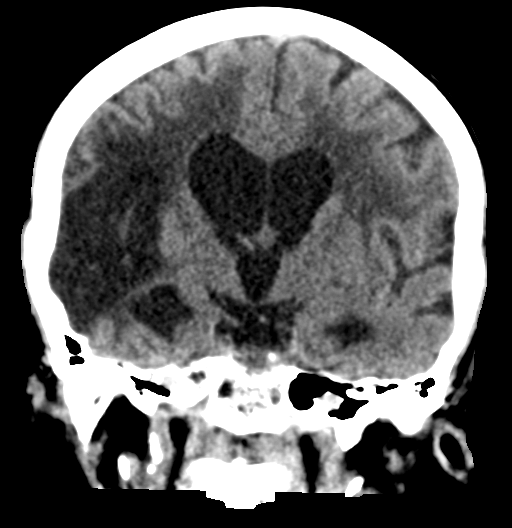

[Series 6: head without sag · sagittal · non-contrast · 0.32mm/px · 3 of 63 slices shown]
[im 21/63  brain]
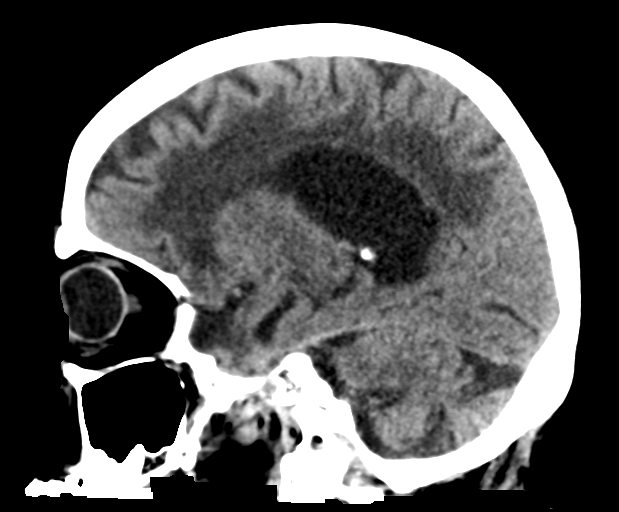
[im 32/63  brain]
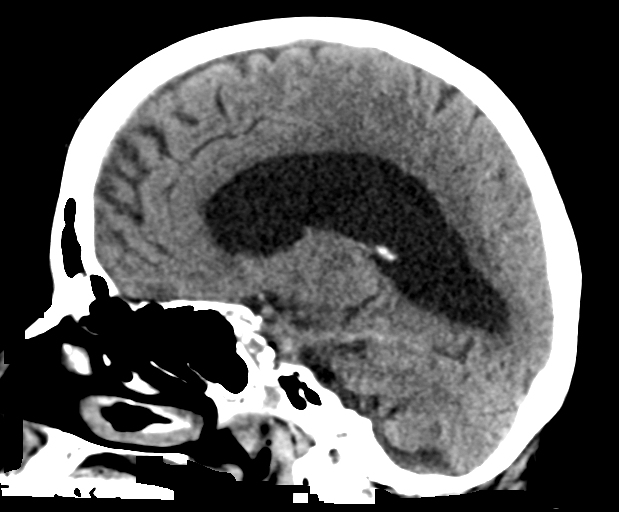
[im 42/63  brain]
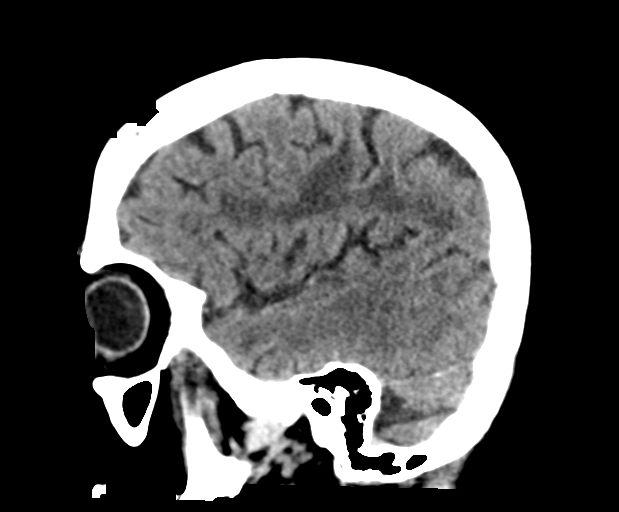

[16 of 47 positions shown; findings below may reference images not displayed]

FINDINGS: Brain: There is significant central and cortical atrophy. Severe
periventricular white matter changes are consistent with small
vessel disease. Large area of encephalomalacia involving the RIGHT
middle cerebral artery distribution appears stable. There is no
intra or extra-axial fluid collection or mass lesion. The basilar
cisterns and ventricles have a normal appearance. There is no CT
evidence for acute infarction or hemorrhage.

Vascular: There is extensive atherosclerotic calcification of the
internal carotid arteries. No hyperdense vessels.

Skull: Lucent lesion in the LEFT frontal bone measures
centimeters, unchanged. The calvarium appears heterogeneous.
Question RIGHT parietal bone lesion, 5 millimeters. Stable
appearance of RIGHT frontal lesion measuring 7 millimeters.

Sinuses/Orbits: Paranasal sinuses are unremarkable. Orbits are
clear.

Other: None
IMPRESSION: 1. Significant atrophy and small vessel disease.
2. Stable appearance of old RIGHT middle cerebral artery infarct.
3.  No evidence for acute intracranial abnormality.
4. Stable calvarial lesions.
